# Patient Record
Sex: Male | Born: 1965 | Race: White | Hispanic: No | Marital: Single | Smoking: Never smoker
Health system: Southern US, Community
[De-identification: ages and names within clinical notes are randomized; demographics above are authoritative.]

## PROBLEM LIST (undated history)

## (undated) DIAGNOSIS — Z87442 Personal history of urinary calculi: Secondary | ICD-10-CM

## (undated) DIAGNOSIS — A5149 Other secondary syphilitic conditions: Secondary | ICD-10-CM

## (undated) DIAGNOSIS — I252 Old myocardial infarction: Secondary | ICD-10-CM

## (undated) DIAGNOSIS — E119 Type 2 diabetes mellitus without complications: Secondary | ICD-10-CM

## (undated) DIAGNOSIS — K829 Disease of gallbladder, unspecified: Secondary | ICD-10-CM

## (undated) DIAGNOSIS — Z21 Asymptomatic human immunodeficiency virus [HIV] infection status: Secondary | ICD-10-CM

## (undated) DIAGNOSIS — E782 Mixed hyperlipidemia: Secondary | ICD-10-CM

## (undated) DIAGNOSIS — E1110 Type 2 diabetes mellitus with ketoacidosis without coma: Secondary | ICD-10-CM

## (undated) DIAGNOSIS — A5274 Syphilis of liver and other viscera: Secondary | ICD-10-CM

## (undated) DIAGNOSIS — E785 Hyperlipidemia, unspecified: Secondary | ICD-10-CM

## (undated) DIAGNOSIS — I219 Acute myocardial infarction, unspecified: Secondary | ICD-10-CM

## (undated) DIAGNOSIS — R7989 Other specified abnormal findings of blood chemistry: Secondary | ICD-10-CM

## (undated) DIAGNOSIS — R945 Abnormal results of liver function studies: Secondary | ICD-10-CM

## (undated) DIAGNOSIS — B20 Human immunodeficiency virus [HIV] disease: Secondary | ICD-10-CM

## (undated) DIAGNOSIS — R9430 Abnormal result of cardiovascular function study, unspecified: Secondary | ICD-10-CM

## (undated) DIAGNOSIS — I2510 Atherosclerotic heart disease of native coronary artery without angina pectoris: Secondary | ICD-10-CM

## (undated) DIAGNOSIS — I10 Essential (primary) hypertension: Secondary | ICD-10-CM

## (undated) DIAGNOSIS — R079 Chest pain, unspecified: Secondary | ICD-10-CM

## (undated) DIAGNOSIS — E1165 Type 2 diabetes mellitus with hyperglycemia: Secondary | ICD-10-CM

## (undated) DIAGNOSIS — R112 Nausea with vomiting, unspecified: Secondary | ICD-10-CM

## (undated) DIAGNOSIS — R000 Tachycardia, unspecified: Secondary | ICD-10-CM

## (undated) HISTORY — DX: Hyperlipidemia, unspecified: E78.5

## (undated) HISTORY — DX: Nausea with vomiting, unspecified: R11.2

## (undated) HISTORY — DX: Acute myocardial infarction, unspecified: I21.9

## (undated) HISTORY — DX: Disease of gallbladder, unspecified: K82.9

## (undated) HISTORY — DX: Old myocardial infarction: I25.2

## (undated) HISTORY — DX: Human immunodeficiency virus (HIV) disease: B20

## (undated) HISTORY — DX: Essential (primary) hypertension: I10

## (undated) HISTORY — DX: Asymptomatic human immunodeficiency virus (hiv) infection status: Z21

## (undated) HISTORY — DX: Type 2 diabetes mellitus with hyperglycemia: E11.65

## (undated) HISTORY — DX: Type 2 diabetes mellitus without complications: E11.9

## (undated) HISTORY — DX: Chest pain, unspecified: R07.9

## (undated) HISTORY — DX: Other secondary syphilitic conditions: A51.49

## (undated) HISTORY — DX: Abnormal results of liver function studies: R94.5

## (undated) HISTORY — DX: Other specified abnormal findings of blood chemistry: R79.89

## (undated) HISTORY — DX: Mixed hyperlipidemia: E78.2

## (undated) HISTORY — DX: Atherosclerotic heart disease of native coronary artery without angina pectoris: I25.10

## (undated) HISTORY — DX: Tachycardia, unspecified: R00.0

## (undated) HISTORY — DX: Abnormal result of cardiovascular function study, unspecified: R94.30

## (undated) HISTORY — PX: HERNIA REPAIR: SHX51

## (undated) HISTORY — DX: Syphilis of liver and other viscera: A52.74

## (undated) HISTORY — PX: CORONARY ANGIOPLASTY WITH STENT PLACEMENT: SHX49

---

## 1998-03-05 ENCOUNTER — Emergency Department (HOSPITAL_COMMUNITY): Admission: EM | Admit: 1998-03-05 | Discharge: 1998-03-05 | Payer: Self-pay | Admitting: Emergency Medicine

## 2000-09-08 ENCOUNTER — Emergency Department (HOSPITAL_COMMUNITY): Admission: EM | Admit: 2000-09-08 | Discharge: 2000-09-08 | Payer: Self-pay | Admitting: *Deleted

## 2000-10-01 ENCOUNTER — Other Ambulatory Visit (HOSPITAL_COMMUNITY): Admission: RE | Admit: 2000-10-01 | Discharge: 2000-10-08 | Payer: Self-pay | Admitting: Psychiatry

## 2001-07-21 ENCOUNTER — Encounter: Payer: Self-pay | Admitting: Gastroenterology

## 2001-07-21 ENCOUNTER — Emergency Department (HOSPITAL_COMMUNITY): Admission: EM | Admit: 2001-07-21 | Discharge: 2001-07-21 | Payer: Self-pay | Admitting: *Deleted

## 2001-07-21 ENCOUNTER — Encounter: Payer: Self-pay | Admitting: Emergency Medicine

## 2001-07-21 ENCOUNTER — Ambulatory Visit (HOSPITAL_COMMUNITY): Admission: RE | Admit: 2001-07-21 | Discharge: 2001-07-21 | Payer: Self-pay | Admitting: Gastroenterology

## 2001-07-22 ENCOUNTER — Encounter (INDEPENDENT_AMBULATORY_CARE_PROVIDER_SITE_OTHER): Payer: Self-pay | Admitting: Specialist

## 2001-07-22 ENCOUNTER — Ambulatory Visit (HOSPITAL_COMMUNITY): Admission: RE | Admit: 2001-07-22 | Discharge: 2001-07-22 | Payer: Self-pay | Admitting: Gastroenterology

## 2001-07-23 HISTORY — PX: LAPAROSCOPIC INCISIONAL / UMBILICAL / VENTRAL HERNIA REPAIR: SUR789

## 2001-07-23 HISTORY — PX: CHOLECYSTECTOMY OPEN: SUR202

## 2001-08-09 ENCOUNTER — Ambulatory Visit (HOSPITAL_COMMUNITY): Admission: RE | Admit: 2001-08-09 | Discharge: 2001-08-09 | Payer: Self-pay | Admitting: Cardiology

## 2001-08-17 ENCOUNTER — Encounter: Payer: Self-pay | Admitting: General Surgery

## 2001-08-19 ENCOUNTER — Encounter: Payer: Self-pay | Admitting: General Surgery

## 2001-08-19 ENCOUNTER — Ambulatory Visit (HOSPITAL_COMMUNITY): Admission: RE | Admit: 2001-08-19 | Discharge: 2001-08-20 | Payer: Self-pay | Admitting: General Surgery

## 2001-08-19 ENCOUNTER — Encounter (INDEPENDENT_AMBULATORY_CARE_PROVIDER_SITE_OTHER): Payer: Self-pay | Admitting: *Deleted

## 2001-10-18 ENCOUNTER — Emergency Department (HOSPITAL_COMMUNITY): Admission: EM | Admit: 2001-10-18 | Discharge: 2001-10-18 | Payer: Self-pay | Admitting: Emergency Medicine

## 2001-10-18 ENCOUNTER — Encounter: Payer: Self-pay | Admitting: Internal Medicine

## 2001-10-19 ENCOUNTER — Encounter: Payer: Self-pay | Admitting: Internal Medicine

## 2001-10-19 ENCOUNTER — Ambulatory Visit (HOSPITAL_COMMUNITY): Admission: RE | Admit: 2001-10-19 | Discharge: 2001-10-19 | Payer: Self-pay | Admitting: Internal Medicine

## 2002-12-15 ENCOUNTER — Encounter: Payer: Self-pay | Admitting: Emergency Medicine

## 2002-12-15 ENCOUNTER — Inpatient Hospital Stay (HOSPITAL_COMMUNITY): Admission: EM | Admit: 2002-12-15 | Discharge: 2002-12-17 | Payer: Self-pay | Admitting: Emergency Medicine

## 2002-12-16 ENCOUNTER — Encounter: Payer: Self-pay | Admitting: Internal Medicine

## 2004-03-28 ENCOUNTER — Ambulatory Visit: Payer: Self-pay | Admitting: Internal Medicine

## 2004-04-09 ENCOUNTER — Emergency Department (HOSPITAL_COMMUNITY): Admission: EM | Admit: 2004-04-09 | Discharge: 2004-04-09 | Payer: Self-pay

## 2004-04-24 ENCOUNTER — Ambulatory Visit: Payer: Self-pay | Admitting: Internal Medicine

## 2004-06-06 ENCOUNTER — Ambulatory Visit: Payer: Self-pay | Admitting: Internal Medicine

## 2004-06-30 ENCOUNTER — Observation Stay (HOSPITAL_COMMUNITY): Admission: AD | Admit: 2004-06-30 | Discharge: 2004-07-01 | Payer: Self-pay | Admitting: Internal Medicine

## 2004-06-30 ENCOUNTER — Ambulatory Visit: Payer: Self-pay | Admitting: Internal Medicine

## 2004-08-07 ENCOUNTER — Ambulatory Visit: Payer: Self-pay | Admitting: Internal Medicine

## 2004-09-01 ENCOUNTER — Inpatient Hospital Stay (HOSPITAL_COMMUNITY): Admission: EM | Admit: 2004-09-01 | Discharge: 2004-09-03 | Payer: Self-pay | Admitting: Emergency Medicine

## 2004-09-12 ENCOUNTER — Ambulatory Visit: Payer: Self-pay | Admitting: Internal Medicine

## 2004-09-23 ENCOUNTER — Emergency Department (HOSPITAL_COMMUNITY): Admission: EM | Admit: 2004-09-23 | Discharge: 2004-09-23 | Payer: Self-pay | Admitting: Emergency Medicine

## 2004-10-09 ENCOUNTER — Ambulatory Visit: Payer: Self-pay | Admitting: Internal Medicine

## 2004-12-04 ENCOUNTER — Ambulatory Visit: Payer: Self-pay | Admitting: Internal Medicine

## 2005-01-05 ENCOUNTER — Ambulatory Visit: Payer: Self-pay | Admitting: Internal Medicine

## 2005-01-08 ENCOUNTER — Inpatient Hospital Stay (HOSPITAL_COMMUNITY): Admission: EM | Admit: 2005-01-08 | Discharge: 2005-01-09 | Payer: Self-pay | Admitting: Emergency Medicine

## 2005-03-12 ENCOUNTER — Ambulatory Visit: Payer: Self-pay | Admitting: Internal Medicine

## 2005-04-09 ENCOUNTER — Inpatient Hospital Stay (HOSPITAL_COMMUNITY): Admission: EM | Admit: 2005-04-09 | Discharge: 2005-04-10 | Payer: Self-pay | Admitting: Emergency Medicine

## 2005-06-13 ENCOUNTER — Emergency Department (HOSPITAL_COMMUNITY): Admission: EM | Admit: 2005-06-13 | Discharge: 2005-06-13 | Payer: Self-pay | Admitting: Emergency Medicine

## 2005-06-14 ENCOUNTER — Inpatient Hospital Stay (HOSPITAL_COMMUNITY): Admission: EM | Admit: 2005-06-14 | Discharge: 2005-06-16 | Payer: Self-pay | Admitting: Emergency Medicine

## 2005-07-21 ENCOUNTER — Ambulatory Visit: Payer: Self-pay | Admitting: "Endocrinology

## 2005-07-22 ENCOUNTER — Ambulatory Visit (HOSPITAL_COMMUNITY): Admission: RE | Admit: 2005-07-22 | Discharge: 2005-07-22 | Payer: Self-pay | Admitting: *Deleted

## 2005-10-01 ENCOUNTER — Ambulatory Visit: Payer: Self-pay | Admitting: "Endocrinology

## 2006-03-21 ENCOUNTER — Inpatient Hospital Stay (HOSPITAL_COMMUNITY): Admission: EM | Admit: 2006-03-21 | Discharge: 2006-03-23 | Payer: Self-pay | Admitting: Emergency Medicine

## 2006-04-08 ENCOUNTER — Inpatient Hospital Stay (HOSPITAL_COMMUNITY): Admission: EM | Admit: 2006-04-08 | Discharge: 2006-04-09 | Payer: Self-pay | Admitting: Emergency Medicine

## 2006-04-08 ENCOUNTER — Ambulatory Visit: Payer: Self-pay | Admitting: Internal Medicine

## 2006-04-27 ENCOUNTER — Ambulatory Visit: Payer: Self-pay | Admitting: Cardiology

## 2006-04-30 ENCOUNTER — Ambulatory Visit (HOSPITAL_COMMUNITY): Admission: RE | Admit: 2006-04-30 | Discharge: 2006-04-30 | Payer: Self-pay | Admitting: Internal Medicine

## 2006-05-04 ENCOUNTER — Ambulatory Visit: Payer: Self-pay | Admitting: Internal Medicine

## 2006-05-07 ENCOUNTER — Ambulatory Visit: Payer: Self-pay

## 2006-05-11 ENCOUNTER — Ambulatory Visit: Payer: Self-pay | Admitting: Cardiology

## 2006-05-12 ENCOUNTER — Ambulatory Visit (HOSPITAL_COMMUNITY): Admission: RE | Admit: 2006-05-12 | Discharge: 2006-05-12 | Payer: Self-pay | Admitting: Internal Medicine

## 2006-05-13 ENCOUNTER — Ambulatory Visit: Payer: Self-pay | Admitting: Internal Medicine

## 2006-05-14 ENCOUNTER — Ambulatory Visit (HOSPITAL_COMMUNITY): Admission: RE | Admit: 2006-05-14 | Discharge: 2006-05-14 | Payer: Self-pay | Admitting: Internal Medicine

## 2006-05-19 ENCOUNTER — Encounter (INDEPENDENT_AMBULATORY_CARE_PROVIDER_SITE_OTHER): Payer: Self-pay | Admitting: Specialist

## 2006-05-19 ENCOUNTER — Ambulatory Visit (HOSPITAL_COMMUNITY): Admission: RE | Admit: 2006-05-19 | Discharge: 2006-05-19 | Payer: Self-pay | Admitting: Internal Medicine

## 2006-05-26 ENCOUNTER — Emergency Department (HOSPITAL_COMMUNITY): Admission: EM | Admit: 2006-05-26 | Discharge: 2006-05-26 | Payer: Self-pay | Admitting: Emergency Medicine

## 2006-08-10 ENCOUNTER — Ambulatory Visit: Payer: Self-pay | Admitting: Cardiology

## 2006-12-30 ENCOUNTER — Ambulatory Visit (HOSPITAL_COMMUNITY): Admission: RE | Admit: 2006-12-30 | Discharge: 2006-12-30 | Payer: Self-pay | Admitting: Internal Medicine

## 2007-11-08 ENCOUNTER — Ambulatory Visit: Payer: Self-pay | Admitting: Cardiology

## 2007-11-08 ENCOUNTER — Encounter: Payer: Self-pay | Admitting: Emergency Medicine

## 2007-11-08 ENCOUNTER — Inpatient Hospital Stay (HOSPITAL_COMMUNITY): Admission: EM | Admit: 2007-11-08 | Discharge: 2007-11-09 | Payer: Self-pay | Admitting: Emergency Medicine

## 2007-11-24 ENCOUNTER — Ambulatory Visit: Payer: Self-pay | Admitting: Cardiology

## 2007-12-30 IMAGING — CT CT CHEST W/O CM
1 series · 16 of 33 positions shown, 20 images · IV contrast (agent unspecified)
Comparison: none

CLINICAL DATA: Follow-up lung nodules. 
CHEST CT WITHOUT CONTRAST:
TECHNIQUE: Multidetector CT imaging of the chest was performed following the standard protocol without IV contrast.

[Series 2: chestroutine 5.0 b40f · axial · 0.77mm/px · z∈[-330,-45]mm · 16 of 63 slices shown, 20 images]
[im 3/63  mediastinal]
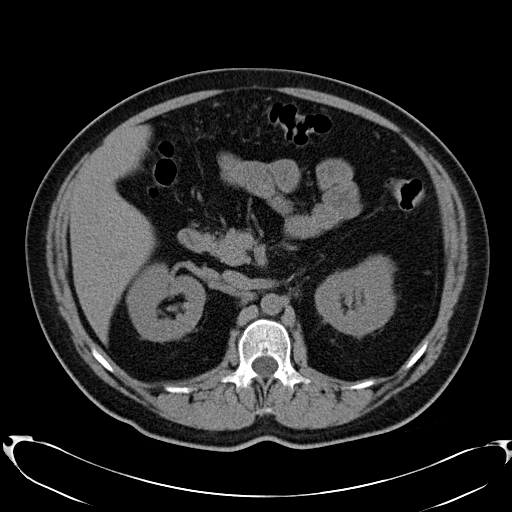
[im 3/63  lung]
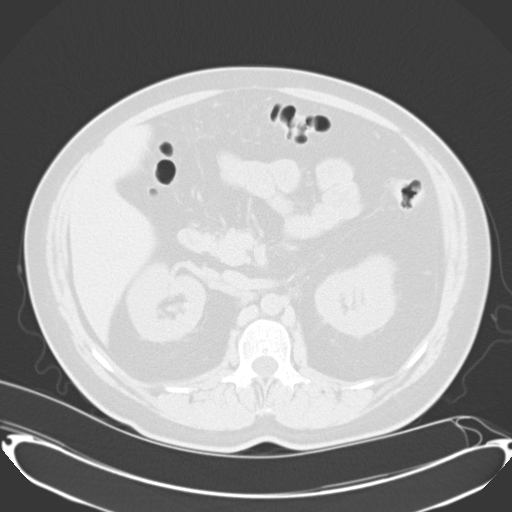
[im 7/63  lung]
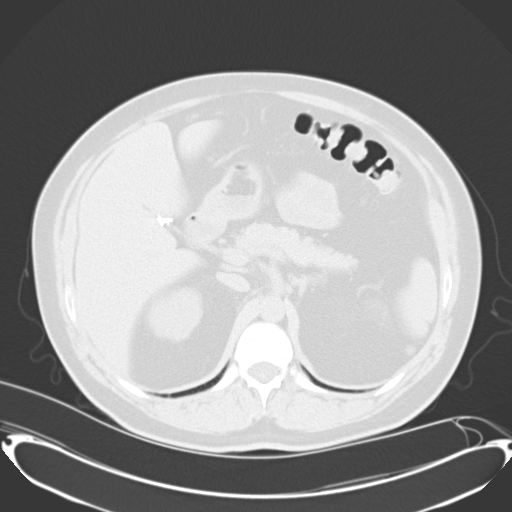
[im 12/63  lung]
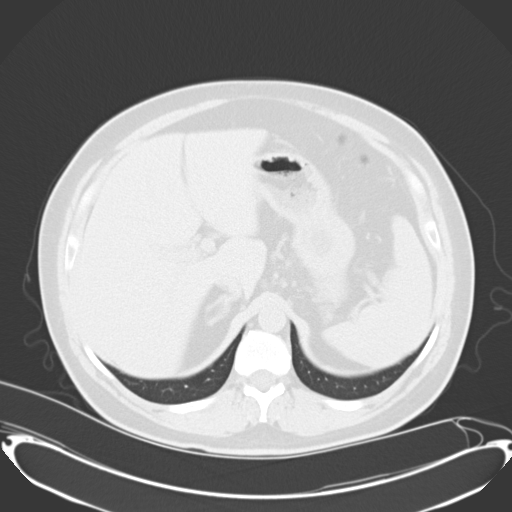
[im 14/63  lung]
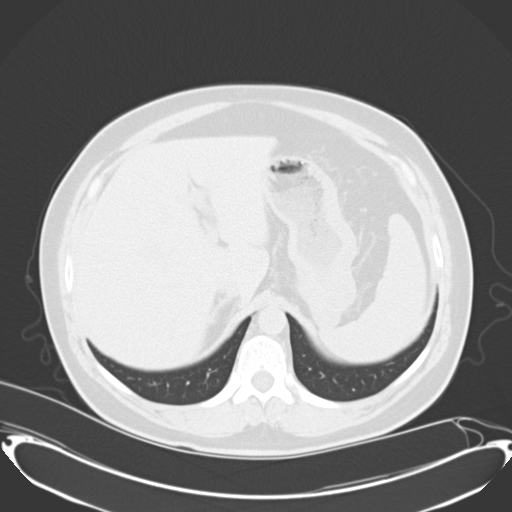
[im 19/63  mediastinal]
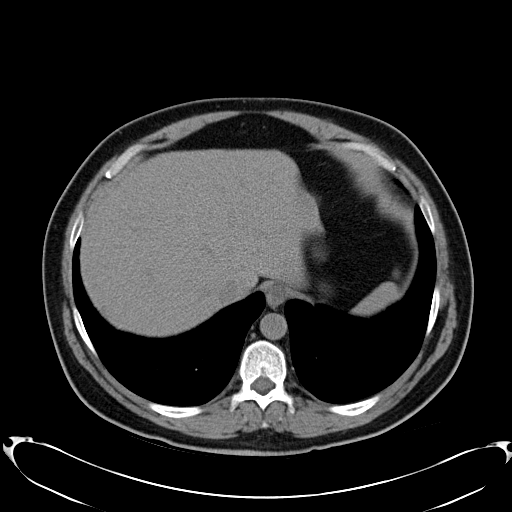
[im 19/63  lung]
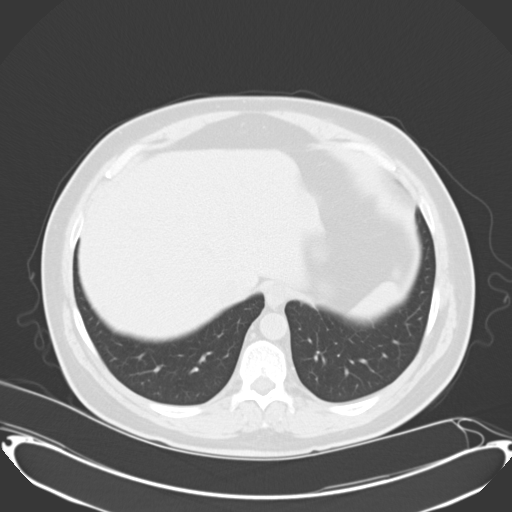
[im 23/63  lung]
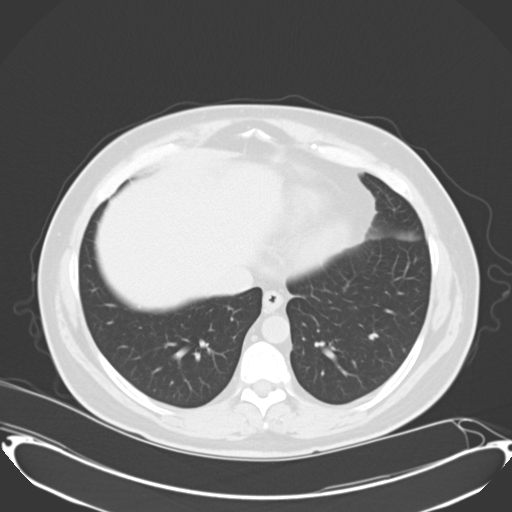
[im 26/63  lung]
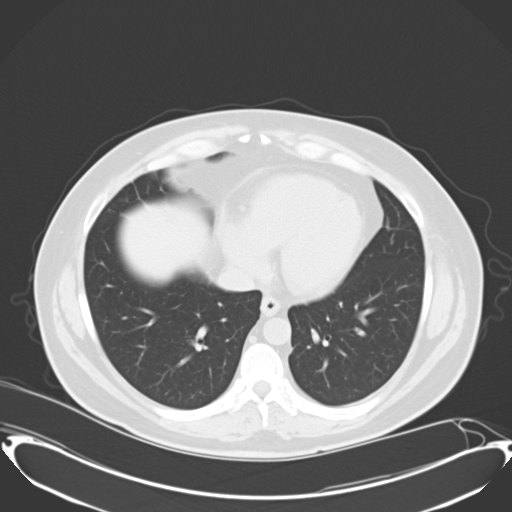
[im 30/63  lung]
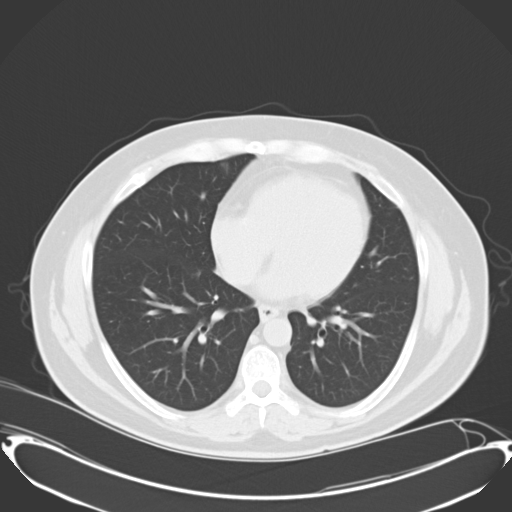
[im 34/63  mediastinal]
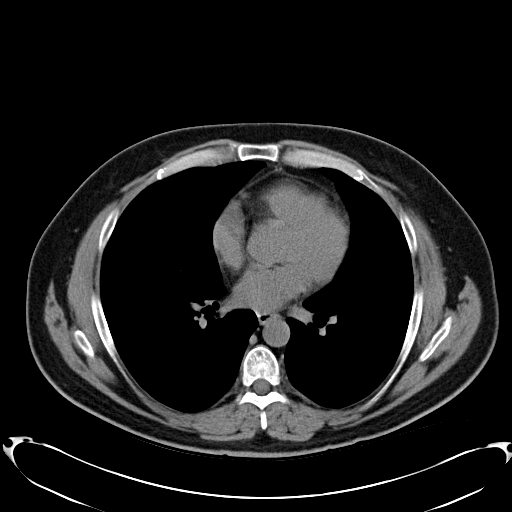
[im 34/63  lung]
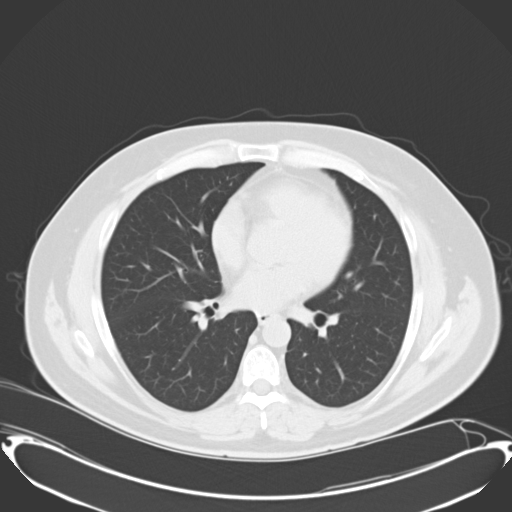
[im 37/63  lung]
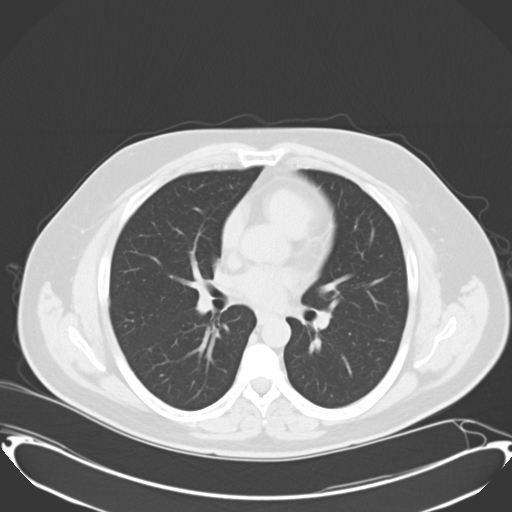
[im 40/63  lung]
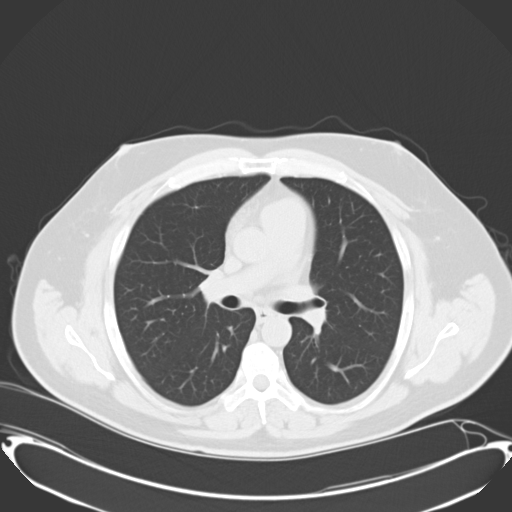
[im 44/63  lung]
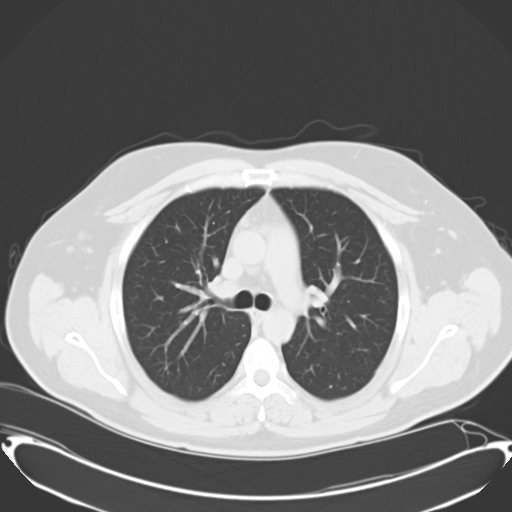
[im 49/63  mediastinal]
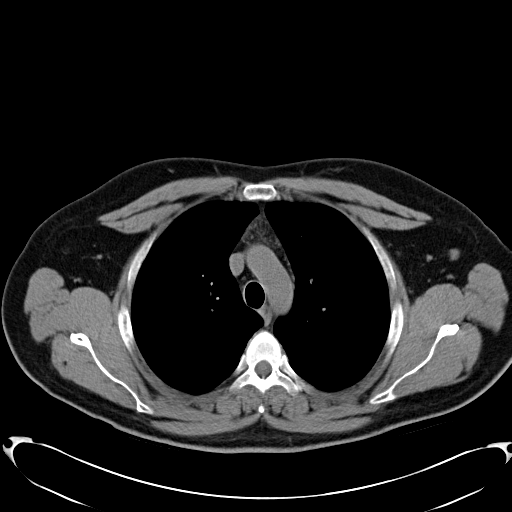
[im 49/63  lung]
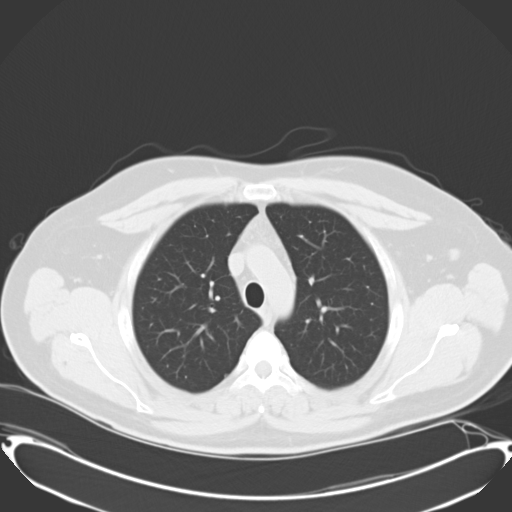
[im 51/63  lung]
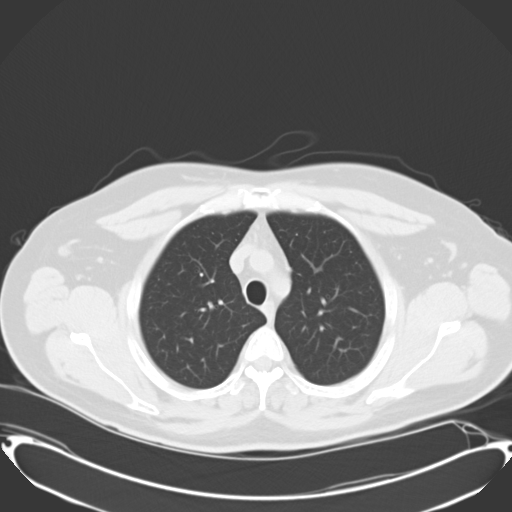
[im 56/63  lung]
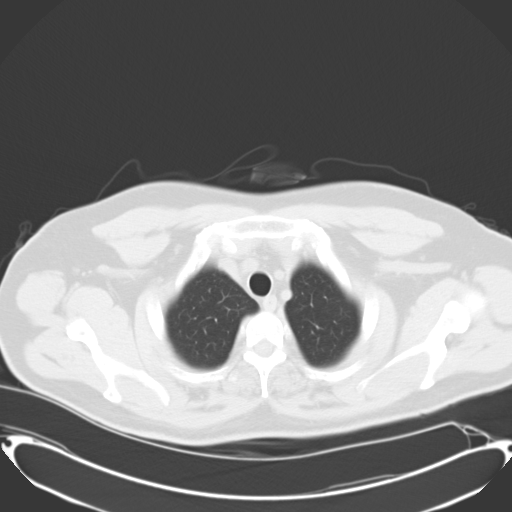
[im 60/63  lung]
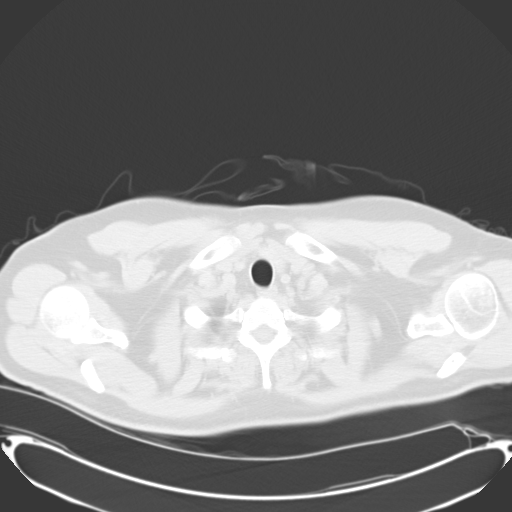

[16 of 33 positions shown; findings below may reference images not displayed]

FINDINGS: The prior exam revealed nodules at both lung bases.  Today?s exam reveals no pulmonary nodules.  The nodules have resolved.  No evidence of adenopathy.  The prior exam revealed enlarged bilateral axillary lymph nodes which have decreased in size.  There are slightly prominent sized lymph nodes in the axilla on today?s exam with the largest in the right axilla measuring 1 cm in diameter.  Mild ill-defined increased density in the anterior mediastinal fat compatible with involuted thymus gland.
IMPRESSION: Resolved pulmonary nodules.

## 2008-03-09 ENCOUNTER — Ambulatory Visit: Payer: Self-pay | Admitting: Cardiology

## 2008-11-02 ENCOUNTER — Encounter: Payer: Self-pay | Admitting: Cardiology

## 2009-02-22 ENCOUNTER — Encounter: Payer: Self-pay | Admitting: Cardiology

## 2009-03-24 ENCOUNTER — Encounter: Payer: Self-pay | Admitting: Cardiology

## 2009-03-25 ENCOUNTER — Ambulatory Visit: Payer: Self-pay | Admitting: Cardiology

## 2009-03-30 ENCOUNTER — Encounter (INDEPENDENT_AMBULATORY_CARE_PROVIDER_SITE_OTHER): Payer: Self-pay | Admitting: *Deleted

## 2009-03-30 LAB — CONVERTED CEMR LAB
Cholesterol: 168 mg/dL
HDL: 40 mg/dL
Hgb A1c MFr Bld: 8.5 %
LDL Cholesterol: 88 mg/dL
Triglycerides: 202 mg/dL

## 2009-04-03 ENCOUNTER — Telehealth: Payer: Self-pay | Admitting: Cardiology

## 2009-04-04 ENCOUNTER — Encounter (INDEPENDENT_AMBULATORY_CARE_PROVIDER_SITE_OTHER): Payer: Self-pay | Admitting: *Deleted

## 2009-08-08 ENCOUNTER — Encounter (INDEPENDENT_AMBULATORY_CARE_PROVIDER_SITE_OTHER): Payer: Self-pay | Admitting: *Deleted

## 2009-10-17 ENCOUNTER — Encounter: Payer: Self-pay | Admitting: Cardiology

## 2009-10-31 ENCOUNTER — Ambulatory Visit: Payer: Self-pay | Admitting: Cardiology

## 2009-12-02 ENCOUNTER — Ambulatory Visit (HOSPITAL_COMMUNITY): Admission: RE | Admit: 2009-12-02 | Discharge: 2009-12-02 | Payer: Self-pay | Admitting: Internal Medicine

## 2010-02-07 ENCOUNTER — Encounter: Payer: Self-pay | Admitting: Cardiology

## 2010-06-26 NOTE — Letter (Signed)
Summary: [facility] Medical Visit Note  [facility] Medical Visit Note   Imported By: Roderic Ovens 03/27/2009 13:54:34  _____________________________________________________________________  External Attachment:    Type:   Image     Comment:   External Document

## 2010-06-26 NOTE — Miscellaneous (Signed)
Summary: HGBA1C,LIPIDS 03/30/2009  Clinical Lists Changes  Observations: Added new observation of LDL: 88 mg/dL (16/02/9603 54:09) Added new observation of HDL: 40 mg/dL (81/19/1478 29:56) Added new observation of TRIGLYC TOT: 202 mg/dL (21/30/8657 84:69) Added new observation of CHOLESTEROL: 168 mg/dL (62/95/2841 32:44) Added new observation of HGBA1C: 8.5 % (03/30/2009 16:59)

## 2010-06-26 NOTE — Consult Note (Signed)
Summary: [facility]  [facility]   Imported By: Marylou Mccoy 11/28/2009 15:48:56  _____________________________________________________________________  External Attachment:    Type:   Image     Comment:   External Document

## 2010-06-26 NOTE — Miscellaneous (Signed)
  Clinical Lists Changes  Observations: Added new observation of PAST MED HX: Diabetes mellitus, type II   Altheimer Hypertension HIV  virus LFTs  elevated CAD..  DES.  /  ... Later (10/2007) intervention for in-stent restenosis (03/24/2009 14:38) Added new observation of REFERRING MD: M. Altheimer, MD / Melven Sartorius (03/24/2009 14:38) Added new observation of PRIMARY MD: Carylon Perches, MD (03/24/2009 14:38)       Past History:  Past Medical History: Diabetes mellitus, type II   Altheimer Hypertension HIV  virus LFTs  elevated CAD..  DES.  /  ... Later (10/2007) intervention for in-stent restenosis

## 2010-06-26 NOTE — Assessment & Plan Note (Signed)
Summary: f23m   Visit Type:  Follow-up Referring Provider:  M. Altheimer, MD / Melven Sartorius Primary Provider:  Carylon Perches, MD  CC:  CAD.  History of Present Illness: The patient is seen for followup of coronary artery disease.  He feels well.  He had in-stent restenosis and was treated in June, 2009.  He has not had any recurrent chest pain symptoms.he is going about full activities.  He does not have chest pain or shortness of breath.  He does not feel any significant palpitations.  Current Medications (verified): 1)  Lantus 100 Unit/ml  Soln (Insulin Glargine) 2)  Plavix 75 Mg Tabs (Clopidogrel Bisulfate) .... Take One Tablet By Mouth Daily 3)  Lisinopril 10 Mg Tabs (Lisinopril) .... Take One Tablet By Mouth Daily 4)  Metoprolol Tartrate 25 Mg Tabs (Metoprolol Tartrate) .... Take One Tablet By Mouth Twice A Day 5)  Novolog Mix 70/30 70-30 % Susp (Insulin Aspart Prot & Aspart) .... As Directed 6)  Atripla 600-200-300 Mg Tabs (Efavirenz-Emtricitab-Tenofovir) .... Take 1 By Mouth Once Daily 7)  Aspirin 81 Mg Tbec (Aspirin) .... Take One Tablet By Mouth Daily 8)  Lipitor 80 Mg Tabs (Atorvastatin Calcium) .... Take One Tablet By Mouth Daily. 9)  Chlorthalidone 25 Mg Tabs (Chlorthalidone) .... 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 RPR reverted to negative CAD..  DES.  /  ... Later (10/2007) intervention for in-stent restenosis  Review of Systems       Patient denies fever, chills, sweats, headache, rash, change in vision, change in hearing, nausea vomiting, urinary symptoms, musculoskeletal problems, psychological problems.  All other systems are reviewed and are  negative.  Vital Signs:  Patient profile:   45 year old male Height:      66 inches Weight:      192.75 pounds BMI:     31.22 Pulse rate:   120 / minute BP sitting:   138 / 80  (left arm) Cuff size:   regular  Vitals Entered By: Stanton Kidney, EMT-P (March 25, 2009 3:05 PM)  Physical Exam  General:  The patient is quite stable today. Head:  head is atraumatic and normocephalic. Eyes:  no xanthelasma. Neck:  no jugular venous distention. Chest Wall:  no chest wall tenderness. Lungs:  lungs are clear.  Respiratory effort is nonlabored. Heart:  cardiac exam reveals S1-S2.  No clicks or significant murmurs.  There is an increased resting heart rate of 120. Abdomen:  abdomen is soft. Msk:  no musculoskeletal deformities. Extremities:  no peripheral edema. Skin:  no skin rashes. Psych:  patient is oriented to person time and place affect is normal.   Impression & Recommendations:  Problem # 1:  SINUS TACHYCARDIA (ICD-427.89)  The patient's heart rate is increased today.  He does not feel this.  EKG is done and it reveals sinus tachycardia at a rate of 120.  He is not had sinus tachycardia on a regular basis in the past.  He has no symptoms.  TSH will be done to be careful.  He we'll document his heart rate at home and be in touch with [name].  No other workup at this point.  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  Patient is very stable in this regard.  Problem # 3:  CAD (ICD-414.00)  The patient is not having any significant symptoms.  EKG was done today and reviewed by me.  Other than sinus tachycardia there is no significant abnormality.  No other workup at this time.  Orders: EKG w/ Interpretation (93000)  Problem # 4:  HYPERTENSION (ICD-401.9) Blood pressure is under good control.  No change.  Patient Instructions: 1)  Please check your pulse and give me a call with  some of the readings. 2)  Follow up in 6 months

## 2010-06-26 NOTE — Letter (Signed)
Summary: Appointment - Reminder 2  [facility], Main Office  [address]   [place], [place] [address]   Phone: [phone]  Fax: [phone]     August 08, 2009 MRN: [id number]   [name] [address] [place], [place]  [address]   Dear Mr. ENFIELD,  Our records indicate that it is time to schedule a follow-up appointment with Dr. Myrtis Ser. It is very important that we reach you to schedule this appointment. We look forward to participating in your health care needs. Please contact us at the number listed above at your earliest convenience to schedule your appointment.  If you are unable to make an appointment at this time, give [name] a call so we can update our records.     Sincerely,   Migdalia Dk [facility] Scheduling Team

## 2010-06-26 NOTE — Letter (Signed)
Summary: [facility] Medical Return Visit   [facility] Medical Return Visit   Imported By: Roderic Ovens 03/24/2010 10:20:16  _____________________________________________________________________  External Attachment:    Type:   Image     Comment:   External Document

## 2010-06-26 NOTE — Assessment & Plan Note (Signed)
Summary: f28m  Medications Added METOPROLOL SUCCINATE 100 MG XR24H-TAB (METOPROLOL SUCCINATE) Take one tablet by mouth daily      Allergies Added: NKDA  Visit Type:  Follow-up Referring Provider:  M. Altheimer, MD / Melven Sartorius Primary Provider:  Carylon Perches, MD  CC:  CAD.  History of Present Illness: Patient is seen for followup of coronary artery disease.  Also he's had some mild resting sinus tachycardia.  I saw him last November, 2010.  Is not having any chest pain.  At that time we continued his metoprolol.  Current Medications (verified): 1)  Lantus 100 Unit/ml  Soln (Insulin Glargine) 2)  Plavix 75 Mg Tabs (Clopidogrel Bisulfate) .... Take One Tablet By Mouth Daily 3)  Lisinopril 10 Mg Tabs (Lisinopril) .... Take One Tablet By Mouth Daily 4)  Metoprolol Tartrate 25 Mg Tabs (Metoprolol Tartrate) .... Take One Tablet By Mouth Twice A Day 5)  Novolog Mix 70/30 70-30 % Susp (Insulin Aspart Prot & Aspart) .... As Directed 6)  Atripla 600-200-300 Mg Tabs (Efavirenz-Emtricitab-Tenofovir) .... Take 1 By Mouth Once Daily 7)  Aspirin 81 Mg Tbec (Aspirin) .... Take One Tablet By Mouth Daily 8)  Lipitor 80 Mg Tabs (Atorvastatin Calcium) .... Take One Tablet By Mouth Daily. 9)  Chlorthalidone 25 Mg Tabs (Chlorthalidone) .... 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 RPR reverted to negative CAD..  DES.  /  ... Later (10/2007) intervention for in-stent restenosis EF 60%... catheterization... June, 2009 Sinus tachycardia..... mild at rest.... October, 2010  Review of Systems       Patient denies fever, chills, headache, sweats, rash, change in vision, change in hearing,  chest pain, cough, nausea vomiting, urinary symptoms.  All other systems are reviewed and are negative.  Vital Signs:  Patient profile:   45 year old male Height:      66 inches Weight:      194 pounds BMI:     31.43 Pulse rate:   94 / minute Pulse rhythm:   regular BP sitting:   118 / 84  (left arm) Cuff size:   regular  Vitals Entered By: Judithe Modest CMA (October 31, 2009 10:12 AM)  Physical Exam  General:  patient is stable in general. Head:  head is atraumatic. Eyes:  no xanthelasma. Neck:  no jugular distention. Chest Wall:  no chest wall tenderness. Lungs:  lungs are clear.  Respiratory effort is nonlabored. Heart:  cardiac exam reveals S1-S2.  No clicks or significant murmurs.  Heart rate is mildly increased. Abdomen:  abdomen is soft there Msk:  no musculoskeletal deformities. Extremities:  no peripheral edema. Skin:  no skin rashes. Psych:  patient is oriented to person time and place.  Affect is normal.   Impression & Recommendations:  Problem # 1:  SINUS TACHYCARDIA (ICD-427.89)  The patient has mild relative increase in sinus rate.  He is on low-dose beta-blockade.  I will change it to once a day preparation and increase the dose of metoprolol succinate 100 mg daily.  No other workup.  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  The patient's overall status is stable.  Problem # 3:  CAD (ICD-414.00)  His updated medication list for this problem includes:    Plavix 75 Mg Tabs (Clopidogrel bisulfate) .[name]... Take one tablet by mouth daily    Lisinopril 10 Mg Tabs (Lisinopril) .[name]... Take one tablet by mouth daily    Metoprolol Succinate 100 Mg Xr24h-tab (Metoprolol succinate) .[name]... Take one tablet by mouth daily    Aspirin 81 Mg Tbec (Aspirin) .[name]... Take one tablet by mouth daily Coronary disease is stable.  He's not having  any significant symptoms.  No change in therapy.  His beta blocker dose will be increased for his sinus tachycardia.  EKG done today reviewed by me.  There is no significant change.  Orders: EKG w/ Interpretation (93000)  Problem # 4:  HYPERTENSION (ICD-401.9)  His updated medication list for this problem includes:    Lisinopril 10 Mg Tabs (Lisinopril) .[name]... Take one tablet by mouth daily    Metoprolol Succinate 100 Mg Xr24h-tab (Metoprolol succinate) .[name]... Take one tablet by mouth daily    Aspirin 81 Mg Tbec (Aspirin) .[name]... Take one tablet by mouth daily    Chlorthalidone 25 Mg Tabs (Chlorthalidone) .[name]... Take 1 by mouth in the evening Blood pressure today is controlled.  No change in therapy.  Patient Instructions: 1)  Change Metoprolol Tartrate to Metroprolol Succinate and also increase dose to 100mg  daily  2)  Your physician wants you to follow-up in:  6 months.  You will receive a reminder letter in the mail two months in advance. If you don't receive a letter, please call our office to schedule the follow-up appointment. Prescriptions: METOPROLOL SUCCINATE 100 MG XR24H-TAB (METOPROLOL SUCCINATE) Take one tablet by mouth daily  #30 x 6   Entered by:   Meredith Staggers, RN   Authorized by:   Talitha Givens, MD, [facility]   Signed by:   Meredith Staggers, RN on 10/31/2009   Method used:   Electronically to        [facility]* (retail)       [address]       [place], [place]  [address]       Ph: [phone]       Fax: [phone]   RxID:   [id number]

## 2010-06-26 NOTE — Progress Notes (Signed)
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 RPR reverted to negative CAD..  DES.  /  ... Later (10/2007) intervention for in-stent restenosis EF 60%... catheterization... June, 2009 Sinus tachycardia..... mild at rest.... October, 2010    Ask Aseem to check heart rate a few more times and call us.  Talitha Givens, MD, [facility]  April 08, 2009 8:45 AM    Left message to call back Meredith Staggers, RN  April 08, 2009 3:34 PM   pt aware, will call back w/some more readings Meredith Staggers, RN  April 08, 2009 3:45 PM

## 2010-06-26 NOTE — Letter (Signed)
Summary: [facility] Medical Note  [facility] Medical Note   Imported By: Roderic Ovens 11/30/2008 13:44:00  _____________________________________________________________________  External Attachment:    Type:   Image     Comment:   External Document

## 2010-10-07 NOTE — Assessment & Plan Note (Signed)
[facility]                            CARDIOLOGY OFFICE NOTE   [name], [name]                        MRN:          [id number]  DATE:11/24/2007                            DOB:          [birth year]    HISTORY:  [name] is here for followup.  I had seen him last in March  2008.  He came into the hospital recently with chest heaviness.  Originally, his ischemia was felt to be indigestion.  He came in with  chest heaviness.  He underwent catheterization.  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  He received a PROMUS drug-eluting stent,  and he did extremely well.  He went home and he has had no recurrence  since then.  He is going about full activities.   PAST MEDICAL HISTORY:   ALLERGIES:  No known drug allergies.   MEDICATIONS:  1. Plavix 75.  2. Lisinopril 10.  3. Metoprolol 25 b.i.d.  4. Lantus.  5. NovoLog.  6. Atripla 600/200/300.  7. Aspirin 325.  8. Lipitor 80.   OTHER MEDICAL PROBLEMS:  See the list below.   REVIEW OF SYSTEMS:  He is actually doing well.  His review of systems is  negative.   PHYSICAL EXAMINATION:  VITAL SIGNS:  Blood pressure is 112/78.  Weight  is 190 pounds.  He has gained significant weight over time and he needs  to lose weight and we talked about this.  Pulse is 85.  GENERAL:  The patient is oriented to person, time and place.  Affect is  normal.  HEENT:  No xanthelasma.  He has normal extraocular motion.  There are no  carotid bruits.  There is no jugular venous distention.  LUNGS:  Clear.  Respiratory effort is not labored.  CARDIAC:  S1 with an S2.  There are no clicks or significant murmurs.  ABDOMEN:  Soft.  EXTREMITIES:  There is no peripheral edema.  His cath site is fully  healed.   PROBLEMS:  1. Coronary artery disease.  He has undergone recent restenting of in-      stent restenosis with a drug-eluting stent.  He is  stable.  I will      see him back in 3 months.  He is on the appropriate medications.  2. Elevated liver function studies.  This improved eventually, and it      was felt that this was most probably related to issues related to      human immunodeficiency virus.  3. Diabetes, treated.  4. Human immunodeficiency virus, virus positive.  This is being      treated with Atripla, which is a triple combination medicine.   PLAN:  [name] is stable.  I will see him back in 3 months.     Luis Abed, MD, [facility]  Electronically Signed    JDK/MedQ  DD: 11/24/2007  DT: 11/25/2007  Job #: [id number]   cc:   Kingsley Callander. Ouida Sills, MD  Veverly Fells. Altheimer, M.D.  Prescott Gum,  MD

## 2010-10-07 NOTE — Assessment & Plan Note (Signed)
[facility]                            CARDIOLOGY OFFICE NOTE   [name], [name]                        MRN:          [id number]  DATE:03/09/2008                            DOB:          11/23/1965    [name] is stable.  I am following him for his coronary disease.  He had  an intervention in June 2009.  He has not had any recurrent chest pain.  He is on all of his medicines and he is stable.   PAST MEDICAL HISTORY:   ALLERGIES:  No known drug allergies.   MEDICATIONS:  Plavix, lisinopril, metoprolol, Lantus, NovoLog, Atripla,  aspirin, and Lipitor.   OTHER MEDICAL PROBLEMS:  See the list below.   REVIEW OF SYSTEMS:  He is not having any musculoskeletal problems.  He  has no nausea or vomiting.  There are no headaches or temperature.  His  review of systems otherwise negative.   PHYSICAL EXAMINATION:  VITAL SIGNS:  Blood pressure is 124/88.  Pulse is  97.  The patient did rush from [facility] and the [employer] parking  lot.  His heart rate is running little higher than I would like to see.  He does feel slightly whelm down from the beta-blocker and therefore I  will not increase the dose, but we will follow this issue.  HEENT:  No xanthelasma.  He has normal extraocular motion.  There are no  carotid bruits.  There is no jugular venous distention.  Lungs are clear.  Respiratory effort is not labored.  CARDIAC:  S1 with an S2.  There are no clicks or significant murmurs.  ABDOMEN:  Soft.  He has no peripheral edema.   No labs were done today.   PROBLEMS:  1. Coronary disease with stenting for in-stent restenosis with the      drug-eluting stent.  No change in his medications.  2. Elevated liver function studies, this improved eventually.  3. Diabetes, treated.  4. Human immunodeficiency virus.  He is on medications.   The patient is stable.  I am not changing his meds, but I do want to see  him back again in 3 months.     Luis Abed, MD, [facility]  Electronically Signed    JDK/MedQ  DD: 03/09/2008  DT: 03/10/2008  Job #: [id number]   cc:   Kingsley Callander. Ouida Sills, MD  Veverly Fells. Altheimer, M.D.  Melven Sartorius

## 2010-10-07 NOTE — H&P (Signed)
NAMELYELL, CLUGSTON NO.:  [id number]   MEDICAL RECORD NO.:  [id number]          PATIENT TYPE:  INP   LOCATION:  6525                         FACILITY:  [facility]   PHYSICIAN:  Jonelle Sidle, MD DATE OF BIRTH:  01-05-1966   DATE OF ADMISSION:  11/08/2007  DATE OF DISCHARGE:                              HISTORY & PHYSICAL   PRIMARY CARDIOLOGIST:  Luis Abed, MD, [facility]   PRIMARY CARE PHYSICIAN:  Kingsley Callander. 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   PROBLEMS:  1. Unstable angina/coronary artery disease.      a.     On April 08, 2006, cardiac catheterization/percutaneous       coronary intervention.  Left main was normal.  Left anterior       descending with minor irregularities.  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  There was a 80%       stenosis that was successfully treated with a 2.5 x 20 Taxus drug-       eluting stent.  The right coronary artery was normal.  Ejection       fraction was normal.  2. Hyperlipidemia.  3. Type 1 diabetes mellitus, diagnosed in his late 30s.  Also, history      of diabetic ketoacidosis.  4. Hypertension.  5. Chronically elevated LFTs.  6. Human immunodeficiency virus, followed by Dr. Melven Sartorius at      [facility].  7. Status post laparoscopic cholecystectomy.  8. Status post umbilical hernia repair.   HISTORY OF PRESENT ILLNESS:  A 45 year old single Caucasian male with  prior history of CAD, status post PCI and drug-eluting stent placement  to an anomalous left circumflex off the right coronary cusp performed in  2007.  He has done well since then but yesterday afternoon approximately  at 4:30 p.m. he developed 5-6/10 substernal chest tightness with  radiation to the right chest associated with shortness  breath and mild  nausea, all similar symptoms to prior angina.  Symptoms would last about  5 to 10 minutes at rest and then resolve spontaneously but recurred  frequently throughout the evening and night.  He had a very restless  sleep and this morning came in to work and a co-worker advised him to  present to the ED..  Currently, he continues to complain of 2/10 chest  pain that is slightly more tight in nature with deep breathing.  There  is no worsening with palpation or upper extremity movement.   ALLERGIES:  No known drug allergies.   HOME MEDICATIONS:  1. Aspirin 325 mg daily.  2. Lantus 60 units nightly.  3. Lipitor 80 mg daily.  4. NovoLog sliding scale insulin t.i.d. a.c.  5. Atripla 600/200/300 1 daily.  6. Plavix 75 mg daily.  7. Toprol XL 25 mg daily.  8. He thinks he is taking chlorthalidone 25  mg daily, but he is not      sure.   FAMILY HISTORY:  Her mother is alive and well at age 74.  Father died at  age 40 with a history of CAD with his first MI occurring in his 91s.  He  has a sister who is a brittle diabetic.   SOCIAL HISTORY:  He lives in [place] by himself.  He works here at  [employer].  He denies any tobacco or drug use.  He  will have an occasional alcoholic beverage in a social setting but this  is less than once a week.  He previously was walking but has cut back on  this secondary to some pain in his foot.   REVIEW OF SYSTEMS:  Positive for chest pain, shortness breath, nausea,  and history of diabetes.  He had some foot pain, which has limited his  activities recently.  Otherwise, all systems were reviewed and negative.   PHYSICAL EXAMINATION:  VITAL SIGNS:  Temperature 98.2, heart rate 96,  respirations 18, blood pressure 154/94, and pulse ox 99% on room air.  GENERAL:  Pleasant white male in no acute distress.  Awake, alert and  oriented x3.  HEENT:  Normal.  NEUROLOGIC:  Grossly intact, nonfocal.  SKIN:  Warm and dry  without lesions or masses.  NECK:  No bruits or JVD.  LUNGS:  Respirations regular and labored.  Clear to auscultation.  CARDIAC:  Regular S1 and S2.  No S3 or S4 murmurs.  ABDOMEN:  Round, soft, nontender, nondistended.  Bowel sounds present  x4.  EXTREMITIES:  Warm, dry, pink.  No clubbing, cyanosis or edema.  Dorsalis pedis and posterior tibial pulse 2+ and equal bilaterally.   ACCESSORY CLINICAL FINDINGS:  Chest x-ray shows no active chest disease.  EKG shows sinus rhythm with a rate of 97 beats per minute with a left  axis deviation, no acute ST-T changes.   LABORATORY DATA:  Hemoglobin 15.5, hematocrit 44.4, WBC 5.7, and  platelets 341.  Sodium 139, potassium 4.1, chloride 102, CO2 29, BUN 15,  creatinine 0.83, glucose 130, total bilirubin 0.5, alkaline phosphatase  76, AST 26, ALT 30, CK-MB less than 1.0, troponin-I less than 0.5, and  calcium 9.1.   ASSESSMENT AND PLAN:  1. Unstable angina/coronary artery disease.  The patient presents with      a 1-day history of intermittent substernal chest discomfort with      radiation to the right similar to previous angina.  The story has      typical and atypical features.  At this point, cardiac markers are      negative.  We plan to admit and continue to cycle cardiac markers.      Given the similarity of his current symptoms to previous symptoms,      we will plan a cardiac catheterization this afternoon.  We will add      heparin, nitroglycerin, otherwise continue his aspirin, Plavix,      beta-blocker and statin therapy.  2. Hypertension.  Blood pressure is currently elevated here in the      emergency department.  We will continue beta-blocker and/we are      adding nitrate for now.  We need to see his home medication list.      He thinks he may be taking chlorthalidone but is not sure.  3. Hyperlipidemia.  Continue Lipitor 80 mg.  Check lipids and liver      function tests.  4. Diabetes mellitus.  Continue home Lantus dose as  well as sliding      scale insulin with NovoLog.  5. Human immunodeficiency virus.  Continue Atripla.  6. History of elevated liver function tests, currently normal.      Nicolasa Ducking, ANP      Jonelle Sidle, MD  Electronically Signed    CB/MEDQ  D:  11/08/2007  T:  11/09/2007  Job:  [id number]

## 2010-10-07 NOTE — Discharge Summary (Signed)
NAMECAIDAN, HUBBERT NO.:  [id number]   MEDICAL RECORD NO.:  [id number]          PATIENT TYPE:  INP   LOCATION:  6525                         FACILITY:  [facility]   PHYSICIAN:  Luis Abed, MD, FACCDATE OF BIRTH:  08/06/1965   DATE OF ADMISSION:  11/08/2007  DATE OF DISCHARGE:  11/09/2007                               DISCHARGE SUMMARY   PROCEDURES:  1. Cardiac catheterization.  2. Coronary arteriogram.  3. Left ventriculogram.   PRIMARY AND FINAL DISCHARGE DIAGNOSIS:  Chest pain, status post drug-  eluting stent to the circumflex for in-stent restenosis.   SECONDARY DIAGNOSES:  1. Family history of coronary artery disease in his father.  2. Status post percutaneous intervention to the circumflex in November      2007.  3. Hyperlipidemia.  4. Diabetes.  5. Hypertension.  6. Chronically elevated LFTs, all CMET values within normal limits at      this admission.  7. Human immunodeficiency virus.  8. Status post laparoscopic cholecystectomy and umbilical hernia      repair.   TIME AT DISCHARGE:  35 minutes.   HOSPITAL COURSE:  Mr. Fullen is a 45 year old male with known coronary  artery disease.  He developed chest pain on the day of admission and  came to the hospital where he was admitted for further evaluation.   His cardiac enzymes were negative for MI.  A cardiac catheterization was  performed which showed 90% in-stent restenosis in the circumflex which  was treated with a Promus DES reducing the stenosis to zero.  He had  nonobstructive coronary artery disease in the proximal circumflex and  LAD.  His EF was normal.   On November 09, 2007, Mr. Remmel was seen by Dr. Myrtis Ser.  A D-dimer was checked  and was negative.  Cardiac rehab saw Mr. Wigger and he was doing well.  Dr. Myrtis Ser considered him stable for discharge with outpatient followup  arranged.   DISCHARGE INSTRUCTIONS:  1. His activity level is to be increased gradually.  2. He is to call our  office for any problems with the cath site.  3. He is to stick to a diabetic diet.  4. He is to follow up with Dr. Myrtis Ser on July 2 at 10:15.  5. He is to follow up with Dr. Ouida Sills as needed.   DISCHARGE MEDICATIONS:  1. Aspirin 325 mg daily.  2. Plavix 75 mg daily.  3. Lisinopril 10 mg daily.  4. Metoprolol XL 25 mg daily.  5. Atripla 600/200/300 daily.  6. Lorazepam 1 mg daily.  7. Lipitor 80 mg a day.  8. NovoLog sliding scale.  9. Lantus 60 units at bedtime.  10.Nitroglycerin sublingual p.r.n.      Theodore Demark, PA-C      Luis Abed, MD, [facility]  Electronically Signed    RB/MEDQ  D:  11/09/2007  T:  11/10/2007  Job:  [id number]   cc:   Kingsley Callander. Ouida Sills, MD

## 2010-10-07 NOTE — Cardiovascular Report (Signed)
NAMESEMAJ, COBURN NO.:  [id number]   MEDICAL RECORD NO.:  [id number]          PATIENT TYPE:  INP   LOCATION:  6525                         FACILITY:  [facility]   PHYSICIAN:  Arturo Morton. Riley Kill, MD, FACCDATE OF BIRTH:  [birth year]   DATE OF PROCEDURE:  DATE OF DISCHARGE:                            CARDIAC CATHETERIZATION   INDICATIONS:  Lamar is a 45 year old well-known to [name].  He presents with  recurrent angina.  Approximately, 2 years ago he underwent stenting of  anomalous circumflex with drug-eluting stent.  He is insulin dependent  diabetes mellitus.  The current study was done because of recurrent  chest pain earlier today.   PROCEDURE:  1. Left heart catheterization.  2. Selective coronary arteriography.  3. Selective left ventriculography.  4. Percutaneous stenting of the in-stent restenosis using a 2.5 x 15      PROMUS drug-eluting stent, post dilated to 2.75.   DESCRIPTION OF PROCEDURE:  The patient was brought to the cath lab and  prepped and draped in usual fashion.  Through an anterior puncture, the  right femoral artery was easily entered and a 5-French sheath was  placed.  Following this, we used an L3-5 catheter to engage the left  coronary and a standard Judkins for the right.  We needed to use a  bypass catheter to engage the ostium of the anomalous circumflex.  Specifically, we had upgrade to a 6-French sheath, and we used a right  bypass catheter with side holes to engage.  It was difficult to engage  this, but we were subsequently successful.  Following this, central  aortic and left atrial pressures measured with pigtail and  ventriculography was performed in the RAO projection.  We then reviewed  the films, and reviewed the previous angiographic studies.  After  careful analysis, it was felt that restenting of the in-stent restenosis  which is fairly localized would be the most optimal therapy.  The  patient does have segmental plaque that  extends from the stented site  all the way back to the ostium, but this is not ideal given the  angulation, location in its proximity to the right ostium.  It is also  not critically narrow on the previous angiographic study.  Following  intracoronary nitroglycerin the whole area opened up.  We thus elected  to proceed with percutaneous intervention.  The patient received  bivalirudin according to protocol.  ACT was checked and found to be  appropriate for intervention. Based on this bright tip bypass catheter  with side holes, and were able to engage across with a Prowater wire.  Predilatation was done with 2.25 x 12 Maverick or apex balloon.  Following this, the whole area was stented using a 2.5 x 15 PROMUS drug-  eluting stent.  This was taken to 15 atmospheres.  Following this, post  dilatation was done with 2.75 Quantum Maverick up to 15 atmospheres.  There were no complications.  There is a good angiographic result.  All  catheters were subsequently removed and femoral sheath sewn into place.  He was taken to the holding  area in satisfactory clinical condition.   HEMODYNAMIC DATA:  1. Central aortic pressures was 103/73, mean was 88.  2. Left ventricular pressure was 102/80.  3. There is no gradient and pullback across aortic valve.   ANGIOGRAPHIC DATA:  1. Ventriculography was done in the RAO projection.  Overall systolic      function was preserved.  No segmental abnormalities or contractions      were identified.  2. The left coronary artery.  Specifically the LAD comes off      independently.  The LAD has a proximal diagonal branch, and there      is a tiny partial circ vessel that appears to come off of this, the      LAD courses them to the apex overlapping the takeoff of the      proximal diagonal is an area of approximately 20-30% luminal      reduction, but otherwise there is no evidence of high-grade      disease.  3. The right coronary artery is a large-caliber  vessel with posterior      descending and a bifurcating posterolateral branch.  No critical      stenoses are noted.  4. 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  At the ostium, there is      perhaps up to 50% narrowing, but after intracoronary nitroglycerin,      this clearly appears to be less impressive.  It almost always      occurs with a guide engaged.  There is some segmental plaque      leading into the previously stented area.  The stented area has a      focal in-stent restenosis of about 90%.  Following percutaneous      stenting, this is reduced to 0%.  Distal to this, the vessel opens      up and provides 4 large marginal branches.   CONCLUSION:  1. Well-preserved left ventricular function.  2. High-grade in-stent restenosis of previously placed stent in the      anomalous circumflex coronary artery with subsequent percutaneous      stenting using a PROMUS drug-eluting platform.  3. Insulin dependent diabetes mellitus.   DISPOSITION:  The patient be treated medically.  Continuation on aspirin  and Plavix will be warranted.  Optimization of diabetic status is  warranted also to try to prevent recurrent coronary events.      Arturo Morton. Riley Kill, MD, [facility]  Electronically Signed     TDS/MEDQ  D:  11/08/2007  T:  11/09/2007  Job:  [id number]   cc:   Luis Abed, MD, [facility]  Arturo Morton. Riley Kill, MD, [facility]

## 2010-10-10 NOTE — H&P (Signed)
NAMEANDER, WAMSER NO.:  [id number]   MEDICAL RECORD NO.:  [id number]          PATIENT TYPE:  INP   LOCATION:  6531                         FACILITY:  [facility]   PHYSICIAN:  Pricilla Riffle, MD, FACCDATE OF BIRTH:  [birth year]   DATE OF ADMISSION:  04/08/2006  DATE OF DISCHARGE:                              HISTORY & PHYSICAL   IDENTIFICATION:  [name] is a 45 year old who I was asked to see  regarding chest pain.   HISTORY OF PRESENT ILLNESS:  The patient had no know history of coronary  artery disease noted.  He was recently discharged from [facility] for  viral enteritis back in October.  He says since discharge he has been  somewhat weak and going to bed earlier.   He denies chest pain until yesterday.  He was mowing the lawn with his  push mower when he developed chest pressure that was severe.  This was  accompanied by diaphoresis, sweating, shortness of breath.  He went into  the house, drank a glass of water, went back outside, and the pain  started again.  He rates it an 8 out of 10 at its most intense.  It was  a pressure sensation with occasional left-sided sharp pain.   Last evening, he still had pressure intermittently, more on than off, in  his chest.  No alleviating factors, no exacerbating factors.  Again had  problems at night, was accompanied by diaphoresis, shortness of breath,  just could not get comfortable.   This morning he came to work, he ate a part of a bagel, still not  feeling well.  Someone at work asked what was wrong and he was sent to  the emergency room.  Here in the emergency room, he has gotten  nitroglycerin x1 and morphine x1 and the pain is now 1 out of 10.   He denies peptic ulcer disease, no GE reflux, and swallowing food okay.   ALLERGIES:  None.   MEDICATIONS:  1. Lantus insulin 40 units q.h.s.  2. NovoLog sliding-scale t.i.d.  3. Lisinopril 10 daily.  4. Aspirin 81 mg daily.   PAST MEDICAL HISTORY:  1.  Diabetes x4 to 5 years.  Last hemoglobin A1c 7.15.  2. History of viral enteritis requiring hospitalization with nausea      and vomiting.  3. Hypertension.   Of note, the patient had a stress test a couple of years ago at [place],  January 2006.  He presumes it was okay, but did not hear any official  results.   SOCIAL HISTORY:  The patient lives in [place].  He is single.  He  lives alone.  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  Does not  smoke.  Does not drink.  Does not use drugs.   FAMILY HISTORY:  Mother is healthy at age 70.  Father died at age 76,  had significant heart disease.  Sister with brittle diabetes.  Cousin  with heart disease.   REVIEW OF SYSTEMS:  Sweats yesterday.  Negative headaches.  GU:  History  of  kidney stones 10 years ago.  There is not history of infection.  Denies nausea, vomiting, no diarrhea, no blood per rectum.  Otherwise  all systems reviewed and negative to the above problem except as noted.   PHYSICAL EXAMINATION:  GENERAL:  On exam, the patient is in no acute  distress.  Still notes slight discomfort across the chest.  VITAL SIGNS:  Temperature 98, blood pressure 133 systolic, heart rate  105.  HEENT:  Normocephalic, atraumatic.  PERRLA.  EOMI.  Nose normal.  Throat  normal.  NECK:  JVP is normal.  No bruits.  No thyromegaly.  LUNGS:  Clear to auscultation.  CARDIAC:  Regular rate and rhythm.  S1, S2.  No S3, S4, or murmurs.  ABDOMEN:  Benign.  No hepatosplenomegaly.  Nontender.  EXTREMITIES:  Good distal pulses.  No lower extremity edema.  MUSCULOSKELETAL:  No joint deformities.  Normal range of motion.  NEUROLOGIC:  Alert and oriented x3.  Cranial nerves II-XII intact.  Strength 5/5 throughout.   Chest x-ray not done.  EKG sinus tachycardia 110 beats per minute, poor  R-wave progression.   LABORATORY DATA:  Hemoglobin 15.  BUN and creatinine of 12 and 0.6,  potassium of 4, and B-fraction 1.4.  Troponin less than 0.05.   Myoglobin  75.   IMPRESSION:  The patient is a 45 year old with a history of diabetes, a  strong family history of coronary artery disease with an episode  beginning yesterday of chest pressure, shortness of breath, diaphoresis,  began while he was mowing the lawn, on and off all night.  No history of  dysphagia or reflux.  Did have recent enteritis though, has been weak  since then.  Overall, worrisome for unstable angina.   RECOMMENDATIONS:  Admit, beta-blocker therapy will begin IV, heparin,  cath to define anatomy, continue insulin, check hemoglobin A1c for  diabetes, health care maintenance, check lipid panels in the morning.      Pricilla Riffle, MD, [facility]  Electronically Signed     PVR/MEDQ  D:  04/08/2006  T:  04/09/2006  Job:  [id number]   cc:   Kingsley Callander. Ouida Sills, MD

## 2010-10-10 NOTE — Assessment & Plan Note (Signed)
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  I had seen him in the distant past.  He has  had a Myoview scan that was done in January 2006, which showed no  significant abnormalities.   Recently he felt poorly and in fact felt a belching sensation while he  was mowing his yard.  He felt sweaty with this.  This occurred on three  occasions.  Ultimately he came to the hospital and was admitted and a  catheterization was done.  He had a slight troponin rise after his  intervention.  He received a Taxus stent to an anomalous circumflex.  He  stabilized and has gone home.  It was noted that he had elevated liver  function studies, of unknown etiology.  Because of this, he was not  started on a statin; however, niacin was started at 500 mg.  He has  diabetes mellitus and his hemoglobin A1c was 8.7.   Since being at home he has had exertional shortness of breath.  He has  had some vague chest discomfort.  He has not had the symptoms that  brought him to the hospital, which was a marked belching sensation and  pressure in his upper chest.   ALLERGIES:  No known drug allergies.   MEDICATIONS:  1. Niacin 500 mg.  2. Plavix 75 mg.  3. Lisinopril 10 mg.  4. Metoprolol 25 mg.  5. Lantus insulin.  6. NovoLog insulin.  7. Aspirin 325 mg.   PAST MEDICAL HISTORY:  See below.   REVIEW OF SYSTEMS:  As mentioned, he is having shortness of breath with  exertion.  The etiology is not yet clear to me.  Otherwise his review of  systems is negative.   PHYSICAL EXAMINATION:  VITAL SIGNS:  Today blood pressure 112/82,k pulse  94, weight 161 pounds.  GENERAL:  The patient is oriented to person, time and place.   Affect is  normal.  HEENT:  No xanthelasma.  He has normal extraocular motion.  NECK:  There are no carotid bruits.  There is no jugular venous  distention.  LUNGS:  Clear.  Respiratory effort is not labored.  HEART:  Reveals an S1 and S2.  No clicks or significant murmurs.  ABDOMEN:  Soft, no masses or bruits.  There are normal bowel sounds.  EXTREMITIES:  He has no peripheral edema.   An electrocardiogram today reveals no diagnostic abnormalities.   PROBLEM/PLAN:  1. Diabetes:  He needs much better control.  2. Elevated liver function studies:  They are to be repeated today.      Unless they have in fact improved, his niacin will also be stopped.  3. Coronary artery disease, status post  Taxus stenting to an      anomalous circumflex  artery.  4. Persistent shortness of breath with exertion.  The etiology is not      clear to me.  I am concerned that he might have ongoing ischemia,      although this is not his classic symptom.  The patient will have a      stress Myoview scan and will check his liver functions, and then I      will see him for followup.     Luis Abed, MD, [facility]  Electronically Signed    JDK/MedQ  DD: 04/27/2006  DT: 04/28/2006  Job #: [id number]   cc:   Kingsley Callander. Ouida Sills, MD

## 2010-10-10 NOTE — H&P (Signed)
[name], [name]                  ACCOUNT NO.:  [id number]   MEDICAL RECORD NO.:  [id number]            PATIENT TYPE:   LOCATION:                                 FACILITY:   PHYSICIAN:  Calvert Cantor, M.D.          DATE OF BIRTH:   DATE OF ADMISSION:  01/08/2005  DATE OF DISCHARGE:  08/18/2006LH                                HISTORY & PHYSICAL   PRIMARY CARE PHYSICIAN:  Stacie Glaze, M.D. in [place].   ADMISSION DIAGNOSIS:  Diabetic ketoacidosis.   CHIEF COMPLAINT:  Nausea and vomiting and abdominal pain.   HISTORY OF PRESENT ILLNESS:  This is a 45 year old white male with past  medical history of diabetes.  The patient has been insulin dependent for 2  years.  The patient states that a little over a week ago he managed to get a  splinter in his left foot.  The splinter did not come out and the area  became infected.  He went to see Dr. Lovell Sheehan on Monday where the splinter  was removed and he was started on antibiotics for 3 days.  He finished his 3-  day course yesterday, however, the patient states that he has been feeling  weak and nauseated.  He has been having vomiting which started today.  He  has not been able to eat.  He had been taking his medications properly for  the last couple of days.   REVIEW OF SYSTEMS:  The patient complains of abdominal pain. He does not  complain of any diarrhea.  There is no shortness of breath, no dizziness, no  chest pain, no dysuria.  No fever, chills, or headaches.   PAST MEDICAL HISTORY:  1.  Insulin-dependent diabetes mellitus.  2.  History of renal stones.   MEDICATIONS:  The patient takes Lantus 25 units q.h.s. and Novolin 70/30, 30  units q.12 h.   SOCIAL HISTORY:  The patient does not smoke or drink. He works at [employer].  He does not admit to any drug use.   ALLERGIES:  No known allergies.   SURGERY:  1.  Cholecystectomy.  2.  Umbilical hernia repair.   FAMILY HISTORY:  He has a history of coronary  artery disease in his family.  His father had his first heart attack when he was in his 30s.  He passed  away at the age of 45 of  a heart attack.  He also has a history of diabetes  in the family.  His sister has type 1 diabetes mellitus.  Mother is alive  and healthy.   PHYSICAL EXAMINATION:  VITAL SIGNS:  Blood pressure is 141/79, pulse 139,  respiratory rate 20, temperature 97.6 degrees.  Pulse oximetry is 96% on  room air.  HEENT:  Atraumatic normocephalic.  Pupils are equal and round.  Oral mucosa  appears dry.  NECK:  Supple.  HEART:  Regular rate and rhythm.  LUNGS:  Clear bilaterally.  ABDOMEN:  Soft.  It is tender in the upper part.  It is nondistended.  No  organomegaly.  Bowel sounds are positive.  EXTREMITIES:  Show no clubbing, cyanosis, or edema.  On his right foot there  is a small scar on the sole of his foot where the splinter was.  It does not  look infected.  There is no tenderness.  There is no discharge.   BLOOD WORK:  ABG on room air shows pH of 7.25, pCO2 of 21.8, pO2 110, bicarb  is 8.7.  O2 saturation is 97.2.  WBC 8.7, hemoglobin 16.9, hematocrit 49.9,  platelets 316,000.  Sodium 134, potassium 5.2, chloride 102, bicarb 12,  glucose 342, BUN 20, creatinine 1.5, alkaline phosphatase 74, total  bilirubin 0.8, AST 20, ALT 20, total protein 7.4, bilirubin 3.9, calcium  8.5, phosphorus 3.3, magnesium 1.8, blood acetones are large.  UA shows an  elevated specific gravity of more than 1.030, pH is 5.5.  It is positive for  glucose, bilirubin ketones, small amount of protein and 3-6 wbc's.   ASSESSMENT AND PLAN:  This is a 45 year old white male who recently had an  infected splinter and was placed on antibiotics.  It seems that the patient  has been in DKA for a few days now.  His blood sugar is only 300, however,  he is acidotic, has a large amount of acetones and is quite dehydrated.  The  patient will be receiving fluid boluses of normal saline.  He has been   started on an insulin drip which will be continued until he is no longer  acidotic.  Currently I will keep him on NPO.  He can have ice chips.  He  will be placed in the ICU where his blood sugars will be monitored q.1 h.  The patient does not appear to have any more infection and, therefore, does  not need antibiotics.  He will be resumed on his normal doses of insulin  once he is able to tolerate a diet and he is no longer acidotic.  He will be  discharged home on his usual doses of insulin.      Calvert Cantor, M.D.  Electronically Signed     SR/MEDQ  D:  01/10/2005  T:  01/10/2005  Job:  [id number]

## 2010-10-10 NOTE — Procedures (Signed)
[facility]  Patient:    [name], [name] Visit Number: [id number] MRN: [id number]          Service Type: OUT Location: RAD Attending Physician:  Talitha Givens Dictated by:   Delton See, P.A. Proc. Date: 08/09/01 Admit Date:  08/09/2001 Discharge Date: 08/09/2001   CC:         Stacie Glaze, M.D. [facility]   Stress Test  DATE OF BIRTH:  1966-04-28  BRIEF HISTORY:  Mr. Mcquerry is a pleasant 45 year old male with no known coronary artery disease, but significant risk factors including hypertension, diabetes, and positive family history of early coronary artery disease.  He was seen recently in the office and scheduled for an exercise Cardiolite to further evaluate for possible coronary disease.  Prior to the study he reported no recent chest pain or shortness of breath. His baseline EKG showed sinus tachycardia, rate 101.  There was somewhat poor R-wave progression.  Resting blood pressure 122/88, target heart rate 156.  The patient was able to exercise for a total of 8 minutes reaching a maximum heart rate of 180 beats per minute.  He was injected at 5 minutes and 31 seconds into the study at which time his heart rate was 155.  His blood pressure was 200/90.  In recovery his pressure returned to 140/72.  The patient had no chest pain during the study.  He did have some fatigue which resulted in the conclusion of the study.  He did not have any EKG changes. The images are pending at time of this dictation. Dictated by:   Delton See, P.A. Attending Physician:  Talitha Givens DD:  08/09/01 TD:  08/11/01 Job: [id number]

## 2010-10-10 NOTE — Discharge Summary (Signed)
[name], [name]                 ACCOUNT NO.:  [id number]   MEDICAL RECORD NO.:  [id number]          PATIENT TYPE:  INP   LOCATION:  A313                          FACILITY:  [facility]   PHYSICIAN:  [name], M.D.     DATE OF BIRTH:  05-10-1966   DATE OF ADMISSION:  04/08/2005  DATE OF DISCHARGE:  11/17/2006LH                                 DISCHARGE SUMMARY   PRIMARY CARE PHYSICIAN:  Dr. Darryll [name] in [place].   DISCHARGE DIAGNOSES:  1.  Diabetic ketoacidosis.  2.  Acute viral infection, possibly influenza.  3.  History of type 1 diabetes mellitus.   DISCHARGE MEDICATIONS:  He is to continue on his home medications which  include:  1.  Lantus 30 units q.h.s.  2.  Humalog 70/30, 30 units b.i.d.   HOSPITAL COURSE:  This is a 45 year old white male who developed fever,  chills, body aches, nausea and vomiting about 3-4 days before admission.  He  was seen by his doctor who did an influenza swab and told him that he was  positive for influenza A.  He was admitted and placed on an insulin drip, an  influenza swab was done for A and B and was negative at this time.  The  patient did well with IV insulin and IV fluids.  He received Reglan and  Zofran for nausea and vomiting.  He was titrated off the insulin drip the  following afternoon.  He has been maintained on Lantus and NovoLog sliding-  scale insulin.  His blood sugars have been within normal limits.   Discharge blood work is showing WBC 2.0 with low platelet count of 139, and  elevated monocytes at 12 which all point to a viral infection, however,  since he is doing much better today and able to tolerate a regular diet, he  is going to be discharged home on his regular home medications.  Further  blood work on discharge -- hemoglobin 13.1, hematocrit 36.8, sodium 139,  potassium 3.8, chloride 110, bicarb 23, glucose 125 this morning, BUN 11,  creatinine 0.7, calcium 7.2, influenza A and B antigens were negative.   Vitals on discharge, temperature is 99 degrees, pulse 98, respiratory rate  20, blood pressure 106/67, a CBG done this morning was 101.      [name], M.D.  Electronically Signed     SR/MEDQ  D:  04/10/2005  T:  04/10/2005  Job:  [id number]   cc:   [name], M.D. [facility]  [address] [place]  [place] [address]

## 2010-10-10 NOTE — H&P (Signed)
[name], MENEELY NO.:  [id number]   MEDICAL RECORD NO.:  [id number]          PATIENT TYPE:  INP   LOCATION:  A217                          FACILITY:  [facility]   PHYSICIAN:  Melvyn Novas, MDDATE OF BIRTH:  Oct 11, 1965   DATE OF ADMISSION:  03/21/2006  DATE OF DISCHARGE:  LH                                HISTORY & PHYSICAL   The patient is a 45 year old insulin dependent diabetic for a 6 or 7 year  patient of Dr. Ouida Sills.  The patient was in his usual state of health 36 hours  ago.  Began to feel URI symptoms, cough, nonproductive sputum.  He woke up  36 hours ago felt horrible and began vomiting and retching.  He had 15  episodes of vomiting and retching in the past 36 hours.  He presented to the  ER and he described some mild diffuse abdominal discomfort.  He also has  associated diarrhea but denies hematemesis followed by hematochezia.  The  patient was seen.  He had glucose of 173 with ketonuria and could not hold  down any food. He is admitted for gastroenteritis, possible gastroparesis  and intravascular volume depletion in the face of insulin dependent  diabetes. He is likewise noted to have a UTI.   PAST MEDICAL HISTORY:  Significant for insulin dependent diabetes for six  years, hypertension, question of gastroparesis.   PAST SURGICAL HISTORY:  Remarkable for lap cholecystectomy and umbilical  hernia repair.   SOCIAL HISTORY:  Lives alone, nonsmoker, nondrinker.  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   CURRENT MEDICATIONS:  1. Lisinopril 10 mg per day.  2. NovoLog sliding scale averaging 20 units a.c. t.i.d.  3. Lantus 40 units at night.   PHYSICAL EXAMINATION:  VITALS:  Blood pressure 104/64, he is afebrile.  Respiratory rate is 18.  His pulse is 92 and regular.  O2 sat is 97%.  HEENT:  Head:  Normocephalic, atraumatic. Eyes:  PERRLA,  EOM intact.  Sclerae clear.  Conjunctivae pink.  NECK:  No JVD, no carotid  bruits, no thyromegaly, no thyroid bruits.  LUNGS:  Clear to A and P.  No rales, wheezes or rhonchi.  HEART:  Regular rhythm, no murmurs, gallops, heaves, lifts, or rubs.  ABDOMEN:  Soft, bowel sounds normoactive.  No peristaltic rushes, no  borborygmi.  There is mild diffuse tenderness but no detectable  organomegaly.  EXTREMITIES:  With clubbing, cyanosis or edema.  NEUROLOGICAL:  Cranial nerves II-XII grossly intact.  Patient moves all four  extremities. Plantar's are downgoing.   IMPRESSION:  1. Gastroenteritis, possible gastroparesis.  2. Intravascular volume depletion.  3. Insulin dependent diabetes with ketonuria.  4. Hypertension.  5. Urinary tract infection.   The plan is to admit to S4, sliding scale insulin,  give 1/2 the dose of his  basal Lantus, clear liquid diet.  Zofran 4 mg IV q. 3 or 4 h. p.r.n. for  nausea.  Monitor electrolytes daily. Aggressive sliding scale and I will  make further recommendations as the database expands. Also added Levaquin  500 mg a day q. day intravenously for UTI.      Melvyn Novas, MD  Electronically Signed     RMD/MEDQ  D:  03/21/2006  T:  03/22/2006  Job:  [id number]

## 2010-10-10 NOTE — Discharge Summary (Signed)
[name], DEMEO NO.:  [id number]   MEDICAL RECORD NO.:  [id number]          PATIENT TYPE:  INP   LOCATION:  6531                         FACILITY:  [facility]   PHYSICIAN:  Omar Riffle, MD, FACCDATE OF BIRTH:  August 18, 1965   DATE OF ADMISSION:  04/08/2006  DATE OF DISCHARGE:  04/09/2006                         DISCHARGE SUMMARY - REFERRING   DISCHARGE DIAGNOSES:  1. ACS.  2. Coronary artery disease.  3. 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  4. Hyperlipidemia.  5. Hyperglycemia with a history of diabetes and a hemoglobin A1c of      8.7.  6. Significantly elevated liver function tests of uncertain etiology.   SUMMARY OF HISTORY:  [name] is a 45 year old white male who presented  to the emergency room complaining of chest discomfort.  The onset was  yesterday, which he noticed while mowing the grass.  This was  accompanied with shortness of breath and diaphoresis.  With rest, the  discomfort improved.  However, when he started exerting again, the  discomfort continued but resolved with rest.  In the evening, he  continued to have intermittent chest discomfort.  This morning when he  came to work at [employer], he still did not feel well.  He was  referred to the emergency room for further evaluation.   HISTORY:  1. Notable for insulin dependent diabetes.  2. Hypertension.  3. Lipid status unknown.   LABORATORY:  Chest x-ray on April 08, 2006 showed no active disease.  Admission H&H was 13.0 and 37.8, normal indices, platelets 348, WBCs  3.7, PTT 36, PT 13.9, sodium 140, potassium 3.6, BUN 10, creatinine 0.5,  glucose 57.  Alkaline phosphatase was 263.  SGOT 344, SGPT 376.  Hemoglobin A1c 8.7.  CK MBs and relative indexes were negative x3.  Troponin on one occasion was 0.12.  Total cholesterol was 146,  triglycerides 191, HDL 28, LDL 80.  TSH 1.414.   HOSPITAL COURSE:  [name] was admitted to [facility] for  further evaluation of his chest discomfort.  He was placed on 6500, and  given his multiple risk factors and presentation, cardiac  catheterization was felt to be in order.  This was performed on April 08, 2006 by Dr. Riley Kill.  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  He received Taxus stenting to the prior proximal circumflex,  reducing this to 0%.  LV function was within normal limits.  Dr. Riley Kill  felt that the patient should be maintained on aspirin and Plavix at  least for 1 year, but preferably indefinitely.  Post bed rest and  ambulation, the catheterization site was intact.  By April 09, 2006,  it was felt that he could be discharged home with further outpatient  management.   PROCEDURES PERFORMED:  1. Cardiac catheterization on April 08, 2006.  2. 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 Riley Kill.   DISPOSITION:  [name] is discharged to home.  He is asked to maintain a  low salt, fat and cholesterol ADA diet.  Activity and wound care per  supplemental discharge sheet.  He was asked to bring all medications to  all follow-up appointments.  New medications include Plavix 75 mg daily,  nitroglycerin 0.4 as needed, Lopressor 25 mg b.i.d. and niacin 500 mg  q.h.s.  He was asked to increase his aspirin to 325 mg daily and take 30  minutes prior to his niacin.  Continue lisinopril 10 mg daily, Lantus 40  units q.h.s. and NovoLog sliding scale t.i.d.  It is noted a statin will  not be started at this time, given his increased LFTs and recent viral  illness.  He will need blood work in approximately 6-8 weeks in regards  to ________, given the initiation of his niacin.  However, we will ask  Dr. Myrtis Ser to please check some LFTs within a couple weeks.  If these have  diminished, then a statin will be started with follow-up accordingly.  Prior to his discharge, we are drawling additional  blood for hepatitis  panels, as well as GGT and ferritin per Dr. Tenny Craw.  The patient will not  remain in the hospital to wait for these results.  We will have Dr. Myrtis Ser  follow up on these results in the office.  Dr. Myrtis Ser will see him in  [place] on April 27, 2006 at 3:00 p.m.  [name] was asked to  continue to follow with Dr. Ouida Sills for better sugar control.   DISCHARGE TIME:  Greater than 30 minutes.      Joellyn Rued, PA-C      Omar Riffle, MD, [facility]  Electronically Signed    EW/MEDQ  D:  04/09/2006  T:  04/09/2006  Job:  [id number]   cc:   Kingsley Callander. Ouida Sills, MD  Luis Abed, MD, [facility]

## 2010-10-10 NOTE — Discharge Summary (Signed)
[name], [name]                          ACCOUNT NO.:  [id number]   MEDICAL RECORD NO.:  [id number]                   PATIENT TYPE:  INP   LOCATION:  5741                                 FACILITY:  [facility]   PHYSICIAN:  [name], M.D. [facility]           DATE OF BIRTH:  [birth year]   DATE OF ADMISSION:  12/15/2002  DATE OF DISCHARGE:  12/17/2002                                 DISCHARGE SUMMARY   DISCHARGE DIAGNOSES:  1. Gastroenteritis, unknown cause.  2. Uncontrolled diabetes.   DISCHARGE MEDICATIONS:  1. Cipro 500 mg p.o. b.i.d. for six days.  2. Flagyl 500 mg p.o. t.i.d. for six days.  3. Phenergan 25 mg p.o. q.8h. p.r.n. (#18 given).  4. Novolog insulin sliding scale, 60-100 give 0 units, 100-150 give 2 units,     151-200 give 3 units, 201-250 give 6 units, 251-300 give 9 units, 301-350     give 12 units, and above 350 give 15 units (this is to be given before     meals).  Before bedtime, the sliding scale of insulin starts at 201-250     to give himself 2 units of insulin, 251-300 give 3 units, 301-350 give 4     units, and above 350 give 5 units.   HOSPITAL PROCEDURES:  CT of the abdomen and pelvis performed demonstrated a  surgically absent gallbladder.  The final impression of the CT of the  abdomen was unremarkable.  The final impression of the CT of the pelvis was  negative.   HOSPITAL LABORATORIES:  Stool cultures are incubated for better growth.  The  BMET on December 16, 2002, was normal, except for a glucose of 195 and a calcium  of 8.2.  Urine culture with no growth.  Blood cultures with no growth to  date.  Lipid profile with a total cholesterol of 169, LDL 107, and HDL 35.  Hemoglobin A1C 12.7.  On admission, the CMET was normal, except for a  glucose of 274.   FOLLOWUP PLANS:  Dr. Lovell [name] in one week.   CONDITION ON DISCHARGE:  Improved.   HOSPITAL COURSE:  The patient was admitted to the hospital on December 15, 2002.  See Dr. Frederik [name] note for admission  details.  The patient was admitted with  abdominal discomfort, nausea, and diarrhea.  Also found to be hyperglycemic.  The patient was treated with IV fluids and antiemetics.  On December 16, 2002,  CT of the abdomen and pelvis was ordered with results as above.  Empiric  antibiotics were started at that time.  The patient did improve.  He still  had some vague abdominal discomfort on examination.  Bowel sounds are  active.  The abdomen is not distended.  No masses.  No rebound or guarding  tenderness present.  The patient is ambulating without difficulty.  He feels  that he is well enough to go home.  I think  he ought to have close followup  to make sure this resolves.  He understands to see Dr. Lovell [name] this weeks.   Diabetes:  A patient with known diabetes.  He is not compliant with a diet  or exercise regimen.  He is not taking any medications at this time.  I  think is safest for him to be discharged on the insulin regimen that he was  on in the hospital and that can be readdressed as an outpatient.  To will be  instructed on self-injection of insulin prior to discharge.                                                Bruce Rexene Edison [name], M.D. [facility]    BHS/MEDQ  D:  12/17/2002  T:  12/17/2002  Job:  [id number]

## 2010-10-10 NOTE — Assessment & Plan Note (Signed)
[facility]                            CARDIOLOGY OFFICE NOTE   DAMONIE, ELLENWOOD                        MRN:          [id number]  DATE:08/10/2006                            DOB:          December 30, 1965    Mr. Tupy is seen for Cardiology followup.  He underwent coronary  intervention in November of 2007.  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  It was known at that time that he had diabetes.  Patient then was noted to have marked liver function abnormalities, and  this was evaluated carefully.  Ultimately, the patient was found to be  HIV positive, and he has been working with Dr. Melven Sartorius at [facility].  We are hoping that his liver will improve completely as he stabilizes  with his HIV meds.  He has not had any recurring chest pain.  He is not  having any marked shortness of breath.   PAST MEDICAL HISTORY:   ALLERGIES:  NO KNOWN DRUG ALLERGIES.   MEDICATIONS:  1. Plavix 75.  2. Lisinopril 10.  3. Metoprolol 25 b.i.d.  4. Lantus.  5. NovoLog.  6. Aspirin.  7. Atripla (HIV med).   OTHER MEDICAL PROBLEMS:  See the list below.   REVIEW OF SYSTEMS:  He feels much better at this time overall, and his  review of systems is negative.   PHYSICAL EXAMINATION:  Blood pressure is 132/89 with a pulse of 90.  The patient is oriented to person, time, and place, and his affect is  normal.  LUNGS:  Clear.  Respiratory effort is not labored.  HEENT:  Reveals no xanthelasma.  He has normal extraocular motion.  There are no carotid bruits.  CARDIAC EXAM:  Reveals an S1 with an S2.  There are no clicks or  significant murmurs.  There is no peripheral edema.   No labs are done today.   PROBLEM:  1. Coronary disease status post intervention, stable.  2. Elevated liver functions.  3. Diabetes.  4. Human immunodeficiency virus positive.   Patient is improving in all regards, and I will see him back in 6  months.     Luis Abed,  MD, [facility]  Electronically Signed    JDK/MedQ  DD: 08/10/2006  DT: 08/11/2006  Job #: [id number]   cc:   Kingsley Callander. Ouida Sills, MD  Veverly Fells. [place], M.D.  Melven Sartorius, MD

## 2010-10-10 NOTE — Op Note (Signed)
[facility]  Patient:    [name], [name] Visit Number: [id number] MRN: [id number]          Service Type: DSU Location: 5700 [id number] Attending Physician:  [name] Dictated by:   [name], M.D. Proc. Date: 08/11/01 Admit Date:  08/19/2001 Discharge Date: 08/20/2001   CC:         [name], M.D.  [name], M.D. [facility]   Operative Report  PREOPERATIVE DIAGNOSES: 1. Biliary dyskinesia. 2. Umbilical hernia.  POSTOPERATIVE DIAGNOSES: 1. Biliary dyskinesia. 2. Umbilical hernia.  PROCEDURE: 1. Laparoscopic cholecystectomy with intraoperative cholangiogram. 2. Umbilical hernia repair.  SURGEON:  [name], M.D.  ASSISTANT:  [name]. [name], M.D.  ANESTHESIA:  General.  INDICATIONS:  [name] is a 45 year old male who has had fairly classic biliary colic-type pain.  Upper endoscopy was unremarkable.  Gallbladder ultrasound is negative.  Nuclear medicine hepatobiliary scan demonstrated findings consistent with biliary dyskinesia.  He also had a small umbilical hernia on exam.  We discussed repairing this.  He now presents for the above procedures, admitted electively.  DESCRIPTION OF PROCEDURE:  He was placed supine on the operating table. General endotracheal anesthesia was administered.  The periumbilical area and abdomen were shaved and sterilely prepped and draped.  Local anesthetic consisting of 0.5% Marcaine was infiltrated in the periumbilical region and a transverse subumbilical incision made and carried down to the fascia.  The hernia was identified and the umbilicus amputated off it.  The peritoneal cavity was entered through the defect.  A Hasson trocar was introduced into the peritoneal cavity through the defect and a pneumoperitoneum created by insufflation of CO2 gas.  Next, the laparoscope was introduced.  Examination of the liver showed it had changes consistent with fatty liver  changes. Subsequently he was placed in the appropriate position and an 11 mm trocar was placed through a similar size epigastric incision.  Two 5 mm trocars were placed in the right abdomen through similar size incisions.  The fundus of the gallbladder was grasped.  Fatty tissue adherent to the gallbladder was dissected bluntly.  The infundibulum was completely mobilized, identified a cystic duct and isolated it.  _____  with the gallbladder and proximal to this.  I then isolated an anterior branch of the cystic artery, clipped it and divided it.  Posterior branch of the cystic artery was clipped and divided. This allowed for a complete window around the cystic duct.  I placed a clip at the cystic duct/gallbladder junction and made an incision in the cystic duct. I then placed a cholangiocather into the anterior abdominal wall and placed it to the cystic duct and cholangiogram was performed.  Under real time fluoroscopy, dilute contrast material was injected into the cystic duct.  It promptly emptied into the common bile duct and promptly drained into the duodenum without obvious evidence of obstructive phenomenon. The common hepatic _____ and hepatic ducts were also visualized.  Final reports pending the radiologist interpretation.  I removed the cholangiocatheter, cystic duct was clipped two times proximally and divided.  The gallbladder was dissected free from the liver bed.  There were some small perforations made in the gallbladder with some leakage of bile.  Once the gallbladder was free from the liver bed, it was placed in an endopouch bag.  I then copiously irrigated the gallbladder fossa and controlled bleeding points with cautery.  I used 1.5-2 liters of irrigation for this.  I evacuated  as much of the fluid as possible.  Next, I removed the gallbladder through the subumbilical port.  I did repair the umbilical hernia with interrupted 0 Novofil sutures and checked the  repair under laparoscopic vision.  No evidence of omental or bowel adherence to the repair was noted.  The peritoneum was released and the remaining trocars were removed.  Umbilicus was reimplanted to the fascia with interrupted 4-0 Monocryl sutures.  Skin incisions were then closed with 4-0 Monocryl subcuticular stitches followed by Steri-Strips and sterile dressings.  He tolerated the procedure well without any apparent complications and was taken to the recovery room in satisfactory condition. Dictated by:   [name], M.D. Attending Physician:  [name] DD:  08/19/01 TD:  08/20/01 Job: [id number]

## 2010-10-10 NOTE — Discharge Summary (Signed)
[name], [name]                 ACCOUNT NO.:  [id number]   MEDICAL RECORD NO.:  [id number]          PATIENT TYPE:  INP   LOCATION:  A217                          FACILITY:  [facility]   PHYSICIAN:  Kingsley Callander. Ouida Sills, MD       DATE OF BIRTH:  1965/07/28   DATE OF ADMISSION:  03/21/2006  DATE OF DISCHARGE:  10/30/2007LH                                 DISCHARGE SUMMARY   DISCHARGE DIAGNOSES:  1. Viral gastroenteritis.  2. Possible urinary tract infection.  3. Diabetes.  4. Dehydration.   HOSPITAL COURSE:  This patient is a 44 year old male who presented with a 2-  day history of nausea, vomiting and diarrhea.  He has underlying diabetes  and has a history of diabetic ketoacidosis.  On presentation, he was  afebrile with a white count of 7.1.  He had diffuse abdominal discomfort.  His glucose was 173.  His BUN and creatinine were up to 38 and 2.1.  His  urinalysis revealed ketones, protein and 11-20 white cells with few  bacteria.   He was treated with Zofran and IV fluids.  A urine culture was obtained and  he was treated with IV Levaquin.  His Lantus dose was reduced and he was  treated with NovoLog.   His nausea and vomiting resolved.  He felt much better and his diet was  advanced.  His BUN creatinine improved to 14 and 0.6.  His hemoglobin A1c  was 8.9.   He was improved and stable for discharge on the morning of October 30.  His  urine culture remains pending at this point.   DISCHARGE MEDICATIONS:  1. Lantus 40 units daily.  2. NovoLog 20 units three times a day.  3. Lisinopril 10 mg daily.  4. Aspirin 81 mg daily.   FOLLOW UP:  The patient will follow up within the next 1-2 weeks if  necessary.  He will otherwise have a followup in 12 weeks with a hemoglobin  A1c measurement again at that time.      Kingsley Callander. Ouida Sills, MD  Electronically Signed    ROF/MEDQ  D:  03/23/2006  T:  03/23/2006  Job:  [id number]

## 2010-10-10 NOTE — Discharge Summary (Signed)
NAMEHOYLE, [name]                ACCOUNT NO.:  [id number]   MEDICAL RECORD NO.:  [id number]          PATIENT TYPE:  INP   LOCATION:  5739                         FACILITY:  [facility]   PHYSICIAN:  Rene Paci, M.D. LHCDATE OF BIRTH:  June 14, 1965   DATE OF ADMISSION:  06/30/2004  DATE OF DISCHARGE:  07/01/2004                                 DISCHARGE SUMMARY   DISCHARGE DIAGNOSES:  1.  Uncontrolled diabetes.  2. Early DKA.  3. Hypokalemia.  4. Viral      gastroenteritis.   BRIEF ADMISSION HISTORY:  [name] is a 45 year old white male with  uncontrolled diabetes who presented to the [facility] Emergency Department  with complaints of nausea and vomiting this morning.  He was found to be in  early DKA.  He was treated with IV fluids and an insulin drip.  The patient  arranged to transfer to [facility] for closer observation and care  by his primary care physician.  The insulin drip was discontinued and he was  given 30 units of Lantus at [facility].  The patient was  transferred to [facility].   PAST MEDICAL HISTORY:  1.  Adult onset diabetes mellitus, insulin requiring.  2. Status post      cholecystectomy in 3/03.   HOSPITAL COURSE:  Problem 1. Adult onset diabetes mellitus.  The patient  presented with early DKA with bicarb of 15.  This was likely precipitated by  a viral gastroenteritis.  The patient was given Lantus on transfer to [facility].  His blood sugars were less than 200 on arrival.  The  patient's home regimen was resumed.  The patient was treated with IV fluids.  The patient's hemoglobin A1C was obtained and was elevated at 12.9%   Problem 2. Viral gastroenteritis.  This is improving.   Problem 3. Hypokalemia.  Likely secondary to hyperosmolar state as well as  underlying GI loss.   DISCHARGE LABORATORIES:  Potassium 3.2, BUN 9, creatinine 0.5, hemoglobin  A1C 12.9%, FLT pending.   DISCHARGE MEDICATIONS:  He is to resume  his Lantus and 70/30 he has at home.  He is to check his blood sugars q.a.c. and q.h.s. and follow up with Dr.  Lovell Sheehan next week.      LC/MEDQ  D:  07/01/2004  T:  07/01/2004  Job:  [id number]   cc:   Stacie Glaze, M.D. [facility]

## 2010-10-10 NOTE — Assessment & Plan Note (Signed)
[facility]                            CARDIOLOGY OFFICE NOTE   [name], [name]                        MRN:          [id number]  DATE:04/29/2006                            DOB:          February 23, 1966    Mr. [name] was in the office recently.  See my dictated note from  April 27, 2006.  We drew some labs at that time.  His SGOT and PT are  improving, but his alkaline phosphatase is worse, up to 1000, and his  bilirubin has gone from 0.5 up to 1.2.  Also, his hemoglobin A1c in the  hospital was quite high.  I spoke with Dr. Carylon Perches this morning, his  primary doctor in [place].  I am faxing the hospital labs, the  discharge summary and my office note up to Dr. Ouida Sills today, and Dr.  Ouida Sills will be following up on it.     Luis Abed, MD, [facility]  Electronically Signed    JDK/MedQ  DD: 04/29/2006  DT: 04/29/2006  Job #: [id number]

## 2010-10-10 NOTE — Cardiovascular Report (Signed)
[name], [name]                 ACCOUNT NO.:  [id number]   MEDICAL RECORD NO.:  [id number]          PATIENT TYPE:  INP   LOCATION:  6531                         FACILITY:  [facility]   PHYSICIAN:  Arturo Morton. Riley Kill, MD, FACCDATE OF BIRTH:  11-08-1965   DATE OF PROCEDURE:  DATE OF DISCHARGE:                            CARDIAC CATHETERIZATION   INDICATIONS:  [name] is a very pleasant 45 year old gentleman who  presents with fatigue and chest pain.  He was seen by Dr. Tenny Craw and she  felt that cardiac catheterization was indicated.  He was brought to the  catheterization laboratory for further evaluation.  Importantly, he has  strong family history, and insulin dependent diabetes.   PROCEDURE:  1. Left heart catheterization.  2. Selective coronary arteriography.  3. Selective left ventriculography.  4. 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   DESCRIPTION OF PROCEDURE:  The patient was brought to the  catheterization laboratory, prepped and draped the usual fashion through  an anterior puncture the right femoral artery was easily entered.  Views  of the left coronary artery were then obtained.  We noted that at that  point there was not an AV circumflex, and so we used a guiding catheter  to engage the right so we could better visualize potential and anomalous  circumflex.  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  We then  used a right bypass catheter to engage the anomalous circumflex.  Central aortic and left ventricular pressures were measured with  pigtail.  Ventriculography was performed in the RAO projection.  Dr.  Tenny Craw then came to the laboratory, we were agreement that percutaneous  intervention was warranted.  The lesion involved the proximal portion of  the anomalous circumflex.  We used a right bypass catheter with side  holes to engage the vessel.  We did give bivalirudin  according to  protocol.  The patient had been previously treated  with aspirin, and he  was given 600 mg of clopidogrel in the laboratory.  A Prowater wire was  then placed down across the lesion and the lesion dilated with a 2.25 mm  balloon.  Following this, we discussed possible stent options.  The  distal portion of the vessel was clearly a 2.5 mm artery, more  proximally it was about a 2.25 vessel.  Because of the patient's insulin  dependent diabetes, we elected to deploy a Taxus drug-eluting platform  as a 2.5 x 20 Taxus drug-eluting stent.  Because of the vessel size, we  elected to dilate this to 9 atmospheres.  We then went in with a 2.25-mm  balloon, and did multiple dilatations.  A 2.25 mm Quantum Maverick was  used to dilate throughout the stent to 14 atmospheres.  We then took a  2.5 mm Quantum Maverick into the distal portion of the stent, and this  was also taken to about 12-13 atmospheres at the distal portion of the  stent telescoping into the area that was slightly larger.  There was an  excellent angiographic appearance.  All catheters were subsequently  removed and  we elected to sew in the femoral sheath.  Bivalirudin was  discontinued.  He was taken to 6500 in satisfactory clinical condition.   HEMODYNAMIC DATA:  1. Central aortic pressure 111/67, mean 89.  2. Left ventricular pressure 115/70.  3. No gradient pullback across aortic valve.   ANGIOGRAPHIC DATA.:  1. Ventriculography in the RAO projection reveals what appears to be      preserved global systolic function without significant valvular      regurgitation.  2. The left main is long.  Importantly, there appear to be two      intermediate vessel that come off with some mild proximal      irregularities and some mild tapered narrowing.  The LAD provides a      septal perforator that bifurcates distally.  There is some mild      luminal irregularity, but no critical stenoses an AV circumflex is      not noted.  3. The right coronary is a very large-caliber vessel  providing      posterior descending and posterolateral system.  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  This supplies four marginal branches of moderate size.      There is a focal stenosis of about 90%.  The area is slightly      segmentally diseased.  Following deployment of a 2.5 x 20 Taxus      drug-eluting stent.  This is reduced to 0% with an excellent      angiographic appearance.   CONCLUSIONS:  1. 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  2. Insulin dependent diabetes mellitus.   DISPOSITION:  The patient is at high risk for recurrent coronary events.  Aggressive risk factor reduction is recommended.  Aspirin and Plavix for  minimum of one year and possibly longer will be recommended with a  duration to be determined by the patient's cardiologist.      Arturo Morton. Riley Kill, MD, [facility]  Electronically Signed     TDS/MEDQ  D:  04/08/2006  T:  04/09/2006  Job:  [id number]   cc:   Stacie Glaze, MD  Kingsley Callander. Ouida Sills, MD  Omar Riffle, MD, [facility]

## 2010-10-10 NOTE — H&P (Signed)
NAMEDACOTAH, CABELLO                ACCOUNT NO.:  [id number]   MEDICAL RECORD NO.:  [id number]          PATIENT TYPE:  INP   LOCATION:  A226                          FACILITY:  [facility]   PHYSICIAN:  Osvaldo Shipper, MD     DATE OF BIRTH:  [birth year]   DATE OF ADMISSION:  09/01/2004  DATE OF DISCHARGE:  LH                                HISTORY & PHYSICAL   PRIMARY CARE PHYSICIAN:  Dr. Darryll Capers from [place].   ADMISSION DIAGNOSES:  1.  Diabetic ketoacidosis.  2.  Chest pain.  Rule out ACS.   CHIEF COMPLAINT:  Vomiting and chest pain since yesterday.   HISTORY OF PRESENT ILLNESS:  The patient is a 45 year old Caucasian male  with a past medical history of diabetes for the past four years and insulin-  dependent since the past two years, who was recently discharged about two  months ago from [facility] for DKA and gastroenteritis.  The  patient mentions that he felt slightly nauseous on Saturday but did not have  any vomiting.  He was able to eat and took his medications.  Yesterday he  felt very unwell and very weak, which was generalized.  He felt a little bit  dizzy.  Yesterday he did not take his insulin shots.  Late in the night and  early in the morning the patient started having nausea and started actively  vomiting.  The vomitus initially was clear followed by bilious material.  At  no point did he have any blood, according to the patient.  The patient  mentioned that about 6:30 this morning he also started experiencing  retrosternal to left-sided chest pressure.  He said the pain did not radiate  anywhere.  He did experience some shortness of breath and dizziness.  He did  not take anything for these symptoms.  By the time he came to the emergency  room, it was about 4/10 and currently it is still about 4/10.  There were no  palpitations associated with this chest pain and no diaphoresis.  The chest  pain is reproducible with palpation.   The patient, as  mentioned above, starting vomiting late last night.  He  vomited every 15 minutes or so and his last emesis was about an hour and a  half ago.  He mentioned some upper abdominal discomfort with the emesis.  He  had not had any diarrhea.  No report of blood in the stool.  He does not  give any history of any cough or fevers.  No joint pains.  No dysuria or  hematuria.   MEDICATIONS:  1.  Lantus insulin 20 units at bedtime.  2.  Novolin 70/30, 20 units b.i.d.  3.  He was recently started on a fast acting subcutaneous form of insulin,      which he does not know the name of.  4.  He takes aspirin on a p.r.n. basis.   ALLERGIES:  He is not allergic to any medication.   PAST MEDICAL HISTORY:  1.  Diabetes for the past four years and requiring  insulin for the past two      years.  2.  No other medical history.  3.  He does have a history of kidney stones in the past.  4.  He had a stress test done about a couple of months ago, which was      normal.  This was done at [facility].   SURGICAL HISTORY:  Includes a cholecystectomy and umbilical hernia repair  about two years ago.   SOCIAL HISTORY:  The patient lives alone.  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  He is a  nonsmoker.  No alcohol consumption.  No illicit drug use.   FAMILY HISTORY:  Father had coronary artery disease prematurely.  His first  attack was in his 72s and he died at the age of 83 of a heart attack.  His  mother is healthy.  There is a history of diabetes in a sister, which is  juvenile onset.  Otherwise the family history was unremarkable.   REVIEW OF SYSTEMS:  A 10 point review of systems was done, which was  negative except for the things mentioned under the HPI.   PHYSICAL EXAMINATION:  VITAL SIGNS:  Temperature 97, blood pressure 132/82,  heart rate about 120 beats/minute.  Pulse oximetry 96% on 2 L.  Respiratory  rate about  16.  GENERAL:  This is a thin young male in slight discomfort.  HEENT:  No pallor, no icterus.  The mucous membranes are very dry.  No oral  lesions were seen.  NECK:  Soft and supple.  LUNGS:  Clear to auscultation bilaterally.  CHEST:  There was tenderness to palpation and his chest pain was  reproducible on palpation anteriorly in the retrosternal area.  CARDIOVASCULAR:  S1 S2 was very tachycardiac but regular.  No murmurs were  appreciated.  No bruits were heard.  No JVD was seen.  ABDOMEN:  Soft.  There was tenderness diffusely but more so in the upper  abdomen.  No rebound or rigidity was appreciated.  Bowel sounds were all in  all quadrants but were normal sounding.  There was some CVA tenderness  present bilaterally, more so on the left.  EXTREMITIES:  Were without any edema.  Peripheral pulses were palpable.   LABORATORY DATA:  The white count is 13.7 with 90% neutrophils, hemoglobin  17.2, hematocrit 50.1, MCV is 85, platelet count 372.  Sodium 136, potassium  4.6, chloride 103, bicarbonate low at 11, glucose 381, BUN 18, creatinine  1.1.  Anion gap was calculated to be 22.  His liver function parameters were  within normal limits.  His calcium was 9.0.  UA was done, which showed a pH  of 5, specific gravity 1.025.  Urine glucose was present.  More than 80  ketones were seen.  No blood.  Leukocytes and nitrites were negative.   IMPRESSION:  This is a 45 year old Caucasian male with past medical history  of diabetes requiring insulin for the past two years, who presents with  vomiting since last night, along with some retrosternal chest pressure.  The  patient is found to be in diabetic ketoacidosis.  No clear precipitating  factor is identified. He is very tachycardiac and very dehydrated.  The  patient has a significant family history of premature coronary artery  disease.  He mentioned some chest pressure symptoms starting last night. He also has leukocytosis.   PLAN:   1.  Diabetic ketoacidosis.  We will admit the patient to the ICU and start      him on an insulin infusion.  We will monitor his BMPs initially every      four hours and monitor his CBGs every hour.  An adjustment will be made      to his insulin drip.  Once his anion gap is corrected, the patient will      be switched over to subcutaneous insulin.  We will also start the      patient on IV fluids initially with normal saline and later on with D-5      half normal saline.  We will keep the patient NPO for now, although he      may have some ice chips as tolerated.  We will also cover the patient      with Reglan for his nausea.  We will check a HbA1c level for this      patient.  2.  Cardiac chest pain.  It sounds atypical but this patient has a      significant family history of coronary artery disease, as well as he is      a diabetic.  We will rule the patient out for acute coronary syndrome,      obtain an EKG, and a chest x-ray.  He did have a stress test recently.  3.  Leukocytosis: Patient is afebrile and has no foci of infection. We will      hold off on starting antibiotics.   Further management decisions will be based on the patient's response to  initial treatment and the results of the initial testing.      GK/MEDQ  D:  09/01/2004  T:  09/01/2004  Job:  [id number]   cc:   Stacie Glaze, M.D. [facility]

## 2010-10-10 NOTE — Discharge Summary (Signed)
[name], CULLEY NO.:  [id number]   MEDICAL RECORD NO.:  [id number]          PATIENT TYPE:  INP   LOCATION:  A208                          FACILITY:  [facility]   PHYSICIAN:  Osvaldo Shipper, MD     DATE OF BIRTH:  June 28, 1965   DATE OF ADMISSION:  06/14/2005  DATE OF DISCHARGE:  01/23/2007LH                                 DISCHARGE SUMMARY   PRIMARY CARE PHYSICIAN:  Dr. Darryll Capers in [place].  The patient is  planning to change his PMD to Dr. Ouida Sills.   DISCHARGE DIAGNOSES:  1.  Diabetic ketoacidosis resolved.  2.  Type-1 diabetes.  3.  Viral gastroenteritis resolved.   HISTORY OF PRESENT ILLNESS:  Please see H&P dictated at the time of  admission.   BRIEF HOSPITAL COURSE:  Briefly, this is a 45 year old Caucasian male with a  history of insulin-dependent diabetes, who has been admitted multiple times  to our service for DKA.  The patient presented on Monday with nausea and  vomiting, symptoms of gastroenteritis.  He was found to have significant  acidosis and was admitted for further management.  The patient did not  require an insulin drip however.  He was straight away put on Lantus.  He  has done well.  He is able to tolerate p.o. intake and has not had any  nausea and vomiting in the hospital.  The patient did have hypokalemia,  which was managed with potassium supplementation.  He was running a low  grade temperature, which was likely from gastroenteritis, which could be  viral.  UA did not suggest UTI. Overall he was quite stable at this time of  discharge and wanted to go home.   The need for being on an ACE inhibitor was discussed with the patient at the  time of admission.  It needs to be further discussed with his PMD.   DISCHARGE MEDICATIONS:  1.  We are not changing his regimen at all.  See H&P.  2.  I am giving him a three day course of Levaquin 500 mg orally.   FOLLOW UP:  With PMD in one week.  A referral is being made for an  endocrinologist at [facility], Dr. Ardis Rowan, [phone].  We will try to  make an appointment for this patient.   DIET:  The patient will continue to have an 1800 calorie ADA diet.   No imaging studies were done.      Osvaldo Shipper, MD  Electronically Signed     GK/MEDQ  D:  06/16/2005  T:  06/16/2005  Job:  [id number]   cc:   Stacie Glaze, M.D. [facility]  [address] [place]  [place] [address]   David Stall, M.D.  Fax: [phone]

## 2010-10-10 NOTE — Assessment & Plan Note (Signed)
[facility]                            CARDIOLOGY OFFICE NOTE   TAINO, MAERTENS                        MRN:          [id number]  DATE:05/11/2006                            DOB:          05/11/2006    Mr. Yearby is seen back today for a careful followup.  See my complete  note of April 27, 2006.  The patient was feeling poorly at that time.  Labs were obtained.  We decided to proceed with a Myoview scan to be  sure that he did not have any major ongoing problem.  This study was  done and he has no abnormalities.  Labs were drawn and the patient had  abnormal liver function studies.  We called him immediately and stopped  his Niacin.  I also called Dr. Carylon Perches.  Since that time,  Dr. Ouida Sills  has seen the patient and he has seen Dr. Karilyn Cota in [place].  The  patient is feeling better.  I do not know the results of all of his  studies.  Today, we reviewed his test results and I am pleased that he  has no ischemia.   PAST MEDICAL HISTORY:   ALLERGIES:  No known drug allergies.   MEDICATIONS:  1. Plavix 75.  2. Lisinopril.  3. Metoprolol.  4. Lantus.  5. NovoLog.  6. Aspirin.   OTHER MEDICAL PROBLEMS:  See the complete list on my note of April 27, 2006.   REVIEW OF SYSTEMS:  He is improving and his review of systems is  negative.   PHYSICAL EXAM:  Blood pressure is 116/80 with a pulse of 90.  The patient is oriented to person, time, and place.  Affect is normal.  LUNGS:  Clear.  Respiratory effort is not labored.  He has no xanthelasma.  There is normal extraocular motion.  There are no carotid bruits.  There is no jugular venous distention.  ABDOMEN:  There are no masses or bruits.  He has no peripheral edema.   No labs are done.   IMPRESSION:  1. Diabetes.  2. Elevated liver function studies being evaluated by Dr. Ouida Sills and      Dr. Karilyn Cota.  3. Coronary disease post intervention.  He is stable.  I will see him      back in 3  months.     Luis Abed, MD, [facility]  Electronically Signed    JDK/MedQ  DD: 05/11/2006  DT: 05/11/2006  Job #: [id number]   cc:   Kingsley Callander. Ouida Sills, MD

## 2010-10-10 NOTE — H&P (Signed)
NAMEJOHNATHYN, VISCOMI                 ACCOUNT NO.:  [id number]   MEDICAL RECORD NO.:  [id number]          PATIENT TYPE:  INP   LOCATION:  A208                          FACILITY:  [facility]   PHYSICIAN:  Hanley Hays. Dechurch, M.D.DATE OF BIRTH:  January 17, 1966   DATE OF ADMISSION:  06/14/2005  DATE OF DISCHARGE:  LH                                HISTORY & PHYSICAL   HISTORY OF PRESENT ILLNESS:  This is a 45 year old, Caucasian male followed  by Dr. Darryll Capers of [facility] with a history of diabetes  mellitus x6 years with multiple admissions for DKA, most recently in  November 2006.  He was in his usual state of health until 1 day prior to  admission when he developed nausea and vomiting unable to maintain p.o.  He  was seen in the [facility] Emergency Room and treated with IV fluids and  Zofran.  Glucose was 182 at the time of discharge and he did have some  relief.  However, today, his nausea persisted and he is unable to maintain  p.o. and presented to the [facility] Emergency Room where he is noted to  have a pH of 7.1, pCO2 of 13.9 on his blood gas with saturation of 97%, pO2  119 on room air.  His glucose on his initial CMP is 497, BUN 22, creatinine  1.8, potassium 5.6 and CO2 10.  The patient is admitted to the hospital with  diagnosis of recurring DKA.   PAST MEDICAL HISTORY:  1.  Multiple admissions secondary to DKA.  2.  Status post cholecystectomy.  3.  History of nephrolithiasis.  4.  Umbilical hernia repair.   MEDICATIONS:  1.  Lantus 20-25 units nightly.  2.  70/30 insulin b.i.d., 20-25 units.   ALLERGIES:  No known drug allergies.   FAMILY HISTORY:  Pertinent for diabetes in his sister.  Father died at age  58 from coronary artery disease.  Mom has hyperlipidemia.   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  No  use of illicit drugs, alcohol or tobacco abuse.  He is not married.  He has  no children.  He lives here in  [place].   REVIEW OF SYSTEMS:  He is active with no shortness of breath, no chest pain,  no diarrhea and no fever.  Otherwise, he was well up until Saturday morning.  He states he has been compliant with his insulin.  He states his glucoses  normally run about 200.  He does not always check his Accu-Cheks routinely.  He is unaware of his hemoglobin A1c.   PHYSICAL EXAMINATION:  GENERAL:  Well-developed, well-nourished male.  The  patient lost 50 pounds at the time of diagnosis of diabetes 6 years prior  and has maintained his current weight.  Today's weight is 137.7.  He is  complaining of being thirsty.  No neurologic symptoms.  VITAL SIGNS:  Blood pressure 122/68, pulse 130, temperature 98.3.  NECK:  Supple.  No JVD.  His oropharynx is dry.  LUNGS:  Clear to auscultation anterior  and posterior.  HEART:  Regular, but tachycardic.  No gallops.  ABDOMEN:  Flat, soft and nontender.  EXTREMITIES:  Without clubbing, cyanosis or edema.  NEUROLOGIC:  Mental status is intact.  Nonfocal neurologic exam.   ASSESSMENT/PLAN:  Diabetic ketoacidosis in a 44 year old who is on insulin  therapy.  He is not on an adjuvant regimen, i.e. angiotensin-converting  enzyme inhibitor/Statin/aspirin and he was highly encouraged to speak with  his primary care physician regarding these issues.  He has received greater  than 3 L of intravenous fluids in the emergency room and continues with  intravenous hydration.  His glucose has come down to 286 at this time and  will continue with intravenous bolus and subcutaneous insulin.  His  phosphorus is 1.6 which will be corrected with intravenous medications with  followup in morning.  Zofran was not very effective.  We will continue with  Phenergan as needed which he tolerates.  Plan is discussed with the patient  who has reasonable understanding.      Hanley Hays Josefine Class, M.D.  Electronically Signed     FED/MEDQ  D:  06/14/2005  T:  06/15/2005  Job:   [id number]

## 2010-10-10 NOTE — H&P (Signed)
NAMEJADAN, HINOJOS                 ACCOUNT NO.:  [id number]   MEDICAL RECORD NO.:  [id number]          PATIENT TYPE:  INP   LOCATION:  IC08                          FACILITY:  [facility]   PHYSICIAN:  Mobolaji B. Bakare, M.D.DATE OF BIRTH:  August 22, 1965   DATE OF ADMISSION:  04/08/2005  DATE OF DISCHARGE:                                HISTORY & PHYSICAL   CHIEF COMPLAINT:  Vomiting and diarrhea for four days, fever, muscles aches  for six days.   HISTORY OF PRESENT ILLNESS:  Mr. Clairmont is a 45 year old Caucasian male with  history of uncontrolled type 1 diabetes mellitus, hemoglobin A1c in October  2006 of 11.1. He has had multiple hospitalizations for DKA. His current  illness started about six days ago with fevers, chills, feeling cold and  muscles aches. There was no upper respiratory congestion. He did not check  his temperature at home. There was no cough. Two days later, he developed  vomiting and diarrhea. He vomited several times a day. There has been no  hematemesis. No abdominal pain. He moves his bowel up to four or five times  a day in the last couple of days. There is no blood or mucus in his  diarrhea. The patient has since been feeling weak and tired. His appetite is  reduced. He saw Dr. Lovell Sheehan yesterday in the office, received some treatment  and nausea and vomiting. He was not feeling better today, and he decided to  come to the emergency room with his mother.   The patient stated he has been compliant with his medications.   On evaluation was noted to have large acetone in his blood, and urine ketone  is greater than 80. Bicarb is 16. He has an anion gap of 17. The patient is  currently on second liter of IV fluids.   He has received influenza vaccine this season.   REVIEW OF SYSTEMS:  Complained of back pain without any radiation. No chest  pain, shortness of breath, diaphoresis, headaches. No dysuria, urgency, or  change in frequency of micturition.   PAST MEDICAL  HISTORY:  1.  Type 1 diabetes mellitus.  2.  Multiple hospitalizations for DKA.  3.  History of renal stones.   PAST SURGICAL HISTORY:  1.  Cholecystectomy.  2.  Umbilical hernia repair.   MEDICATIONS:  1.  Lantus insulin 20 units nightly.  2.  Humalog 70/30 30 units b.i.d.   ALLERGIES:  No known drug allergies.   FAMILY HISTORY:  Father died in his 60s from heart attack. Mother suffers  from hypercholesterolemia. The patient's sister has juvenile diabetes  mellitus.   SOCIAL HISTORY:  He does not drink alcohol. He does not smoke cigarettes. He  does not use drugs. 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   PHYSICAL EXAMINATION:  INITIAL VITAL SIGNS:  Temperature 99, blood pressure  150/90, pulse 127, respiratory rate 20, O2 saturation of 96%.  GENERAL:  On examination, the patient looks unwell, acutely ill, not in  respiratory distress.  HEENT:  Normocephalic, atraumatic head.  Pupils are equal, round, and  reactive to light.  Extraocular movements intact. Mucous membranes dry. No oral thrush. No  carotid bruit. No elevated JVD.  LUNGS:  Clear clinically to auscultation.  CARDIOVASCULAR:  S1 and S2 regular, tachycardic.  ABDOMEN:  Not distended, soft, mild epigastric and left upper quadrant  tenderness. No palpable organomegaly. Bowel sounds present.  EXTREMITIES:  No pedal edema. No calf tenderness. Dorsalis pedis palpable 2+  bilaterally.  SKIN:  He has macular papular rash on the face, prominent in the beard  region and along the hair line, also extending into the scalp. There is no  overt flakiness of this rash. He does have some dandruff on the scalp. Some  similar rash localized in the anterior chest wall. Otherwise no rash on the  trunk.  CENTRAL NERVOUS SYSTEM:  No focal neurological deficit. The patient is  oriented to time, place and person.   INITIAL LABORATORY DATA:  Serum ketones large, white cells 5.1, hemoglobin  18,  hematocrit 52, MCV 84.4, platelets 216, normal differential. Sodium 134,  potassium 3.8, chloride 101, bicarb 16, glucose 147, BUN 34, creatinine 1.0,  bilirubin 1.2, alkaline phosphatase 80, AST 36, ALT 28, total protein 7.2,  albumin 3.5, calcium 8.8. Urinalysis:  Urine is hazy in appearance, specific  gravity of 1.025, pH 5.5, urine glucose 250, ketones greater than 80,  protein 100, nitrite negative, leukocytes negative. Microscopy:  White blood  cells 3 to 6, few bacteria.   ASSESSMENT/PLAN:  Mr. Altadonna is a 45 year old type 1 diabetic who is  presenting with subjective fever, body aches, diarrhea and vomiting. He is  in diabetic ketoacidosis.   1.  Diabetic ketoacidosis.  2.  Gastroenteritis.  3.  Facial rash, possibly seborrheic dermatitis.   PLAN:  Admit to ICU. IV fluids normal saline at 250 cc per hour. Will change  to D5 normal saline if blood glucose is less than 215. The patient will be  kept n.p.o. Regular insulin 10 units IV for one hour, then start insulin  infusion. Monitor Accu-Cheks q.1h. Check BMET q.4h. Correct electrolyte  abnormalities. Send stool for clostridium difficile toxin x2, fecal  leukocytes, stool culture. Check amylase, lipase, influenza A and B nasal  swab. Will use morphine 1  mg IV q.4h. p.r.n. for pain, Zofran 4 mg IV q.8h. around the clock, and  Phenergan 12.5 mg IV q.4h. p.r.n. for nausea and vomiting. Check chest x-ray  PA and lateral. DVT prophylaxis with Lovenox 14 mg subcu daily. GI  prophylaxis Protonix 40 mg IV daily. Will obtain blood culture.      Mobolaji B. Corky Downs, M.D.  Electronically Signed     MBB/MEDQ  D:  04/09/2005  T:  04/09/2005  Job:  [id number]   cc:   Della Goo, M.D.  Fax: [phone]

## 2010-10-10 NOTE — Discharge Summary (Signed)
[name], [name]                 ACCOUNT NO.:  [id number]   MEDICAL RECORD NO.:  [id number]          PATIENT TYPE:  INP   LOCATION:  A217                          FACILITY:  [facility]   PHYSICIAN:  Calvert Cantor, M.D.     DATE OF BIRTH:  22-Nov-1965   DATE OF ADMISSION:  01/08/2005  DATE OF DISCHARGE:  08/18/2006LH                                 DISCHARGE SUMMARY   PRIMARY CARE PHYSICIAN:  Dr. Carolynn Sayers.   DISCHARGE DIAGNOSES:  1.  Diabetic ketoacidosis.  2.  Insulin-dependent diabetes mellitus.   DISCHARGE MEDICATIONS:  1.  The patient is to continue on his Lantus 25 units q.h.s.  2.  Novolin 70/30, 30 units q.12h.  3.  In addition, Vasotec has been added at 10 mg daily for hypertension.   HOSPITAL COURSE:  This is a 45 year old white male who was admitted with DKA  who was treated with an insulin drip and IV fluids.  Subsequently he was  titrated off the drip and was placed on an insulin sliding scale.  The  patient did well.  His hydration status improved, and his blood sugar  decreased.  The patient was discharged the following day.      Calvert Cantor, M.D.  Electronically Signed     SR/MEDQ  D:  02/16/2005  T:  02/16/2005  Job:  [id number]

## 2010-10-10 NOTE — Procedures (Signed)
[place]  Patient:    JEVAN, GAUNT Visit Number: [id number] MRN: [id number]          Service Type: END Location: ENDO Attending Physician:  Charna Elizabeth Dictated by:   Anselmo Rod, M.D. Proc. Date: 07/22/01 Admit Date:  07/22/2001   CC:         Stacie Glaze, M.D. [facility]   Procedure Report  DATE OF BIRTH:  1966-04-30.  REFERRING PHYSICIAN:  Stacie Glaze, M.D. [facility]  PROCEDURE PERFORMED:  Esophagogastroduodenoscopy with biopsies.  ENDOSCOPIST:  Anselmo Rod, M.D.  INSTRUMENT USED:  Olympus video panendoscope.  INDICATIONS FOR PROCEDURE:  The patient is a 45 year old white male with a history of severe chest pressure, epigastric and right upper quadrant pain. The patient has normal abdominal ultrasound and EKG at [facility] Emergency Department yesterday.  The patient had guaiac positive stools on examination in the office.  PREPROCEDURE PREPARATION:  Informed consent was procured from the patient. The patient was fasted for eight hours prior to the procedure and CBGs were checked prior to the procedure.  Glucose was 219.  PREPROCEDURE PHYSICAL:  The patient had stable vital signs.  Neck supple. Chest clear to auscultation.  S1, S2 regular.  Abdomen soft with right upper quadrant epigastric tenderness on palpation, no guarding, no rigidity, no hepatosplenomegaly.  DESCRIPTION OF PROCEDURE:  The patient was placed in left lateral decubitus position and sedated with 50 mg of Demerol and 6 mg of Versed intravenously. Once the patient was adequately sedated and maintained on low-flow oxygen and continuous cardiac monitoring, the Olympus video panendoscope was advanced through the mouthpiece, over the tongue, into the esophagus under direct vision.  The entire esophagus appeared normal and without lesion.  There was no evidence of a Schatzkis ring, esophagitis, Barretts mucosa or masses. The scope was  then advanced to the stomach.  There was patchy gastritis with prominent erythema in the antral area, consistent with antral gastritis. There was old heme seen in the antrum as well.  Biopsies were done to ____________ Helicobacter pylori by CLO test.  The duodenal bulb and the proximal small bowel distal to the bulb up to 60 cm appeared normal.  There was no outlet obstruction.  The patient tolerated the procedure well without complications.  IMPRESSION: 1. Normal-appearing esophagus and proximal small bowel. 2. Antral gastritis with old heme.  Biopsies done for Helicobacter pylori by    CLO test.  RECOMMENDATION: 1. Continue proton pump inhibitors. 2. Treat with antibiotics if CLO positive. 3. Proceed with a HIDA scan to evaluate gallbladder function. 4. Outpatient follow-up within the next week. 5. Cardiac evaluation as soon as possible. Dictated by:   Anselmo Rod, M.D. Attending Physician:  Charna Elizabeth DD:  07/22/01 TD:  07/22/01 Job: [id number]

## 2010-10-10 NOTE — Discharge Summary (Signed)
[name], [name]                ACCOUNT NO.:  [id number]   MEDICAL RECORD NO.:  [id number]          PATIENT TYPE:  INP   LOCATION:  A226                          FACILITY:  [facility]   PHYSICIAN:  Osvaldo Shipper, MD     DATE OF BIRTH:  July 30, 1965   DATE OF ADMISSION:  09/01/2004  DATE OF DISCHARGE:  04/12/2006LH                                 DISCHARGE SUMMARY   PRIMARY CARE DOCTOR:  Stacie Glaze, M.D. [facility].   DISCHARGE DIAGNOSES:  1.  Diabetic ketoacidosis, resolved.  2.  Noncardiac chest pain.   Please review H&P dictated, September 01, 2004, for details regarding the  patient's presenting illness.   BRIEF HOSPITAL COURSE:  1.  DKA.  The patient was admitted with nausea, vomiting, abdominal pain.      He was found to have DKA.  There was no clear precipitating factor for      his diabetic ketoacidosis.  The patient has been diagnosed with diabetes      four years ago and has been insulin-dependent about two years ago.  The      patient was admitted to telemetry unit.  He was started on IV fluids,      insulin drip.  His CBGs and BMPs were monitored.  Eventually his      cirrhosis resolved.  His symptoms improved, and he was switched over      from IV insulin to subcutaneous insulin.  He also start tolerating p.o.      intake.  On the day of discharge, his blood sugars were better      controlled.  His insulin was adjusted as mentioned below.  He was asked      to continue measuring his CBGs and call his PMD for further adjustment      to his insulin regimen.  2.  At the time opf admission the patient was also complaining of some      retrosternal to right sided chest pain which was pretty atypical for      cardiac etiology.  However, the patient has a significant family history      of cardiac disease, hence we admitted him to telemetry and also ruled      him out.  The patient did rule out for acute coronary syndrome.  His EKG      did not show any ischemic changes.  The  patient's symptoms subsequently      subsided with PPI.  It was thought that his pain was probably related to      gastroenterology symptoms rather than cardiac etiology.  The patient      again needs to follow with his PMD for the same.  Of note, the patient      also had a stress test a couple of months ago which was normal.   DISCHARGE MEDICATIONS:  1.  Lantus insulin 25 units subcutaneously every night at bedtime.  2.  Novolin 70/30, 25 units subcu b.i.d.  3.  Protonix 40 mg p.o. daily.   FOLLOWUP CARE:  With Dr. Darryll Capers in one  week.   DIET:  A 2,000 calorie ADA diet.   PHYSICAL ACTIVITY:  No restrictions.   ADMITTING STUDIES:  During this admission including a chest x-ray which was  unremarkable and an abdominal single view which was also unremarkable.   The patient was asked to seek medical attention if he develops nausea,  vomiting, abdominal pain, or if his blood sugar is extremely elevated.      GK/MEDQ  D:  09/03/2004  T:  09/03/2004  Job:  [id number]   cc:   Stacie Glaze, M.D. [facility]

## 2010-11-10 ENCOUNTER — Encounter: Payer: Self-pay | Admitting: Cardiology

## 2010-11-19 ENCOUNTER — Other Ambulatory Visit: Payer: Self-pay | Admitting: *Deleted

## 2010-11-19 MED ORDER — CHLORTHALIDONE 25 MG PO TABS: 25.0000 mg | ORAL_TABLET | Freq: Every day | ORAL | Status: DC

## 2010-11-19 MED ORDER — ATORVASTATIN CALCIUM 80 MG PO TABS: 80.0000 mg | ORAL_TABLET | Freq: Every day | ORAL | Status: DC

## 2010-11-19 MED ORDER — CLOPIDOGREL BISULFATE 75 MG PO TABS: 75.0000 mg | ORAL_TABLET | Freq: Every day | ORAL | Status: DC

## 2010-11-19 MED ORDER — METOPROLOL SUCCINATE ER 100 MG PO TB24: 100.0000 mg | ORAL_TABLET | Freq: Every day | ORAL | Status: DC

## 2010-11-19 MED ORDER — LISINOPRIL 10 MG PO TABS: 10.0000 mg | ORAL_TABLET | Freq: Every day | ORAL | Status: DC

## 2010-12-22 ENCOUNTER — Encounter: Payer: Self-pay | Admitting: Cardiology

## 2010-12-25 DIAGNOSIS — A5149 Other secondary syphilitic conditions: Secondary | ICD-10-CM | POA: Insufficient documentation

## 2010-12-25 DIAGNOSIS — I2510 Atherosclerotic heart disease of native coronary artery without angina pectoris: Secondary | ICD-10-CM | POA: Insufficient documentation

## 2010-12-25 DIAGNOSIS — I2583 Coronary atherosclerosis due to lipid rich plaque: Secondary | ICD-10-CM | POA: Insufficient documentation

## 2010-12-25 DIAGNOSIS — R945 Abnormal results of liver function studies: Secondary | ICD-10-CM | POA: Insufficient documentation

## 2010-12-25 DIAGNOSIS — R7989 Other specified abnormal findings of blood chemistry: Secondary | ICD-10-CM | POA: Insufficient documentation

## 2010-12-26 ENCOUNTER — Ambulatory Visit: Payer: Self-pay | Admitting: Cardiology

## 2011-01-04 ENCOUNTER — Inpatient Hospital Stay (HOSPITAL_COMMUNITY)
Admission: EM | Admit: 2011-01-04 | Discharge: 2011-01-05 | DRG: 693 | Disposition: A | Discharge status: Home / Self Care | Payer: 59 | Attending: Internal Medicine | Admitting: Internal Medicine

## 2011-01-04 ENCOUNTER — Encounter (HOSPITAL_COMMUNITY): Payer: Self-pay

## 2011-01-04 ENCOUNTER — Emergency Department (HOSPITAL_COMMUNITY): Payer: 59

## 2011-01-04 DIAGNOSIS — E1110 Type 2 diabetes mellitus with ketoacidosis without coma: Secondary | ICD-10-CM | POA: Diagnosis present

## 2011-01-04 DIAGNOSIS — R945 Abnormal results of liver function studies: Secondary | ICD-10-CM

## 2011-01-04 DIAGNOSIS — E785 Hyperlipidemia, unspecified: Secondary | ICD-10-CM | POA: Diagnosis present

## 2011-01-04 DIAGNOSIS — A5149 Other secondary syphilitic conditions: Secondary | ICD-10-CM

## 2011-01-04 DIAGNOSIS — N200 Calculus of kidney: Secondary | ICD-10-CM | POA: Diagnosis present

## 2011-01-04 DIAGNOSIS — N201 Calculus of ureter: Principal | ICD-10-CM | POA: Diagnosis present

## 2011-01-04 DIAGNOSIS — R000 Tachycardia, unspecified: Secondary | ICD-10-CM

## 2011-01-04 DIAGNOSIS — R7989 Other specified abnormal findings of blood chemistry: Secondary | ICD-10-CM

## 2011-01-04 DIAGNOSIS — Z794 Long term (current) use of insulin: Secondary | ICD-10-CM

## 2011-01-04 DIAGNOSIS — I2510 Atherosclerotic heart disease of native coronary artery without angina pectoris: Secondary | ICD-10-CM | POA: Insufficient documentation

## 2011-01-04 DIAGNOSIS — E119 Type 2 diabetes mellitus without complications: Secondary | ICD-10-CM

## 2011-01-04 DIAGNOSIS — I10 Essential (primary) hypertension: Secondary | ICD-10-CM

## 2011-01-04 DIAGNOSIS — E1310 Other specified diabetes mellitus with ketoacidosis without coma: Secondary | ICD-10-CM | POA: Diagnosis present

## 2011-01-04 DIAGNOSIS — B20 Human immunodeficiency virus [HIV] disease: Secondary | ICD-10-CM | POA: Diagnosis present

## 2011-01-04 LAB — GLUCOSE, CAPILLARY
Glucose-Capillary: 498 mg/dL — ABNORMAL HIGH (ref 70–99)
Glucose-Capillary: 428 mg/dL — ABNORMAL HIGH (ref 70–99)
Glucose-Capillary: 317 mg/dL — ABNORMAL HIGH (ref 70–99)
Glucose-Capillary: 204 mg/dL — ABNORMAL HIGH (ref 70–99)
Glucose-Capillary: 169 mg/dL — ABNORMAL HIGH (ref 70–99)
Glucose-Capillary: 140 mg/dL — ABNORMAL HIGH (ref 70–99)
Glucose-Capillary: 146 mg/dL — ABNORMAL HIGH (ref 70–99)
Glucose-Capillary: 142 mg/dL — ABNORMAL HIGH (ref 70–99)
Glucose-Capillary: 171 mg/dL — ABNORMAL HIGH (ref 70–99)
Glucose-Capillary: 460 mg/dL — ABNORMAL HIGH (ref 70–99)

## 2011-01-04 LAB — BLOOD GAS, VENOUS
Acid-Base Excess: 5.2 mmol/L — ABNORMAL HIGH (ref 0.0–2.0)
Bicarbonate: 20 mEq/L (ref 20.0–24.0)
FIO2: 21 %
O2 Saturation: 92.1 %
Patient temperature: 37
TCO2: 17.9 mmol/L (ref 0–100)
pCO2, Ven: 41.7 mmHg — ABNORMAL LOW (ref 45.0–50.0)
pH, Ven: 7.304 — ABNORMAL HIGH (ref 7.250–7.300)
pO2, Ven: 69.1 mmHg — ABNORMAL HIGH (ref 30.0–45.0)

## 2011-01-04 LAB — URINE MICROSCOPIC-ADD ON

## 2011-01-04 LAB — BASIC METABOLIC PANEL
BUN: 27 mg/dL — ABNORMAL HIGH (ref 6–23)
BUN: 24 mg/dL — ABNORMAL HIGH (ref 6–23)
BUN: 24 mg/dL — ABNORMAL HIGH (ref 6–23)
BUN: 22 mg/dL (ref 6–23)
CO2: 16 mEq/L — ABNORMAL LOW (ref 19–32)
CO2: 23 mEq/L (ref 19–32)
CO2: 21 mEq/L (ref 19–32)
CO2: 21 mEq/L (ref 19–32)
Calcium: 9.9 mg/dL (ref 8.4–10.5)
Calcium: 8.8 mg/dL (ref 8.4–10.5)
Calcium: 8.8 mg/dL (ref 8.4–10.5)
Calcium: 8.4 mg/dL (ref 8.4–10.5)
Chloride: 94 mEq/L — ABNORMAL LOW (ref 96–112)
Chloride: 105 mEq/L (ref 96–112)
Chloride: 104 mEq/L (ref 96–112)
Chloride: 103 mEq/L (ref 96–112)
Creatinine, Ser: 0.98 mg/dL (ref 0.50–1.35)
Creatinine, Ser: 0.9 mg/dL (ref 0.50–1.35)
Creatinine, Ser: 0.72 mg/dL (ref 0.50–1.35)
Creatinine, Ser: 0.76 mg/dL (ref 0.50–1.35)
GFR calc Af Amer: >60 mL/min (ref >60)
GFR calc Af Amer: >60 mL/min (ref >60)
GFR calc Af Amer: >60 mL/min (ref >60)
GFR calc Af Amer: >60 mL/min (ref >60)
GFR calc non Af Amer: >60 mL/min (ref >60)
GFR calc non Af Amer: >60 mL/min (ref >60)
GFR calc non Af Amer: >60 mL/min (ref >60)
GFR calc non Af Amer: >60 mL/min (ref >60)
Glucose, Bld: 590 mg/dL (ref 70–99)
Glucose, Bld: 218 mg/dL — ABNORMAL HIGH (ref 70–99)
Glucose, Bld: 144 mg/dL — ABNORMAL HIGH (ref 70–99)
Glucose, Bld: 146 mg/dL — ABNORMAL HIGH (ref 70–99)
Potassium: 3.9 mEq/L (ref 3.5–5.1)
Potassium: 4 mEq/L (ref 3.5–5.1)
Potassium: 4 mEq/L (ref 3.5–5.1)
Potassium: 3.8 mEq/L (ref 3.5–5.1)
Sodium: 133 mEq/L — ABNORMAL LOW (ref 135–145)
Sodium: 140 mEq/L (ref 135–145)
Sodium: 139 mEq/L (ref 135–145)
Sodium: 138 mEq/L (ref 135–145)

## 2011-01-04 LAB — CBC
HCT: 45.9 % (ref 39.0–52.0)
Hemoglobin: 16 g/dL (ref 13.0–17.0)
MCH: 29.7 pg (ref 26.0–34.0)
MCHC: 34.9 g/dL (ref 30.0–36.0)
MCV: 85.2 fL (ref 78.0–100.0)
Platelets: 323 10*3/uL (ref 150–400)
RBC: 5.39 MIL/uL (ref 4.22–5.81)
RDW: 12 % (ref 11.5–15.5)
WBC: 9.2 10*3/uL (ref 4.0–10.5)

## 2011-01-04 LAB — URINALYSIS, ROUTINE W REFLEX MICROSCOPIC
Bilirubin Urine: NEGATIVE
Glucose, UA: >1000 mg/dL — AB
Leukocytes, UA: NEGATIVE
Nitrite: NEGATIVE
Protein, ur: NEGATIVE mg/dL
Specific Gravity, Urine: 1.01 (ref 1.005–1.030)
Urobilinogen, UA: 0.2 mg/dL (ref 0.0–1.0)
pH: 5.5 (ref 5.0–8.0)

## 2011-01-04 LAB — LACTIC ACID, PLASMA: Lactic Acid, Venous: 1.4 mmol/L (ref 0.5–2.2)

## 2011-01-04 LAB — MRSA PCR SCREENING: MRSA by PCR: NEGATIVE

## 2011-01-04 MED ORDER — INSULIN ASPART 100 UNIT/ML ~~LOC~~ SOLN: 0.0000 [IU] | Freq: Every day | SUBCUTANEOUS | Status: DC

## 2011-01-04 MED ORDER — KETOROLAC TROMETHAMINE 30 MG/ML IJ SOLN
30.0000 mg | Freq: Once | INTRAMUSCULAR | Status: AC
  Administered 2011-01-04: 30 mg via INTRAVENOUS
  Filled 2011-01-04: qty 1

## 2011-01-04 MED ORDER — ACETAMINOPHEN 325 MG PO TABS: 650.0000 mg | ORAL_TABLET | ORAL | Status: DC | PRN

## 2011-01-04 MED ORDER — ENOXAPARIN SODIUM 80 MG/0.8ML ~~LOC~~ SOLN
40.0000 mg | SUBCUTANEOUS | Status: DC
  Administered 2011-01-04: 40 mg via SUBCUTANEOUS
  Filled 2011-01-04: qty 0.8

## 2011-01-04 MED ORDER — LISINOPRIL 10 MG PO TABS
10.0000 mg | ORAL_TABLET | Freq: Every day | ORAL | Status: DC
  Administered 2011-01-04: 10 mg via ORAL
  Filled 2011-01-04: qty 1

## 2011-01-04 MED ORDER — ROSUVASTATIN CALCIUM 20 MG PO TABS
40.0000 mg | ORAL_TABLET | Freq: Every day | ORAL | Status: DC
  Administered 2011-01-04: 40 mg via ORAL
  Filled 2011-01-04: qty 2

## 2011-01-04 MED ORDER — DEXTROSE 50 % IV SOLN: 25.0000 mL | INTRAVENOUS | Status: DC | PRN

## 2011-01-04 MED ORDER — HYDROMORPHONE HCL 1 MG/ML IJ SOLN: 1.0000 mg | INTRAMUSCULAR | Status: DC | PRN

## 2011-01-04 MED ORDER — POTASSIUM CHLORIDE 10 MEQ/100ML IV SOLN
10.0000 meq | INTRAVENOUS | Status: AC
  Administered 2011-01-04 (×2): 10 meq via INTRAVENOUS
  Filled 2011-01-04: qty 200

## 2011-01-04 MED ORDER — SODIUM CHLORIDE 0.9 % IV BOLUS (SEPSIS)
1000.0000 mL | Freq: Once | INTRAVENOUS | Status: AC
  Administered 2011-01-04: 1000 mL via INTRAVENOUS

## 2011-01-04 MED ORDER — INSULIN ASPART 100 UNIT/ML ~~LOC~~ SOLN
10.0000 [IU] | Freq: Once | SUBCUTANEOUS | Status: AC
  Administered 2011-01-04: 10 [IU] via SUBCUTANEOUS

## 2011-01-04 MED ORDER — CLOPIDOGREL BISULFATE 75 MG PO TABS
75.0000 mg | ORAL_TABLET | Freq: Every day | ORAL | Status: DC
  Administered 2011-01-04: 75 mg via ORAL
  Filled 2011-01-04: qty 1

## 2011-01-04 MED ORDER — ONDANSETRON HCL 4 MG/2ML IJ SOLN: 4.0000 mg | Freq: Four times a day (QID) | INTRAMUSCULAR | Status: DC | PRN

## 2011-01-04 MED ORDER — INSULIN GLARGINE 100 UNIT/ML ~~LOC~~ SOLN
50.0000 [IU] | Freq: Every day | SUBCUTANEOUS | Status: DC
  Administered 2011-01-04: 50 [IU] via SUBCUTANEOUS
  Filled 2011-01-04: qty 3

## 2011-01-04 MED ORDER — INSULIN ASPART 100 UNIT/ML ~~LOC~~ SOLN
SUBCUTANEOUS | Status: AC
  Administered 2011-01-04: 4 [IU] via SUBCUTANEOUS
  Filled 2011-01-04: qty 3

## 2011-01-04 MED ORDER — SODIUM CHLORIDE 0.9 % IV SOLN
INTRAVENOUS | Status: DC
  Administered 2011-01-04 – 2011-01-05 (×2): via INTRAVENOUS

## 2011-01-04 MED ORDER — ONDANSETRON HCL 4 MG/2ML IJ SOLN
4.0000 mg | Freq: Once | INTRAMUSCULAR | Status: AC
  Administered 2011-01-04: 4 mg via INTRAVENOUS
  Filled 2011-01-04: qty 2

## 2011-01-04 MED ORDER — SODIUM CHLORIDE 0.9 % IV SOLN
Freq: Once | INTRAVENOUS | Status: AC
  Administered 2011-01-04: 09:00:00 via INTRAVENOUS

## 2011-01-04 MED ORDER — SODIUM CHLORIDE 0.9 % IV SOLN: INTRAVENOUS | Status: AC

## 2011-01-04 MED ORDER — INSULIN ASPART 100 UNIT/ML ~~LOC~~ SOLN
8.0000 [IU] | Freq: Once | SUBCUTANEOUS | Status: AC
  Administered 2011-01-04: 8 [IU] via SUBCUTANEOUS

## 2011-01-04 MED ORDER — SODIUM CHLORIDE 0.9 % IV SOLN
INTRAVENOUS | Status: DC
  Administered 2011-01-04: 3.7 [IU]/h via INTRAVENOUS
  Filled 2011-01-04: qty 1

## 2011-01-04 MED ORDER — DEXTROSE-NACL 5-0.45 % IV SOLN
INTRAVENOUS | Status: DC
  Administered 2011-01-04: 14:00:00 via INTRAVENOUS

## 2011-01-04 MED ORDER — METOPROLOL SUCCINATE ER 50 MG PO TB24
100.0000 mg | ORAL_TABLET | Freq: Every day | ORAL | Status: DC
  Administered 2011-01-04: 100 mg via ORAL
  Filled 2011-01-04: qty 2

## 2011-01-04 MED ORDER — SODIUM CHLORIDE 0.9 % IV SOLN
INTRAVENOUS | Status: DC
  Filled 2011-01-04: qty 1

## 2011-01-04 MED ORDER — ASPIRIN 81 MG PO CHEW
81.0000 mg | CHEWABLE_TABLET | Freq: Every day | ORAL | Status: DC
  Administered 2011-01-04: 81 mg via ORAL
  Filled 2011-01-04: qty 1

## 2011-01-04 MED ORDER — INSULIN ASPART 100 UNIT/ML ~~LOC~~ SOLN
0.0000 [IU] | Freq: Three times a day (TID) | SUBCUTANEOUS | Status: DC
  Administered 2011-01-04 – 2011-01-05 (×2): 4 [IU] via SUBCUTANEOUS

## 2011-01-04 MED ORDER — EFAVIRENZ-EMTRICITAB-TENOFOVIR 600-200-300 MG PO TABS
1.0000 | ORAL_TABLET | Freq: Every day | ORAL | Status: DC
  Administered 2011-01-04: 1 via ORAL
  Filled 2011-01-04 (×3): qty 1

## 2011-01-04 MED ORDER — HYDROMORPHONE HCL 1 MG/ML IJ SOLN
1.0000 mg | Freq: Once | INTRAMUSCULAR | Status: AC
  Administered 2011-01-04: 1 mg via INTRAVENOUS
  Filled 2011-01-04: qty 1

## 2011-01-04 NOTE — Progress Notes (Signed)
Paged Dr. Juanetta Gosling; four in-target blood sugars; orders received for transition off insulin drip

## 2011-01-04 NOTE — ED Notes (Signed)
Left flank pain since 2am, +n/v

## 2011-01-04 NOTE — ED Provider Notes (Signed)
Scribed for Glynn Octave, MD, the patient was seen in room [facility]. This chart was scribed by [employer]. The patient's care started at 6:50AM   CSN: [id number] Arrival date & time: 01/04/2011  6:46 AM  Chief Complaint  Patient presents with  . Flank Pain   Patient is a 45 y.o. male presenting with flank pain. The history is provided by the patient.  Flank Pain This is a new problem. The current episode started yesterday. The problem has been gradually worsening. Associated symptoms include abdominal pain. Pertinent negatives include no chest pain.   [name] is a 45 y.o. male with a history of diabetes mellitus, HIV, and nephrolithiasis, who presents to the Emergency Department complaining of dull but worsening left lower flank pain. Pain radiates from the left lower back to the left lower abdomen, but does not radiate to the testicles. Patient has a history of similar symptoms from kidney stones 15 years ago. Patient denies associated chest pain, fever, nausea and vomiting. History of cholecystectomy.  HPI ELEMENTS:  Location: left lower back and left lower abdomen  Timing: Constant Quality: dull Severity: 10/10 Context: as above  Associated symptoms: as above   Past Medical History  Diagnosis Date  . Diabetes mellitus type II   . HTN (hypertension)   . HIV (human immunodeficiency virus infection)   . 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  . 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 CAD (coronary artery disease)     DES.  /  ... Later (10/2007) intervention for in-stent restenosis ;  EF 60%... catheterization... June, 2009  . Sinus tachycardia     mild at rest.... October, 2010  . Kidney calculi     Past Surgical History  Procedure Date  . Cholecystectomy   . Coronary stent placement    MEDICATIONS:  Previous  Medications   ASPIRIN 81 MG TABLET    Take 81 mg by mouth daily.     ATORVASTATIN (LIPITOR) 80 MG TABLET    Take 1 tablet (80 mg total) by mouth daily.   CHLORTHALIDONE (HYGROTON) 25 MG TABLET    Take 1 tablet (25 mg total) by mouth daily.   CLOPIDOGREL (PLAVIX) 75 MG TABLET    Take 1 tablet (75 mg total) by mouth daily.   EFAVIRENZ-EMTRICTABINE-TENOFOVIR (ATRIPLA) 600-200-300 MG PER TABLET    Take 1 tablet by mouth at bedtime.     INSULIN ASPART (NOVOLOG FLEXPEN) 100 UNIT/ML INJECTION    Inject 8 Units into the skin 3 (three) times daily before meals. Base dose is 8 units, adjust with 1 unit increments based on  Blood Glucose levels   INSULIN ASPART PROTAMINE-INSULIN ASPART (NOVOLOG MIX 70/30) (70-30) 100 UNIT/ML INJECTION    Inject into the skin. Use as directed   INSULIN GLARGINE (LANTUS SOLOSTAR) 100 UNIT/ML INJECTION    Inject 70 Units into the skin at bedtime.     INSULIN GLARGINE (LANTUS) 100 UNIT/ML INJECTION       LISINOPRIL (PRINIVIL,ZESTRIL) 10 MG TABLET    Take 1 tablet (10 mg total) by mouth daily.     ALLERGIES:  Allergies as of 01/04/2011  . (No Known Allergies)     Family History  Problem Relation Age of Onset  . Anemia    . Diabetes      History  Substance Use Topics  . Smoking status: Never Smoker   .  Smokeless tobacco: Not on file  . Alcohol Use: No      Review of Systems  Constitutional: Positive for diaphoresis. Negative for fever and chills.  HENT: Negative for neck pain.   Eyes: Negative for photophobia and visual disturbance.  Cardiovascular: Negative for chest pain.  Gastrointestinal: Positive for abdominal pain. Negative for nausea, vomiting and diarrhea.  Genitourinary: Positive for dysuria and flank pain. Negative for testicular pain.  Musculoskeletal: Positive for back pain.  Skin: Negative for rash and wound.  Neurological: Negative for dizziness.  Psychiatric/Behavioral: Negative for confusion.  All other systems reviewed and are  negative.    Physical Exam  BP 152/97  Pulse 110  Temp(Src) 98.4 F (36.9 C) (Oral)  Resp 20  Ht 5\' 6"  (1.676 m)  Wt 190 lb (86.183 kg)  BMI 30.67 kg/m2  SpO2 100%  ED Triage Vitals  Enc Vitals Group     BP 01/04/11 0642 152/97 mmHg     Pulse Rate 01/04/11 0642 110      Resp 01/04/11 0642 20      Temp 01/04/11 0642 98.4 F (36.9 C)     Temp src 01/04/11 0642 Oral     SpO2 01/04/11 0642 100 %     Weight 01/04/11 0642 190 lb (86.183 kg)     Height 01/04/11 0642 5\' 6"  (1.676 m)     Head Cir --      Peak Flow --      Pain Score 01/04/11 0642 Ten     Pain Loc --      Pain Edu? --      Excl. in GC? --      Physical Exam  Nursing note reviewed. Constitutional: He is oriented to person, place, and time. He appears well-developed. He appears distressed.       Uncomfortable, holding lower left back and pacing around the room  HENT:  Head: Atraumatic.  Mouth/Throat: Oropharynx is clear and moist. No oropharyngeal exudate.  Eyes: Pupils are equal, round, and reactive to light.  Neck: Neck supple.  Cardiovascular: Regular rhythm and normal heart sounds.        Tachycardic  Pulmonary/Chest: Breath sounds normal. He has no wheezes. He has no rales.  Abdominal: Soft. Bowel sounds are normal. There is tenderness.       Left lower quadrant tenderness to the left groin and left testicle  Neurological: He is alert and oriented to person, place, and time. No cranial nerve deficit.  Skin: Skin is warm.    Procedures  OTHER DATA REVIEWED: Nursing notes, vital signs, and past medical records reviewed.  DIAGNOSTIC STUDIES: Oxygen Saturation is 100% on room air, normal by my interpretation.     LABS / RADIOLOGY:  Results for orders placed during the hospital encounter of 01/04/11  BASIC METABOLIC PANEL      Component Value Range   Sodium 133 (*) 135 - 145 (mEq/L)   Potassium 3.9  3.5 - 5.1 (mEq/L)   Chloride 94 (*) 96 - 112 (mEq/L)   CO2 16 (*) 19 - 32 (mEq/L)   Glucose,  Bld 590 (*) 70 - 99 (mg/dL)   BUN 27 (*) 6 - 23 (mg/dL)   Creatinine, Ser 1.61  0.50 - 1.35 (mg/dL)   Calcium 9.9  8.4 - 09.6 (mg/dL)   GFR calc non Af Amer >60  >60 (mL/min)   GFR calc Af Amer >60  >60 (mL/min)  URINALYSIS, ROUTINE W REFLEX MICROSCOPIC      Component Value Range  Color, Urine YELLOW  YELLOW    Appearance CLEAR  CLEAR    Specific Gravity, Urine 1.010  1.005 - 1.030    pH 5.5  5.0 - 8.0    Glucose, UA >1000 (*) NEGATIVE (mg/dL)   Hgb urine dipstick MODERATE (*) NEGATIVE    Bilirubin Urine NEGATIVE  NEGATIVE    Ketones, ur TRACE (*) NEGATIVE (mg/dL)   Protein, ur NEGATIVE  NEGATIVE (mg/dL)   Urobilinogen, UA 0.2  0.0 - 1.0 (mg/dL)   Nitrite NEGATIVE  NEGATIVE    Leukocytes, UA NEGATIVE  NEGATIVE   URINE MICROSCOPIC-ADD ON      Component Value Range   Squamous Epithelial / LPF RARE  RARE    WBC, UA 0-2  <3 (WBC/hpf)   RBC / HPF 3-6  <3 (RBC/hpf)   Bacteria, UA RARE  RARE    CT Abdomen / Pelvis: Interpreted by Radiologist Dr.SRIYESH KRISHNAN, 3 mm left ureteral calculus at the UVJ.  No hydronephrosis.  Original Report    ED COURSE / COORDINATION OF CARE: 06:53: EDMD concerned with nephrolitiasis, BMP, UA and CT abdomen ordered to rule out Kidney stones. 07:55 - Re-examined by ED physician, patient laying still/comfortable on gurney. Reports pain and nausea improved s/p dilaudid and zofran. ED physician discussed UA with patient, which shows blood and is concerning for a kidney stone. Patient persists to have LLQ abdominal pain. 08:00 - Chem panel returned and shows hyperglycemia with an elevated glucose of 590 and there are ketone present in urine. EDMD discussed possibility of DKA with the patient, insulin ordered. 09:05 - - The case was discussed with Hospitalist, Dr. Karilyn Cota. Hospitalist states they don't admit for PCP Dr Ouida Sills 09:10 - The case was discussed with Dr Juanetta Gosling regarding admission.    MDM: Flank pain with nausea and vomiting, r/o  nephrolithiasis   IMPRESSION: Diagnoses that have been ruled out:  Diagnoses that are still under consideration:  Final diagnoses:  Diabetic ketoacidosis  Nephrolithiasis    PLAN:  Probable admission    MEDICATIONS GIVEN IN THE E.D.  Medications  sodium chloride 0.9 % bolus 1,000 mL (1000 mL Intravenous Given 01/04/11 0810)  insulin regular (HUMULIN R,NOVOLIN R) 1 Units/mL in sodium chloride 0.9 % 100 mL infusion (3.7 Units/hr Intravenous New Bag 01/04/11 0943)  rosuvastatin (CRESTOR) tablet 40 mg (not administered)  clopidogrel (PLAVIX) tablet 75 mg (not administered)  lisinopril (PRINIVIL,ZESTRIL) tablet 10 mg (not administered)  metoprolol (TOPROL-XL) 24 hr tablet 100 mg (not administered)  aspirin tablet 81 mg (not administered)  efavirenz-emtrictabine-tenofovir (ATRIPLA) 600-200-300 MG per tablet 1 tablet (not administered)  0.9 %  sodium chloride infusion (not administered)  dextrose 5 %-0.45 % sodium chloride infusion (not administered)  insulin regular (HUMULIN R,NOVOLIN R) 1 Units/mL in sodium chloride 0.9 % 100 mL infusion (not administered)  dextrose 50 % solution 25 mL (not administered)  0.9 %  sodium chloride infusion (not administered)  potassium chloride 10 mEq in 100 mL IVPB (not administered)  metoprolol (TOPROL-XL) 100 MG 24 hr tablet (not administered)  enoxaparin (LOVENOX) injection 40 mg (not administered)  HYDROmorphone (DILAUDID) injection 1 mg (not administered)  acetaminophen (TYLENOL) tablet 650 mg (not administered)  ondansetron (ZOFRAN) injection 4 mg (not administered)  insulin aspart (NOVOLOG FLEXPEN) 100 UNIT/ML injection (not administered)  insulin glargine (LANTUS SOLOSTAR) 100 UNIT/ML injection (not administered)  0.9 %  sodium chloride infusion (  Intravenous New Bag 01/04/11 0857)  sodium chloride 0.9 % bolus 1,000 mL (1000 mL Intravenous Given 01/04/11 0718)  ondansetron [facility]) injection 4 mg (4 mg Intravenous Given 01/04/11 0715)   HYDROmorphone (DILAUDID) injection 1 mg (1 mg Intravenous Given 01/04/11 0715)  ketorolac (TORADOL) injection 30 mg (30 mg Intravenous Given 01/04/11 0715)  insulin aspart (novoLOG) injection 10 Units (10 Units Subcutaneous Given 01/04/11 0811)     DISCHARGE MEDICATIONS: New Prescriptions   No medications on file    I personally performed the services described in this documentation, which was scribed in my presence.  The recorded information has been reviewed and considered.        Glynn Octave, MD 01/04/11 1539

## 2011-01-04 NOTE — ED Notes (Signed)
REPORT GIVEN TO MICHELLE IN [facility] ICU. RN TO BRING PT UP AT 1015. NAD NOTED.

## 2011-01-04 NOTE — ED Notes (Signed)
CRITICAL VALUE ALERT  Critical value received: glucose 590  Date of notification:  01/04/2011 Time of notification: 0755  Critical value read back:yes  Nurse who received alert:  Vernell Barrier  MD notified (1st page): Dr. Manus Gunning Time of first page:  [phone]  MD notified (2nd page):  Time of second page:  Responding MD: Dr. Manus Gunning  Time MD responded: [phone]

## 2011-01-04 NOTE — H&P (Signed)
[name] MRN: [id number] DOB/AGE: April 26, 1966 45 y.o. Primary Care Physician:FAGAN,ROY, MD Admit date: 01/04/2011 Chief Complaint: flank pain, DKA HPI: He woke up this AM with severe flank pain. Previous history of kidney stone but none recently. He tried to manage at home, but pain became unbearable and he came to ED. He was found to also have DKA  Past Medical History  Diagnosis Date  . Diabetes mellitus type II   . HTN (hypertension)   . HIV (human immunodeficiency virus infection)   . 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  . 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 CAD (coronary artery disease)     DES.  /  ... Later (10/2007) intervention for in-stent restenosis ;  EF 60%... catheterization... June, 2009  . Sinus tachycardia     mild at rest.... October, 2010  . Kidney calculi    He has done well with HIV with undetectable viral load.blood sugar generally well controlled     Family History  Problem Relation Age of Onset  . Anemia    . Diabetes      Social History:  reports that he has never smoked. He does not have any smokeless tobacco history on file. He reports that he does not drink alcohol or use illicit drugs.   Allergies: No Known Allergies  Medications Prior to Admission  Medication Dose Route Frequency Provider Last Rate Last Dose  . 0.9 %  sodium chloride infusion   Intravenous Once Omar Octave, MD 150 mL/hr at 01/04/11 0857    . 0.9 %  sodium chloride infusion   Intravenous Continuous [name]      . 0.9 %  sodium chloride infusion   Intravenous Continuous [name]      . acetaminophen (TYLENOL) tablet 650 mg  650 mg Oral Q4H PRN [name]      . aspirin tablet 81 mg  81 mg Oral Daily [name]      . clopidogrel (PLAVIX) tablet 75 mg  75 mg Oral Daily [name]      . dextrose 5  %-0.45 % sodium chloride infusion   Intravenous Continuous [name]      . dextrose 50 % solution 25 mL  25 mL Intravenous PRN [name]      . efavirenz-emtrictabine-tenofovir (ATRIPLA) 600-200-300 MG per tablet 1 tablet  1 tablet Oral QHS [name]      . enoxaparin (LOVENOX) injection 40 mg  40 mg Subcutaneous Q24H [name]      . HYDROmorphone (DILAUDID) injection 1 mg  1 mg Intravenous Once Omar Octave, MD   1 mg at 01/04/11 0715  . HYDROmorphone (DILAUDID) injection 1 mg  1 mg Intravenous Q2H PRN [name]      . insulin aspart (novoLOG) injection 10 Units  10 Units Subcutaneous Once Omar Octave, MD   10 Units at 01/04/11 [phone]  . insulin regular (HUMULIN R,NOVOLIN R) 1 Units/mL in sodium chloride 0.9 % 100 mL infusion   Intravenous Continuous Omar Octave, MD 3.7 mL/hr at 01/04/11 0943 3.7 Units/hr at 01/04/11 0943  . insulin regular (HUMULIN R,NOVOLIN R) 1 Units/mL in sodium chloride 0.9 % 100 mL infusion   Intravenous Continuous [name]      . ketorolac (TORADOL) injection 30 mg  30 mg Intravenous  Once Omar Octave, MD   30 mg at 01/04/11 0715  . lisinopril (PRINIVIL,ZESTRIL) tablet 10 mg  10 mg Oral Daily [name]      . metoprolol (TOPROL-XL) 24 hr tablet 100 mg  100 mg Oral Daily [name]      . ondansetron (ZOFRAN) injection 4 mg  4 mg Intravenous Once Omar Octave, MD   4 mg at 01/04/11 0715  . ondansetron (ZOFRAN) injection 4 mg  4 mg Intravenous Q6H PRN [name]      . potassium chloride 10 mEq in 100 mL IVPB  10 mEq Intravenous Q1H [name]      . rosuvastatin (CRESTOR) tablet 40 mg  40 mg Oral Daily [name]      . sodium chloride 0.9 % bolus 1,000 mL  1,000 mL Intravenous Once Omar Octave, MD   1,000 mL at 01/04/11 0718  . sodium chloride 0.9 % bolus 1,000 mL  1,000 mL Intravenous Once Omar Octave, MD   1,000 mL at 01/04/11 0810   Medications Prior to Admission    Medication Sig Dispense Refill  . aspirin 81 MG tablet Take 81 mg by mouth daily.        [name] atorvastatin (LIPITOR) 80 MG tablet Take 1 tablet (80 mg total) by mouth daily.  90 tablet  3  . chlorthalidone (HYGROTON) 25 MG tablet Take 1 tablet (25 mg total) by mouth daily.  90 tablet  3  . clopidogrel (PLAVIX) 75 MG tablet Take 1 tablet (75 mg total) by mouth daily.  90 tablet  3  . efavirenz-emtrictabine-tenofovir (ATRIPLA) 600-200-300 MG per tablet Take 1 tablet by mouth at bedtime.        [name] lisinopril (PRINIVIL,ZESTRIL) 10 MG tablet Take 1 tablet (10 mg total) by mouth daily.  90 tablet  3  . metoprolol (TOPROL-XL) 100 MG 24 hr tablet Take 100 mg by mouth every evening.        . insulin aspart protamine-insulin aspart (NOVOLOG MIX 70/30) (70-30) 100 UNIT/ML injection Inject into the skin. Use as directed      . insulin glargine (LANTUS) 100 UNIT/ML injection            ZOX:WRUEA from the symptoms mentioned above,there are no other symptoms referable to all systems reviewed.  Physical Exam: Blood pressure 142/78, pulse 119, temperature 98.2 F (36.8 C), temperature source Oral, resp. rate 16, height 5\' 6"  (1.676 m), weight 82 kg (180 lb 12.4 oz), SpO2 97.00%. Well developed ,well nourished in mild distress. His PERRLA. His mucus membranes are dry.His neck is supple with no mass or JVD. TM's normal.His chest is clear. His heart is regular with no m/g/r. He is tender in the left flank. His abdominal exam is normal. CNS is grossly intact.  Results for orders placed during the hospital encounter of 01/04/11 (from the past 48 hour(s))  BASIC METABOLIC PANEL     Status: Abnormal   Collection Time   01/04/11  6:59 AM      Component Value Range Comment   Sodium 133 (*) 135 - 145 (mEq/L)    Potassium 3.9  3.5 - 5.1 (mEq/L)    Chloride 94 (*) 96 - 112 (mEq/L)    CO2 16 (*) 19 - 32 (mEq/L)    Glucose, Bld 590 (*) 70 - 99 (mg/dL)    BUN 27 (*) 6 - 23 (mg/dL)    Creatinine, Ser 5.40  0.50 - 1.35  (mg/dL)  Calcium 9.9  8.4 - 10.5 (mg/dL)    GFR calc non Af Amer >60  >60 (mL/min)    GFR calc Af Amer >60  >60 (mL/min)   CBC     Status: Normal   Collection Time   01/04/11  6:59 AM      Component Value Range Comment   WBC 9.2  4.0 - 10.5 (K/uL)    RBC 5.39  4.22 - 5.81 (MIL/uL)    Hemoglobin 16.0  13.0 - 17.0 (g/dL)    HCT 16.1  09.6 - 04.5 (%)    MCV 85.2  78.0 - 100.0 (fL)    MCH 29.7  26.0 - 34.0 (pg)    MCHC 34.9  30.0 - 36.0 (g/dL)    RDW 40.9  81.1 - 91.4 (%)    Platelets 323  150 - 400 (K/uL)   URINALYSIS, ROUTINE W REFLEX MICROSCOPIC     Status: Abnormal   Collection Time   01/04/11  7:10 AM      Component Value Range Comment   Color, Urine YELLOW  YELLOW     Appearance CLEAR  CLEAR     Specific Gravity, Urine 1.010  1.005 - 1.030     pH 5.5  5.0 - 8.0     Glucose, UA >1000 (*) NEGATIVE (mg/dL)    Hgb urine dipstick MODERATE (*) NEGATIVE     Bilirubin Urine NEGATIVE  NEGATIVE     Ketones, ur TRACE (*) NEGATIVE (mg/dL)    Protein, ur NEGATIVE  NEGATIVE (mg/dL)    Urobilinogen, UA 0.2  0.0 - 1.0 (mg/dL)    Nitrite NEGATIVE  NEGATIVE     Leukocytes, UA NEGATIVE  NEGATIVE    URINE MICROSCOPIC-ADD ON     Status: Normal   Collection Time   01/04/11  7:10 AM      Component Value Range Comment   Squamous Epithelial / LPF RARE  RARE     WBC, UA 0-2  <3 (WBC/hpf)    RBC / HPF 3-6  <3 (RBC/hpf)    Bacteria, UA RARE  RARE    BLOOD GAS, VENOUS     Status: Abnormal   Collection Time   01/04/11  8:14 AM      Component Value Range Comment   FIO2 21.00      pH, Ven 7.304 (*) 7.250 - 7.300     pCO2, Ven 41.7 (*) 45.0 - 50.0 (mmHg)    pO2, Ven 69.1 (*) 30.0 - 45.0 (mmHg)    Bicarbonate 20.0  20.0 - 24.0 (mEq/L)    TCO2 17.9  0 - 100 (mmol/L)    Acid-Base Excess 5.2 (*) 0.0 - 2.0 (mmol/L)    O2 Saturation 92.1      Patient temperature 37.0      Collection site HAND      Drawn by        Value: CRITICAL RESULT CALLED TO, READ BACK BY AND VERIFIED WITH:   Sample type VENOUS      LACTIC ACID, PLASMA     Status: Normal   Collection Time   01/04/11  8:18 AM      Component Value Range Comment   Lactic Acid, Venous 1.4  0.5 - 2.2 (mmol/L)   GLUCOSE, CAPILLARY     Status: Abnormal   Collection Time   01/04/11  8:55 AM      Component Value Range Comment   Glucose-Capillary 498 (*) 70 - 99 (mg/dL)   GLUCOSE, CAPILLARY  Status: Abnormal   Collection Time   01/04/11  9:30 AM      Component Value Range Comment   Glucose-Capillary 428 (*) 70 - 99 (mg/dL)    Comment 1 Repeat Test      Comment 2 Documented in Chart      No results found for this or any previous visit (from the past 240 hour(s)).  Ct Abdomen Pelvis Wo Contrast  01/04/2011  *RADIOLOGY REPORT*  Clinical Data: Left flank pain, no hematuria, status post cholecystectomy.  CT ABDOMEN AND PELVIS WITHOUT CONTRAST  Technique:  Multidetector CT imaging of the abdomen and pelvis was performed following the standard protocol without intravenous contrast.  Comparison: CT abdomen dated 05/12/2006, CT abdomen/pelvis dated 10/18/2001  Findings: Lung bases are essentially clear.  Unenhanced liver, spleen, pancreas, adrenal glands are within normal limits.  Status post cholecystectomy.  No intrahepatic or extrahepatic ductal dilatation.  Kidneys are unremarkable.  No hydronephrosis.  No evidence of bowel obstruction.  Normal appendix.  No evidence of abdominal aortic aneurysm.  No abdominopelvic ascites.  No suspicious abdominopelvic lymphadenopathy.  3 mm left ureteral calculus at the UVJ (series 2/image 84).  No bladder calculi.  Visualized osseous structures are within normal limits.  Stable sclerotic lesion in the right femoral head.  IMPRESSION: 3 mm left ureteral calculus at the UVJ.  No hydronephrosis.  Original Report Authenticated By: Charline Bills, M.D.   Impression: Kidney Stone DKA  HIV disese Active Problems:  * No active hospital problems. *      Plan: To have IVF pain and nausea meds and start on DKA  protocol. Continue other home meds      HAWKINS,EDWARD L 01/04/2011, 11:07 AM

## 2011-01-05 LAB — BASIC METABOLIC PANEL
BUN: 21 mg/dL (ref 6–23)
CO2: 22 mEq/L (ref 19–32)
Calcium: 8.7 mg/dL (ref 8.4–10.5)
Chloride: 105 mEq/L (ref 96–112)
Creatinine, Ser: 0.81 mg/dL (ref 0.50–1.35)
GFR calc Af Amer: >60 mL/min (ref >60)
GFR calc non Af Amer: >60 mL/min (ref >60)
Glucose, Bld: 206 mg/dL — ABNORMAL HIGH (ref 70–99)
Potassium: 4.1 mEq/L (ref 3.5–5.1)
Sodium: 138 mEq/L (ref 135–145)

## 2011-01-05 LAB — DIFFERENTIAL
Basophils Absolute: 0 10*3/uL (ref 0.0–0.1)
Basophils Relative: 0 % (ref 0–1)
Eosinophils Absolute: 0.3 10*3/uL (ref 0.0–0.7)
Eosinophils Relative: 4 % (ref 0–5)
Lymphocytes Relative: 47 % — ABNORMAL HIGH (ref 12–46)
Lymphs Abs: 3.5 10*3/uL (ref 0.7–4.0)
Monocytes Absolute: 0.5 10*3/uL (ref 0.1–1.0)
Monocytes Relative: 7 % (ref 3–12)
Neutro Abs: 3.1 10*3/uL (ref 1.7–7.7)
Neutrophils Relative %: 42 % — ABNORMAL LOW (ref 43–77)

## 2011-01-05 LAB — GLUCOSE, CAPILLARY: Glucose-Capillary: 178 mg/dL — ABNORMAL HIGH (ref 70–99)

## 2011-01-05 LAB — CBC
HCT: 39.8 % (ref 39.0–52.0)
Hemoglobin: 13.5 g/dL (ref 13.0–17.0)
MCH: 29.7 pg (ref 26.0–34.0)
MCHC: 33.9 g/dL (ref 30.0–36.0)
MCV: 87.7 fL (ref 78.0–100.0)
Platelets: 250 10*3/uL (ref 150–400)
RBC: 4.54 MIL/uL (ref 4.22–5.81)
RDW: 12.3 % (ref 11.5–15.5)
WBC: 7.5 10*3/uL (ref 4.0–10.5)

## 2011-01-05 MED ORDER — METOPROLOL SUCCINATE ER 50 MG PO TB24: 100.0000 mg | ORAL_TABLET | Freq: Every evening | ORAL | Status: DC

## 2011-01-05 MED ORDER — ROSUVASTATIN CALCIUM 40 MG PO TABS: 40.0000 mg | ORAL_TABLET | Freq: Every day | ORAL | Status: DC

## 2011-01-05 MED ORDER — INSULIN ASPART 100 UNIT/ML ~~LOC~~ SOLN: 8.0000 [IU] | Freq: Three times a day (TID) | SUBCUTANEOUS | Status: DC

## 2011-01-05 MED ORDER — INSULIN GLARGINE 100 UNIT/ML ~~LOC~~ SOLN: 70.0000 [IU] | Freq: Every day | SUBCUTANEOUS | Status: DC

## 2011-01-05 MED ORDER — METOPROLOL SUCCINATE ER 100 MG PO TB24: 100.0000 mg | ORAL_TABLET | Freq: Every day | ORAL | Status: DC

## 2011-01-05 NOTE — Progress Notes (Signed)
RETRO UR Chart Review Completed 

## 2011-01-05 NOTE — Progress Notes (Signed)
Pt alert and oriented. cbg 178. Skin warm and dry. D/c'd home via wc after iv d/c'd 

## 2011-01-05 NOTE — Progress Notes (Signed)
Pt alert and oriented. cbg 178. Skin warm and dry. D/c'd home via wc after iv d/c'd

## 2011-01-05 NOTE — Discharge Summary (Signed)
NAMETILER, BRANDIS                 ACCOUNT NO.:  [id number]  MEDICAL RECORD NO.:  [id number]  LOCATION:  IC11                          FACILITY:  [facility]  PHYSICIAN:  Kingsley Callander. Ouida Sills, MD       DATE OF BIRTH:  08-18-1965  DATE OF ADMISSION:  01/04/2011 DATE OF DISCHARGE:  LH                              DISCHARGE SUMMARY   DISCHARGE DIAGNOSES: 1. Left renal colic. 2. Diabetic ketoacidosis. 3. Coronary artery disease. 4. Human immunodeficiency virus. 5. Hyperlipidemia.  MEDICATIONS: 1. Atripla daily. 2. Aspirin 81 mg daily. 3. Plavix 75 mg daily. 4. Lisinopril 10 mg daily. 5. Toprol XL 100 mg daily. 6. Crestor 40 mg daily. 7. Lantus 70 units daily. 8. NovoLog 8 units base dose with corrective dose based on his     glucose. 9. Chlorthalidone 25 mg daily with  HOSPITAL COURSE:  This patient is a 45 year old male, who presented with severe left flank pain and vomiting.  He had blood in his urine and was found to have 3 mm kidney stone in his left ureterovesical junction.  He was also found to be in DKA.  His bicarb was 16 on admission with serum sodium of 133, potassium 3.9, BUN is 27 and creatinine 0.98.  He was treated with IV fluids and insulin drip.  He received Dilaudid for his pain with his stone.  He passed the stone on the evening of admission and was symptom free on the morning of the 13th.  His glucose had improved into the 170 range.  His insulin drip had been discontinued last night.  His vomiting has stopped.  He has been able to eat.  He is feeling much better.  His ABG on admission had revealed a pH of 7.30 with a pCO2 of 41 and pO2 of 69.  His MET-7 on the morning of discharge revealed a normalization of his bicarb of 22, potassium of 4.1, BUN and creatinine of 21 and 0.81.  His white count is 7.5 with a hemoglobin of 13.5.  He was improved and stable for discharge.  He will be seen in follow-up in my office in 1 week.  He will resume his current insulin regimen  and his usual medications.  He has been stable from a cardiac standpoint.     Kingsley Callander. Ouida Sills, MD     ROF/MEDQ  D:  01/05/2011  T:  01/05/2011  Job:  [id number]

## 2011-02-19 LAB — PROTIME-INR
INR: 1
Prothrombin Time: 13

## 2011-02-19 LAB — COMPREHENSIVE METABOLIC PANEL
ALT: 30
AST: 26
Albumin: 3.8
Alkaline Phosphatase: 76
BUN: 15
CO2: 29
Calcium: 9.1
Chloride: 102
Creatinine, Ser: 0.83
GFR calc Af Amer: >60
GFR calc non Af Amer: >60
Glucose, Bld: 130 — ABNORMAL HIGH
Potassium: 4.1
Sodium: 139
Total Bilirubin: 0.5
Total Protein: 6.8

## 2011-02-19 LAB — POCT CARDIAC MARKERS
CKMB, poc: <1 — ABNORMAL LOW
Myoglobin, poc: 52.3
Operator id: 275321
Troponin i, poc: <0.05

## 2011-02-19 LAB — DIFFERENTIAL
Basophils Absolute: 0
Basophils Absolute: 0
Basophils Relative: 0
Basophils Relative: 1
Eosinophils Absolute: 0
Eosinophils Absolute: 0.2
Eosinophils Relative: 1
Eosinophils Relative: 3
Lymphocytes Relative: 24
Lymphocytes Relative: 44
Lymphs Abs: 1.2
Lymphs Abs: 2.5
Monocytes Absolute: 0.6
Monocytes Absolute: 0.6
Monocytes Relative: 11
Monocytes Relative: 13 — ABNORMAL HIGH
Neutro Abs: 2.4
Neutro Abs: 3.1
Neutrophils Relative %: 43
Neutrophils Relative %: 62

## 2011-02-19 LAB — TROPONIN I: Troponin I: <0.01

## 2011-02-19 LAB — CBC
HCT: 38.5 — ABNORMAL LOW
HCT: 44.4
Hemoglobin: 13.6
Hemoglobin: 15.5
MCHC: 35
MCHC: 35.5
MCV: 87.7
MCV: 88.3
Platelets: 276
Platelets: 341
RBC: 4.35
RBC: 5.06
RDW: 12.9
RDW: 13.1
WBC: 5
WBC: 5.7

## 2011-02-19 LAB — BASIC METABOLIC PANEL
BUN: 7
CO2: 26
Calcium: 8.5
Chloride: 105
Creatinine, Ser: 0.89
GFR calc Af Amer: >60
GFR calc non Af Amer: >60
Glucose, Bld: 124 — ABNORMAL HIGH
Potassium: 3.5
Sodium: 139

## 2011-02-19 LAB — CK TOTAL AND CKMB (NOT AT ARMC)
CK, MB: 1
Relative Index: 1
Total CK: 104

## 2011-02-19 LAB — APTT: aPTT: 25

## 2011-02-19 LAB — TSH: TSH: 1.581

## 2011-02-19 LAB — D-DIMER, QUANTITATIVE: D-Dimer, Quant: <0.22

## 2011-04-27 ENCOUNTER — Ambulatory Visit: Payer: 59

## 2011-04-27 ENCOUNTER — Encounter: Payer: 59 | Attending: Internal Medicine | Admitting: Dietician

## 2011-04-27 NOTE — Patient Instructions (Addendum)
Patient will attend Core Diabetes Courses II and III as scheduled or follow up prn.  

## 2011-04-27 NOTE — Progress Notes (Signed)
  Patient was seen on 04/26/2012 for the first of a series of three diabetes self-management courses at the [facility]. The following learning objectives were met by the patient during this course:   Defines the role of glucose and insulin  Identifies type of diabetes and pathophysiology  Defines the diagnostic criteria for diabetes and prediabetes  States the risk factors for Type 2 Diabetes  States the symptoms of Type 2 Diabetes  Defines Type 2 Diabetes treatment goals  Defines Type 2 Diabetes treatment options  States the rationale for glucose monitoring  Identifies A1C, glucose targets, and testing times  Identifies proper sharps disposal  Defines the purpose of a diabetes food plan  Identifies carbohydrate food groups  Defines effects of carbohydrate foods on glucose levels  Identifies carbohydrate choices/grams/food labels  States benefits of physical activity and effect on glucose  Review of suggested activity guidelines  Handouts given during class include:  Type 2 Diabetes: Basics Book  My Food Plan Book  Food and Activity Log  Lab Results  Component Value Date   HGBA1C 8.5 03/30/2009    Patient has established the following initial goals:  Walk out of the office 2/3 times per day to decrease stress levels    Follow-Up Plan:       Attend the Diabetes Self-Management Core Classes

## 2011-05-01 ENCOUNTER — Encounter: Payer: Self-pay | Admitting: Dietician

## 2011-05-01 NOTE — Progress Notes (Deleted)
  Patient was seen on 04/27/2011 for the first of a series of three diabetes self-management courses at the [facility]. The following learning objectives were met by the patient during this course:   Defines the role of glucose and insulin  Identifies type of diabetes and pathophysiology  Defines the diagnostic criteria for diabetes and prediabetes  States the risk factors for Type 2 Diabetes  States the symptoms of Type 2 Diabetes  Defines Type 2 Diabetes treatment goals  Defines Type 2 Diabetes treatment options  States the rationale for glucose monitoring  Identifies A1C, glucose targets, and testing times  Identifies proper sharps disposal  Defines the purpose of a diabetes food plan  Identifies carbohydrate food groups  Defines effects of carbohydrate foods on glucose levels  Identifies carbohydrate choices/grams/food labels  States benefits of physical activity and effect on glucose  Review of suggested activity guidelines  Handouts given during class include:  Type 2 Diabetes: Basics Book  My Food Plan Book  Food and Activity Log  Patient has established the following initial goals:  Walk at work for a short time, for at least 2-3 times per day to help manage my stress. Follow-Up Plan: Attend the remaining Diabetes Management Core Classes.

## 2011-05-07 ENCOUNTER — Encounter: Payer: Self-pay | Admitting: *Deleted

## 2011-05-07 ENCOUNTER — Encounter: Payer: 59 | Admitting: *Deleted

## 2011-05-07 DIAGNOSIS — E119 Type 2 diabetes mellitus without complications: Secondary | ICD-10-CM

## 2011-05-07 DIAGNOSIS — E1110 Type 2 diabetes mellitus with ketoacidosis without coma: Secondary | ICD-10-CM

## 2011-05-07 NOTE — Progress Notes (Signed)
  Patient was seen on 05/07/2011 for the second of a series of three diabetes self-management courses at the [facility]. The following learning objectives were met by the patient during this course:   States the relationship of exercise to blood glucose  States benefits/barriers of regular and safe exercise  States three guidelines for safe and effective exercise  Describes personal diabetes medicine regimen  Describes actions of own medications  Describes causes, symptoms, and treatment of hypo/hyperglycemia  Describes sick day rules  Identifies when to test urine for ketones when appropriate  Identifies when to call healthcare provider for acute complications  States the risk for problems with foot, skin, and dental care  States preventative foot, skin, and dental care measures  States when to call healthcare provider regarding foot, skin, and dental care  Identifies methods for evaluation of diabetes control  Discusses benefits of SBGM  Identifies relationship between nutrition, exercise, medication, and glucose levels  Discusses the importance of record keeping  *Patient received [facility] Core Program Notebook at class.  Follow-Up Plan: Patient will attend the final class of the ADA Diabetes Self-Care Education.   

## 2011-05-14 ENCOUNTER — Encounter: Payer: 59 | Admitting: *Deleted

## 2011-05-14 DIAGNOSIS — E119 Type 2 diabetes mellitus without complications: Secondary | ICD-10-CM

## 2011-05-14 NOTE — Progress Notes (Signed)
  Patient was seen on 05/14/2011 for the third of a series of three diabetes self-management courses at the [facility]. The following learning objectives were met by the patient during this course:   Identifies nutrient effects on glycemia  States the general guidelines of meal planning  Relates understanding of personal meal plan  Describes situations that cause stress and discuss methods of stress management  Identifies lifestyle behaviors for change  The following handouts were given in class:  3 Month Follow Up Visit handout  Goal setting handout  Class evaluation form  Your patient has established the following 3 month goals for diabetes self-care:  Decrease fat intake  Always carry quick acting form of carbohydrate to treat hypoglycemia  Follow-Up Plan: Patient will attend a 3 month follow-up visit for diabetes self-management education.

## 2011-06-16 ENCOUNTER — Encounter: Payer: Self-pay | Admitting: *Deleted

## 2011-07-03 ENCOUNTER — Encounter: Payer: Self-pay | Admitting: Cardiology

## 2011-07-06 ENCOUNTER — Encounter: Payer: Self-pay | Admitting: Cardiology

## 2011-07-06 ENCOUNTER — Ambulatory Visit (INDEPENDENT_AMBULATORY_CARE_PROVIDER_SITE_OTHER): Payer: 59 | Admitting: Cardiology

## 2011-07-06 DIAGNOSIS — R000 Tachycardia, unspecified: Secondary | ICD-10-CM

## 2011-07-06 DIAGNOSIS — I2510 Atherosclerotic heart disease of native coronary artery without angina pectoris: Secondary | ICD-10-CM

## 2011-07-06 DIAGNOSIS — I10 Essential (primary) hypertension: Secondary | ICD-10-CM

## 2011-07-06 DIAGNOSIS — I498 Other specified cardiac arrhythmias: Secondary | ICD-10-CM

## 2011-07-06 NOTE — Assessment & Plan Note (Signed)
Coronary disease is stable. He is not having any significant symptoms. We do not need an exercise test at this time.

## 2011-07-06 NOTE — Assessment & Plan Note (Signed)
Blood pressure is controlled. No change in therapy. 

## 2011-07-06 NOTE — Patient Instructions (Signed)

## 2011-07-06 NOTE — Progress Notes (Signed)
HPI Patient is seen to followupCoronary artery disease. He is doing well. He had a drug-eluting stent in the past. In 2009 he had some in stent restenosis that was treated. He's had no significant problems since then. EF was 60% in 2009.  As part of today's evaluation I have reviewed my old cardiology records. I have carefully updated the new electronic medical record. No Known Allergies  Current Outpatient Prescriptions  Medication Sig Dispense Refill  . aspirin 81 MG tablet Take 81 mg by mouth daily.        . chlorthalidone (HYGROTON) 25 MG tablet Take 1 tablet (25 mg total) by mouth daily.  90 tablet  3  . clopidogrel (PLAVIX) 75 MG tablet Take 1 tablet (75 mg total) by mouth daily.  90 tablet  3  . efavirenz-emtrictabine-tenofovir (ATRIPLA) 600-200-300 MG per tablet Take 1 tablet by mouth at bedtime.        . insulin aspart (NOVOLOG FLEXPEN) 100 UNIT/ML injection Inject 8 Units into the skin 3 (three) times daily before meals. Base dose is 8 units, adjust with 1 unit increments based on  Blood Glucose levels      . insulin glargine (LANTUS SOLOSTAR) 100 UNIT/ML injection Inject 70 Units into the skin at bedtime. Currently states he is taking 60 units daily.      [name] lisinopril (PRINIVIL,ZESTRIL) 10 MG tablet Take 1 tablet (10 mg total) by mouth daily.  90 tablet  3  . metoprolol (TOPROL-XL) 100 MG 24 hr tablet Take 1 tablet (100 mg total) by mouth daily.  90 tablet  3  . rosuvastatin (CRESTOR) 40 MG tablet Take 1 tablet (40 mg total) by mouth daily.  30 tablet  12    History   Social History  . Marital Status: Single    Spouse Name: N/A    Number of Children: N/A  . Years of Education: N/A   Occupational History  . Not on file.   Social History Main Topics  . Smoking status: Never Smoker   . Smokeless tobacco: Not on file  . Alcohol Use: 0.0 oz/week    0-1 drink(s) per week  . Drug Use: No  . Sexually Active: Not on file   Other Topics Concern  . Not on file   Social  History Narrative  . No narrative on file    Family History  Problem Relation Age of Onset  . Anemia    . Diabetes      Past Medical History  Diagnosis Date  . Diabetes mellitus type II   . HTN (hypertension)   . HIV (human immunodeficiency virus infection)   . 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  . 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 CAD (coronary artery disease)     DES.  /  ... Later (10/2007) intervention for in-stent restenosis ;  EF 60%... catheterization... June, 2009  . Sinus tachycardia     mild at rest.... October, 2010  . Kidney calculi   . Myocardial infarction   . Ejection fraction     EF 60%, catheterization, 2009    Past Surgical History  Procedure Date  . Cholecystectomy   . Coronary stent placement     ROS  Patient denies fever, chills, headache, sweats, rash, change in vision, change in hearing, chest pain, cough, nausea vomiting, urinary symptoms. All other systems are reviewed and  are negative.  PHYSICAL EXAM Patient is oriented to person time and place. He is overweight. Lungs are clear. Respiratory effort is nonlabored. Cardiac exam reveals S1 and S2. There no clicks or significant murmurs. There is no jugular venous distention. There are no carotid bruits. The abdomen is soft. He is no peripheral edema. There no musculoskeletal deformities. There are no skin rashes. Filed Vitals:   07/06/11 1450  BP: 128/80  Pulse: 106  Height: 5\' 6"  (1.676 m)  Weight: 180 lb (81.647 kg)   EKG is done today and reviewed by me. There is normal sinus rhythm. There no significant changes. There are nonspecific ST-T wave changes.  ASSESSMENT & PLAN

## 2011-07-06 NOTE — Assessment & Plan Note (Signed)
Patient still has mild sinus tachycardia at rest. He is on a beta blocker. We have not checked his LV function since 2009. I believe that we should proceed with an echo at this time.

## 2011-07-10 ENCOUNTER — Other Ambulatory Visit: Payer: Self-pay

## 2011-07-10 ENCOUNTER — Ambulatory Visit (HOSPITAL_COMMUNITY): Payer: 59 | Attending: Cardiovascular Disease

## 2011-07-10 DIAGNOSIS — I2510 Atherosclerotic heart disease of native coronary artery without angina pectoris: Secondary | ICD-10-CM

## 2011-07-10 DIAGNOSIS — I10 Essential (primary) hypertension: Secondary | ICD-10-CM | POA: Insufficient documentation

## 2011-07-10 DIAGNOSIS — E119 Type 2 diabetes mellitus without complications: Secondary | ICD-10-CM | POA: Insufficient documentation

## 2011-07-10 DIAGNOSIS — I252 Old myocardial infarction: Secondary | ICD-10-CM | POA: Insufficient documentation

## 2011-07-27 ENCOUNTER — Ambulatory Visit: Payer: 59 | Admitting: *Deleted

## 2011-09-11 DIAGNOSIS — B20 Human immunodeficiency virus [HIV] disease: Secondary | ICD-10-CM | POA: Insufficient documentation

## 2011-12-30 ENCOUNTER — Other Ambulatory Visit: Payer: Self-pay | Admitting: *Deleted

## 2011-12-30 MED ORDER — CLOPIDOGREL BISULFATE 75 MG PO TABS: 75.0000 mg | ORAL_TABLET | Freq: Every day | ORAL | Status: DC

## 2011-12-30 MED ORDER — LISINOPRIL 10 MG PO TABS: 10.0000 mg | ORAL_TABLET | Freq: Every day | ORAL | Status: DC

## 2011-12-30 MED ORDER — CHLORTHALIDONE 25 MG PO TABS: 25.0000 mg | ORAL_TABLET | Freq: Every day | ORAL | Status: DC

## 2012-01-04 ENCOUNTER — Other Ambulatory Visit: Payer: Self-pay | Admitting: *Deleted

## 2012-01-04 MED ORDER — ROSUVASTATIN CALCIUM 40 MG PO TABS: 40.0000 mg | ORAL_TABLET | Freq: Every day | ORAL | Status: DC

## 2012-01-21 ENCOUNTER — Emergency Department (HOSPITAL_COMMUNITY): Payer: 59

## 2012-01-21 ENCOUNTER — Encounter (HOSPITAL_COMMUNITY): Payer: Self-pay | Admitting: *Deleted

## 2012-01-21 ENCOUNTER — Other Ambulatory Visit: Payer: Self-pay

## 2012-01-21 ENCOUNTER — Emergency Department (HOSPITAL_COMMUNITY)
Admission: EM | Admit: 2012-01-21 | Discharge: 2012-01-21 | Disposition: A | Discharge status: Home / Self Care | Payer: 59 | Attending: Emergency Medicine | Admitting: Emergency Medicine

## 2012-01-21 DIAGNOSIS — R0789 Other chest pain: Secondary | ICD-10-CM | POA: Insufficient documentation

## 2012-01-21 DIAGNOSIS — Z9861 Coronary angioplasty status: Secondary | ICD-10-CM | POA: Insufficient documentation

## 2012-01-21 DIAGNOSIS — I2510 Atherosclerotic heart disease of native coronary artery without angina pectoris: Secondary | ICD-10-CM | POA: Insufficient documentation

## 2012-01-21 DIAGNOSIS — Z794 Long term (current) use of insulin: Secondary | ICD-10-CM | POA: Insufficient documentation

## 2012-01-21 DIAGNOSIS — Z79899 Other long term (current) drug therapy: Secondary | ICD-10-CM | POA: Insufficient documentation

## 2012-01-21 DIAGNOSIS — I252 Old myocardial infarction: Secondary | ICD-10-CM | POA: Insufficient documentation

## 2012-01-21 DIAGNOSIS — E119 Type 2 diabetes mellitus without complications: Secondary | ICD-10-CM | POA: Insufficient documentation

## 2012-01-21 DIAGNOSIS — Z21 Asymptomatic human immunodeficiency virus [HIV] infection status: Secondary | ICD-10-CM | POA: Insufficient documentation

## 2012-01-21 DIAGNOSIS — I10 Essential (primary) hypertension: Secondary | ICD-10-CM | POA: Insufficient documentation

## 2012-01-21 LAB — BASIC METABOLIC PANEL
BUN: 17 mg/dL (ref 6–23)
CO2: 24 mEq/L (ref 19–32)
Calcium: 8.9 mg/dL (ref 8.4–10.5)
Chloride: 100 mEq/L (ref 96–112)
Creatinine, Ser: 0.66 mg/dL (ref 0.50–1.35)
GFR calc Af Amer: >90 mL/min (ref >90)
GFR calc non Af Amer: >90 mL/min (ref >90)
Glucose, Bld: 167 mg/dL — ABNORMAL HIGH (ref 70–99)
Potassium: 3 mEq/L — ABNORMAL LOW (ref 3.5–5.1)
Sodium: 138 mEq/L (ref 135–145)

## 2012-01-21 LAB — POCT I-STAT TROPONIN I: Troponin i, poc: 0 ng/mL (ref 0.00–0.08)

## 2012-01-21 LAB — TROPONIN I: Troponin I: <0.3 ng/mL (ref <0.30)

## 2012-01-21 LAB — CBC
HCT: 44.9 % (ref 39.0–52.0)
Hemoglobin: 15.5 g/dL (ref 13.0–17.0)
MCH: 29 pg (ref 26.0–34.0)
MCHC: 34.5 g/dL (ref 30.0–36.0)
MCV: 84.1 fL (ref 78.0–100.0)
Platelets: 274 10*3/uL (ref 150–400)
RBC: 5.34 MIL/uL (ref 4.22–5.81)
RDW: 12.5 % (ref 11.5–15.5)
WBC: 8 10*3/uL (ref 4.0–10.5)

## 2012-01-21 MED ORDER — NITROGLYCERIN 0.4 MG SL SUBL
0.4000 mg | SUBLINGUAL_TABLET | SUBLINGUAL | Status: AC | PRN
  Administered 2012-01-21 (×3): 0.4 mg via SUBLINGUAL
  Filled 2012-01-21: qty 25

## 2012-01-21 MED ORDER — ASPIRIN 81 MG PO CHEW
324.0000 mg | CHEWABLE_TABLET | Freq: Once | ORAL | Status: AC
  Administered 2012-01-21: 324 mg via ORAL
  Filled 2012-01-21: qty 4

## 2012-01-21 NOTE — ED Provider Notes (Addendum)
History     CSN: [id number]  Arrival date & time 01/21/12  0331   First MD Initiated Contact with Patient 01/21/12 [phone]      Chief Complaint  Patient presents with  . Chest Pain  . Shortness of Breath    (Consider location/radiation/quality/duration/timing/severity/associated sxs/prior treatment) HPI   Zadyn P Fendley is a 46 y.o. male with a h/o DM, HTN, CAD  who presents to the Emergency Department complaining of heaviness in his chest since yesterday. He has taken his aspirin. He denies shortness of breath, diaphoresis. Pain does not radiate. Nothing makes it worse or better.  PCP Dr. Ouida Sills    Past Medical History  Diagnosis Date  . Diabetes mellitus type II   . HTN (hypertension)   . HIV (human immunodeficiency virus infection)   . 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  . 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 CAD (coronary artery disease)     DES.  /  ... Later (10/2007) intervention for in-stent restenosis ;  EF 60%... catheterization... June, 2009  . Sinus tachycardia     mild at rest.... October, 2010  . Kidney calculi   . Myocardial infarction   . Ejection fraction     EF 60%, catheterization, 2009    Past Surgical History  Procedure Date  . Cholecystectomy   . Coronary stent placement     Family History  Problem Relation Age of Onset  . Anemia    . Diabetes      History  Substance Use Topics  . Smoking status: Never Smoker   . Smokeless tobacco: Not on file  . Alcohol Use: 0.0 oz/week    0-1 drink(s) per week      Review of Systems  Constitutional: Negative for fever.       10 Systems reviewed and are negative for acute change except as noted in the HPI.  HENT: Negative for congestion.   Eyes: Negative for discharge and redness.  Respiratory: Positive for chest tightness. Negative for cough and  shortness of breath.   Cardiovascular: Negative for chest pain.  Gastrointestinal: Negative for vomiting and abdominal pain.  Musculoskeletal: Negative for back pain.  Skin: Negative for rash.  Neurological: Negative for syncope, numbness and headaches.  Psychiatric/Behavioral:       No behavior change.    Allergies  Review of patient's allergies indicates no known allergies.  Home Medications   Current Outpatient Rx  Name Route Sig Dispense Refill  . ASPIRIN 81 MG PO TABS Oral Take 81 mg by mouth daily.      . CHLORTHALIDONE 25 MG PO TABS Oral Take 1 tablet (25 mg total) by mouth daily. 90 tablet 2  . CLOPIDOGREL BISULFATE 75 MG PO TABS Oral Take 1 tablet (75 mg total) by mouth daily. 90 tablet 2  . EFAVIRENZ-EMTRICITAB-TENOFOVIR 600-200-300 MG PO TABS Oral Take 1 tablet by mouth at bedtime.      . INSULIN ASPART 100 UNIT/ML  SOLN Subcutaneous Inject 8 Units into the skin 3 (three) times daily before meals. Base dose is 8 units, adjust with 1 unit increments based on  Blood Glucose levels    . INSULIN GLARGINE 100 UNIT/ML  SOLN Subcutaneous Inject 70 Units into the skin at bedtime. Currently states he is taking 60 units daily.    [name] LISINOPRIL 10 MG PO TABS Oral Take 1 tablet (  10 mg total) by mouth daily. 90 tablet 2  . METOPROLOL SUCCINATE ER 100 MG PO TB24 Oral Take 1 tablet (100 mg total) by mouth daily. 90 tablet 3  . ROSUVASTATIN CALCIUM 40 MG PO TABS Oral Take 1 tablet (40 mg total) by mouth daily. 90 tablet 1    BP 174/98  Pulse 105  Temp 98.2 F (36.8 C) (Oral)  Resp 22  Ht 5\' 6"  (1.676 m)  Wt 180 lb (81.647 kg)  BMI 29.05 kg/m2  SpO2 97%  Physical Exam  Nursing note and vitals reviewed. Constitutional: He is oriented to person, place, and time. He appears well-developed and well-nourished.       Awake, alert, nontoxic appearance.  HENT:  Head: Atraumatic.  Eyes: Right eye exhibits no discharge. Left eye exhibits no discharge.  Neck: Neck supple.    Cardiovascular: Normal heart sounds and intact distal pulses.   No murmur heard. Pulmonary/Chest: Effort normal and breath sounds normal. He has no wheezes. He exhibits no tenderness.  Abdominal: Soft. There is no tenderness. There is no rebound.  Musculoskeletal: Normal range of motion. He exhibits no tenderness.       Baseline ROM, no obvious new focal weakness.  Neurological: He is alert and oriented to person, place, and time.       Mental status and motor strength appears baseline for patient and situation.  Skin: No rash noted.  Psychiatric: He has a normal mood and affect.    ED Course  Procedures (including critical care time) Results for orders placed during the hospital encounter of 01/21/12  CBC      Component Value Range   WBC 8.0  4.0 - 10.5 K/uL   RBC 5.34  4.22 - 5.81 MIL/uL   Hemoglobin 15.5  13.0 - 17.0 g/dL   HCT 46.9  62.9 - 52.8 %   MCV 84.1  78.0 - 100.0 fL   MCH 29.0  26.0 - 34.0 pg   MCHC 34.5  30.0 - 36.0 g/dL   RDW 41.3  24.4 - 01.0 %   Platelets 274  150 - 400 K/uL  POCT I-STAT TROPONIN I      Component Value Range   Troponin i, poc 0.00  0.00 - 0.08 ng/mL   Comment 3           Dg Chest 1 View  01/21/2012  *RADIOLOGY REPORT*  Clinical Data: Chest pain  CHEST - 1 VIEW  Comparison: 11/08/2007  Findings: Heart size mildly enlarged. Retrocardiac opacity not excluded.  The lungs are otherwise clear.  No pleural effusion or pneumothorax.  No acute osseous finding.  IMPRESSION: Heart size mildly enlarged.  Retrocardiac space obscured.  Otherwise, no acute process identified.   Consider follow-up with PA and lateral views.   Original Report Authenticated By: Waneta Martins, M.D.      Date: 01/21/2012  2725  Rate:102  Rhythm: sinus tachycardial  QRS Axis: normal  Intervals: normal  ST/T Wave abnormalities: normal  Conduction Disutrbances: none  Narrative Interpretation: no change from 11/09/07    No diagnosis found.  0500 With ntg x 2 may have some  relief from pressure.   MDM  Patient with chest pressure since yesterday.Troponin negative, EKG normal, chest xray normal, labs unremarkable. Some relief with ntg x 2. Patient ambulated in the hallway with no increase in chest pressure, no shortness of breath, O2 sats remained 99%. Dx testing d/w pt . Questions answered.  Verb understanding, agreeable to d/c home with  outpt f/u.Pt stable in ED with no significant deterioration in condition.The patient appears reasonably screened and/or stabilized for discharge and I doubt any other medical condition or other [facility] requiring further screening, evaluation, or treatment in the ED at this time prior to discharge.  MDM Reviewed: nursing note and vitals Reviewed previous: labs, ECG and x-ray Interpretation: labs, ECG and x-ray            Aurther Loft S. Colon Branch, MD 01/21/12 1610  Nicoletta Dress. Colon Branch, MD 02/18/12 [phone]

## 2012-01-21 NOTE — ED Notes (Signed)
Pt reports not feeling right, has a heaviness & tightness to his chest. Pt had a cath about 3 years ago, has stents in place. Pt took his normal 81 mg aspirin

## 2012-07-09 ENCOUNTER — Other Ambulatory Visit: Payer: Self-pay

## 2012-08-25 ENCOUNTER — Other Ambulatory Visit: Payer: Self-pay

## 2012-08-25 MED ORDER — ROSUVASTATIN CALCIUM 40 MG PO TABS: 40.0000 mg | ORAL_TABLET | Freq: Every day | ORAL | Status: DC

## 2012-11-16 ENCOUNTER — Telehealth: Payer: Self-pay

## 2012-11-16 NOTE — Telephone Encounter (Signed)
Referral received from Dr Ouida Sills for New 042 intake.  Message left on patient's voice mail to call back for an appointment.    Laurell Josephs, RN

## 2012-11-24 ENCOUNTER — Telehealth: Payer: Self-pay

## 2012-11-24 NOTE — Telephone Encounter (Signed)
Pt calling to schedule new patient appointment.   Medical records received.  He will only need labs and office visit with physician.   Laurell Josephs, RN

## 2012-11-24 NOTE — Telephone Encounter (Signed)
error 

## 2012-11-28 ENCOUNTER — Other Ambulatory Visit: Payer: Self-pay | Admitting: Cardiology

## 2012-11-30 ENCOUNTER — Other Ambulatory Visit: Payer: Self-pay | Admitting: Cardiology

## 2012-12-21 ENCOUNTER — Encounter: Payer: Self-pay | Admitting: Cardiology

## 2012-12-21 ENCOUNTER — Ambulatory Visit (INDEPENDENT_AMBULATORY_CARE_PROVIDER_SITE_OTHER): Payer: 59 | Admitting: Cardiology

## 2012-12-21 VITALS — BP 118/72 | HR 84 | Ht 66.0 in | Wt 199.0 lb

## 2012-12-21 DIAGNOSIS — I10 Essential (primary) hypertension: Secondary | ICD-10-CM | POA: Insufficient documentation

## 2012-12-21 DIAGNOSIS — IMO0002 Reserved for concepts with insufficient information to code with codable children: Secondary | ICD-10-CM | POA: Insufficient documentation

## 2012-12-21 DIAGNOSIS — E785 Hyperlipidemia, unspecified: Secondary | ICD-10-CM

## 2012-12-21 DIAGNOSIS — I498 Other specified cardiac arrhythmias: Secondary | ICD-10-CM

## 2012-12-21 DIAGNOSIS — I2510 Atherosclerotic heart disease of native coronary artery without angina pectoris: Secondary | ICD-10-CM

## 2012-12-21 DIAGNOSIS — R9430 Abnormal result of cardiovascular function study, unspecified: Secondary | ICD-10-CM | POA: Insufficient documentation

## 2012-12-21 DIAGNOSIS — R000 Tachycardia, unspecified: Secondary | ICD-10-CM

## 2012-12-21 NOTE — Progress Notes (Signed)
HPI  Patient is seen today to followup coronary disease. He is doing well. He's not having any significant chest pain. He is going about full activities. He is on all appropriate medications. He is on aspirin Plavix. I reviewed all of his data including the procedure that was done in 2009. At that time he received a drug-eluting stent for in-stent restenosis of another stent. He has been on aspirin and Plavix since then.  No Known Allergies  Current Outpatient Prescriptions  Medication Sig Dispense Refill  . aspirin 81 MG tablet Take 81 mg by mouth daily.        . chlorthalidone (HYGROTON) 25 MG tablet TAKE 1 TABLET BY MOUTH DAILY.  30 tablet  0  . clopidogrel (PLAVIX) 75 MG tablet TAKE 1 TABLET BY MOUTH DAILY.  30 tablet  0  . efavirenz-emtrictabine-tenofovir (ATRIPLA) 600-200-300 MG per tablet Take 1 tablet by mouth at bedtime.        . insulin aspart (NOVOLOG FLEXPEN) 100 UNIT/ML injection Inject 8 Units into the skin 3 (three) times daily before meals. Base dose is 8 units, adjust with 1 unit increments based on  Blood Glucose levels      . insulin glargine (LANTUS SOLOSTAR) 100 UNIT/ML injection Inject 70 Units into the skin at bedtime. Currently states he is taking 60 units daily.      [name] lisinopril (PRINIVIL,ZESTRIL) 10 MG tablet TAKE 1 TABLET BY MOUTH DAILY.  30 tablet  0  . metoprolol (TOPROL-XL) 100 MG 24 hr tablet Take 1 tablet (100 mg total) by mouth daily.  90 tablet  3  . rosuvastatin (CRESTOR) 40 MG tablet Take 1 tablet (40 mg total) by mouth daily.  90 tablet  1   No current facility-administered medications for this visit.    History   Social History  . Marital Status: Single    Spouse Name: N/A    Number of Children: N/A  . Years of Education: N/A   Occupational History  . Not on file.   Social History Main Topics  . Smoking status: Never Smoker   . Smokeless tobacco: Not on file  . Alcohol Use: 0.0 oz/week    0-1 drink(s) per week  . Drug Use: No  . Sexually  Active: Not on file   Other Topics Concern  . Not on file   Social History Narrative  . No narrative on file    Family History  Problem Relation Age of Onset  . Anemia    . Diabetes      Past Medical History  Diagnosis Date  . Diabetes mellitus type II   . HTN (hypertension)   . HIV (human immunodeficiency virus infection)   . 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  . 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 CAD (coronary artery disease)     DES.  /  ... Later (10/2007) intervention for in-stent restenosis ;  EF 60%... catheterization... June, 2009  . Sinus tachycardia     mild at rest.... October, 2010  . Kidney calculi   . Ejection fraction     EF 60%, catheterization, 2009    Past Surgical History  Procedure Laterality Date  . Cholecystectomy    . Coronary stent placement      Patient Active Problem List   Diagnosis Date Noted  . Ejection fraction   . HTN (hypertension)   .  Kidney stone on left side 01/04/2011  . DKA (diabetic ketoacidoses) 01/04/2011  . CAD (coronary artery disease)   . Sinus tachycardia   . Secondary syphilis   . LFTs abnormal   . 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 DIABETES MELLITUS, TYPE II 02/16/2007  . HYPERTENSION 02/16/2007    ROS   Patient denies fever, chills, headache, sweats, rash, change in vision, change in hearing, chest pain, cough, nausea vomiting, urinary symptoms. All other systems are reviewed and are negative.  PHYSICAL EXAM  Patient is overweight but stable. He is oriented to person time and place. Affect is normal. There is no jugulovenous distention. Lungs are clear. Respiratory effort is not labored. Cardiac exam reveals S1-S2. There no clicks or significant murmurs. The abdomen is soft. There is no peripheral edema.  Filed Vitals:   12/21/12 1617  BP: 118/72    Pulse: 84  Height: 5\' 6"  (1.676 m)  Weight: 199 lb (90.266 kg)   EKG is done today and reviewed by me. There is normal sinus rhythm. There is no significant change.  ASSESSMENT & PLAN

## 2012-12-21 NOTE — Assessment & Plan Note (Signed)
Patient remains on beta blocker. His rate is controlled with this. No change in therapy. I'll see him back in one year.

## 2012-12-21 NOTE — Assessment & Plan Note (Signed)
The patient is on guideline directed dosing of Crestor. His labs are followed by his primary physician. No change.

## 2012-12-21 NOTE — Assessment & Plan Note (Signed)
Coronary disease is stable. The patient had his drug-eluting stent in 2009. We will stop his Plavix now. He will continue aspirin.

## 2012-12-21 NOTE — Patient Instructions (Addendum)
Your physician has recommended you make the following change in your medication: stop taking Plavix  Your physician wants you to follow-up in: 1 year. You will receive a reminder letter in the mail two months in advance. If you don't receive a letter, please call our office to schedule the follow-up appointment.   

## 2013-01-09 ENCOUNTER — Other Ambulatory Visit (INDEPENDENT_AMBULATORY_CARE_PROVIDER_SITE_OTHER): Payer: 59

## 2013-01-09 DIAGNOSIS — Z113 Encounter for screening for infections with a predominantly sexual mode of transmission: Secondary | ICD-10-CM

## 2013-01-09 DIAGNOSIS — B20 Human immunodeficiency virus [HIV] disease: Secondary | ICD-10-CM

## 2013-01-09 LAB — CBC WITH DIFFERENTIAL/PLATELET
Basophils Absolute: 0 10*3/uL (ref 0.0–0.1)
Basophils Relative: 1 % (ref 0–1)
Eosinophils Absolute: 0.3 10*3/uL (ref 0.0–0.7)
Eosinophils Relative: 4 % (ref 0–5)
HCT: 43.7 % (ref 39.0–52.0)
Hemoglobin: 15.4 g/dL (ref 13.0–17.0)
Lymphocytes Relative: 39 % (ref 12–46)
Lymphs Abs: 2.5 10*3/uL (ref 0.7–4.0)
MCH: 29.1 pg (ref 26.0–34.0)
MCHC: 35.2 g/dL (ref 30.0–36.0)
MCV: 82.6 fL (ref 78.0–100.0)
Monocytes Absolute: 0.6 10*3/uL (ref 0.1–1.0)
Monocytes Relative: 9 % (ref 3–12)
Neutro Abs: 3 10*3/uL (ref 1.7–7.7)
Neutrophils Relative %: 47 % (ref 43–77)
Platelets: 326 10*3/uL (ref 150–400)
RBC: 5.29 MIL/uL (ref 4.22–5.81)
RDW: 13.3 % (ref 11.5–15.5)
WBC: 6.3 10*3/uL (ref 4.0–10.5)

## 2013-01-10 LAB — COMPREHENSIVE METABOLIC PANEL
ALT: 30 U/L (ref 0–53)
AST: 21 U/L (ref 0–37)
Albumin: 4.2 g/dL (ref 3.5–5.2)
Alkaline Phosphatase: 82 U/L (ref 39–117)
BUN: 21 mg/dL (ref 6–23)
CO2: 29 mEq/L (ref 19–32)
Calcium: 10 mg/dL (ref 8.4–10.5)
Chloride: 98 mEq/L (ref 96–112)
Creat: 0.93 mg/dL (ref 0.50–1.35)
Glucose, Bld: 194 mg/dL — ABNORMAL HIGH (ref 70–99)
Potassium: 4.2 mEq/L (ref 3.5–5.3)
Sodium: 135 mEq/L (ref 135–145)
Total Bilirubin: 0.3 mg/dL (ref 0.3–1.2)
Total Protein: 6.8 g/dL (ref 6.0–8.3)

## 2013-01-10 LAB — RPR

## 2013-01-10 LAB — T-HELPER CELL (CD4) - (RCID CLINIC ONLY)
CD4 % Helper T Cell: 49 % (ref 33–55)
CD4 T Cell Abs: 1270 uL (ref 400–2700)

## 2013-01-11 LAB — HIV-1 RNA QUANT-NO REFLEX-BLD
HIV 1 RNA Quant: <20 copies/mL (ref <20)
HIV-1 RNA Quant, Log: <1.3 {Log} (ref <1.30)

## 2013-01-25 ENCOUNTER — Encounter: Payer: Self-pay | Admitting: Infectious Disease

## 2013-01-25 ENCOUNTER — Ambulatory Visit (INDEPENDENT_AMBULATORY_CARE_PROVIDER_SITE_OTHER): Payer: 59 | Admitting: Infectious Disease

## 2013-01-25 VITALS — BP 154/95 | HR 97 | Temp 97.0°F | Ht 66.0 in | Wt 195.5 lb

## 2013-01-25 DIAGNOSIS — K759 Inflammatory liver disease, unspecified: Secondary | ICD-10-CM

## 2013-01-25 DIAGNOSIS — B20 Human immunodeficiency virus [HIV] disease: Secondary | ICD-10-CM

## 2013-01-25 DIAGNOSIS — I25810 Atherosclerosis of coronary artery bypass graft(s) without angina pectoris: Secondary | ICD-10-CM

## 2013-01-25 DIAGNOSIS — E1165 Type 2 diabetes mellitus with hyperglycemia: Secondary | ICD-10-CM

## 2013-01-25 DIAGNOSIS — A539 Syphilis, unspecified: Secondary | ICD-10-CM

## 2013-01-25 DIAGNOSIS — I10 Essential (primary) hypertension: Secondary | ICD-10-CM

## 2013-01-25 DIAGNOSIS — E7800 Pure hypercholesterolemia, unspecified: Secondary | ICD-10-CM | POA: Insufficient documentation

## 2013-01-25 DIAGNOSIS — IMO0002 Reserved for concepts with insufficient information to code with codable children: Secondary | ICD-10-CM

## 2013-01-25 DIAGNOSIS — A5149 Other secondary syphilitic conditions: Secondary | ICD-10-CM | POA: Insufficient documentation

## 2013-01-25 DIAGNOSIS — E785 Hyperlipidemia, unspecified: Secondary | ICD-10-CM

## 2013-01-25 NOTE — Patient Instructions (Addendum)
Consider change to Complera  with food 400 calorie meal with fat in it and with avoidance of proton pump inhibitors  Such a change MIGHT improve your cholesterol profile

## 2013-01-25 NOTE — Progress Notes (Signed)
  Subjective:    Patient ID: [name], male    DOB: 08-11-1965, 47 y.o.   MRN: [id number]  HPI  47 year old Caucasian man with HIV perfectly controlled with Atripla since first started on it. He does have comorbid CAD< DM, difficult to control hyperlipidemia, HTN who was taken care of by Melven Sartorius MD at [facility] who is currently retiring and pt wishes to change to [facility] for care.  We discussed possible switch to complera for improved lipids and he will consider  Review of Systems  Constitutional: Negative for fever, chills, diaphoresis, activity change, appetite change, fatigue and unexpected weight change.  HENT: Negative for congestion, sore throat, rhinorrhea, sneezing, trouble swallowing and sinus pressure.   Eyes: Negative for photophobia and visual disturbance.  Respiratory: Negative for cough, chest tightness, shortness of breath, wheezing and stridor.   Cardiovascular: Negative for chest pain, palpitations and leg swelling.  Gastrointestinal: Negative for nausea, vomiting, abdominal pain, diarrhea, constipation, blood in stool, abdominal distention and anal bleeding.  Genitourinary: Negative for dysuria, hematuria, flank pain and difficulty urinating.  Musculoskeletal: Negative for myalgias, back pain, joint swelling, arthralgias and gait problem.  Skin: Negative for color change, pallor, rash and wound.  Neurological: Negative for dizziness, tremors, weakness and light-headedness.  Hematological: Negative for adenopathy. Does not bruise/bleed easily.  Psychiatric/Behavioral: Negative for behavioral problems, confusion, sleep disturbance, dysphoric mood, decreased concentration and agitation.       Objective:   Physical Exam  Constitutional: He is oriented to person, place, and time. He appears well-developed and well-nourished. No distress.  HENT:  Head: Normocephalic and atraumatic.  Mouth/Throat: Oropharynx is clear and moist. No oropharyngeal exudate.  Eyes: Conjunctivae  and EOM are normal. Pupils are equal, round, and reactive to light. No scleral icterus.  Neck: Normal range of motion. Neck supple. No JVD present.  Cardiovascular: Normal rate, regular rhythm and normal heart sounds.  Exam reveals no gallop and no friction rub.   No murmur heard. Pulmonary/Chest: Effort normal and breath sounds normal. No respiratory distress. He has no wheezes. He has no rales. He exhibits no tenderness.  Abdominal: He exhibits no distension and no mass. There is no tenderness. There is no rebound and no guarding.  Musculoskeletal: He exhibits no edema and no tenderness.  Lymphadenopathy:    He has no cervical adenopathy.  Neurological: He is alert and oriented to person, place, and time. He has normal reflexes. He exhibits normal muscle tone. Coordination normal.  Skin: Skin is warm and dry. He is not diaphoretic. No erythema. No pallor.  Psychiatric: He has a normal mood and affect. His behavior is normal. Judgment and thought content normal.          Assessment & Plan:  HIV: perfect control, consider switch to compera, otherwise rtc in 2 months  HCM: will get flu shot at work  HTN: did not take bp meds today, sees Carylon Perches  Hyperlipidemia: needs LDL rechecked on higher dose of crestor  CAD: on beta blocker, acei, statin, asa  Syphliitic hepatitis: resolved

## 2013-03-01 ENCOUNTER — Other Ambulatory Visit: Payer: Self-pay | Admitting: Cardiology

## 2013-03-10 ENCOUNTER — Encounter: Payer: Self-pay | Admitting: *Deleted

## 2013-03-29 ENCOUNTER — Ambulatory Visit: Payer: 59 | Admitting: Infectious Disease

## 2013-03-30 ENCOUNTER — Other Ambulatory Visit: Payer: Self-pay

## 2013-05-02 ENCOUNTER — Ambulatory Visit (INDEPENDENT_AMBULATORY_CARE_PROVIDER_SITE_OTHER): Payer: 59 | Admitting: Infectious Disease

## 2013-05-02 VITALS — BP 153/81 | HR 76 | Temp 98.6°F | Wt 202.0 lb

## 2013-05-02 DIAGNOSIS — I10 Essential (primary) hypertension: Secondary | ICD-10-CM

## 2013-05-02 DIAGNOSIS — B20 Human immunodeficiency virus [HIV] disease: Secondary | ICD-10-CM

## 2013-05-02 DIAGNOSIS — I25810 Atherosclerosis of coronary artery bypass graft(s) without angina pectoris: Secondary | ICD-10-CM

## 2013-05-02 DIAGNOSIS — A539 Syphilis, unspecified: Secondary | ICD-10-CM

## 2013-05-02 DIAGNOSIS — E1069 Type 1 diabetes mellitus with other specified complication: Secondary | ICD-10-CM

## 2013-05-02 DIAGNOSIS — E108 Type 1 diabetes mellitus with unspecified complications: Secondary | ICD-10-CM

## 2013-05-02 DIAGNOSIS — E785 Hyperlipidemia, unspecified: Secondary | ICD-10-CM

## 2013-05-02 MED ORDER — EMTRICITAB-RILPIVIR-TENOFOV DF 200-25-300 MG PO TABS: 1.0000 | ORAL_TABLET | Freq: Every day | ORAL | Status: DC

## 2013-05-02 NOTE — Progress Notes (Signed)
  Subjective:    Patient ID: [name], male    DOB: [birth year], 47 y.o.   MRN: [id number]  HPI   47 year old Caucasian man with HIV perfectly controlled with Atripla since first started on it. He does have comorbid CAD< DM, difficult to control hyperlipidemia, HTN who was taken care of by Melven Sartorius MD at [facility]  Retired and pt moved [facility] for care.  We again discussed change to complera and pt willing to make switch.     Review of Systems  Constitutional: Negative for fever, chills, diaphoresis, activity change, appetite change, fatigue and unexpected weight change.  HENT: Negative for congestion, rhinorrhea, sinus pressure, sneezing, sore throat and trouble swallowing.   Eyes: Negative for photophobia and visual disturbance.  Respiratory: Negative for cough, chest tightness, shortness of breath, wheezing and stridor.   Cardiovascular: Negative for chest pain, palpitations and leg swelling.  Gastrointestinal: Negative for nausea, vomiting, abdominal pain, diarrhea, constipation, blood in stool, abdominal distention and anal bleeding.  Genitourinary: Negative for dysuria, hematuria, flank pain and difficulty urinating.  Musculoskeletal: Negative for arthralgias, back pain, gait problem, joint swelling and myalgias.  Skin: Negative for color change, pallor, rash and wound.  Neurological: Negative for dizziness, tremors, weakness and light-headedness.  Hematological: Negative for adenopathy. Does not bruise/bleed easily.  Psychiatric/Behavioral: Negative for behavioral problems, confusion, sleep disturbance, dysphoric mood, decreased concentration and agitation.       Objective:   Physical Exam  Constitutional: He is oriented to person, place, and time. He appears well-developed and well-nourished. No distress.  HENT:  Head: Normocephalic and atraumatic.  Mouth/Throat: Oropharynx is clear and moist. No oropharyngeal exudate.  Eyes: Conjunctivae and EOM are normal. Pupils are equal,  round, and reactive to light. No scleral icterus.  Neck: Normal range of motion. Neck supple. No JVD present.  Cardiovascular: Normal rate, regular rhythm and normal heart sounds.  Exam reveals no gallop and no friction rub.   No murmur heard. Pulmonary/Chest: Effort normal and breath sounds normal. No respiratory distress. He has no wheezes. He has no rales. He exhibits no tenderness.  Abdominal: He exhibits no distension and no mass. There is no tenderness. There is no rebound and no guarding.  Musculoskeletal: He exhibits no edema and no tenderness.  Lymphadenopathy:    He has no cervical adenopathy.  Neurological: He is alert and oriented to person, place, and time. He has normal reflexes. He exhibits normal muscle tone. Coordination normal.  Skin: Skin is warm and dry. He is not diaphoretic. No erythema. No pallor.  Psychiatric: He has a normal mood and affect. His behavior is normal. Judgment and thought content normal.          Assessment & Plan:  HIV: check labs today and switch to complera with food and recheck labs in 2 months I spent greater than 20 minutes with the patient including greater than 50% of time in face to face counsel of the patient and in coordination of their care.   HTN: bp up again today, ? White coat htn, follows with  Carylon Perches  Hyperlipidemia: needs LDL rechecked on higher dose of crestor  CAD: on beta blocker, acei, statin, asa, plavix dc'd  Syphliitic hepatitis: resolved

## 2013-05-03 LAB — COMPLETE METABOLIC PANEL WITH GFR
ALT: 26 U/L (ref 0–53)
AST: 18 U/L (ref 0–37)
Albumin: 4 g/dL (ref 3.5–5.2)
Alkaline Phosphatase: 82 U/L (ref 39–117)
BUN: 17 mg/dL (ref 6–23)
CO2: 26 mEq/L (ref 19–32)
Calcium: 9.8 mg/dL (ref 8.4–10.5)
Chloride: 100 mEq/L (ref 96–112)
Creat: 0.71 mg/dL (ref 0.50–1.35)
GFR, Est African American: >89 mL/min
GFR, Est Non African American: >89 mL/min
Glucose, Bld: 225 mg/dL — ABNORMAL HIGH (ref 70–99)
Potassium: 4.3 mEq/L (ref 3.5–5.3)
Sodium: 136 mEq/L (ref 135–145)
Total Bilirubin: 0.2 mg/dL — ABNORMAL LOW (ref 0.3–1.2)
Total Protein: 6.5 g/dL (ref 6.0–8.3)

## 2013-05-04 LAB — HIV-1 RNA QUANT-NO REFLEX-BLD
HIV 1 RNA Quant: <20 copies/mL (ref <20)
HIV-1 RNA Quant, Log: <1.3 {Log} (ref <1.30)

## 2013-06-09 ENCOUNTER — Other Ambulatory Visit: Payer: Self-pay | Admitting: Cardiology

## 2013-06-12 ENCOUNTER — Other Ambulatory Visit: Payer: 59

## 2013-06-12 DIAGNOSIS — B20 Human immunodeficiency virus [HIV] disease: Secondary | ICD-10-CM

## 2013-06-12 LAB — COMPLETE METABOLIC PANEL WITH GFR
ALT: 22 U/L (ref 0–53)
AST: 16 U/L (ref 0–37)
Albumin: 4.1 g/dL (ref 3.5–5.2)
Alkaline Phosphatase: 70 U/L (ref 39–117)
BUN: 19 mg/dL (ref 6–23)
CO2: 28 mEq/L (ref 19–32)
Calcium: 9.1 mg/dL (ref 8.4–10.5)
Chloride: 102 mEq/L (ref 96–112)
Creat: 0.82 mg/dL (ref 0.50–1.35)
GFR, Est African American: >89 mL/min
GFR, Est Non African American: >89 mL/min
Glucose, Bld: 258 mg/dL — ABNORMAL HIGH (ref 70–99)
Potassium: 4.1 mEq/L (ref 3.5–5.3)
Sodium: 140 mEq/L (ref 135–145)
Total Bilirubin: 0.2 mg/dL — ABNORMAL LOW (ref 0.3–1.2)
Total Protein: 6.3 g/dL (ref 6.0–8.3)

## 2013-06-12 LAB — CBC WITH DIFFERENTIAL/PLATELET
Basophils Absolute: 0 10*3/uL (ref 0.0–0.1)
Basophils Relative: 1 % (ref 0–1)
Eosinophils Absolute: 0.4 10*3/uL (ref 0.0–0.7)
Eosinophils Relative: 6 % — ABNORMAL HIGH (ref 0–5)
HCT: 42.1 % (ref 39.0–52.0)
Hemoglobin: 14.8 g/dL (ref 13.0–17.0)
Lymphocytes Relative: 45 % (ref 12–46)
Lymphs Abs: 2.6 10*3/uL (ref 0.7–4.0)
MCH: 28.6 pg (ref 26.0–34.0)
MCHC: 35.2 g/dL (ref 30.0–36.0)
MCV: 81.4 fL (ref 78.0–100.0)
Monocytes Absolute: 0.5 10*3/uL (ref 0.1–1.0)
Monocytes Relative: 8 % (ref 3–12)
Neutro Abs: 2.3 10*3/uL (ref 1.7–7.7)
Neutrophils Relative %: 40 % — ABNORMAL LOW (ref 43–77)
Platelets: 293 10*3/uL (ref 150–400)
RBC: 5.17 MIL/uL (ref 4.22–5.81)
RDW: 13.6 % (ref 11.5–15.5)
WBC: 5.8 10*3/uL (ref 4.0–10.5)

## 2013-06-13 LAB — RPR

## 2013-06-13 LAB — HIV-1 RNA QUANT-NO REFLEX-BLD
HIV 1 RNA Quant: <20 copies/mL (ref <20)
HIV-1 RNA Quant, Log: <1.3 {Log} (ref <1.30)

## 2013-06-14 LAB — T-HELPER CELL (CD4) - (RCID CLINIC ONLY)
CD4 % Helper T Cell: 48 % (ref 33–55)
CD4 T Cell Abs: 1380 /uL (ref 400–2700)

## 2013-07-02 ENCOUNTER — Emergency Department (HOSPITAL_COMMUNITY)
Admission: EM | Admit: 2013-07-02 | Discharge: 2013-07-02 | Disposition: A | Discharge status: Home / Self Care | Payer: 59

## 2013-07-02 NOTE — ED Notes (Addendum)
ACCORDING TO ELLA WITH REGISTRATION, PT WAS UPSET THAT HE HAD TO WAIT. PT WANTED TO GO STRAIGHT BACK BECAUSE HE WAS A [employer] EMPLOYEE.

## 2013-07-04 ENCOUNTER — Ambulatory Visit (INDEPENDENT_AMBULATORY_CARE_PROVIDER_SITE_OTHER): Payer: 59 | Admitting: Infectious Disease

## 2013-07-04 ENCOUNTER — Encounter: Payer: Self-pay | Admitting: Infectious Disease

## 2013-07-04 VITALS — BP 129/80 | HR 118 | Temp 98.4°F | Wt 202.0 lb

## 2013-07-04 DIAGNOSIS — I10 Essential (primary) hypertension: Secondary | ICD-10-CM

## 2013-07-04 DIAGNOSIS — N200 Calculus of kidney: Secondary | ICD-10-CM

## 2013-07-04 DIAGNOSIS — E785 Hyperlipidemia, unspecified: Secondary | ICD-10-CM

## 2013-07-04 DIAGNOSIS — A5274 Syphilis of liver and other viscera: Secondary | ICD-10-CM

## 2013-07-04 DIAGNOSIS — B20 Human immunodeficiency virus [HIV] disease: Secondary | ICD-10-CM

## 2013-07-04 DIAGNOSIS — I25810 Atherosclerosis of coronary artery bypass graft(s) without angina pectoris: Secondary | ICD-10-CM

## 2013-07-04 DIAGNOSIS — I2510 Atherosclerotic heart disease of native coronary artery without angina pectoris: Secondary | ICD-10-CM

## 2013-07-04 NOTE — Progress Notes (Signed)
   Patient ID: [name], male    DOB: Oct 16, 1965, 48 y.o.   MRN: [id number]  HPI   48 year old Caucasian man with HIV perfectly controlled with Atripla since first started on it. He does have comorbid CAD< DM, difficult to control hyperlipidemia, HTN who was taken care of by Bonner Puna MD at [facility]  Retired and pt moved [facility] for care.  We  changed to complera and he is suppressed on this medicine. He did say that some of his friends have felt he is more short tempered than usual but he is not sure if this is related to the medicine.   He also recently did pass a kidney stone this weekend when he had severe Right groin pain, LBP and passed a stone that he saved.     Review of Systems  Constitutional: Negative for fever, chills, diaphoresis, activity change, appetite change, fatigue and unexpected weight change.  HENT: Negative for congestion, rhinorrhea, sinus pressure, sneezing, sore throat and trouble swallowing.   Eyes: Negative for photophobia and visual disturbance.  Respiratory: Negative for cough, chest tightness, shortness of breath, wheezing and stridor.   Cardiovascular: Negative for chest pain, palpitations and leg swelling.  Gastrointestinal: Negative for nausea, vomiting, abdominal pain, diarrhea, constipation, blood in stool, abdominal distention and anal bleeding.  Genitourinary: Positive for hematuria, flank pain and difficulty urinating. Negative for dysuria.  Musculoskeletal: Negative for arthralgias, back pain, gait problem, joint swelling and myalgias.  Skin: Negative for color change, pallor, rash and wound.  Neurological: Negative for dizziness, tremors, weakness and light-headedness.  Hematological: Negative for adenopathy. Does not bruise/bleed easily.  Psychiatric/Behavioral: Negative for behavioral problems, confusion, sleep disturbance, dysphoric mood, decreased concentration and agitation.       Objective:   Physical Exam  Constitutional: He is oriented to  person, place, and time. He appears well-developed and well-nourished. No distress.  HENT:  Head: Normocephalic and atraumatic.  Mouth/Throat: Oropharynx is clear and moist. No oropharyngeal exudate.  Eyes: Conjunctivae and EOM are normal. Pupils are equal, round, and reactive to light. No scleral icterus.  Neck: Normal range of motion. Neck supple. No JVD present.  Cardiovascular: Normal rate, regular rhythm and normal heart sounds.  Exam reveals no gallop and no friction rub.   No murmur heard. Pulmonary/Chest: Effort normal and breath sounds normal. No respiratory distress. He has no wheezes. He has no rales. He exhibits no tenderness.  Abdominal: He exhibits no distension and no mass. There is no tenderness. There is no rebound and no guarding.  Musculoskeletal: He exhibits no edema and no tenderness.  Lymphadenopathy:    He has no cervical adenopathy.  Neurological: He is alert and oriented to person, place, and time. He has normal reflexes. He exhibits normal muscle tone. Coordination normal.  Skin: Skin is warm and dry. He is not diaphoretic. No erythema. No pallor.  Psychiatric: He has a normal mood and affect. His behavior is normal. Judgment and thought content normal.          Assessment & Plan:  HIV: Continue Complera. I spent 25 minutes 50% of time in face to face counsel of the patient and in coordination of their care.  Kidney stone: could try to have stone analyzed vs 24 hour urine  [id number] with  Asencion Noble  Hyperlipidemia: needs LDL rechecked on higher dose of crestor  CAD: on beta blocker, acei, statin, asa, plavix dc'd  Syphliitic hepatitis: resolved

## 2013-08-15 ENCOUNTER — Ambulatory Visit (INDEPENDENT_AMBULATORY_CARE_PROVIDER_SITE_OTHER): Payer: Self-pay | Admitting: Family Medicine

## 2013-08-15 VITALS — BP 138/74 | Wt 202.0 lb

## 2013-08-15 DIAGNOSIS — E119 Type 2 diabetes mellitus without complications: Secondary | ICD-10-CM

## 2013-08-15 NOTE — Progress Notes (Signed)
Patient presents today for 3 month diabetes follow-up as part of the employer-sponsored Link to Wellness program. Current diabetes regimen includes Lantus and Novolog. Patient also continues on daily ASA, ACEi, and statin. Most recent MD follow-up with PCP was Aug 2014. Patient does not have a follow-up scheduled but is due for an appt. Patient continues seeing cardiologist once yearly. No med changes or major health changes at this time.   Diabetes Assessment: Type of Diabetes: Type 2; Sees Diabetes provider 2 times per year; Year of diagnosis 1999; MD managing Diabetes Asencion Noble MD; checks feet daily; uses glucometer One Touch Ultra; takes an aspirin a day; takes medications as prescribed; checks blood glucose 1-2 times a day; hypoglycemia frequency none;  Diabetes Education Completed DM/Nutrition classes; Highest CBG 345; Lowest CBG 97; A1c 9.2 Other Diabetes History: Current med regimen includes Lantus 85 units nightly and Novolog sliding scale with each meal (8 units plus correction factor). Patient does not count carbs or dose for carbs consumed. Patient does maintain good medication compliance, but admits to occassionally skipping his meal time insulin dose. Patient did not bring meter today but is currently testing 1 time per day. Glucose monitoring occurs fasting and when symptomatic. Per pt report fasting usually runs 150 or less, but has reach as high as 354 and as low as 97. He only rarely tests around meal times. Hypoglycemia frequency is rare. Patient denies signs and symptoms of neuropathy including numbness/tingling/burning and symptoms of foot infection. Patient is not due for yearly eye exam.  Lifestyle Factors: Diet - Patient feels that he maintains a healthy diet. He reports that he grew up with a family member with Type 1 diabetes, so was raised on healthy eating and being aware of healthy dietary choices. He provides the following food recall.  Breakfast - Bagel, cream cheese Lunch -  Salad from cafeteria, fat free dressing, vegetables from hot line Supper - PBJ sandwich, soup, sometimes out to eat once per week, usually has pizza once per week with his mother Snacks - very little Beverages - Diet coke only Sweets - very little Fruit/Veg Servings - two with lunch, no fruit, one serving with supper some nights Portion sizes - fairly well controlled according to patient Patient does admit to splurging most weekends. Exercise - No routine exercise during the winter months but has started walking since weather has improved. He walks the trails in [place] (downtown and [place] Walks dogs at least three times per week, for 20-30 min each time. Patient is working up to 30 minute walks.   Assessment: Patient presents today with unchanged and elevated A1c of 9.2 and in need of follow-up with MD. He does a fair job of controlling diet but could use further improvement. He has recently resumed exercise and I am hopeful he will continue. Patient will follow-up in 3 months.  Plan: 1) Continue to make healthy dietary choices and limit starchy foods 2) Continue to exercise at least 2-3 days per week, work up to 30 minutes per walk 3) Continue to testing regularly, at least once daily, and attempt to test prior to meals 4) Schedule follow-up appt with Dr. Willey Blade for routine DM exam 5) Follow-up in 3 months on Tuesday June 23rd @ 1:30 pm

## 2013-09-11 ENCOUNTER — Other Ambulatory Visit: Payer: Self-pay | Admitting: Cardiology

## 2013-11-14 ENCOUNTER — Ambulatory Visit (INDEPENDENT_AMBULATORY_CARE_PROVIDER_SITE_OTHER): Payer: Self-pay | Admitting: Family Medicine

## 2013-11-14 DIAGNOSIS — E118 Type 2 diabetes mellitus with unspecified complications: Secondary | ICD-10-CM

## 2013-11-16 NOTE — Progress Notes (Signed)
Patient presents today for 3 month diabetes follow-up as part of the employer-sponsored Link to Wellness program. 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 Current diabetes regimen includes Lantus and novolog. Patient also continues on daily ASA, ACEi, and statin. Patient has as follow-up with PCP scheduled this week and will follow-up with cardiologist in July. No med changes or major health changes at this time.  Diabetes Assessment: Type of Diabetes: Type 2; Sees Diabetes provider 2 times per year; MD managing Diabetes Asencion Noble MD; checks feet daily; takes an aspirin a day; checks blood glucose 2 times a day Testing less while sick; uses glucometer One Touch Ultra; Year of diagnosis 1999; takes medications as prescribed Pt reports he is "religious" regarding taking his medications.; Lowest CBG Had not eaten enough; A1c 9.2 (02/21/13) Other Diabetes History: Current med regimen includes Lantus 85 units nightly and Novolog sliding scale, usually 5-8 units before meals. Patient is on a high dose of Lantus and a low dose of novolog for meal coverage. I have asked pt to speak with MD about adjustment to meal coverage for better control and to acheive at-goal A1c. Patient does maintain good medication compliance. Patient did not bring meter today but is currently testing 2 times per day. Glucose monitoring occurs fasting and when symptomatic. Hypoglycemia frequency is rare. Patient does demonstrate appropriate correction of hypoglycemia. Patient denies signs and symptoms of neuropathy including numbness/tingling/burning and symptoms of foot infection. Patient is not due for yearly eye exam.  Lifestyle Factors: Patient presents today for follow-up. He was seen by Peter Garter, RN last month, so this visit was very brief. He will see his MD this week as well. Patient has significant room for improvement in diet and exercise, but is limited with regard to  exercise due to extensive cardiac history. Insulin also appears to need adjusting in order to bring A1c to goal. Patient will be transitioned to pharmacy care at this time and I will follow-up in 2 months given elevated A1c.  Plan:  1) Continue to make healthy dietary choices 2) Exercise as much as possible, attempt to exercise indoors to avoid heat 3) Continue testing regularly, attempt to test after meals to determine adequacy of bolus dose 4) Follow-up with MD in two days 5) Follow-up with [facility] in 3 months on 01/16/14 at 1:30 pm

## 2013-12-20 ENCOUNTER — Other Ambulatory Visit: Payer: 59

## 2013-12-20 DIAGNOSIS — Z7983 Long term (current) use of bisphosphonates: Secondary | ICD-10-CM

## 2013-12-20 DIAGNOSIS — Z113 Encounter for screening for infections with a predominantly sexual mode of transmission: Secondary | ICD-10-CM

## 2013-12-20 DIAGNOSIS — B20 Human immunodeficiency virus [HIV] disease: Secondary | ICD-10-CM

## 2013-12-20 LAB — CBC WITH DIFFERENTIAL/PLATELET
Basophils Absolute: 0 10*3/uL (ref 0.0–0.1)
Basophils Relative: 0 % (ref 0–1)
Eosinophils Absolute: 0.3 10*3/uL (ref 0.0–0.7)
Eosinophils Relative: 4 % (ref 0–5)
HCT: 43 % (ref 39.0–52.0)
Hemoglobin: 14.8 g/dL (ref 13.0–17.0)
Lymphocytes Relative: 38 % (ref 12–46)
Lymphs Abs: 2.4 10*3/uL (ref 0.7–4.0)
MCH: 28.8 pg (ref 26.0–34.0)
MCHC: 34.4 g/dL (ref 30.0–36.0)
MCV: 83.8 fL (ref 78.0–100.0)
Monocytes Absolute: 0.5 10*3/uL (ref 0.1–1.0)
Monocytes Relative: 8 % (ref 3–12)
Neutro Abs: 3.2 10*3/uL (ref 1.7–7.7)
Neutrophils Relative %: 50 % (ref 43–77)
Platelets: 300 10*3/uL (ref 150–400)
RBC: 5.13 MIL/uL (ref 4.22–5.81)
RDW: 14.2 % (ref 11.5–15.5)
WBC: 6.4 10*3/uL (ref 4.0–10.5)

## 2013-12-20 NOTE — Addendum Note (Signed)
Addended by: Dolan Amen D on: 12/20/2013 02:32 PM   Modules accepted: Orders

## 2013-12-21 ENCOUNTER — Encounter: Payer: Self-pay | Admitting: Cardiology

## 2013-12-21 ENCOUNTER — Ambulatory Visit (INDEPENDENT_AMBULATORY_CARE_PROVIDER_SITE_OTHER): Payer: 59 | Admitting: Cardiology

## 2013-12-21 VITALS — BP 139/85 | HR 93 | Ht 66.0 in | Wt 209.4 lb

## 2013-12-21 DIAGNOSIS — E785 Hyperlipidemia, unspecified: Secondary | ICD-10-CM

## 2013-12-21 DIAGNOSIS — I498 Other specified cardiac arrhythmias: Secondary | ICD-10-CM

## 2013-12-21 DIAGNOSIS — E119 Type 2 diabetes mellitus without complications: Secondary | ICD-10-CM

## 2013-12-21 DIAGNOSIS — R000 Tachycardia, unspecified: Secondary | ICD-10-CM

## 2013-12-21 DIAGNOSIS — I2510 Atherosclerotic heart disease of native coronary artery without angina pectoris: Secondary | ICD-10-CM

## 2013-12-21 LAB — COMPLETE METABOLIC PANEL WITH GFR
ALT: 47 U/L (ref 0–53)
AST: 31 U/L (ref 0–37)
Albumin: 4 g/dL (ref 3.5–5.2)
Alkaline Phosphatase: 63 U/L (ref 39–117)
BUN: 16 mg/dL (ref 6–23)
CO2: 22 mEq/L (ref 19–32)
Calcium: 9.6 mg/dL (ref 8.4–10.5)
Chloride: 100 mEq/L (ref 96–112)
Creat: 0.88 mg/dL (ref 0.50–1.35)
GFR, Est African American: >89 mL/min
GFR, Est Non African American: >89 mL/min
Glucose, Bld: 275 mg/dL — ABNORMAL HIGH (ref 70–99)
Potassium: 4.2 mEq/L (ref 3.5–5.3)
Sodium: 140 mEq/L (ref 135–145)
Total Bilirubin: 0.3 mg/dL (ref 0.2–1.2)
Total Protein: 6.4 g/dL (ref 6.0–8.3)

## 2013-12-21 LAB — HEPATITIS C ANTIBODY: HCV Ab: NEGATIVE

## 2013-12-21 LAB — LIPID PANEL
Cholesterol: 134 mg/dL (ref 0–200)
HDL: 29 mg/dL — ABNORMAL LOW (ref >39)
Total CHOL/HDL Ratio: 4.6 Ratio
Triglycerides: 1157 mg/dL — ABNORMAL HIGH (ref <150)

## 2013-12-21 LAB — MICROALBUMIN / CREATININE URINE RATIO
Creatinine, Urine: 71.5 mg/dL
Microalb Creat Ratio: 15.1 mg/g (ref 0.0–30.0)
Microalb, Ur: 1.08 mg/dL (ref 0.00–1.89)

## 2013-12-21 LAB — T-HELPER CELL (CD4) - (RCID CLINIC ONLY)
CD4 % Helper T Cell: 47 % (ref 33–55)
CD4 T Cell Abs: 1200 /uL (ref 400–2700)

## 2013-12-21 LAB — RPR

## 2013-12-21 NOTE — Assessment & Plan Note (Signed)
His hemoglobin A1c has been elevated. I don't know if this is playing a role with his elevated triglycerides.

## 2013-12-21 NOTE — Assessment & Plan Note (Signed)
He has mild resting increased heart rate. His rate is now 90. No change in therapy.

## 2013-12-21 NOTE — Patient Instructions (Addendum)
Your physician recommends that you continue on your current medications as directed. Please refer to the Current Medication list given to you today.  Your physician wants you to follow-up in: 1 year. You will receive a reminder letter in the mail two months in advance. If you don't receive a letter, please call our office to schedule the follow-up appointment.  Please contact Dr Willey Blade today or tomorrow to follow up on your triglyceride level. We have faxed a copy of your lab work to his office.

## 2013-12-21 NOTE — Assessment & Plan Note (Addendum)
He is on 40 mg of Crestor. However I am quite concerned about his labs. Triglycerides are in the range of 1100. HDL is low. LDL could not be calculated. I will send these results and this note to his primary physician Dr. Willey Blade. The labs were not fasting.  I will also have the patient contact Dr. Willey Blade for early followup. The patient tells me that Dr. Willey Blade has managed his lipids in the past. We could ask for help from our lipid clinic if this seems appropriate.

## 2013-12-21 NOTE — Assessment & Plan Note (Addendum)
Coronary disease is stable. His last intervention was 2009. He had an echo in 2013 with good LV function. I've chosen not to proceed with an exercise test at this time. We will consider this at the time of his next visit.  As part of today's evaluation I spent greater than 25 minutes with is total care. One half of this time has been with reviewing his information with him and trying to assess his lipid abnormalities.

## 2013-12-21 NOTE — Progress Notes (Signed)
Patient ID: [name], male   DOB: 1966-01-29, 48 y.o.   MRN: [id number]    HPI  Patient is seen today to followup coronary artery disease. I saw him last July, 2014. At that time we decided to stop his Plavix. He is on aspirin. He has not been having any significant chest pain. Labs had been by Dr.Van Dam yesterday.  These included a lipid profile that I have reviewed today. Patient had marked triglyceride elevation at 1100. HDL was low. LDL was not computed. He does tell me that his hemoglobin A1c has been elevated.  No Known Allergies  Current Outpatient Prescriptions  Medication Sig Dispense Refill  . aspirin 81 MG tablet Take 81 mg by mouth daily.        . chlorthalidone (HYGROTON) 25 MG tablet TAKE 1 TABLET BY MOUTH DAILY.  30 tablet  5  . CRESTOR 40 MG tablet TAKE 1 TABLET BY MOUTH DAILY.  90 tablet  0  . Emtricitab-Rilpivir-Tenofovir 200-25-300 MG TABS Take 1 tablet by mouth daily.  90 tablet  4  . insulin aspart (NOVOLOG FLEXPEN) 100 UNIT/ML injection Inject 8 Units into the skin 3 (three) times daily before meals. Base dose is 8 units, adjust with 1 unit increments based on  Blood Glucose levels      . insulin glargine (LANTUS SOLOSTAR) 100 UNIT/ML injection Inject 40 Units into the skin 2 (two) times daily.       [name] lisinopril (PRINIVIL,ZESTRIL) 10 MG tablet TAKE 1 TABLET BY MOUTH DAILY.  30 tablet  5  . metoprolol (TOPROL-XL) 100 MG 24 hr tablet Take 1 tablet (100 mg total) by mouth daily.  90 tablet  3   No current facility-administered medications for this visit.    History   Social History  . Marital Status: Single    Spouse Name: N/A    Number of Children: N/A  . Years of Education: N/A   Occupational History  . Not on file.   Social History Main Topics  . Smoking status: Never Smoker   . Smokeless tobacco: Not on file  . Alcohol Use: 0.0 oz/week    0-1 drink(s) per week  . Drug Use: No  . Sexual Activity: Not on file   Other Topics Concern  . Not on file     Social History Narrative  . No narrative on file    Family History  Problem Relation Age of Onset  . Anemia    . Diabetes      Past Medical History  Diagnosis Date  . Diabetes mellitus type II   . HTN (hypertension)   . HIV (human immunodeficiency virus infection)   . 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  . 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 CAD (coronary artery disease)     DES.  /  ... Later (10/2007) intervention for in-stent restenosis ;  EF 60%... catheterization... June, 2009  . Sinus tachycardia     mild at rest.... October, 2010  . Kidney calculi   . Ejection fraction     EF 60%, catheterization, 2009  . Dyslipidemia   . Syphilitic hepatitis   . Kidney stone     Past Surgical History  Procedure Laterality Date  . Cholecystectomy    . Coronary stent placement      Patient Active Problem List   Diagnosis Date Noted  .  Kidney stone   . Chronic kidney disease 01/25/2013  . Early syphilis, 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  . Hypercholesterolemia 01/25/2013  . Nonspecific abnormal finding of lung field 01/25/2013  . Coronary atherosclerosis 01/25/2013  . Ejection fraction   . HTN (hypertension)   . Dyslipidemia   . Type II diabetes mellitus 09/11/2011  . Human immunodeficiency virus (HIV) disease 09/11/2011  . Kidney stone on left side 01/04/2011  . DKA (diabetic ketoacidoses) 01/04/2011  . CAD (coronary artery disease)   . Sinus tachycardia   . Secondary syphilis   . LFTs abnormal   . 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 DIABETES MELLITUS, TYPE II 02/16/2007    ROS   Patient denies fever, chills, headache, sweats, rash, change in vision, change in hearing, chest pain, cough, nausea vomiting, urinary symptoms. All other systems are reviewed and are negative.  PHYSICAL EXAM  Patient is overweight.  He is oriented to person time and place. Affect is normal. Head is atraumatic. Sclera and conjunctiva are normal. There is no jugular venous distention. Lungs are clear. Respiratory effort is nonlabored. Cardiac exam reveals an S1 and S2. Abdomen is soft. There is no peripheral edema. There no musculoskeletal deformities. There are no skin rashes.  Filed Vitals:   12/21/13 1354  BP: 139/85  Pulse: 93  Height: 5\' 6"  (1.676 m)  Weight: 209 lb 6.4 oz (94.983 kg)   EKG is done today and reviewed by me. There is no change from the past. There is normal sinus rhythm. There is decreased anterior R wave progression. There are nonspecific ST-T wave changes.  ASSESSMENT & PLAN

## 2013-12-22 LAB — HIV-1 RNA QUANT-NO REFLEX-BLD
HIV 1 RNA Quant: <20 copies/mL (ref <20)
HIV-1 RNA Quant, Log: <1.3 {Log} (ref <1.30)

## 2013-12-25 ENCOUNTER — Other Ambulatory Visit: Payer: Self-pay | Admitting: Cardiology

## 2013-12-27 ENCOUNTER — Encounter: Payer: Self-pay | Admitting: Family Medicine

## 2013-12-27 NOTE — Progress Notes (Signed)
Patient ID: [name], male   DOB: [birth year], 48 y.o.   MRN: [id number] Reviewed: Agree with our pharmacologist's documentation and management.

## 2013-12-27 NOTE — Progress Notes (Signed)
Patient ID: [name], male   DOB: 1965-11-24, 48 y.o.   MRN: [id number] Reviewed: Agree with our pharmacologist's documentation and management.

## 2014-01-03 ENCOUNTER — Ambulatory Visit: Payer: 59 | Admitting: Infectious Disease

## 2014-01-16 ENCOUNTER — Ambulatory Visit (INDEPENDENT_AMBULATORY_CARE_PROVIDER_SITE_OTHER): Payer: Self-pay | Admitting: Family Medicine

## 2014-01-16 DIAGNOSIS — E119 Type 2 diabetes mellitus without complications: Secondary | ICD-10-CM

## 2014-01-18 NOTE — Progress Notes (Signed)
Patient presents today for 2 month diabetes follow-up as part of the employer-sponsored Link to Wellness program. Current diabetes regimen includes Lantus and Novolog. Patient also continues on daily ASA, ACEi, and statin. Most recent MD follow-up was June 2015, at this time Lantus was divided into two doses vs one large dose. Patient will follow-up with MD this week for labwork and DM review. No med changes or major health changes at this time.  Diabetes Assessment: Type of Diabetes: Type 2; Sees Diabetes provider 2 times per year; MD managing Diabetes Asencion Noble MD; checks feet daily; takes an aspirin a day; What is target A1c? 7.0 %; What are the signs of hyperglycemia? High blood glucose; Diabetes Education Completed DM/Nutrition classes; What are the signs and symptoms of hypoglycemia? Shaky, Sweaty; checks blood glucose 2 times a day Testing less while sick; uses glucometer One Touch Ultra; Year of diagnosis 1999; takes medications as prescribed Pt reports he is "religious" regarding taking his medications.; Lowest CBG Had not eaten enough; hypoglycemia frequency Rare; Highest CBG 251 Other Diabetes History: Current med regimen includes Lantus 85 units nightly and Novolog sliding scale, usually 5-8 units before meals. Patient is on a high dose of Lantus and a low dose of novolog for meal coverage. Patient does maintain good medication compliance. Patient did not bring meter today but is currently testing 2 times per day. He continues using various meters, one at home, one at work, and one for traveling. Glucose monitoring occurs fasting and when symptomatic. Per patient report, glucose is running 160-190s, rarely greater than 200. He reports being very active and eating more healthy foods while in [place] recently and during this time patient's glucose was 150s or less. Hypoglycemia frequency is rare. Patient does demonstrate appropriate correction of hypoglycemia. I have recommended patient test more  often. Patient denies signs and symptoms of neuropathy including numbness/tingling/burning and symptoms of foot infection. Patient is not due for yearly eye exam.  Lifestyle Factors: Diet - Patient reports the following food recall: Breakfast - one boiled egg, one [place] sauasage, skips breakfast at home AM snack - fruit cup Lunch - soup Supper - some of each, cooking at home is often vegetables and sandwiches, out to eat 2-3 times per week [employer], [employer], [employer], [employer], etc. Bedtime snack - none Beverages - diet coke alone, no coffee, limiting to one coke in AM while at work, sometimes unsweet tea Does eat a lot of vegetables, tries to limit starches. Exercise - No routine exercise. Pt attempts to get up every 30-45 minutes at work, makes a lap around the offfice. Was recently on vacation in [place] and walked daily. Patient has a significant cardiac history and has been told by MD to stay out of the heat, so no exercise this summer. I have encouraged pt to increase physical activity and to start with mild activity.  Assessment: Patient presents today for follow-up. Patient has significant room for improvement in diet and exercise, but is limited with regard to exercise due to extensive cardiac history. He is able to do mild exercise and is aware of the need to initiate this soon. Patient will follow-up with MD next week for labwork and medication adjustment. Pt will continue to follow-up every two months until a1c <8.0.  Plan: 1) Continue making healthy dietary choices, eating plenty of vegetables and limiting starchy carbs 2) Attempt to increase exercise, mild walking, etc 3) Follow-up with Dr. Willey Blade this week 4) Continue to stay compliant with medications 5) Follow-up  with [facility] in 2 months on Wednesday Oct 28th @ 1:30 pm

## 2014-01-22 ENCOUNTER — Ambulatory Visit (INDEPENDENT_AMBULATORY_CARE_PROVIDER_SITE_OTHER): Payer: 59 | Admitting: Infectious Disease

## 2014-01-22 ENCOUNTER — Encounter: Payer: Self-pay | Admitting: Infectious Disease

## 2014-01-22 VITALS — BP 143/88 | HR 90 | Temp 98.3°F | Wt 205.0 lb

## 2014-01-22 DIAGNOSIS — E782 Mixed hyperlipidemia: Secondary | ICD-10-CM

## 2014-01-22 DIAGNOSIS — I10 Essential (primary) hypertension: Secondary | ICD-10-CM

## 2014-01-22 DIAGNOSIS — A5274 Syphilis of liver and other viscera: Secondary | ICD-10-CM

## 2014-01-22 DIAGNOSIS — I2510 Atherosclerotic heart disease of native coronary artery without angina pectoris: Secondary | ICD-10-CM

## 2014-01-22 DIAGNOSIS — B20 Human immunodeficiency virus [HIV] disease: Secondary | ICD-10-CM

## 2014-01-22 NOTE — Progress Notes (Signed)
   Patient ID: [name], male    DOB: 12/20/1965, 48 y.o.   MRN: [id number]  HPI   48 year old Caucasian man with HIV perfectly controlled with Atripla since first started on it. He does have comorbid CAD< DM, difficult to control hyperlipidemia, HTN who was taken care of by Bonner Puna MD at [facility]  Retired and pt moved [facility] for care.  We  changed to complera and he is suppressed on this medicine.  Viral load has remained suppressed and CD4 counts are healthy.  Lab Results  Component Value Date   HIV1RNAQUANT <20 12/20/2013    Lab Results  Component Value Date   CD4TABS 1200 12/20/2013   CD4TABS 1380 06/12/2013   CD4TABS 1270 01/09/2013      Review of Systems  Constitutional: Negative for fever, chills, diaphoresis, activity change, appetite change, fatigue and unexpected weight change.  HENT: Negative for congestion, rhinorrhea, sinus pressure, sneezing, sore throat and trouble swallowing.   Eyes: Negative for photophobia and visual disturbance.  Respiratory: Negative for cough, chest tightness, shortness of breath, wheezing and stridor.   Cardiovascular: Negative for chest pain, palpitations and leg swelling.  Gastrointestinal: Negative for nausea, vomiting, abdominal pain, diarrhea, constipation, blood in stool, abdominal distention and anal bleeding.  Genitourinary: Negative for dysuria, hematuria and flank pain.  Musculoskeletal: Negative for arthralgias, back pain, gait problem, joint swelling and myalgias.  Skin: Negative for color change, pallor, rash and wound.  Neurological: Negative for dizziness, tremors, weakness and light-headedness.  Hematological: Negative for adenopathy. Does not bruise/bleed easily.  Psychiatric/Behavioral: Negative for behavioral problems, confusion, sleep disturbance, dysphoric mood, decreased concentration and agitation.       Objective:   Physical Exam  Constitutional: He is oriented to person, place, and time. He appears well-developed  and well-nourished. No distress.  HENT:  Head: Normocephalic and atraumatic.  Mouth/Throat: Oropharynx is clear and moist. No oropharyngeal exudate.  Eyes: Conjunctivae and EOM are normal. Pupils are equal, round, and reactive to light. No scleral icterus.  Neck: Normal range of motion. Neck supple. No JVD present.  Cardiovascular: Normal rate, regular rhythm and normal heart sounds.  Exam reveals no gallop and no friction rub.   No murmur heard. Pulmonary/Chest: Effort normal and breath sounds normal. No respiratory distress. He has no wheezes. He has no rales. He exhibits no tenderness.  Abdominal: He exhibits no distension and no mass. There is no tenderness. There is no rebound and no guarding.  Musculoskeletal: He exhibits no edema and no tenderness.  Lymphadenopathy:    He has no cervical adenopathy.  Neurological: He is alert and oriented to person, place, and time. He has normal reflexes. He exhibits normal muscle tone. Coordination normal.  Skin: Skin is warm and dry. He is not diaphoretic. No erythema. No pallor.  Psychiatric: He has a normal mood and affect. His behavior is normal. Judgment and thought content normal.          Assessment & Plan:  HIV: Continue Complera. I spent 25 minutes 50% of time in face to face counsel of the patient and in coordination of their care.   NLZ:JQBHALP with  Asencion Noble  Hyperlipidemia: Triglycerides were completely out of whack when checked but he had not been fasting prior to lab collection apparently traversed it looked much better when checked by PCP  CAD: on beta blocker, acei, statin, asa, plavix dc'd  Syphliitic hepatitis: resolved

## 2014-04-11 ENCOUNTER — Encounter: Payer: Self-pay | Admitting: Family Medicine

## 2014-04-11 NOTE — Progress Notes (Signed)
Patient ID: [name], male   DOB: 1965-08-26, 48 y.o.   MRN: [id number] Reviewed: Agree with the documentation and management of our [place].

## 2014-07-23 ENCOUNTER — Other Ambulatory Visit: Payer: Self-pay | Admitting: Infectious Disease

## 2014-07-23 ENCOUNTER — Other Ambulatory Visit: Payer: Self-pay | Admitting: Cardiology

## 2014-07-23 DIAGNOSIS — B20 Human immunodeficiency virus [HIV] disease: Secondary | ICD-10-CM

## 2014-09-13 ENCOUNTER — Other Ambulatory Visit: Payer: 59

## 2014-09-13 DIAGNOSIS — B20 Human immunodeficiency virus [HIV] disease: Secondary | ICD-10-CM

## 2014-09-13 LAB — LIPID PANEL
Cholesterol: 147 mg/dL (ref 0–200)
HDL: 29 mg/dL — ABNORMAL LOW (ref >=40)
Total CHOL/HDL Ratio: 5.1 Ratio
Triglycerides: 422 mg/dL — ABNORMAL HIGH (ref <150)

## 2014-09-13 LAB — COMPLETE METABOLIC PANEL WITH GFR
ALT: 57 U/L — ABNORMAL HIGH (ref 0–53)
AST: 41 U/L — ABNORMAL HIGH (ref 0–37)
Albumin: 3.8 g/dL (ref 3.5–5.2)
Alkaline Phosphatase: 55 U/L (ref 39–117)
BUN: 20 mg/dL (ref 6–23)
CO2: 25 mEq/L (ref 19–32)
Calcium: 9.4 mg/dL (ref 8.4–10.5)
Chloride: 97 mEq/L (ref 96–112)
Creat: 0.98 mg/dL (ref 0.50–1.35)
GFR, Est African American: >89 mL/min
GFR, Est Non African American: >89 mL/min
Glucose, Bld: 395 mg/dL — ABNORMAL HIGH (ref 70–99)
Potassium: 4.8 mEq/L (ref 3.5–5.3)
Sodium: 136 mEq/L (ref 135–145)
Total Bilirubin: 0.5 mg/dL (ref 0.2–1.2)
Total Protein: 6.2 g/dL (ref 6.0–8.3)

## 2014-09-13 LAB — CBC WITH DIFFERENTIAL/PLATELET
Basophils Absolute: 0 10*3/uL (ref 0.0–0.1)
Basophils Relative: 0 % (ref 0–1)
Eosinophils Absolute: 0.2 10*3/uL (ref 0.0–0.7)
Eosinophils Relative: 3 % (ref 0–5)
HCT: 45.2 % (ref 39.0–52.0)
Hemoglobin: 15.3 g/dL (ref 13.0–17.0)
Lymphocytes Relative: 41 % (ref 12–46)
Lymphs Abs: 3.2 10*3/uL (ref 0.7–4.0)
MCH: 29.3 pg (ref 26.0–34.0)
MCHC: 33.8 g/dL (ref 30.0–36.0)
MCV: 86.4 fL (ref 78.0–100.0)
MPV: 9 fL (ref 8.6–12.4)
Monocytes Absolute: 0.6 10*3/uL (ref 0.1–1.0)
Monocytes Relative: 8 % (ref 3–12)
Neutro Abs: 3.8 10*3/uL (ref 1.7–7.7)
Neutrophils Relative %: 48 % (ref 43–77)
Platelets: 264 10*3/uL (ref 150–400)
RBC: 5.23 MIL/uL (ref 4.22–5.81)
RDW: 13.9 % (ref 11.5–15.5)
WBC: 7.9 10*3/uL (ref 4.0–10.5)

## 2014-09-14 LAB — HIV-1 RNA QUANT-NO REFLEX-BLD
HIV 1 RNA Quant: <20 copies/mL (ref <20)
HIV-1 RNA Quant, Log: <1.3 {Log} (ref <1.30)

## 2014-09-14 LAB — T-HELPER CELL (CD4) - (RCID CLINIC ONLY)
CD4 % Helper T Cell: 46 % (ref 33–55)
CD4 T Cell Abs: 1470 /uL (ref 400–2700)

## 2014-09-14 LAB — URINE CYTOLOGY ANCILLARY ONLY
Chlamydia: NEGATIVE
Neisseria Gonorrhea: NEGATIVE

## 2014-09-14 LAB — RPR

## 2014-09-14 LAB — HEPATITIS C ANTIBODY: HCV Ab: NEGATIVE

## 2014-09-19 ENCOUNTER — Ambulatory Visit (INDEPENDENT_AMBULATORY_CARE_PROVIDER_SITE_OTHER): Payer: Self-pay | Admitting: Family Medicine

## 2014-09-19 ENCOUNTER — Ambulatory Visit: Payer: 59 | Admitting: Pharmacist

## 2014-09-19 VITALS — BP 142/72 | Ht 66.0 in | Wt 206.0 lb

## 2014-09-19 DIAGNOSIS — E119 Type 2 diabetes mellitus without complications: Secondary | ICD-10-CM

## 2014-09-19 NOTE — Progress Notes (Signed)
Diet - protein shakes twice daily with a meal, 3 servings of veg per day.  Portion control better, take out when eating out.  Limiting sweets.  Limited starches including pasta, potatoes, rice, breads.    Wt loss goal of less than 200 lbs by next visit.     Subjective:  Patient presents today for 3 month diabetes follow-up as part of the employer-sponsored Link to Wellness program.  Current diabetes regimen includes Novolog and Lantus.  Patient also continues on daily ASA, ACE Inhibitor and statin.  Most recent MD follow-up was Sept 2015.  Patient has a pending appt for May 2016.  He normally has a follow-up scheduled for every 4 months.  No med changes or major health changes at this time.    Assessment/Plan:  Patient is a 49yo male with DM type 2. Most recent A1C was 8.5% which is exceeding goal of less than 7%.  Weight has increased by 6 lb since last visit.  Patient reports compliant with medications but refill history shows some discrepancy.  Continues using OneTouch meter and tests 2-3 times daily.  Patient did not bring meter today but reports fasting glucose of 140-160.  He knows this is elevated but reports his MD is happy with fastings at 150 or less.  Up to date on eye and dental exams.  Patient denies symptoms of neuropathy or infection.    Lifestyle:  Physical Activity-  Walking a trail for 45-60 minutes three days per week.   Nutrition-  No significant changes.  Patient reports he is trying to better manage portion sizes and limit starchy carbs.  He has also added a protein shake with some of his meals.  Identifies unhealthy snacking as the area in need of most improvement at this time.  His favorite snacks include pork rinds and doritos.  He does not cover snacks with insulin.    Goals for Next Visit:  1. 1)  Continue exercising at least three days per week, great job with this! 2)  Limit portions of snacks and dose insulin for high carb snacks 3)  Weight loss goal - aim for  less than 200 lb at next visit 3)  Continue testing at least twice daily 4)  Follow-up in 3 months on Wednesday July 27th @ 2:00 pm   Tilman Neat, PharmD Link to [place]

## 2014-09-19 NOTE — Patient Instructions (Addendum)
1)  Continue exercising at least three days per week, great job with this! 2)  Limit portions of snacks and dose insulin for high carb snacks 3)  Weight loss goal - aim for less than 200 lb at next visit 3)  Continue testing at least twice daily 4)  Follow-up in 3 months on Wednesday July 27th @ 2:00 pm  Great to see you today!

## 2014-09-25 NOTE — Progress Notes (Signed)
Patient ID: [name], male   DOB: 04/20/1966, 49 y.o.   MRN: [id number] ATTENDING PHYSICIAN NOTE: I have reviewed the chart and agree with the plan as detailed above. Dorcas Mcmurray MD Pager [phone]

## 2014-09-27 ENCOUNTER — Ambulatory Visit (INDEPENDENT_AMBULATORY_CARE_PROVIDER_SITE_OTHER): Payer: 59 | Admitting: Infectious Disease

## 2014-09-27 ENCOUNTER — Encounter: Payer: Self-pay | Admitting: Infectious Disease

## 2014-09-27 VITALS — BP 138/84 | HR 96 | Temp 98.1°F | Wt 210.0 lb

## 2014-09-27 DIAGNOSIS — B20 Human immunodeficiency virus [HIV] disease: Secondary | ICD-10-CM | POA: Diagnosis not present

## 2014-09-27 DIAGNOSIS — R918 Other nonspecific abnormal finding of lung field: Secondary | ICD-10-CM | POA: Insufficient documentation

## 2014-09-27 DIAGNOSIS — A5149 Other secondary syphilitic conditions: Secondary | ICD-10-CM | POA: Diagnosis not present

## 2014-09-27 DIAGNOSIS — E1165 Type 2 diabetes mellitus with hyperglycemia: Secondary | ICD-10-CM

## 2014-09-27 DIAGNOSIS — E785 Hyperlipidemia, unspecified: Secondary | ICD-10-CM

## 2014-09-27 DIAGNOSIS — E1159 Type 2 diabetes mellitus with other circulatory complications: Secondary | ICD-10-CM | POA: Insufficient documentation

## 2014-09-27 DIAGNOSIS — Z794 Long term (current) use of insulin: Secondary | ICD-10-CM

## 2014-09-27 DIAGNOSIS — I2510 Atherosclerotic heart disease of native coronary artery without angina pectoris: Secondary | ICD-10-CM | POA: Diagnosis not present

## 2014-09-27 DIAGNOSIS — IMO0002 Reserved for concepts with insufficient information to code with codable children: Secondary | ICD-10-CM

## 2014-09-27 DIAGNOSIS — E119 Type 2 diabetes mellitus without complications: Secondary | ICD-10-CM | POA: Insufficient documentation

## 2014-09-27 DIAGNOSIS — I10 Essential (primary) hypertension: Secondary | ICD-10-CM | POA: Diagnosis not present

## 2014-09-27 HISTORY — DX: Type 2 diabetes mellitus with hyperglycemia: E11.65

## 2014-09-27 MED ORDER — EMTRICITAB-RILPIVIR-TENOFOV AF 200-25-25 MG PO TABS: 1.0000 | ORAL_TABLET | Freq: Every day | ORAL | Status: DC

## 2014-09-27 NOTE — Progress Notes (Signed)
   Patient ID: [name], male    DOB: [birth year], 48 y.o.   MRN: [id number]  HPI   49 year old Caucasian man with HIV perfectly controlled with Atripla since first started on it. He does have comorbid CAD, DM, difficult to control hyperlipidemia, HTN who was taken care of by Bonner Puna MD at [facility]  Retired and pt moved [facility] for care.  We  changed to complera and he is suppressed on this medicine.  Viral load has remained suppressed and CD4 counts are healthy. We discussed ODEFSEY today for better kidney and bone safety profile.  Lab Results  Component Value Date   HIV1RNAQUANT <20 09/13/2014    Lab Results  Component Value Date   CD4TABS 1470 09/13/2014   CD4TABS 1200 12/20/2013   CD4TABS 1380 06/12/2013      Review of Systems  Constitutional: Negative for fever, chills, diaphoresis, activity change, appetite change, fatigue and unexpected weight change.  HENT: Negative for congestion, rhinorrhea, sinus pressure, sneezing, sore throat and trouble swallowing.   Eyes: Negative for photophobia and visual disturbance.  Respiratory: Negative for cough, chest tightness, shortness of breath, wheezing and stridor.   Cardiovascular: Negative for chest pain, palpitations and leg swelling.  Gastrointestinal: Negative for nausea, vomiting, abdominal pain, diarrhea, constipation, blood in stool, abdominal distention and anal bleeding.  Genitourinary: Negative for dysuria, hematuria and flank pain.  Musculoskeletal: Negative for myalgias, back pain, joint swelling, arthralgias and gait problem.  Skin: Negative for color change, pallor, rash and wound.  Neurological: Negative for dizziness, tremors, weakness and light-headedness.  Hematological: Negative for adenopathy. Does not bruise/bleed easily.  Psychiatric/Behavioral: Negative for behavioral problems, confusion, sleep disturbance, dysphoric mood, decreased concentration and agitation.       Objective:   Physical Exam   Constitutional: He is oriented to person, place, and time. He appears well-developed and well-nourished. No distress.  HENT:  Head: Normocephalic and atraumatic.  Mouth/Throat: Oropharynx is clear and moist. No oropharyngeal exudate.  Eyes: Conjunctivae and EOM are normal. Pupils are equal, round, and reactive to light. No scleral icterus.  Neck: Normal range of motion. Neck supple. No JVD present.  Cardiovascular: Normal rate, regular rhythm and normal heart sounds.  Exam reveals no gallop and no friction rub.   No murmur heard. Pulmonary/Chest: Effort normal and breath sounds normal. No respiratory distress. He has no wheezes. He has no rales. He exhibits no tenderness.  Abdominal: He exhibits no distension and no mass. There is no tenderness. There is no rebound and no guarding.  Musculoskeletal: He exhibits no edema or tenderness.  Lymphadenopathy:    He has no cervical adenopathy.  Neurological: He is alert and oriented to person, place, and time. He has normal reflexes. He exhibits normal muscle tone. Coordination normal.  Skin: Skin is warm and dry. He is not diaphoretic. No erythema. No pallor.  Psychiatric: He has a normal mood and affect. His behavior is normal. Judgment and thought content normal.          Assessment & Plan:  HIV: switch to ODEFSEY  HTN: follows with  Asencion Noble  Hyperlipidemia: Triglycerides have come down since last measured   CAD: on beta blocker, acei, statin, asa,  Syphliitic hepatitis: resolved  Obesity: followuop with PCP  Diabetes mellitus with high BG on most recent blood work follow-up with PCP had not seen a recent A1c

## 2014-12-19 ENCOUNTER — Ambulatory Visit: Payer: 59 | Admitting: Pharmacist

## 2014-12-19 ENCOUNTER — Ambulatory Visit (INDEPENDENT_AMBULATORY_CARE_PROVIDER_SITE_OTHER): Payer: Self-pay | Admitting: Family Medicine

## 2014-12-19 VITALS — BP 124/76 | Ht 66.0 in | Wt 210.0 lb

## 2014-12-19 DIAGNOSIS — E118 Type 2 diabetes mellitus with unspecified complications: Secondary | ICD-10-CM

## 2014-12-19 NOTE — Patient Instructions (Addendum)
1)  Continue exercising at least three days per week, great job with this! 2)  Limit portions of snacks and dose insulin for high carb snacks 3)  Weight loss goal - aim for less than 200 lb at next visit 3)  Continue testing at least twice daily 4)  Follow-up in 3 months on Wednesday October 26th @ 10:00 am   Great to see you today!

## 2014-12-19 NOTE — Progress Notes (Signed)
  A1c in May was >8% but was improved from last visit of 8.5%.   Subjective:  Patient presents today for 3 month diabetes follow-up as part of the employer-sponsored Link to Wellness program.  Current diabetes regimen includes Novolog and Lantus.  Patient also continues on daily ASA, ACE Inhibitor and statin.  Most recent MD follow-up was May 2016.  Patient has a pending appt for August 2016.  He normally has a follow-up scheduled for every 4 months.  No major health changes at this time. Patient's HIV regimen was changed from Complera to a new med, Odefsey, and is tolerating well.   Assessment/Plan:  Meal coverage insulin has increased slightly due to elevated glucose readings.  Patient is now using up to 10 units per meal.  Most recent A1C was improved at 8.3% (prev 8.5%) but still exceeding goal of less than 7%.  Weight not changed since last visit.  Patient reports compliance with medications but refill history shows some discrepancy.  Is switching to True Metrix meter today for cost savings.  Patient did not bring meter today but reports testing 1-2 times daily with fasting glucose of 140-160.  He knows this is elevated but reports his MD is happy with fastings at 150 or less.  Up to date on eye and dental exams.  Patient denies symptoms of neuropathy or infection.    Lifestyle:  Physical Activity-  No routine exercise.  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   Nutrition-  Patient has reduced snacking and is eating less chips and doritos, etc.  Eating more fruit for snacks now.  Patient reports he is trying to better manage portion sizes and limit starchy carbs.  He continues not covering snacks with insulin and we have discussed this again at this visit.    Goals for Next Visit:  1)  Resume exercising at least three days per week, great job with this! 2)  Limit portions of snacks and dose insulin for high carb snacks 3)  Weight loss goal - aim for less than 200 lb at  next visit 3)  Continue testing at least twice daily 4)  Follow-up in 3 months on 03/20/15 at 10:00 am   Tilman Neat, PharmD Link to [place]  [phone]

## 2015-01-07 NOTE — Progress Notes (Signed)
Patient ID: [name], male   DOB: 10-28-1965, 49 y.o.   MRN: [id number] ATTENDING PHYSICIAN NOTE: I have reviewed the chart and agree with the plan as detailed above. Dorcas Mcmurray MD Pager [phone]

## 2015-03-20 ENCOUNTER — Ambulatory Visit (INDEPENDENT_AMBULATORY_CARE_PROVIDER_SITE_OTHER): Payer: Self-pay | Admitting: Family Medicine

## 2015-03-20 ENCOUNTER — Encounter: Payer: Self-pay | Admitting: Pharmacist

## 2015-03-20 VITALS — BP 130/76 | Wt 206.0 lb

## 2015-03-20 DIAGNOSIS — E118 Type 2 diabetes mellitus with unspecified complications: Secondary | ICD-10-CM

## 2015-03-20 DIAGNOSIS — Z794 Long term (current) use of insulin: Secondary | ICD-10-CM

## 2015-03-20 NOTE — Patient Instructions (Signed)
1)  Continue exercising at least three days per week, great job with this! 2)  Maintain dietary improvements, limiting snack foods, continue to manage portion control, especially throughout the holidays 3)  Increase testing, attempt to test at least once during the work day.  4)  Attempt to maintain weight loss, avoid weight gain during the holidays 5)  Follow-up in 3 months on Wednesday Feb 1st @ 2:00 pm  Great to see you today!

## 2015-03-20 NOTE — Progress Notes (Signed)
Subjective:  Patient presents today for 3 month diabetes follow-up as part of the employer-sponsored Link to Wellness program.  Current diabetes regimen includes Novolog and Lantus.  Patient also continues on daily ASA, ACE Inhibitor and statin.  Most recent MD follow-up was August 2016.  Patient has a pending appt for Dec 2016.  He normally has a follow-up scheduled for every 4 months.  No major health changes at this time. Patient's HIV regimen was changed from Complera to a new med, Odefsey, and he continues tolerating well.  Of note, patient recently started using Trazodone for insomnia.  He reports using this prn and often notices tingling in his feet approx 30 min after taking a dose.  It resolves quickly with no lingering affects the next day.  I have asked him to continue to monitor for this.     Assessment/Plan:     Insulin adjustments were made at most recent office visit due to elevated A1c.  Lantus dose was split from 90 units daily to 45 units twice daily for better coverage.  Humalog continues to be sliding scale, pt uses 8 units for glucose 150 or less, and adds additional units if glucose >150 or if eating high starch meal.  Sliding scale is very vague.  Most recent A1C was >9% per patient (prev 8.3%) and continues to exceed goal of less than 7%.  Weight had decreased by 5 lbs since last visit, but patient reports this was due to recent GI illness in which he did not eat normally for several days.   Patient reports compliance with medications but refill history shows some discrepancy. Patient has been given a second True Metrix meter today for use at work.  He rarely tests at work and feels this would help improve compliance to have a meter at both work and home.  Patient did not bring meter today but reports testing 1-2 times daily with fasting glucose of 140-160.  Glucose was variable during GI illness, patient continued using lantus at regular dosing and also used some humalog as a  correction dose for hyperglycemia during this time.  Up to date on eye and dental exams.  Patient denies symptoms of neuropathy or infection.     Lifestyle:  Physical Activity-  Very little routine exercise.  He is now walking dogs three times weekly, 20-25 minutes each time.  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    Nutrition-  Patient has made some efforts to improve diet, but continues to need work in this area.  Patient has reduced snacking and is eating less chips and doritos, etc, and purchasing fewer of these types of foods at the grocery store.  Patient reports he is trying to better manage portion sizes and limit starchy carbs, especially when eating out, as he eats out for supper several times per week.  We have discussed maintaining diet over the holidays.     Goals for Next Visit:  1)  Continue exercising at least three days per week, great job with this! 2)  Maintain dietary improvements, limiting snack foods, continue to manage portion control, especially throughout the holidays 3)  Increase testing, attempt to test at least once during the work day.  4)  Attempt to maintain weight loss, avoid weight gain during the holidays 5)  Follow-up in 3 months on Wednesday Feb 1st @ 2:00 pm  Great to see you today!   Tilman Neat, PharmD Link to [facility]  [place]

## 2015-03-25 NOTE — Progress Notes (Signed)
I have reviewed this pharmacist's note  

## 2015-06-26 ENCOUNTER — Ambulatory Visit: Payer: 59 | Admitting: Pharmacist

## 2015-07-03 MED FILL — METOPROLOL SUCC ER 100 MG T: 100 | 90 days supply | Qty: 90 | Fill #2

## 2015-07-03 MED FILL — TRUEplus LANCETS 30G MISC: 90 days supply | Qty: 200 | Fill #1

## 2015-07-03 MED FILL — LISINOPRIL 10 MG TABLET: 10 | 90 days supply | Qty: 90 | Fill #3

## 2015-07-03 MED FILL — ODEFSEY 200-25-25 MG TABS: 200-25-25 | 90 days supply | Qty: 90 | Fill #3

## 2015-07-03 MED FILL — LANTUS SOLOSTAR 100 UNITS/M: 100 | 87 days supply | Qty: 105 | Fill #0

## 2015-07-03 MED FILL — NOVOLOG FLEXPEN SYRINGE: 100 | 90 days supply | Qty: 60 | Fill #0

## 2015-07-03 MED FILL — ROSUVASTATIN CALCIUM 40 MG: 40 | 90 days supply | Qty: 90 | Fill #3

## 2015-07-03 MED FILL — CHLORTHALIDONE 25 MG TABLET: 25 | 90 days supply | Qty: 90 | Fill #3

## 2015-07-03 MED FILL — TRUE METRIX GLUCOSE TEST ST: 90 days supply | Qty: 200 | Fill #1

## 2015-07-09 DIAGNOSIS — H5213 Myopia, bilateral: Secondary | ICD-10-CM | POA: Diagnosis not present

## 2015-07-09 DIAGNOSIS — H52223 Regular astigmatism, bilateral: Secondary | ICD-10-CM | POA: Diagnosis not present

## 2015-07-09 DIAGNOSIS — H524 Presbyopia: Secondary | ICD-10-CM | POA: Diagnosis not present

## 2015-09-30 ENCOUNTER — Other Ambulatory Visit: Payer: Self-pay | Admitting: Infectious Disease

## 2015-09-30 DIAGNOSIS — B20 Human immunodeficiency virus [HIV] disease: Secondary | ICD-10-CM

## 2015-10-08 ENCOUNTER — Encounter: Payer: Self-pay | Admitting: Cardiology

## 2015-10-10 ENCOUNTER — Other Ambulatory Visit: Payer: Self-pay | Admitting: Cardiology

## 2015-10-10 MED FILL — LANTUS SOLOSTAR 100 UNITS/M: 100 | 87 days supply | Qty: 105 | Fill #1

## 2015-10-10 MED FILL — METOPROLOL SUCC ER 100 MG T: 100 | 90 days supply | Qty: 90 | Fill #0

## 2015-10-10 MED FILL — NOVOLOG FLEXPEN SYRINGE: 100 | 90 days supply | Qty: 60 | Fill #1

## 2015-10-10 MED FILL — TRUE METRIX GLUCOSE TEST ST: 90 days supply | Qty: 200 | Fill #2

## 2015-10-10 MED FILL — TRUEplus LANCETS 30G MISC: 90 days supply | Qty: 200 | Fill #2

## 2015-10-14 ENCOUNTER — Ambulatory Visit (INDEPENDENT_AMBULATORY_CARE_PROVIDER_SITE_OTHER): Payer: 59 | Admitting: Cardiology

## 2015-10-14 ENCOUNTER — Encounter: Payer: Self-pay | Admitting: Cardiology

## 2015-10-14 VITALS — BP 122/68 | HR 71 | Ht 66.0 in | Wt 200.0 lb

## 2015-10-14 DIAGNOSIS — E785 Hyperlipidemia, unspecified: Secondary | ICD-10-CM | POA: Diagnosis not present

## 2015-10-14 DIAGNOSIS — E7800 Pure hypercholesterolemia, unspecified: Secondary | ICD-10-CM

## 2015-10-14 DIAGNOSIS — I10 Essential (primary) hypertension: Secondary | ICD-10-CM

## 2015-10-14 DIAGNOSIS — I2510 Atherosclerotic heart disease of native coronary artery without angina pectoris: Secondary | ICD-10-CM

## 2015-10-14 MED ORDER — OMEGA-3-ACID ETHYL ESTERS 1 G PO CAPS: 2.0000 g | ORAL_CAPSULE | Freq: Two times a day (BID) | ORAL | Status: DC

## 2015-10-14 MED FILL — OMEGA-3 ETHYL ESTERS 1 GM C: 1 | 90 days supply | Qty: 360 | Fill #0

## 2015-10-14 NOTE — Progress Notes (Signed)
Cardiology Office Note    Date:  10/14/2015   ID:  Christi, [name], [name], MRN [id number]  PCP:  Asencion Noble, MD  Cardiologist: Dr Ron [name] --> Ena Dawley, MD   Chief complaint: Follow-up for CAD  History of Present Illness:  [name] is a 50 y.o. male with prior medical history of HIV on antiretroviral therapy, CAD status post 2 stent placements in 2009, hypertension, poorly controlled hyperlipidemia with hereditary hypertriglyceridemia, who was previously followed by Dr. Ron [name] and is coming for follow-up after 2 years. The patient states that he has been doing her well he experiences no chest pain or shortness of breath no claudications palpitations or syncope is no sign of lower extremity edema or paroxysmal nocturnal dyspnea. He is compliant with his meds and follow with his ID doctor for his HIV. His medication has been changed from a Atripla to [facility].  Past Medical History  Diagnosis Date  . Diabetes mellitus type II   . HTN (hypertension)   . HIV (human immunodeficiency virus infection) ([place])   . 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  . 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 CAD (coronary artery disease)     DES.  /  ... Later (10/2007) intervention for in-stent restenosis ;  EF 60%... catheterization... June, 2009  . Sinus tachycardia (HCC)     mild at rest.... October, 2010  . Kidney calculi   . Ejection fraction     EF 60%, catheterization, 2009  . Dyslipidemia   . Syphilitic hepatitis   . Kidney stone   . Diabetes mellitus type 2, uncontrolled ([place]) 09/27/2014    Past Surgical History  Procedure Laterality Date  . Cholecystectomy    . Coronary stent placement      Current Medications: Outpatient Prescriptions Prior to Visit  Medication Sig Dispense Refill  . aspirin 81 MG tablet Take 81 mg by mouth daily.      .  chlorthalidone (HYGROTON) 25 MG tablet TAKE 1 TABLET BY MOUTH ONCE DAILY 30 tablet 0  . insulin aspart (NOVOLOG FLEXPEN) 100 UNIT/ML injection Inject 8 Units into the skin 3 (three) times daily before meals. Base dose is 8 units, adjust with 1 unit increments based on  Blood Glucose levels    . insulin glargine (LANTUS SOLOSTAR) 100 UNIT/ML injection Inject 40 Units into the skin 2 (two) times daily.     [name] lisinopril (PRINIVIL,ZESTRIL) 10 MG tablet TAKE 1 TABLET BY MOUTH ONCE DAILY 30 tablet 0  . metoprolol (TOPROL-XL) 100 MG 24 hr tablet Take 1 tablet (100 mg total) by mouth daily. 90 tablet 3  . ODEFSEY 200-25-25 MG TABS tablet TAKE 1 TABLET BY MOUTH DAILY WITH SUPPER. 30 tablet 5  . rosuvastatin (CRESTOR) 40 MG tablet TAKE 1 TABLET BY MOUTH DAILY. 30 tablet 0  . traZODone (DESYREL) 50 MG tablet Take 50 mg by mouth at bedtime.     No facility-administered medications prior to visit.     Allergies:   Review of patient's allergies indicates no known allergies.   Social History   Social History  . Marital Status: Single    Spouse Name: N/A  . Number of Children: N/A  . Years of Education: N/A   Social History Main Topics  . Smoking status: Never Smoker   . Smokeless tobacco: None  . Alcohol Use: 0.0 oz/week  0-1 drink(s) per week  . Drug Use: No  . Sexual Activity: Not Asked   Other Topics Concern  . None   Social History Narrative     Family History:  The patient's family history not significant for CAD or CHF, mother had a stroke. Significant hypertriglyceridemia.  ROS:   Please see the history of present illness.    ROS All other systems reviewed and are negative.   PHYSICAL EXAM:   VS:  BP 122/68 mmHg  Pulse 71  Ht 5\' 6"  (1.676 m)  Wt 200 lb (90.719 kg)  BMI 32.30 kg/m2   GEN: Well nourished, well developed, in no acute distress HEENT: normal Neck: no JVD, carotid bruits, or masses Cardiac: RRR; no murmurs, rubs, or gallops,no edema  Respiratory:  clear to  auscultation bilaterally, normal work of breathing GI: soft, nontender, nondistended, + BS MS: no deformity or atrophy Skin: warm and dry, no rash Neuro:  Alert and Oriented x 3, Strength and sensation are intact Psych: euthymic mood, full affect  Wt Readings from Last 3 Encounters:  10/14/15 200 lb (90.719 kg)  03/20/15 206 lb (93.441 kg)  12/19/14 210 lb (95.255 kg)      Studies/Labs Reviewed:   EKG:  EKG is ordered today.  The ekg ordered today demonstrates Sinus rhythm, normal EKG unchanged from prior.  Recent Labs: No results found for requested labs within last 365 days.   Lipid Panel    Component Value Date/Time   CHOL 147 09/13/2014 1409   TRIG 422* 09/13/2014 1409   HDL 29* 09/13/2014 1409   CHOLHDL 5.1 09/13/2014 1409   VLDL NOT CALC 09/13/2014 1409   LDLCALC NOT CALC 09/13/2014 1409    Additional studies/ records that were reviewed today include:   TTE: 06/2011  - Left ventricle: The cavity size was normal. Wall thickness was normal. Systolic function was normal. The estimated ejection fraction was in the range of 55% to 60%. Wall motion was normal; there were no regional wall motion abnormalities. Features are consistent with a pseudonormal left ventricular filling pattern, with concomitant abnormal relaxation and increased filling pressure (grade 2 diastolic dysfunction). - Atrial septum: No defect or patent foramen ovale was identified.   ASSESSMENT:    1. Coronary artery disease involving native coronary artery of native heart without angina pectoris   2. CAD, multiple vessel   3. Essential hypertension   4. Hypercholesterolemia   5. Dyslipidemia      PLAN:  In order of problems listed above:  1. CAD, status post DES to LAD in 2009, patient is completely asymptomatic and no ischemic workup is necessary at this point. His EKG is completely normal. We will continue aspirin, lisinopril, metoprolol and Crestor. Lipid management as  below.  2. Hyperlipidemia specifically hereditary hypertriglyceridemia - he is already on maximum dose of Crestor, with mildly elevated LFTs, we will add lovastatin 2 g. His primary care physician is checking his labs and we will do it next month will follow.  3. Essential hypertension - well controlled we'll continue the same management.   Medication Adjustments/Labs and Tests Ordered: Current medicines are reviewed at length with the patient today.  Concerns regarding medicines are outlined above.  Medication changes, Labs and Tests ordered today are listed in the Patient Instructions below. Patient Instructions  Medication Instructions:   TAKE LOVAZA 4 GRAMS DAILY---YOU WILL NEED TO TAKE THE 2 GRAM TABLETS--PLEASE FOLLOW THE INSTRUCTIONS ON THE BOTTLE CAREFULLY      Follow-Up:  Your  physician wants you to follow-up in: [place] will receive a reminder letter in the mail two months in advance. If you don't receive a letter, please call our office to schedule the follow-up appointment.       If you need a refill on your cardiac medications before your next appointment, please call your pharmacy.       Signed, Ena Dawley, MD  10/14/2015 11:03 AM    [address], [place], [place]  [address] Phone: [phone]; Fax: [phone]

## 2015-10-14 NOTE — Patient Instructions (Addendum)
Medication Instructions:   TAKE LOVAZA 4 GRAMS DAILY---YOU WILL NEED TO TAKE THE 2 GRAM TABLETS--PLEASE FOLLOW THE INSTRUCTIONS ON THE BOTTLE CAREFULLY      Follow-Up:  Your physician wants you to follow-up in: [place] will receive a reminder letter in the mail two months in advance. If you don't receive a letter, please call our office to schedule the follow-up appointment.       If you need a refill on your cardiac medications before your next appointment, please call your pharmacy.

## 2015-10-16 ENCOUNTER — Other Ambulatory Visit: Payer: Self-pay | Admitting: *Deleted

## 2015-10-16 MED ORDER — ROSUVASTATIN CALCIUM 40 MG PO TABS: 40.0000 mg | ORAL_TABLET | Freq: Every day | ORAL | Status: DC

## 2015-10-16 MED ORDER — LISINOPRIL 10 MG PO TABS: 10.0000 mg | ORAL_TABLET | Freq: Every day | ORAL | Status: DC

## 2015-10-16 MED ORDER — CHLORTHALIDONE 25 MG PO TABS: 25.0000 mg | ORAL_TABLET | Freq: Every day | ORAL | Status: DC

## 2015-10-16 MED FILL — LISINOPRIL 10 MG TABLET: 10 | 90 days supply | Qty: 90 | Fill #0

## 2015-10-16 MED FILL — CHLORTHALIDONE 25 MG TABLET: 25 | 90 days supply | Qty: 90 | Fill #0

## 2015-10-16 MED FILL — ROSUVASTATIN CALCIUM 40 MG: 40 | 90 days supply | Qty: 90 | Fill #0

## 2015-10-16 NOTE — Telephone Encounter (Signed)
Patient requesting refill, but there is no recent lipid panel in epic. Please advise. Thanks, MI

## 2015-10-28 ENCOUNTER — Ambulatory Visit (INDEPENDENT_AMBULATORY_CARE_PROVIDER_SITE_OTHER): Payer: 59 | Admitting: Infectious Disease

## 2015-10-28 ENCOUNTER — Encounter: Payer: Self-pay | Admitting: Infectious Disease

## 2015-10-28 VITALS — BP 126/77 | HR 74 | Temp 98.4°F | Ht 66.0 in | Wt 200.0 lb

## 2015-10-28 DIAGNOSIS — I2510 Atherosclerotic heart disease of native coronary artery without angina pectoris: Secondary | ICD-10-CM

## 2015-10-28 DIAGNOSIS — A5149 Other secondary syphilitic conditions: Secondary | ICD-10-CM

## 2015-10-28 DIAGNOSIS — B20 Human immunodeficiency virus [HIV] disease: Secondary | ICD-10-CM | POA: Diagnosis not present

## 2015-10-28 LAB — CBC WITH DIFFERENTIAL/PLATELET
Basophils Absolute: 0 cells/uL (ref 0–200)
Basophils Relative: 0 %
Eosinophils Absolute: 140 cells/uL (ref 15–500)
Eosinophils Relative: 2 %
HCT: 44.1 % (ref 38.5–50.0)
Hemoglobin: 15.1 g/dL (ref 13.2–17.1)
Lymphocytes Relative: 37 %
Lymphs Abs: 2590 cells/uL (ref 850–3900)
MCH: 28.7 pg (ref 27.0–33.0)
MCHC: 34.2 g/dL (ref 32.0–36.0)
MCV: 83.7 fL (ref 80.0–100.0)
MPV: 8.8 fL (ref 7.5–12.5)
Monocytes Absolute: 630 cells/uL (ref 200–950)
Monocytes Relative: 9 %
Neutro Abs: 3640 cells/uL (ref 1500–7800)
Neutrophils Relative %: 52 %
Platelets: 260 10*3/uL (ref 140–400)
RBC: 5.27 MIL/uL (ref 4.20–5.80)
RDW: 13.7 % (ref 11.0–15.0)
WBC: 7 10*3/uL (ref 3.8–10.8)

## 2015-10-28 LAB — COMPLETE METABOLIC PANEL WITH GFR
ALT: 36 U/L (ref 9–46)
AST: 33 U/L (ref 10–35)
Albumin: 3.8 g/dL (ref 3.6–5.1)
Alkaline Phosphatase: 48 U/L (ref 40–115)
BUN: 16 mg/dL (ref 7–25)
CO2: 24 mmol/L (ref 20–31)
Calcium: 8.7 mg/dL (ref 8.6–10.3)
Chloride: 101 mmol/L (ref 98–110)
Creat: 0.7 mg/dL (ref 0.70–1.33)
GFR, Est African American: >89 mL/min (ref >=60)
GFR, Est Non African American: >89 mL/min (ref >=60)
Glucose, Bld: 131 mg/dL — ABNORMAL HIGH (ref 65–99)
Potassium: 3.7 mmol/L (ref 3.5–5.3)
Sodium: 139 mmol/L (ref 135–146)
Total Bilirubin: 0.5 mg/dL (ref 0.2–1.2)
Total Protein: 6 g/dL — ABNORMAL LOW (ref 6.1–8.1)

## 2015-10-28 LAB — LIPID PANEL
Cholesterol: 141 mg/dL (ref 125–200)
HDL: 40 mg/dL (ref >=40)
LDL Cholesterol: 42 mg/dL (ref <130)
Total CHOL/HDL Ratio: 3.5 Ratio (ref <=5.0)
Triglycerides: 295 mg/dL — ABNORMAL HIGH (ref <150)
VLDL: 59 mg/dL — ABNORMAL HIGH (ref <30)

## 2015-10-28 NOTE — Progress Notes (Signed)
Patient ID: [name], male    DOB: [birth year], 50 y.o.   MRN: [id number]  Chief complaint: followup for HIV on meds  HPI   50 year old Caucasian man with HIV perfectly controlled with ODefsey .He does have comorbid CAD, DM, difficult to control hyperlipidemia, HTN     Lab Results  Component Value Date   HIV1RNAQUANT <20 09/13/2014    Lab Results  Component Value Date   CD4TABS 1470 09/13/2014   CD4TABS 1200 12/20/2013   CD4TABS 1380 06/12/2013   Past Medical History  Diagnosis Date  . Diabetes mellitus type II   . HTN (hypertension)   . HIV (human immunodeficiency virus infection) ([place])   . 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  . 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 CAD (coronary artery disease)     DES.  /  ... Later (10/2007) intervention for in-stent restenosis ;  EF 60%... catheterization... June, 2009  . Sinus tachycardia (HCC)     mild at rest.... October, 2010  . Kidney calculi   . Ejection fraction     EF 60%, catheterization, 2009  . Dyslipidemia   . Syphilitic hepatitis   . Kidney stone   . Diabetes mellitus type 2, uncontrolled ([place]) 09/27/2014    Past Surgical History  Procedure Laterality Date  . Cholecystectomy    . Coronary stent placement      Family History  Problem Relation Age of Onset  . Anemia    . Diabetes        Social History   Social History  . Marital Status: Single    Spouse Name: N/A  . Number of Children: N/A  . Years of Education: N/A   Social History Main Topics  . Smoking status: Never Smoker   . Smokeless tobacco: Never Used  . Alcohol Use: 0.0 - 0.6 oz/week    0-1 Standard drinks or equivalent per week     Comment: socially  . Drug Use: No  . Sexual Activity: Not Asked   Other Topics Concern  . None   Social History Narrative    No Known Allergies   Current  outpatient prescriptions:  .  aspirin 81 MG tablet, Take 81 mg by mouth daily.  , Disp: , Rfl:  .  chlorthalidone (HYGROTON) 25 MG tablet, Take 1 tablet (25 mg total) by mouth daily., Disp: 90 tablet, Rfl: 3 .  insulin aspart (NOVOLOG FLEXPEN) 100 UNIT/ML injection, Inject 8 Units into the skin 3 (three) times daily before meals. Base dose is 8 units, adjust with 1 unit increments based on  Blood Glucose levels, Disp: , Rfl:  .  insulin glargine (LANTUS SOLOSTAR) 100 UNIT/ML injection, Inject 40 Units into the skin 2 (two) times daily. , Disp: , Rfl:  .  lisinopril (PRINIVIL,ZESTRIL) 10 MG tablet, Take 1 tablet (10 mg total) by mouth daily., Disp: 90 tablet, Rfl: 3 .  metoprolol (TOPROL-XL) 100 MG 24 hr tablet, Take 1 tablet (100 mg total) by mouth daily., Disp: 90 tablet, Rfl: 3 .  ODEFSEY 200-25-25 MG TABS tablet, TAKE 1 TABLET BY MOUTH DAILY WITH SUPPER., Disp: 30 tablet, Rfl: 5 .  omega-3 acid ethyl esters (LOVAZA) 1 g capsule, Take 2 capsules (2 g total) by mouth 2 (two) times daily., Disp: 360 capsule, Rfl: 11 .  rosuvastatin (CRESTOR) 40 MG tablet, Take 1 tablet (  40 mg total) by mouth daily., Disp: 90 tablet, Rfl: 3 .  traZODone (DESYREL) 50 MG tablet, Take 50 mg by mouth at bedtime., Disp: , Rfl:     Review of Systems  Constitutional: Negative for fever, chills, diaphoresis, activity change, appetite change, fatigue and unexpected weight change.  HENT: Negative for congestion, rhinorrhea, sinus pressure, sneezing, sore throat and trouble swallowing.   Eyes: Negative for photophobia and visual disturbance.  Respiratory: Negative for cough, chest tightness, shortness of breath, wheezing and stridor.   Cardiovascular: Negative for chest pain, palpitations and leg swelling.  Gastrointestinal: Negative for nausea, vomiting, abdominal pain, diarrhea, constipation, blood in stool, abdominal distention and anal bleeding.  Genitourinary: Negative for dysuria, hematuria and flank pain.   Musculoskeletal: Negative for myalgias, back pain, joint swelling, arthralgias and gait problem.  Skin: Negative for color change, pallor, rash and wound.  Neurological: Negative for dizziness, tremors, weakness and light-headedness.  Hematological: Negative for adenopathy. Does not bruise/bleed easily.  Psychiatric/Behavioral: Negative for behavioral problems, confusion, sleep disturbance, dysphoric mood, decreased concentration and agitation.       Objective:   Physical Exam  Constitutional: He is oriented to person, place, and time. He appears well-developed and well-nourished. No distress.  HENT:  Head: Normocephalic and atraumatic.  Mouth/Throat: Oropharynx is clear and moist. No oropharyngeal exudate.  Eyes: Conjunctivae and EOM are normal. Pupils are equal, round, and reactive to light. No scleral icterus.  Neck: Normal range of motion. Neck supple. No JVD present.  Cardiovascular: Normal rate, regular rhythm and normal heart sounds.  Exam reveals no gallop and no friction rub.   No murmur heard. Pulmonary/Chest: Effort normal and breath sounds normal. No respiratory distress. He has no wheezes. He has no rales. He exhibits no tenderness.  Abdominal: He exhibits no distension and no mass. There is no tenderness. There is no rebound and no guarding.  Musculoskeletal: He exhibits no edema or tenderness.  Lymphadenopathy:    He has no cervical adenopathy.  Neurological: He is alert and oriented to person, place, and time. He has normal reflexes. He exhibits normal muscle tone. Coordination normal.  Skin: Skin is warm and dry. He is not diaphoretic. No erythema. No pallor.  Psychiatric: He has a normal mood and affect. His behavior is normal. Judgment and thought content normal.          Assessment & Plan:  HIV: continue ODEFSEY  HTN: follows with  Asencion Noble  Hyperlipidemia: ""   CAD: on beta blocker, acei, statin, asa,  Syphliitic hepatitis: resolved  Obesity:  followuop with PCP

## 2015-10-29 LAB — URINE CYTOLOGY ANCILLARY ONLY
Chlamydia: NEGATIVE
Neisseria Gonorrhea: NEGATIVE

## 2015-10-29 LAB — HIV-1 RNA QUANT-NO REFLEX-BLD
HIV 1 RNA Quant: <20 copies/mL (ref <20)
HIV-1 RNA Quant, Log: <1.3 Log copies/mL (ref <1.30)

## 2015-10-29 LAB — T-HELPER CELL (CD4) - (RCID CLINIC ONLY)
CD4 % Helper T Cell: 45 % (ref 33–55)
CD4 T Cell Abs: 980 /uL (ref 400–2700)

## 2015-10-29 LAB — RPR

## 2015-11-04 ENCOUNTER — Ambulatory Visit: Payer: 59 | Admitting: Infectious Disease

## 2015-11-25 DIAGNOSIS — E119 Type 2 diabetes mellitus without complications: Secondary | ICD-10-CM | POA: Diagnosis not present

## 2015-12-02 DIAGNOSIS — E785 Hyperlipidemia, unspecified: Secondary | ICD-10-CM | POA: Diagnosis not present

## 2015-12-02 DIAGNOSIS — E1129 Type 2 diabetes mellitus with other diabetic kidney complication: Secondary | ICD-10-CM | POA: Diagnosis not present

## 2015-12-30 ENCOUNTER — Other Ambulatory Visit: Payer: Self-pay | Admitting: Pharmacist

## 2015-12-30 ENCOUNTER — Encounter: Payer: Self-pay | Admitting: Pharmacist

## 2015-12-30 NOTE — Patient Outreach (Signed)
[facility] [facility]) Care Management  [place]   01/01/2016  [name] Jan 31, 1966 [id number]  Subjective: Patient presents today for diabetes follow-up as part of the employer-sponsored Link to Wellness program.  Current diabetes regimen includes Lantus 45 units twice daily and Novolog 10 units three times daily before meals.  Patient also continues on daily ASA, and rosuvastatin.  Most recent MD follow-up was 11/2015.  Patient has a pending appt for 02/2016.  His Lantus and Novolog was increased at his last visit. He states that his blood glucose values have improved since the insulin was increased. Patient denies major health changes since his last visit with link to wellness.  He is very stressed out today due to plumbing issues that are very costly to his house.  Patient reports adherence to his medications and he uses a pill box.   Patient denies hypoglycemic events. Reports proper treatment of hypoglycemia  Patient reported dietary habits: Eats 2-3 meals/day Breakfast:breakfast burrito from [employer], couple pieces of bacon Lunch:cafeteria food at [employer] (varies Dinner:nutrigrain bar (sometimes misses dinner), chicken pot pie  Snacks: mostly snacks at work not at home.  crackers Drinks:diet coke mostly 90% of the time He states that he is a stress eater.  He goes without eating with stress then binge eats. He has not been eating yesterday and today due to stress.   Patient reported exercise habits: Walks 3 days per week for 20 minutes per day    Patient denies nocturia.  Patient denies pain/burning upon urination.  Patient denies neuropathy. Patient denies visual changes. Patient reports self foot exams. Denies changes.   Checks BG before breakfast and lunch.  Did not bring meter today. Often misses before dinner  Home fasting CBG: 160-210 Before Lunch (reported, no meter today): 150-220.  Reports one value >400 but reports this only happened once.    Objective:   11/2015: A1C 10.2%  Vitals:   12/30/15 1405  BP: 140/85  Pulse: 82    Lipid Panel     Component Value Date/Time   CHOL 141 10/28/2015 0957   TRIG 295 (H) 10/28/2015 0957   HDL 40 10/28/2015 0957   CHOLHDL 3.5 10/28/2015 0957   VLDL 59 (H) 10/28/2015 0957   LDLCALC 42 10/28/2015 0957      Encounter Medications: Outpatient Encounter Prescriptions as of 12/30/2015  Medication Sig  . aspirin 81 MG tablet Take 81 mg by mouth daily.    . chlorthalidone (HYGROTON) 25 MG tablet Take 1 tablet (25 mg total) by mouth daily.  . insulin aspart (NOVOLOG FLEXPEN) 100 UNIT/ML injection Inject 10 Units into the skin 3 (three) times daily before meals.   . insulin glargine (LANTUS SOLOSTAR) 100 UNIT/ML injection Inject 45 Units into the skin 2 (two) times daily.   [name] lisinopril (PRINIVIL,ZESTRIL) 10 MG tablet Take 1 tablet (10 mg total) by mouth daily.  . metoprolol (TOPROL-XL) 100 MG 24 hr tablet Take 1 tablet (100 mg total) by mouth daily.  . ODEFSEY 200-25-25 MG TABS tablet TAKE 1 TABLET BY MOUTH DAILY WITH SUPPER.  [name] omega-3 acid ethyl esters (LOVAZA) 1 g capsule Take 2 capsules (2 g total) by mouth 2 (two) times daily.  . rosuvastatin (CRESTOR) 40 MG tablet Take 1 tablet (40 mg total) by mouth daily.  . traZODone (DESYREL) 50 MG tablet Take 50 mg by mouth at bedtime as needed.    No facility-administered encounter medications on file as of 12/30/2015.     Functional Status: In  your present state of health, do you have any difficulty performing the following activities: 01/01/2016  Hearing? N  Vision? N  Difficulty concentrating or making decisions? N  Walking or climbing stairs? N  Dressing or bathing? N  Doing errands, shopping? N  Some recent data might be hidden    Fall/Depression Screening: PHQ 2/9 Scores 12/30/2015 10/28/2015 09/19/2014 05/02/2013 01/25/2013  PHQ - 2 Score 0 0 0 0 0     Assessment:  Diabetes: Most recent A1C was 10.2% which is at above goal of less than 7%. Weight  is stable from last visit with me. He continues on statin and aspirin.  Patient denies signs and symptoms of hypoglycemia. Patient did not bring meter today but reports blood glucose that have improved since his last A1C but still above goal.   Lifestyle improvements:  Physical Activity- Patient below goal of 150 minutes/week. of mild/moderate exercise.  Nutrition-  Often doesn't adhere to low carbohydrate, skips meals, and sometimes binge eats when he is stressed.    Plan/Goals for Next Visit -Discussed importance of checking his blood sugar regularly and importance of bringing meter to every visit -Counseled on low carbohydrate diet and portion sizes. Discussed patient eating more regular meals to help better control his diabetes -Patient set weight loss goal of 5-10 lbs.  -Counseled on exercise goal of 150 minutes per week. Encouraged patient to continue walking 3 days per week and increase gradually  Next appointment to see me is: 03/30/16 at 11 am (telephone)   Bennye Alm, PharmD [facility] PGY2 Pharmacy Resident [phone]  [facility] CM Care Plan Problem One   Flowsheet Row Most Recent Value  Care Plan Problem One  Knowledge deficit with nutrition management as evidenced by lack of consistency with low carbohydrate diet  Role Documenting the Problem One  Clinical Pharmacist  Care Plan for Problem One  Active  [facility] Long Term Goal (31-90 days)  Patient will eat more regular meals and try to avoid binge eating in order to better control his blood sugars over the next 90 days as evidenced by patient report  [place] Term Goal Start Date  01/01/16  Interventions for Problem One Long Term Goal  Counseled on low carbohydrate diet and portion sizes. Discussed patient eating more regular meals to help better control his diabetes.    [facility] CM Care Plan Problem Two   Flowsheet Row Most Recent Value  Care Plan Problem Two  Recent Weight Gain  Role Documenting the Problem Two  Clinical Pharmacist  Care  Plan for Problem Two  Active  Interventions for Problem Two Long Term Goal    Counseled on exercise goal of 150 minutes per week. Encouraged patient to continue walking 3 days per week and increase gradually. Counseled on low carbohydrate diet and portion sizes. Discussed patient eating more regular meals to help better control his diabetes.  [facility] Long Term Goal (31-90) days  Within 90 days patient will lose at least 5-10 lbs (goal wt less than 200 lb) as evidenced by reading on scale.  [facility] Long Term Goal Start Date  09/19/14

## 2016-02-06 MED FILL — LISINOPRIL 10 MG TABLET: 10 | 90 days supply | Qty: 90 | Fill #1

## 2016-02-06 MED FILL — CHLORTHALIDONE 25 MG TABLET: 25 | 90 days supply | Qty: 90 | Fill #1

## 2016-02-06 MED FILL — METOPROLOL SUCC ER 100 MG T: 100 | 90 days supply | Qty: 90 | Fill #1

## 2016-02-06 MED FILL — NOVOLOG FLEXPEN SYRINGE: 100 | 90 days supply | Qty: 60 | Fill #2

## 2016-02-06 MED FILL — ROSUVASTATIN CALCIUM 40 MG: 40 | 90 days supply | Qty: 90 | Fill #1

## 2016-02-06 MED FILL — ODEFSEY 200-25-25 MG TABS: 200-25-25 | 90 days supply | Qty: 90 | Fill #0

## 2016-02-06 MED FILL — LANTUS SOLOSTAR 100 UNITS/M: 100 | 87 days supply | Qty: 105 | Fill #2

## 2016-03-23 ENCOUNTER — Other Ambulatory Visit: Payer: Self-pay | Admitting: Pharmacist

## 2016-03-23 NOTE — Patient Outreach (Signed)
No answer - left HIPAA compliant messages on both home and mobile number.   Carlean Jews, Pharm.D. PGY1 Pharmacy Resident 10/30/20174:04 PM Pager [phone]

## 2016-03-30 ENCOUNTER — Ambulatory Visit: Payer: 59 | Admitting: Pharmacist

## 2016-05-29 MED FILL — CHLORTHALIDONE 25 MG TABLET: 25 | 90 days supply | Qty: 90 | Fill #2

## 2016-05-29 MED FILL — ODEFSEY 200-25-25 MG TABS: 200-25-25 | 30 days supply | Qty: 30 | Fill #1

## 2016-05-29 MED FILL — HUMALOG 100 UNITS/ML KWIKPE: 100 | 90 days supply | Qty: 60 | Fill #0

## 2016-05-29 MED FILL — METOPROLOL SUCC ER 100 MG T: 100 | 90 days supply | Qty: 90 | Fill #2

## 2016-05-29 MED FILL — LISINOPRIL 10 MG TABLET: 10 | 90 days supply | Qty: 90 | Fill #2

## 2016-05-29 MED FILL — LANTUS SOLOSTAR 100 UNITS/M: 100 | 87 days supply | Qty: 105 | Fill #3

## 2016-05-29 MED FILL — ROSUVASTATIN CALCIUM 40 MG: 40 | 90 days supply | Qty: 90 | Fill #2

## 2016-07-08 MED FILL — ODEFSEY 200-25-25 MG TABS: 200-25-25 | 30 days supply | Qty: 30 | Fill #2

## 2016-08-05 MED FILL — ODEFSEY 200-25-25 MG TABS: 200-25-25 | 30 days supply | Qty: 30 | Fill #3

## 2016-08-31 ENCOUNTER — Other Ambulatory Visit: Payer: Self-pay | Admitting: Pharmacist

## 2016-08-31 DIAGNOSIS — B20 Human immunodeficiency virus [HIV] disease: Secondary | ICD-10-CM

## 2016-08-31 MED ORDER — EMTRICITAB-RILPIVIR-TENOFOV AF 200-25-25 MG PO TABS: 1.0000 | ORAL_TABLET | Freq: Every day | ORAL | 5 refills | Status: DC

## 2016-08-31 MED FILL — ODEFSEY 200-25-25 MG TABS: 200-25-25 | 30 days supply | Qty: 30 | Fill #0

## 2016-09-15 MED FILL — LISINOPRIL 10 MG TABLET: 10 | 90 days supply | Qty: 90 | Fill #3

## 2016-09-15 MED FILL — METOPROLOL SUCC ER 100 MG T: 100 | 90 days supply | Qty: 90 | Fill #3

## 2016-09-15 MED FILL — CHLORTHALIDONE 25 MG TABLET: 25 | 90 days supply | Qty: 90 | Fill #3

## 2016-09-17 MED FILL — LANTUS SOLOSTAR 100 UNITS/M: 100 | 84 days supply | Qty: 90 | Fill #0

## 2016-09-17 MED FILL — ROSUVASTATIN CALCIUM 40 MG: 40 | 90 days supply | Qty: 90 | Fill #3

## 2016-09-18 MED FILL — HUMALOG 100 UNITS/ML KWIKPE: 100 | 90 days supply | Qty: 60 | Fill #0 | Status: TO

## 2016-09-24 DIAGNOSIS — Z6832 Body mass index (BMI) 32.0-32.9, adult: Secondary | ICD-10-CM | POA: Diagnosis not present

## 2016-09-24 DIAGNOSIS — J019 Acute sinusitis, unspecified: Secondary | ICD-10-CM | POA: Diagnosis not present

## 2016-09-24 MED FILL — AMOX-CLAV 875-125 MG TABLET: 875-125 | 7 days supply | Qty: 14 | Fill #0

## 2016-10-27 DIAGNOSIS — I10 Essential (primary) hypertension: Secondary | ICD-10-CM | POA: Diagnosis not present

## 2016-10-27 DIAGNOSIS — E1139 Type 2 diabetes mellitus with other diabetic ophthalmic complication: Secondary | ICD-10-CM | POA: Diagnosis not present

## 2016-10-27 DIAGNOSIS — Z6833 Body mass index (BMI) 33.0-33.9, adult: Secondary | ICD-10-CM | POA: Diagnosis not present

## 2016-10-27 MED FILL — ODEFSEY 200-25-25 MG TABS: 200-25-25 | 30 days supply | Qty: 30 | Fill #0

## 2016-10-28 ENCOUNTER — Encounter (INDEPENDENT_AMBULATORY_CARE_PROVIDER_SITE_OTHER): Payer: Self-pay | Admitting: *Deleted

## 2016-10-28 DIAGNOSIS — Z79899 Other long term (current) drug therapy: Secondary | ICD-10-CM | POA: Diagnosis not present

## 2016-10-28 DIAGNOSIS — E785 Hyperlipidemia, unspecified: Secondary | ICD-10-CM | POA: Diagnosis not present

## 2016-10-28 DIAGNOSIS — E1129 Type 2 diabetes mellitus with other diabetic kidney complication: Secondary | ICD-10-CM | POA: Diagnosis not present

## 2016-11-04 ENCOUNTER — Encounter (INDEPENDENT_AMBULATORY_CARE_PROVIDER_SITE_OTHER): Payer: Self-pay | Admitting: *Deleted

## 2016-11-09 ENCOUNTER — Encounter: Payer: Self-pay | Admitting: Pharmacist

## 2016-11-09 ENCOUNTER — Other Ambulatory Visit: Payer: Self-pay | Admitting: Pharmacist

## 2016-11-09 NOTE — Patient Outreach (Signed)
[facility]) Care Management  11/09/2016  [name] 04-14-1966 [id number]  Subjective: Patient presents today for 3 month diabetes follow-up as part of the employer-sponsored Link to Wellness program.  Current diabetes regimen includes lantus which was recently increased to 60 units BID and humalog 10-12 units TID with meals (estimates insulin dose based on CHO intake, does not check BG with lunch).  Patient also continues on daily aspirin, lisinopril and rosuvastatin.  Most recent MD follow-up was ~2 weeks ago with PCP who manages patient's DM.  Patient has a pending appt for  January 18 2017.  No other med changes or major health changes at this time.   Per patient, thinks his A1C is worse 2/2 change in location at work. New location is off-site and there is frequently down time during which he snacks. See below for frequent choices. Per patient, has cut out binge eating altogether. Interested in CGM - gave information about [employer]. Patient did not bring meter today. Patient endorses recent family stresses as sister is in hospital with open heart surgery.   Patient reported dietary habits: Eats 2-3 meals/day on work days (3 days/week); 1-2 meals/day on days off.  Breakfast:  egg white+turkey sausage english muffin Lunch: packs lunch, usually leftovers  - salad + [place] dressing, prime rib, green beans Dinner:  usually cooks at home, take out sometimes Snacks: lots of snacks at work: nuts, popcorn, cherry tomatoes, greek yogurt Drinks: diet coke mostly, ~32 oz of water/day  Patient reported exercise habits: 15-20 mins on the bike during down time at work (3d/wk). On off days takes dogs for walk for ~10 mins. Walks dogs less in the summer time.   Patient denies hypoglycemic events.  Patient denies nocturia .  Patient denies pain/burning upon urination.  Patient denies neuropathy. Patient denies visual changes. Patient reports self foot exams.   Patient  reported self monitored blood glucose frequency - BID. Of note, patient's current meter is 51 years old. Home fasting CBG: high 100s  2 hour post-prandial/random CBG: qHS "all over the board" usually 200s  Objective:  Lab Results  Component Value Date   HGBA1C 8.5 03/30/2009   Vitals:   11/09/16 1224  BP: (!) 145/82  Pulse: 78  Weight 204 lbs (up, per patient)  Lipid Panel     Component Value Date/Time   CHOL 141 10/28/2015 0957   TRIG 295 (H) 10/28/2015 0957   HDL 40 10/28/2015 0957   CHOLHDL 3.5 10/28/2015 0957   VLDL 59 (H) 10/28/2015 0957   LDLCALC 42 10/28/2015 0957   10 year ASCVD risk: unable to calculate as last lipid panel from 2017 - on high intensity statin already   Assessment:  Diabetes: Worsened control - Most recent A1C was up to 12% per patient repot which is above goal of less than 7%. A1C worsening is attributed to increased snacking and poor food choices while bored at work. Weight is up from last visit with [facility]W RPh per patient.   Lifestyle improvements: cut out binge eating  Physical Activity- moderate but below goal evidenced by exercise habits  Nutrition- needs improvement. Can improve by changing snack choices.    Plan/Goals for Next Visit: - Get new meter from pharmacy - Work on improving snack choices - Increase exercise to 150 mins/week, try to get on the bike at work more regularly and increase to 30 mins instead of 15-20/session  Next appointment will be scheduled with incoming PGY1 Pharmacy Resident in 3  month  Carlean Jews, Pharm.D. PGY1 Pharmacy Resident 6/18/20184:59 PM Pager [phone]  [facility] CM Care Plan Problem One     Most Recent Value  Care Plan Problem One  Lack of motivation to adhere to DM diet as evidenced by lack of consistency with low carbohydrate diet  Role Documenting the Problem One  Clinical Pharmacist  Care Plan for Problem One  Active  [facility] Long Term Goal   Reduce number of CHO-rich snack foods over the next  90 days by bringing vegetables instead of yogurt and chips to work, as evidenced by a reduction in A1c  [facility] Long Term Goal Start Date  11/09/16  Interventions for Problem One Long Term Goal  Counseled on ways to increase success: involving coworkers, planning ahead, drinking water instead of eating    [facility] CM Care Plan Problem Two     Most Recent Value  Care Plan Problem Two  Weight gain  Role Documenting the Problem Two  Clinical Pharmacist  Care Plan for Problem Two  Active  Interventions for Problem Two Long Term Goal    Counseled on exercise goal of 150 minutes per week. Encouraged patient to continue walking 4 days per week and increase gradually. Counseled on low carbohydrate diet and portion sizes. Discussed patient eating more regular meals to help better control his diabetes.  [facility] Long Term Goal  Over the next 90 days, patient will lose 5-10 lbs as evidenced by reading on scale at next appointment visit  [place] Start Date  11/09/16

## 2016-11-13 ENCOUNTER — Encounter: Payer: Self-pay | Admitting: Cardiology

## 2016-11-18 ENCOUNTER — Other Ambulatory Visit (INDEPENDENT_AMBULATORY_CARE_PROVIDER_SITE_OTHER): Payer: Self-pay | Admitting: *Deleted

## 2016-11-18 DIAGNOSIS — Z800 Family history of malignant neoplasm of digestive organs: Secondary | ICD-10-CM | POA: Insufficient documentation

## 2016-11-18 DIAGNOSIS — Z1211 Encounter for screening for malignant neoplasm of colon: Secondary | ICD-10-CM

## 2016-11-24 ENCOUNTER — Ambulatory Visit: Payer: 59 | Admitting: Infectious Disease

## 2016-11-24 MED FILL — ODEFSEY 200-25-25 MG TABS: 200-25-25 | 30 days supply | Qty: 30 | Fill #1

## 2016-11-27 ENCOUNTER — Ambulatory Visit: Payer: 59 | Admitting: Infectious Disease

## 2016-11-30 ENCOUNTER — Ambulatory Visit (INDEPENDENT_AMBULATORY_CARE_PROVIDER_SITE_OTHER): Payer: 59 | Admitting: Infectious Disease

## 2016-11-30 ENCOUNTER — Other Ambulatory Visit: Payer: Self-pay | Admitting: *Deleted

## 2016-11-30 ENCOUNTER — Telehealth: Payer: Self-pay | Admitting: *Deleted

## 2016-11-30 ENCOUNTER — Other Ambulatory Visit (HOSPITAL_COMMUNITY)
Admission: RE | Admit: 2016-11-30 | Discharge: 2016-11-30 | Disposition: A | Discharge status: Home / Self Care | Payer: 59 | Source: Ambulatory Visit | Attending: Infectious Disease | Admitting: Infectious Disease

## 2016-11-30 DIAGNOSIS — Z113 Encounter for screening for infections with a predominantly sexual mode of transmission: Secondary | ICD-10-CM | POA: Insufficient documentation

## 2016-11-30 DIAGNOSIS — B20 Human immunodeficiency virus [HIV] disease: Secondary | ICD-10-CM | POA: Diagnosis not present

## 2016-11-30 LAB — CBC WITH DIFFERENTIAL/PLATELET
Basophils Absolute: 63 cells/uL (ref 0–200)
Basophils Relative: 1 %
Eosinophils Absolute: 315 cells/uL (ref 15–500)
Eosinophils Relative: 5 %
HCT: 46.8 % (ref 38.5–50.0)
Hemoglobin: 15.8 g/dL (ref 13.2–17.1)
Lymphocytes Relative: 51 %
Lymphs Abs: 3213 cells/uL (ref 850–3900)
MCH: 29.1 pg (ref 27.0–33.0)
MCHC: 33.8 g/dL (ref 32.0–36.0)
MCV: 86.2 fL (ref 80.0–100.0)
MPV: 8.7 fL (ref 7.5–12.5)
Monocytes Absolute: 630 cells/uL (ref 200–950)
Monocytes Relative: 10 %
Neutro Abs: 2079 cells/uL (ref 1500–7800)
Neutrophils Relative %: 33 %
Platelets: 279 10*3/uL (ref 140–400)
RBC: 5.43 MIL/uL (ref 4.20–5.80)
RDW: 13.5 % (ref 11.0–15.0)
WBC: 6.3 10*3/uL (ref 3.8–10.8)

## 2016-11-30 LAB — COMPLETE METABOLIC PANEL WITH GFR
ALT: 26 U/L (ref 9–46)
AST: 19 U/L (ref 10–35)
Albumin: 4.2 g/dL (ref 3.6–5.1)
Alkaline Phosphatase: 52 U/L (ref 40–115)
BUN: 15 mg/dL (ref 7–25)
CO2: 25 mmol/L (ref 20–31)
Calcium: 10.1 mg/dL (ref 8.6–10.3)
Chloride: 100 mmol/L (ref 98–110)
Creat: 0.8 mg/dL (ref 0.70–1.33)
GFR, Est African American: >89 mL/min (ref >=60)
GFR, Est Non African American: >89 mL/min (ref >=60)
Glucose, Bld: 95 mg/dL (ref 65–99)
Potassium: 4 mmol/L (ref 3.5–5.3)
Sodium: 140 mmol/L (ref 135–146)
Total Bilirubin: 0.5 mg/dL (ref 0.2–1.2)
Total Protein: 6.9 g/dL (ref 6.1–8.1)

## 2016-11-30 NOTE — Telephone Encounter (Signed)
Patient arrived 30+ minutes late for his appointment. Per Dr Tommy Medal, RN reconciled patient's medication and entered lab orders to be collected today. Patient is due for pneumonia and menveo immunizations.  RN left message at patient's request asking him to call to schedule a nurse visit for these immunizations. Landis Gandy, RN

## 2016-11-30 NOTE — Progress Notes (Signed)
Pt not seen.

## 2016-12-01 ENCOUNTER — Encounter: Payer: Self-pay | Admitting: Cardiology

## 2016-12-01 ENCOUNTER — Ambulatory Visit (INDEPENDENT_AMBULATORY_CARE_PROVIDER_SITE_OTHER): Payer: 59 | Admitting: Cardiology

## 2016-12-01 VITALS — BP 158/78 | HR 93 | Ht 66.0 in | Wt 205.0 lb

## 2016-12-01 DIAGNOSIS — I2510 Atherosclerotic heart disease of native coronary artery without angina pectoris: Secondary | ICD-10-CM

## 2016-12-01 LAB — RPR

## 2016-12-01 LAB — T-HELPER CELL (CD4) - (RCID CLINIC ONLY)
CD4 % Helper T Cell: 35 % (ref 33–55)
CD4 T Cell Abs: 1350 /uL (ref 400–2700)

## 2016-12-01 LAB — URINE CYTOLOGY ANCILLARY ONLY
Chlamydia: NEGATIVE
Neisseria Gonorrhea: NEGATIVE

## 2016-12-01 NOTE — Progress Notes (Signed)
12/01/2016 LINDSAY SOULLIERE   04/19/1966  [id number]  Primary Physician Asencion Noble, MD Primary Cardiologist: Dr. Meda Coffee (Previously Dr. Ron [name])   Reason for Visit/CC: 1 year f/u for CAD, HTN and HLD  HPI:  [name] is a 51 y.o. male who is being seen today for 1 year f/u for CAD, HTN and HLD. He is a former patient of Dr. Ron [name], but now followed by Dr. Meda Coffee. Last seen in clinic 09/2015. He has a h/o HIV on antiretroviral medications, CAD s/p 2 stent placements in 2009, HTN and poorly controlled HLD with hereditary hypertriglyceridemia. His last lipid panel 09/2015 showed TG at 295 (previously 1,157). LDL was controlled at 42 mg/dL. He is on Crestor and Lovaza, along with ASA, lisinopril, metoprolol and chlorthalidone. His PCP is Dr. Asencion Noble.  Today in f/u, he reports that he has done well. No CP or dyspnea. No exertional dyspnea. He recalls having substernal chest pressure in 2009 prior to receiving his stents. He denies any similar symptoms. EKG shows NSR w/o ischemic abnormalities. His BP is moderately elevated at 158/78. Checked twice in clinic. He reports full med compliance but notes that he takes all of his meds around 3PM each day. He states that his BP is usually well controlled at doctors visits. He has a f/u with is PCP in the next 3 weeks. He has a home BP cuff and will monitor closely. He reports that his PCP recently checked basic labs including a lipid panel. He was told his cholesterol numbers looked great, but was also told that his Hgb A1c was elevated.     Current Meds  Medication Sig  . aspirin 81 MG tablet Take 81 mg by mouth daily.    . chlorthalidone (HYGROTON) 25 MG tablet Take 1 tablet (25 mg total) by mouth daily.  [name] emtricitabine-rilpivir-tenofovir AF (ODEFSEY) 200-25-25 MG TABS tablet Take 1 tablet by mouth daily with breakfast.  . HUMALOG KWIKPEN 100 UNIT/ML KiwkPen Sliding scale as instructed  . insulin glargine (LANTUS SOLOSTAR) 100 UNIT/ML injection Inject 60  Units into the skin 2 (two) times daily.   [name] lisinopril (PRINIVIL,ZESTRIL) 10 MG tablet Take 1 tablet (10 mg total) by mouth daily.  . metoprolol (TOPROL-XL) 100 MG 24 hr tablet Take 1 tablet (100 mg total) by mouth daily.  [name] omega-3 acid ethyl esters (LOVAZA) 1 g capsule Take 2 capsules (2 g total) by mouth 2 (two) times daily.  . rosuvastatin (CRESTOR) 40 MG tablet Take 1 tablet (40 mg total) by mouth daily.  . traZODone (DESYREL) 50 MG tablet Take 50 mg by mouth at bedtime as needed.   . [DISCONTINUED] insulin aspart (NOVOLOG FLEXPEN) 100 UNIT/ML injection Inject 8-10 Units into the skin 3 (three) times daily before meals.    No Known Allergies Past Medical History:  Diagnosis Date  . CAD (coronary artery disease)    DES.  /  ... Later (10/2007) intervention for in-stent restenosis ;  EF 60%... catheterization... June, 2009  . Diabetes mellitus type 2, uncontrolled ([place]) 09/27/2014  . Diabetes mellitus type II   . Dyslipidemia   . Ejection fraction    EF 60%, catheterization, 2009  . HIV (human immunodeficiency virus infection) ([place])   . HTN (hypertension)   . Kidney calculi   . Kidney stone   . 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  . 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 RPR  reverted  to negative  . Sinus tachycardia    mild at rest.... October, 2010  . Syphilitic hepatitis    Family History  Problem Relation Age of Onset  . Anemia Unknown   . Diabetes Unknown    Past Surgical History:  Procedure Laterality Date  . CHOLECYSTECTOMY    . CORONARY STENT PLACEMENT     Social History   Social History  . Marital status: Single    Spouse name: N/A  . Number of children: N/A  . Years of education: N/A   Occupational History  . Not on file.   Social History Main Topics  . Smoking status: Never Smoker  . Smokeless tobacco: Never Used  . Alcohol use 0.0 - 0.6  oz/week     Comment: socially  . Drug use: No  . Sexual activity: Not on file   Other Topics Concern  . Not on file   Social History Narrative  . No narrative on file     Review of Systems: General: negative for chills, fever, night sweats or weight changes.  Cardiovascular: negative for chest pain, dyspnea on exertion, edema, orthopnea, palpitations, paroxysmal nocturnal dyspnea or shortness of breath Dermatological: negative for rash Respiratory: negative for cough or wheezing Urologic: negative for hematuria Abdominal: negative for nausea, vomiting, diarrhea, bright red blood per rectum, melena, or hematemesis Neurologic: negative for visual changes, syncope, or dizziness All other systems reviewed and are otherwise negative except as noted above.   Physical Exam:  Blood pressure (!) 158/78, pulse 93, height 5\' 6"  (1.676 m), weight 205 lb (93 kg).  General appearance: alert, cooperative and no distress Neck: no carotid bruit and no JVD Lungs: clear to auscultation bilaterally Heart: regular rate and rhythm, S1, S2 normal, no murmur, click, rub or gallop Extremities: extremities normal, atraumatic, no cyanosis or edema Pulses: 2+ and symmetric Skin: Skin color, texture, turgor normal. No rashes or lesions Neurologic: Grossly normal  EKG NSR 93 bpm -- personally reviewed   ASSESSMENT AND PLAN:   1. 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 Stable w/o anginal symptoms. No exertional CP or dyspnea. EKG nonischemic. Continue ASA, statin, BB and ACE.   2. HLD: h/o hereditary hypertriglyceridemia. His last lipid panel we have on file 09/2015 showed TG at 295 (previously 1,157). LDL was controlled at 42 mg/dL. He is on Crestor and Lovaza. His PCP recently checked FLP last week and he was told his numbers look great. We will call Dr. Ria Comment office for labs to be faxed for our review.   3. HTN: followed by PCP and moderately elevated  today at 158/78. Checked twice in clinic. He reports full med compliance but notes that he takes all of his meds around 3PM each day. He has not yet taken today's meds. He states that his BP is usually well controlled at doctors visits. He has a f/u with is PCP in the next 3 weeks. He has a home BP cuff and will monitor closely. He was instructed to keep a log and advised that his goal systolic BP should be <119 and diastolic <41. He has f/u with PCP in 3 weeks who will recheck. If not at goal, he may need further titration of lisinopril and or metoprolol.   4. HIV:  on antiretroviral therapy   5. DM: followed by PCP.    Follow-Up with Dr. Meda Coffee in 1 year   [name], MHS [facility] 12/01/2016 9:12 AM

## 2016-12-01 NOTE — Patient Instructions (Addendum)
Medication Instructions:  Your physician recommends that you continue on your current medications as directed. Please refer to the Current Medication list given to you today.  Labwork: None  Testing/Procedures: None  Follow-Up: Your physician wants you to follow-up in: 9 months with Dr. Meda Coffee. You will receive a reminder letter in the mail two months in advance. If you don't receive a letter, please call our office to schedule the follow-up appointment.   Any Other Special Instructions Will Be Listed Below (If Applicable).  Continue to monitor blood pressures at home and keep a log. We would like for your home readings to be below 130/80.   If you need a refill on your cardiac medications before your next appointment, please call your pharmacy.

## 2016-12-02 LAB — HIV-1 RNA QUANT-NO REFLEX-BLD
HIV 1 RNA Quant: 34 copies/mL — ABNORMAL HIGH
HIV-1 RNA Quant, Log: 1.53 Log copies/mL — ABNORMAL HIGH

## 2016-12-10 ENCOUNTER — Telehealth: Payer: Self-pay | Admitting: Pharmacist

## 2016-12-17 MED FILL — ODEFSEY 200-25-25 MG TABS: 200-25-25 | 30 days supply | Qty: 30 | Fill #2

## 2017-01-11 MED FILL — ODEFSEY 200-25-25 MG TABS: 200-25-25 | 30 days supply | Qty: 30 | Fill #3

## 2017-01-19 DIAGNOSIS — E119 Type 2 diabetes mellitus without complications: Secondary | ICD-10-CM | POA: Diagnosis not present

## 2017-01-26 DIAGNOSIS — E1129 Type 2 diabetes mellitus with other diabetic kidney complication: Secondary | ICD-10-CM | POA: Diagnosis not present

## 2017-01-28 ENCOUNTER — Encounter (INDEPENDENT_AMBULATORY_CARE_PROVIDER_SITE_OTHER): Payer: Self-pay | Admitting: *Deleted

## 2017-01-28 ENCOUNTER — Telehealth (INDEPENDENT_AMBULATORY_CARE_PROVIDER_SITE_OTHER): Payer: Self-pay | Admitting: *Deleted

## 2017-01-28 NOTE — Telephone Encounter (Signed)
Patient needs trilyte 

## 2017-01-29 MED ORDER — PEG 3350-KCL-NA BICARB-NACL 420 G PO SOLR: 4000.0000 mL | Freq: Once | ORAL | 0 refills | Status: AC

## 2017-02-01 MED FILL — traZODone HCL 100 MG TABS: 100 | 90 days supply | Qty: 90 | Fill #0

## 2017-02-02 MED FILL — ODEFSEY 200-25-25 MG TABS: 200-25-25 | 30 days supply | Qty: 30 | Fill #4

## 2017-02-16 ENCOUNTER — Ambulatory Visit: Payer: 59 | Admitting: Pharmacist

## 2017-02-16 ENCOUNTER — Ambulatory Visit: Payer: Self-pay | Admitting: Pharmacist

## 2017-02-19 ENCOUNTER — Telehealth (INDEPENDENT_AMBULATORY_CARE_PROVIDER_SITE_OTHER): Payer: Self-pay | Admitting: *Deleted

## 2017-02-19 NOTE — Telephone Encounter (Signed)
Referring MD/PCP: fagan   Procedure: tcs  Reason/Indication:  Screening, fam hx colon ca  Has patient had this procedure before?  no  If so, when, by whom and where?    Is there a family history of colon cancer?  Yes, uncle, aunt & grandparent  Who?  What age when diagnosed?    Is patient diabetic?   yes      Does patient have prosthetic heart valve or mechanical valve?  no  Do you have a pacemaker?  no  Has patient ever had endocarditis? no  Has patient had joint replacement within last 12 months?  o  Does patient tend to be constipated or take laxatives? no  Does patient have a history of alcohol/drug use?  no  Is patient on Coumadin, Plavix and/or Aspirin? yes  Medications: asa 81 mg daily, lantus 60 units bid, novolog sliding scale, chlorthalidone 25 mg daily, lisinopril 10 mg daily, metoprolol 100 mg daily, rosuvastatin 40 mg daily, odefsey 200/25/25 mg daily  Allergies: nkda  Medication Adjustment per Dr Laural Golden: asa 2 days, decrease lantus to 30 units am before & 15 unite pm before, continue sliding scale  Procedure date & time: 03/24/17 at 830

## 2017-02-22 NOTE — Telephone Encounter (Signed)
agree

## 2017-03-01 ENCOUNTER — Other Ambulatory Visit: Payer: Self-pay | Admitting: Pharmacist

## 2017-03-01 DIAGNOSIS — B20 Human immunodeficiency virus [HIV] disease: Secondary | ICD-10-CM

## 2017-03-01 MED FILL — HUMALOG 100 UNITS/ML KWIKPE: 100 | 90 days supply | Qty: 54 | Fill #0

## 2017-03-03 ENCOUNTER — Other Ambulatory Visit: Payer: Self-pay | Admitting: Pharmacist

## 2017-03-03 MED FILL — ODEFSEY 200-25-25 MG TABS: 200-25-25 | 30 days supply | Qty: 30 | Fill #0

## 2017-03-11 ENCOUNTER — Other Ambulatory Visit: Payer: Self-pay | Admitting: Pharmacist

## 2017-03-15 MED FILL — PEG-3350 SOLUTION: 420 | 1 days supply | Qty: 4000 | Fill #0

## 2017-03-22 ENCOUNTER — Ambulatory Visit: Payer: 59 | Admitting: Infectious Disease

## 2017-03-23 ENCOUNTER — Inpatient Hospital Stay (HOSPITAL_COMMUNITY): Payer: 59

## 2017-03-23 ENCOUNTER — Encounter: Payer: Self-pay | Admitting: Infectious Disease

## 2017-03-23 ENCOUNTER — Other Ambulatory Visit: Payer: Self-pay

## 2017-03-23 ENCOUNTER — Encounter (HOSPITAL_COMMUNITY): Payer: Self-pay | Admitting: Family Medicine

## 2017-03-23 ENCOUNTER — Observation Stay (HOSPITAL_COMMUNITY)
Admission: AD | Admit: 2017-03-23 | Discharge: 2017-03-24 | Disposition: A | Discharge status: Home / Self Care | Payer: 59 | Source: Ambulatory Visit | Attending: Internal Medicine | Admitting: Internal Medicine

## 2017-03-23 ENCOUNTER — Ambulatory Visit (HOSPITAL_COMMUNITY)
Admission: RE | Admit: 2017-03-23 | Discharge: 2017-03-23 | Disposition: A | Discharge status: Home / Self Care | Payer: 59 | Source: Ambulatory Visit | Attending: Family Medicine | Admitting: Family Medicine

## 2017-03-23 ENCOUNTER — Ambulatory Visit (INDEPENDENT_AMBULATORY_CARE_PROVIDER_SITE_OTHER): Payer: 59 | Admitting: Infectious Disease

## 2017-03-23 VITALS — BP 138/76 | HR 127 | Temp 98.7°F | Wt 194.0 lb

## 2017-03-23 DIAGNOSIS — B20 Human immunodeficiency virus [HIV] disease: Secondary | ICD-10-CM

## 2017-03-23 DIAGNOSIS — R51 Headache: Secondary | ICD-10-CM | POA: Insufficient documentation

## 2017-03-23 DIAGNOSIS — I2583 Coronary atherosclerosis due to lipid rich plaque: Secondary | ICD-10-CM | POA: Diagnosis not present

## 2017-03-23 DIAGNOSIS — E1010 Type 1 diabetes mellitus with ketoacidosis without coma: Secondary | ICD-10-CM

## 2017-03-23 DIAGNOSIS — I2510 Atherosclerotic heart disease of native coronary artery without angina pectoris: Secondary | ICD-10-CM

## 2017-03-23 DIAGNOSIS — Z955 Presence of coronary angioplasty implant and graft: Secondary | ICD-10-CM | POA: Insufficient documentation

## 2017-03-23 DIAGNOSIS — R918 Other nonspecific abnormal finding of lung field: Secondary | ICD-10-CM | POA: Diagnosis not present

## 2017-03-23 DIAGNOSIS — Z833 Family history of diabetes mellitus: Secondary | ICD-10-CM | POA: Diagnosis not present

## 2017-03-23 DIAGNOSIS — Z7982 Long term (current) use of aspirin: Secondary | ICD-10-CM | POA: Insufficient documentation

## 2017-03-23 DIAGNOSIS — E860 Dehydration: Secondary | ICD-10-CM | POA: Insufficient documentation

## 2017-03-23 DIAGNOSIS — E119 Type 2 diabetes mellitus without complications: Secondary | ICD-10-CM | POA: Diagnosis not present

## 2017-03-23 DIAGNOSIS — E1165 Type 2 diabetes mellitus with hyperglycemia: Secondary | ICD-10-CM | POA: Diagnosis not present

## 2017-03-23 DIAGNOSIS — Z794 Long term (current) use of insulin: Secondary | ICD-10-CM | POA: Diagnosis not present

## 2017-03-23 DIAGNOSIS — R000 Tachycardia, unspecified: Secondary | ICD-10-CM | POA: Diagnosis not present

## 2017-03-23 DIAGNOSIS — Z5309 Procedure and treatment not carried out because of other contraindication: Secondary | ICD-10-CM | POA: Diagnosis not present

## 2017-03-23 DIAGNOSIS — R5381 Other malaise: Secondary | ICD-10-CM

## 2017-03-23 DIAGNOSIS — E785 Hyperlipidemia, unspecified: Secondary | ICD-10-CM | POA: Insufficient documentation

## 2017-03-23 DIAGNOSIS — R0981 Nasal congestion: Secondary | ICD-10-CM | POA: Insufficient documentation

## 2017-03-23 DIAGNOSIS — E1110 Type 2 diabetes mellitus with ketoacidosis without coma: Principal | ICD-10-CM | POA: Insufficient documentation

## 2017-03-23 DIAGNOSIS — D72829 Elevated white blood cell count, unspecified: Secondary | ICD-10-CM | POA: Diagnosis not present

## 2017-03-23 DIAGNOSIS — Z800 Family history of malignant neoplasm of digestive organs: Secondary | ICD-10-CM

## 2017-03-23 DIAGNOSIS — R42 Dizziness and giddiness: Secondary | ICD-10-CM

## 2017-03-23 DIAGNOSIS — I10 Essential (primary) hypertension: Secondary | ICD-10-CM | POA: Insufficient documentation

## 2017-03-23 DIAGNOSIS — Z1211 Encounter for screening for malignant neoplasm of colon: Secondary | ICD-10-CM

## 2017-03-23 DIAGNOSIS — R1110 Vomiting, unspecified: Secondary | ICD-10-CM | POA: Diagnosis present

## 2017-03-23 DIAGNOSIS — N179 Acute kidney failure, unspecified: Secondary | ICD-10-CM | POA: Diagnosis present

## 2017-03-23 DIAGNOSIS — R1114 Bilious vomiting: Secondary | ICD-10-CM | POA: Diagnosis not present

## 2017-03-23 DIAGNOSIS — Z79899 Other long term (current) drug therapy: Secondary | ICD-10-CM | POA: Insufficient documentation

## 2017-03-23 DIAGNOSIS — E1159 Type 2 diabetes mellitus with other circulatory complications: Secondary | ICD-10-CM

## 2017-03-23 DIAGNOSIS — R112 Nausea with vomiting, unspecified: Secondary | ICD-10-CM

## 2017-03-23 HISTORY — DX: Nausea with vomiting, unspecified: R11.2

## 2017-03-23 HISTORY — DX: Personal history of urinary calculi: Z87.442

## 2017-03-23 HISTORY — DX: Type 2 diabetes mellitus with ketoacidosis without coma: E11.10

## 2017-03-23 LAB — CBC WITH DIFFERENTIAL/PLATELET
Basophils Absolute: 0 10*3/uL (ref 0.0–0.1)
Basophils Relative: 0 %
Eosinophils Absolute: 0 10*3/uL (ref 0.0–0.7)
Eosinophils Relative: 0 %
HCT: 46.7 % (ref 39.0–52.0)
Hemoglobin: 15.8 g/dL (ref 13.0–17.0)
Lymphocytes Relative: 18 %
Lymphs Abs: 2 10*3/uL (ref 0.7–4.0)
MCH: 29.3 pg (ref 26.0–34.0)
MCHC: 33.8 g/dL (ref 30.0–36.0)
MCV: 86.6 fL (ref 78.0–100.0)
Monocytes Absolute: 0.3 10*3/uL (ref 0.1–1.0)
Monocytes Relative: 2 %
Neutro Abs: 8.9 10*3/uL — ABNORMAL HIGH (ref 1.7–7.7)
Neutrophils Relative %: 80 %
Platelets: 290 10*3/uL (ref 150–400)
RBC: 5.39 MIL/uL (ref 4.22–5.81)
RDW: 12.4 % (ref 11.5–15.5)
WBC: 11.2 10*3/uL — ABNORMAL HIGH (ref 4.0–10.5)

## 2017-03-23 LAB — GLUCOSE, CAPILLARY
Glucose-Capillary: 496 mg/dL — ABNORMAL HIGH (ref 65–99)
Glucose-Capillary: 463 mg/dL — ABNORMAL HIGH (ref 65–99)
Glucose-Capillary: 406 mg/dL — ABNORMAL HIGH (ref 65–99)
Glucose-Capillary: 297 mg/dL — ABNORMAL HIGH (ref 65–99)
Glucose-Capillary: 225 mg/dL — ABNORMAL HIGH (ref 65–99)

## 2017-03-23 LAB — COMPREHENSIVE METABOLIC PANEL
ALT: 34 U/L (ref 17–63)
AST: 30 U/L (ref 15–41)
Albumin: 4 g/dL (ref 3.5–5.0)
Alkaline Phosphatase: 72 U/L (ref 38–126)
Anion gap: 25 — ABNORMAL HIGH (ref 5–15)
BUN: 20 mg/dL (ref 6–20)
CO2: 15 mmol/L — ABNORMAL LOW (ref 22–32)
Calcium: 10.2 mg/dL (ref 8.9–10.3)
Chloride: 95 mmol/L — ABNORMAL LOW (ref 101–111)
Creatinine, Ser: 1.7 mg/dL — ABNORMAL HIGH (ref 0.61–1.24)
GFR calc Af Amer: 52 mL/min — ABNORMAL LOW (ref >60)
GFR calc non Af Amer: 45 mL/min — ABNORMAL LOW (ref >60)
Glucose, Bld: 519 mg/dL (ref 65–99)
Potassium: 4.5 mmol/L (ref 3.5–5.1)
Sodium: 135 mmol/L (ref 135–145)
Total Bilirubin: 1.8 mg/dL — ABNORMAL HIGH (ref 0.3–1.2)
Total Protein: 7 g/dL (ref 6.5–8.1)

## 2017-03-23 LAB — BASIC METABOLIC PANEL
Anion gap: 17 — ABNORMAL HIGH (ref 5–15)
BUN: 18 mg/dL (ref 6–20)
CO2: 17 mmol/L — ABNORMAL LOW (ref 22–32)
Calcium: 9.1 mg/dL (ref 8.9–10.3)
Chloride: 102 mmol/L (ref 101–111)
Creatinine, Ser: 1.4 mg/dL — ABNORMAL HIGH (ref 0.61–1.24)
GFR calc Af Amer: >60 mL/min (ref >60)
GFR calc non Af Amer: 57 mL/min — ABNORMAL LOW (ref >60)
Glucose, Bld: 374 mg/dL — ABNORMAL HIGH (ref 65–99)
Potassium: 4.1 mmol/L (ref 3.5–5.1)
Sodium: 136 mmol/L (ref 135–145)

## 2017-03-23 LAB — PHOSPHORUS: Phosphorus: 4.6 mg/dL (ref 2.5–4.6)

## 2017-03-23 LAB — URINALYSIS, ROUTINE W REFLEX MICROSCOPIC
Bacteria, UA: NONE SEEN
Bilirubin Urine: NEGATIVE
Glucose, UA: >=500 mg/dL — AB
Hgb urine dipstick: NEGATIVE
Ketones, ur: 80 mg/dL — AB
Leukocytes, UA: NEGATIVE
Nitrite: NEGATIVE
Protein, ur: NEGATIVE mg/dL
Specific Gravity, Urine: 1.028 (ref 1.005–1.030)
pH: 5 (ref 5.0–8.0)

## 2017-03-23 LAB — HEMOGLOBIN A1C
Hgb A1c MFr Bld: 10.6 % — ABNORMAL HIGH (ref 4.8–5.6)
Mean Plasma Glucose: 257.52 mg/dL

## 2017-03-23 LAB — TROPONIN I: Troponin I: <0.03 ng/mL (ref <0.03)

## 2017-03-23 LAB — MAGNESIUM: Magnesium: 1.6 mg/dL — ABNORMAL LOW (ref 1.7–2.4)

## 2017-03-23 LAB — MRSA PCR SCREENING: MRSA by PCR: NEGATIVE

## 2017-03-23 MED ORDER — SODIUM CHLORIDE 0.9 % IV SOLN
INTRAVENOUS | Status: AC
  Administered 2017-03-23: 20:00:00 via INTRAVENOUS

## 2017-03-23 MED ORDER — PROMETHAZINE HCL 25 MG RE SUPP
25.0000 mg | Freq: Four times a day (QID) | RECTAL | Status: DC | PRN
  Filled 2017-03-23: qty 1

## 2017-03-23 MED ORDER — ACETAMINOPHEN 650 MG RE SUPP: 650.0000 mg | Freq: Four times a day (QID) | RECTAL | Status: DC | PRN

## 2017-03-23 MED ORDER — EMTRICITAB-RILPIVIR-TENOFOV AF 200-25-25 MG PO TABS
1.0000 | ORAL_TABLET | Freq: Every day | ORAL | Status: DC
  Administered 2017-03-24: 1 via ORAL
  Filled 2017-03-23: qty 1

## 2017-03-23 MED ORDER — PROMETHAZINE HCL 25 MG PO TABS: 25.0000 mg | ORAL_TABLET | Freq: Four times a day (QID) | ORAL | Status: DC | PRN

## 2017-03-23 MED ORDER — DEXTROSE-NACL 5-0.45 % IV SOLN
INTRAVENOUS | Status: DC
  Administered 2017-03-23: 23:00:00 via INTRAVENOUS

## 2017-03-23 MED ORDER — SODIUM CHLORIDE 0.9 % IV SOLN
INTRAVENOUS | Status: DC
  Administered 2017-03-23: 22:00:00 via INTRAVENOUS

## 2017-03-23 MED ORDER — PROMETHAZINE HCL 25 MG/ML IJ SOLN
12.5000 mg | Freq: Four times a day (QID) | INTRAMUSCULAR | Status: DC | PRN
  Administered 2017-03-23: 12.5 mg via INTRAVENOUS
  Filled 2017-03-23: qty 1

## 2017-03-23 MED ORDER — ROSUVASTATIN CALCIUM 10 MG PO TABS: 40.0000 mg | ORAL_TABLET | Freq: Every day | ORAL | Status: DC

## 2017-03-23 MED ORDER — POTASSIUM CHLORIDE 10 MEQ/100ML IV SOLN
10.0000 meq | INTRAVENOUS | Status: AC
  Administered 2017-03-23 (×2): 10 meq via INTRAVENOUS
  Filled 2017-03-23 (×2): qty 100

## 2017-03-23 MED ORDER — ASPIRIN EC 81 MG PO TBEC
81.0000 mg | DELAYED_RELEASE_TABLET | Freq: Every day | ORAL | Status: DC
  Administered 2017-03-24: 81 mg via ORAL
  Filled 2017-03-23 (×2): qty 1

## 2017-03-23 MED ORDER — ACETAMINOPHEN 325 MG PO TABS
650.0000 mg | ORAL_TABLET | Freq: Four times a day (QID) | ORAL | Status: DC | PRN
  Administered 2017-03-24: 650 mg via ORAL
  Filled 2017-03-23: qty 2

## 2017-03-23 MED ORDER — SODIUM CHLORIDE 0.9 % IV SOLN
INTRAVENOUS | Status: DC
  Administered 2017-03-23: 4 [IU]/h via INTRAVENOUS
  Filled 2017-03-23: qty 1

## 2017-03-23 MED ORDER — METOPROLOL SUCCINATE ER 100 MG PO TB24
100.0000 mg | ORAL_TABLET | Freq: Every day | ORAL | Status: DC
  Administered 2017-03-24: 100 mg via ORAL
  Filled 2017-03-23: qty 1

## 2017-03-23 MED ORDER — ENOXAPARIN SODIUM 30 MG/0.3ML ~~LOC~~ SOLN
30.0000 mg | SUBCUTANEOUS | Status: DC
  Administered 2017-03-23: 30 mg via SUBCUTANEOUS
  Filled 2017-03-23: qty 0.3

## 2017-03-23 MED ORDER — POTASSIUM CHLORIDE 10 MEQ/100ML IV SOLN: 10.0000 meq | INTRAVENOUS | Status: DC

## 2017-03-23 NOTE — H&P (Signed)
History and Physical  Patient Name: [name]     TDD:[id number]    DOB: 1966-04-22    DOA: 03/23/2017 PCP: Asencion Noble, MD  Patient coming from: ID clinic  Chief Complaint: Fruity breath, malaise, vomiting      HPI: [name] is a 51 y.o. male with a past medical history significant for HIV well controlled, IDDM, HTN and CAD s/p PCI x2 in 2009 who presents with vomiting and fruity breath.  Patient was in his usual state of health until the last few days.  He has a colonoscopy tomorrow, and so he has been eating liquids only, and started yesterday to feel vague malaise, "like I was coming down with a cold".  Then this morning, he had a couple episodes of vomiting, the mall has progressed, and he felt pretty taste in his mouth, and noticed extreme thirst and decreased U OP.  He thought these were similar to his last episode of DKA 5 years ago.   He went to ID clinic where he was recognized to be in DKA. -Afebrile, heart rate 127, blood pressure 138/76 -Na 135, K 4.5, Cr 1.7 (baseline 0.8), WBC 11.2K, Hgb 15.8 -EKG showed sinus tach, rate 120s, T wave changes are stable from previous -The patient was paged to try it as a direct admission for DKA  He has had no recent exertional chest pain.  He has had no recent fever, chills, sputum production, cough, dysuria, hematuria.     ROS: Review of Systems  Constitutional: Positive for malaise/fatigue.  Respiratory: Negative for cough, sputum production and shortness of breath.   Cardiovascular: Negative for chest pain.  Gastrointestinal: Positive for nausea and vomiting. Negative for abdominal pain and diarrhea.  Genitourinary: Negative for dysuria, frequency, hematuria and urgency.  Neurological: Positive for weakness.  All other systems reviewed and are negative.         Past Medical History:  Diagnosis Date  . CAD (coronary artery disease)    DES.  /  ... Later (10/2007) intervention for in-stent restenosis ;  EF  60%... catheterization... June, 2009  . Diabetes mellitus type 2, uncontrolled ([place]) 09/27/2014  . Diabetes mellitus type II   . Dyslipidemia   . Ejection fraction    EF 60%, catheterization, 2009  . HIV (human immunodeficiency virus infection) ([place])   . HTN (hypertension)   . Kidney calculi   . Kidney stone   . 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  . Nausea and vomiting 03/23/2017  . 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 RPR  reverted to negative  . Sinus tachycardia    mild at rest.... October, 2010  . Syphilitic hepatitis     Past Surgical History:  Procedure Laterality Date  . CHOLECYSTECTOMY    . CORONARY STENT PLACEMENT      Social History: Patient lives alone.  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  The patient walks unassistd.  Nonsmoker.  No Known Allergies  Family history: family history includes Anemia in his unknown relative; CAD in his sister; Diabetes in his sister and unknown relative; Heart attack in his father.  Prior to Admission medications   Medication Sig Start Date End Date Taking? Authorizing Provider  aspirin 81 MG tablet Take 81 mg by mouth daily.     Yes [provider]  chlorthalidone (HYGROTON) 25 MG tablet Take 1 tablet (25 mg total) by mouth daily. 10/16/15  Yes Dorothy Spark, MD  insulin aspart (NOVOLOG) 100 UNIT/ML injection Inject 8 Units into the skin 3 (three) times daily with meals.   Yes [provider]  insulin glargine (LANTUS SOLOSTAR) 100 UNIT/ML injection Inject 40 Units into the skin 2 (two) times daily.    Yes [provider]  lisinopril (PRINIVIL,ZESTRIL) 10 MG tablet Take 1 tablet (10 mg total) by mouth daily. 10/16/15  Yes Dorothy Spark, MD  metoprolol (TOPROL-XL) 100 MG 24 hr tablet Take 1 tablet (100 mg total) by mouth daily. 01/05/11  Yes Asencion Noble, MD  ODEFSEY 200-25-25 MG TABS tablet  TAKE 1 TABLET BY MOUTH DAILY WITH BREAKFAST. 03/01/17  Yes Tommy Medal, Lavell Islam, MD  omega-3 acid ethyl esters (LOVAZA) 1 g capsule Take 2 capsules (2 g total) by mouth 2 (two) times daily. 10/14/15  Yes Dorothy Spark, MD  rosuvastatin (CRESTOR) 40 MG tablet Take 1 tablet (40 mg total) by mouth daily. 10/16/15  Yes Dorothy Spark, MD       Physical Exam: BP (!) 147/82 (BP Location: Right Arm)   Pulse (!) 141   Temp 98.1 F (36.7 C) (Oral)   Resp 18   Ht 5\' 6"  (1.676 m)   Wt 87.1 kg (192 lb 1.6 oz)   SpO2 95%   BMI 31.01 kg/m  General appearance: Well-developed, adult male, alert and in no acute distress.   Eyes: Anicteric, conjunctiva pink, lids and lashes normal. PERRL.    ENT: No nasal deformity, discharge, epistaxis.  Hearing normal. OP dry without lesions.   Skin: Warm and dry.  No jaundice.  No suspicious rashes or lesions. Cardiac: Tachycardic, regular, nl S1-S2, no murmurs appreciated.  Capillary refill is brisk.  JVP not visible.  No LE edema.  Radial and DP pulses 2+ and symmetric. Respiratory: Normal respiratory rate and rhythm.  CTAB without rales or wheezes. Abdomen: Abdomen soft.  No TTP. No ascites, distension, hepatosplenomegaly.   MSK: No deformities or effusions.  No cyanosis or clubbing. Neuro: Cranial nerves normal.  Sensation intact to light touch. Speech is fluent.  Muscle strength normal.    Psych: Sensorium intact and responding to questions, attention normal.  Behavior appropriate.  Affect normal.  Judgment and insight appear normal.     Labs on Admission:  I have personally reviewed following labs and imaging studies: CBC:  Recent Labs Lab 03/23/17 1524  WBC 11.2*  NEUTROABS 8.9*  HGB 15.8  HCT 46.7  MCV 86.6  PLT 476   Basic Metabolic Panel:  Recent Labs Lab 03/23/17 1524  NA 135  K 4.5  CL 95*  CO2 15*  GLUCOSE 519*  BUN 20  CREATININE 1.70*  CALCIUM 10.2   GFR: Estimated Creatinine Clearance: 53.2 mL/min (A) (by C-G  formula based on SCr of 1.7 mg/dL (H)).  Liver Function Tests:  Recent Labs Lab 03/23/17 1524  AST 30  ALT 34  ALKPHOS 72  BILITOT 1.8*  PROT 7.0  ALBUMIN 4.0   No results for input(s): LIPASE, AMYLASE in the last 168 hours. No results for input(s): AMMONIA in the last 168 hours. Coagulation Profile: No results for input(s): INR, PROTIME in the last 168 hours. Cardiac Enzymes: No results for input(s): CKTOTAL, CKMB, CKMBINDEX, TROPONINI in the last 168 hours. BNP (last 3 results) No results for input(s): PROBNP in the last 8760 hours. HbA1C: No results for input(s): HGBA1C in the last 72 hours. CBG:  Recent Labs Lab 03/23/17 1759  GLUCAP 496*  Lipid Profile: No results for input(s): CHOL, HDL, LDLCALC, TRIG, CHOLHDL, LDLDIRECT in the last 72 hours. Thyroid Function Tests: No results for input(s): TSH, T4TOTAL, FREET4, T3FREE, THYROIDAB in the last 72 hours. Anemia Panel: No results for input(s): VITAMINB12, FOLATE, FERRITIN, TIBC, IRON, RETICCTPCT in the last 72 hours. Sepsis Labs: Invalid input(s): PROCALCITONIN, LACTICIDVEN No results found for this or any previous visit (from the past 240 hour(s)).       EKG: Independently reviewed. Rate 120s, TW changes in inferior and V1 are old.  Poor baseline.  No ST changes.  Sinus.  Echocardiogram 2013: Report reviewed EF 55-60% Grade II DD        Assessment/Plan  1.  Diabetic ketoacidosis:  Likely precipitant is change in intake for colonoscopy.  Will rule out infection, ACS.   -Bolus 2 L now -Start IV insulin per protocol -Check BMP every 4  -Check mag and replete as needed -Replete K -Home insulin regimen is Lantus 40 twice daily and NovoLog 8 3 times daily with sliding scale  -Check chest x-ray, urinalysis and urine culture -Obtain blood culture -Check troponin   2.  Acute kidney injury:  From dehydration. -IV fluids and trend BMP  3.  HIV disease:  -Continue ART  4.  Coronary disease  secondary prevention, hypertension:  -Hold chlorthalidone and ACE inhibitor given AK I -Continue metoprolol -Continue statin, aspirin       DVT prophylaxis: Lovenox, low dose  Code Status: FULL  Family Communication: None present  Disposition Plan: Anticipate IV fluids and insulin, close monitoring electrolytes and repletion as needed, likely transition back to SubQ in AM. Consults called: None Admission status: INPATIENT   Medical decision making: Patient seen at 6:26 PM on 03/23/2017.  What exists of the patient's chart was reviewed in depth and summarized above.  Clinical condition: stable.        Edwin Dada [facility] Pager [phone]      At the time of admission, it appears that the appropriate admission status for this patient is INPATIENT. This is judged to be reasonable and necessary in order to provide the required intensity of service to ensure the patient's safety given the presenting symptoms, physical exam findings, and initial radiographic and laboratory data in the context of their chronic comorbidities.  Together, these circumstances are felt to place him at high risk for further clinical deterioration threatening life, limb, or organ.   Patient requires inpatient status due to high intensity of service, high risk for further deterioration and high frequency of surveillance required because of this acute illness that poses a threat to life, limb or bodily function.  I certify that at the point of admission it is my clinical judgment that the patient will require inpatient hospital care spanning beyond 2 midnights from the point of admission and that early discharge would result in unnecessary risk of decompensation and readmission or threat to life, limb or bodily function.

## 2017-03-23 NOTE — Progress Notes (Signed)
    Spoke with Dr. Tommy Medal regarding direct admission. Patient with hx well-contr olled HIV followed up with ID clinic this afternoon. He has been eating less due to prepping for outpatient colonoscopy which is scheduled for tomorrow. He has had malaise and overall not feeling well, feels like he may be in early DKA. Most recent CBG was around 250. Stat labs obtained in office which are pending at this time. EKG with sinus tachycardia. BP 138/76, HR 120. On brief review of home meds, he is taking lantus 60u BID and humalog on sliding scale. Patient accepted to stepdown bed, will need IVF and insulin drip.   RN: please call [place] flow manager at [phone] once patient arrives on unit for admission orders.   Dessa Phi, DO [facility] [email or web address] Password [email or web address] 03/23/2017, 4:33 PM

## 2017-03-23 NOTE — Progress Notes (Signed)
Patient ID: [name], male    DOB: 09/09/1965, 51 y.o.   MRN: [id number]  Chief complaint: nausea with bilious vomiting, light headedness dizziness       50 year old Caucasian man with HIV perfectly controlled with ODefsey .He does have comorbid CAD, DM, difficult to control hyperlipidemia, HTN.  [name] is here for routine followup but not feeling well at all.  He began feeling poorly yesterday with malaise. He has been eating less due to prep for colonoscopy scheduled tomorrow in [place]. In the meantime he has progressed in feeling worse and had episode of bilious vomiting this am.   He denies chest pain. He says that he has fruity taste in his mouth like "juicy fruit gum" which he has had previously prior to uncontrolled DM and DKA. He has had reduced urine output.  He denies chest pain or fevers. He has had some congestion. No GERD.  He has had dizziness and difficulty putting one foot in front of the other feeling unsteady and light headed.      Lab Results  Component Value Date   HIV1RNAQUANT 34 (H) 11/30/2016    Lab Results  Component Value Date   CD4TABS 1,350 11/30/2016   CD4TABS 980 10/28/2015   CD4TABS 1,470 09/13/2014   Past Medical History:  Diagnosis Date  . CAD (coronary artery disease)    DES.  /  ... Later (10/2007) intervention for in-stent restenosis ;  EF 60%... catheterization... June, 2009  . Diabetes mellitus type 2, uncontrolled ([place]) 09/27/2014  . Diabetes mellitus type II   . Dyslipidemia   . Ejection fraction    EF 60%, catheterization, 2009  . HIV (human immunodeficiency virus infection) ([place])   . HTN (hypertension)   . Kidney calculi   . Kidney stone   . 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  . 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 RPR  reverted to negative  . Sinus tachycardia    mild at rest.... October, 2010  .  Syphilitic hepatitis     Past Surgical History:  Procedure Laterality Date  . CHOLECYSTECTOMY    . CORONARY STENT PLACEMENT      Family History  Problem Relation Age of Onset  . Anemia Unknown   . Diabetes Unknown       Social History   Social History  . Marital status: Single    Spouse name: N/A  . Number of children: N/A  . Years of education: N/A   Social History Main Topics  . Smoking status: Never Smoker  . Smokeless tobacco: Never Used  . Alcohol use 0.0 - 0.6 oz/week     Comment: socially  . Drug use: No  . Sexual activity: Not Asked   Other Topics Concern  . None   Social History Narrative  . None    No Known Allergies   Current Outpatient Prescriptions:  .  aspirin 81 MG tablet, Take 81 mg by mouth daily.  , Disp: , Rfl:  .  chlorthalidone (HYGROTON) 25 MG tablet, Take 1 tablet (25 mg total) by mouth daily., Disp: 90 tablet, Rfl: 3 .  HUMALOG KWIKPEN 100 UNIT/ML KiwkPen, Inject 10-15 Units into the skin 3 (three) times daily. Per sliding scale, Disp: , Rfl:  .  insulin glargine (LANTUS SOLOSTAR) 100 UNIT/ML injection, Inject 60 Units into the skin 2 (two) times daily. , Disp: , Rfl:  .  lisinopril (  PRINIVIL,ZESTRIL) 10 MG tablet, Take 1 tablet (10 mg total) by mouth daily., Disp: 90 tablet, Rfl: 3 .  metoprolol (TOPROL-XL) 100 MG 24 hr tablet, Take 1 tablet (100 mg total) by mouth daily., Disp: 90 tablet, Rfl: 3 .  ODEFSEY 200-25-25 MG TABS tablet, TAKE 1 TABLET BY MOUTH DAILY WITH BREAKFAST., Disp: 30 tablet, Rfl: 1 .  omega-3 acid ethyl esters (LOVAZA) 1 g capsule, Take 2 capsules (2 g total) by mouth 2 (two) times daily., Disp: 360 capsule, Rfl: 11 .  rosuvastatin (CRESTOR) 40 MG tablet, Take 1 tablet (40 mg total) by mouth daily., Disp: 90 tablet, Rfl: 3 .  traZODone (DESYREL) 100 MG tablet, Take 100 mg by mouth at bedtime as needed for sleep. , Disp: , Rfl:     Review of Systems  Constitutional: Positive for appetite change, chills, diaphoresis  and fatigue. Negative for activity change and unexpected weight change.  HENT: Positive for congestion, rhinorrhea and sneezing. Negative for sinus pressure, sore throat and trouble swallowing.   Eyes: Negative for photophobia and visual disturbance.  Respiratory: Negative for cough, chest tightness, shortness of breath, wheezing and stridor.   Cardiovascular: Negative for chest pain, palpitations and leg swelling.  Gastrointestinal: Positive for abdominal pain, nausea and vomiting. Negative for abdominal distention, anal bleeding, blood in stool, constipation and diarrhea.  Endocrine: Negative for polydipsia, polyphagia and polyuria.  Genitourinary: Positive for difficulty urinating. Negative for dysuria, flank pain and hematuria.  Musculoskeletal: Negative for arthralgias, back pain, gait problem, joint swelling and myalgias.  Skin: Negative for color change, pallor, rash and wound.  Neurological: Positive for dizziness, weakness and light-headedness. Negative for tremors.  Hematological: Negative for adenopathy. Does not bruise/bleed easily.  Psychiatric/Behavioral: Negative for agitation, behavioral problems, confusion, decreased concentration, dysphoric mood and sleep disturbance.       Objective:   Physical Exam  Constitutional: He is oriented to person, place, and time. He appears well-developed. No distress.  Pale and anxious  HENT:  Head: Normocephalic and atraumatic.  Mouth/Throat: Oropharynx is clear and moist. No oropharyngeal exudate.  Eyes: Pupils are equal, round, and reactive to light. Conjunctivae and EOM are normal. No scleral icterus.  Neck: Normal range of motion. Neck supple. No JVD present.  Cardiovascular: Normal rate, regular rhythm and normal heart sounds.  Exam reveals no gallop and no friction rub.   No murmur heard. Pulmonary/Chest: Effort normal and breath sounds normal. No respiratory distress. He has no wheezes. He has no rales. He exhibits no tenderness.   Abdominal: Soft. Bowel sounds are normal. He exhibits no distension and no mass. There is no tenderness. There is no rebound and no guarding.  Musculoskeletal: He exhibits no edema or tenderness.  Lymphadenopathy:    He has no cervical adenopathy.  Neurological: He is alert and oriented to person, place, and time. He has normal reflexes. He exhibits normal muscle tone. Coordination normal.  Skin: Skin is warm and dry. He is not diaphoretic. No erythema. No pallor.  Psychiatric: His behavior is normal. Judgment and thought content normal. His mood appears anxious.  Nursing note and vitals reviewed.         Assessment & Plan:   Nausea vomiting, malaise with fruity taste in his mouth and BG over 250 when last checked. He states he is worried he could be going into DKA and may need to be hospitalized  I will get stat EKG, stat CMP, CBC c diff   Likely DKA: labs still coming back and Bicarb is  15, We are trying to place him in stepdown for insulin drip we believe he will need  QZE:SPQZRAQTM controlled with ODEFSEY but has to take with food and avoid antacids, PPI, H2 blockers. May want to switch him to [facility] that he can take with or without food and no restrictions on antacids  Leukocytosis: could have infection brewing. Would recommend getting set of blood cultures and CXR 2 V on admission  DM: concern by pt he may be going into DKA see above  Hyperlipidemia: "" on statin   CAD: on beta blocker, acei, statin, asa, I was going to add troponion but I cannot run it fast out of my lab and would defer to inpatient team  Syphliitic hepatitis: resolved  I spent greater than 40 minutes with the patient including greater than 50% of time in face to face counsel of the patient and in coordination of his care including time spent evaluating and monitoring his condition, coordination of his care with admitting physician Dr Maylene Roes including admission tot he stepdown unit for possible insulin  drip.  I read his EKG personally and it shows Sinus Tachycardia without ST wave or T w changes (he had prior T w inversion in V1

## 2017-03-24 ENCOUNTER — Encounter (HOSPITAL_COMMUNITY)
Admission: AD | Disposition: A | Discharge status: Home / Self Care | Payer: Self-pay | Source: Ambulatory Visit | Attending: Internal Medicine

## 2017-03-24 ENCOUNTER — Ambulatory Visit (HOSPITAL_COMMUNITY): Admission: RE | Admit: 2017-03-24 | Payer: 59 | Source: Ambulatory Visit | Admitting: Internal Medicine

## 2017-03-24 DIAGNOSIS — E119 Type 2 diabetes mellitus without complications: Secondary | ICD-10-CM | POA: Diagnosis not present

## 2017-03-24 DIAGNOSIS — Z1211 Encounter for screening for malignant neoplasm of colon: Secondary | ICD-10-CM

## 2017-03-24 DIAGNOSIS — B20 Human immunodeficiency virus [HIV] disease: Secondary | ICD-10-CM | POA: Diagnosis not present

## 2017-03-24 DIAGNOSIS — I10 Essential (primary) hypertension: Secondary | ICD-10-CM | POA: Diagnosis not present

## 2017-03-24 DIAGNOSIS — E860 Dehydration: Secondary | ICD-10-CM | POA: Diagnosis not present

## 2017-03-24 DIAGNOSIS — Z5309 Procedure and treatment not carried out because of other contraindication: Secondary | ICD-10-CM | POA: Diagnosis not present

## 2017-03-24 DIAGNOSIS — Z800 Family history of malignant neoplasm of digestive organs: Secondary | ICD-10-CM

## 2017-03-24 DIAGNOSIS — Z794 Long term (current) use of insulin: Secondary | ICD-10-CM

## 2017-03-24 DIAGNOSIS — I2510 Atherosclerotic heart disease of native coronary artery without angina pectoris: Secondary | ICD-10-CM

## 2017-03-24 DIAGNOSIS — E1110 Type 2 diabetes mellitus with ketoacidosis without coma: Secondary | ICD-10-CM | POA: Diagnosis not present

## 2017-03-24 DIAGNOSIS — N179 Acute kidney failure, unspecified: Secondary | ICD-10-CM | POA: Diagnosis not present

## 2017-03-24 DIAGNOSIS — R0981 Nasal congestion: Secondary | ICD-10-CM | POA: Diagnosis not present

## 2017-03-24 DIAGNOSIS — I2583 Coronary atherosclerosis due to lipid rich plaque: Secondary | ICD-10-CM | POA: Diagnosis not present

## 2017-03-24 DIAGNOSIS — R51 Headache: Secondary | ICD-10-CM | POA: Diagnosis not present

## 2017-03-24 LAB — GLUCOSE, CAPILLARY
Glucose-Capillary: 132 mg/dL — ABNORMAL HIGH (ref 65–99)
Glucose-Capillary: 144 mg/dL — ABNORMAL HIGH (ref 65–99)
Glucose-Capillary: 158 mg/dL — ABNORMAL HIGH (ref 65–99)
Glucose-Capillary: 161 mg/dL — ABNORMAL HIGH (ref 65–99)
Glucose-Capillary: 163 mg/dL — ABNORMAL HIGH (ref 65–99)
Glucose-Capillary: 166 mg/dL — ABNORMAL HIGH (ref 65–99)
Glucose-Capillary: 169 mg/dL — ABNORMAL HIGH (ref 65–99)
Glucose-Capillary: 177 mg/dL — ABNORMAL HIGH (ref 65–99)
Glucose-Capillary: 189 mg/dL — ABNORMAL HIGH (ref 65–99)
Glucose-Capillary: 235 mg/dL — ABNORMAL HIGH (ref 65–99)
Glucose-Capillary: 242 mg/dL — ABNORMAL HIGH (ref 65–99)
Glucose-Capillary: 344 mg/dL — ABNORMAL HIGH (ref 65–99)
Glucose-Capillary: 64 mg/dL — ABNORMAL LOW (ref 65–99)

## 2017-03-24 LAB — BASIC METABOLIC PANEL
Anion gap: 9 (ref 5–15)
Anion gap: 10 (ref 5–15)
BUN: 17 mg/dL (ref 6–20)
BUN: 15 mg/dL (ref 6–20)
CO2: 22 mmol/L (ref 22–32)
CO2: 21 mmol/L — ABNORMAL LOW (ref 22–32)
Calcium: 8.6 mg/dL — ABNORMAL LOW (ref 8.9–10.3)
Calcium: 8.5 mg/dL — ABNORMAL LOW (ref 8.9–10.3)
Chloride: 108 mmol/L (ref 101–111)
Chloride: 106 mmol/L (ref 101–111)
Creatinine, Ser: 0.89 mg/dL (ref 0.61–1.24)
Creatinine, Ser: 0.89 mg/dL (ref 0.61–1.24)
GFR calc Af Amer: >60 mL/min (ref >60)
GFR calc Af Amer: >60 mL/min (ref >60)
GFR calc non Af Amer: >60 mL/min (ref >60)
GFR calc non Af Amer: >60 mL/min (ref >60)
Glucose, Bld: 159 mg/dL — ABNORMAL HIGH (ref 65–99)
Glucose, Bld: 155 mg/dL — ABNORMAL HIGH (ref 65–99)
Potassium: 3.2 mmol/L — ABNORMAL LOW (ref 3.5–5.1)
Potassium: 3.5 mmol/L (ref 3.5–5.1)
Sodium: 139 mmol/L (ref 135–145)
Sodium: 137 mmol/L (ref 135–145)

## 2017-03-24 SURGERY — COLONOSCOPY
Anesthesia: Moderate Sedation

## 2017-03-24 MED ORDER — FLUTICASONE PROPIONATE 50 MCG/ACT NA SUSP
1.0000 | Freq: Every day | NASAL | Status: DC
  Filled 2017-03-24: qty 16

## 2017-03-24 MED ORDER — FLUTICASONE PROPIONATE 50 MCG/ACT NA SUSP: 1.0000 | Freq: Every day | NASAL | 2 refills | Status: DC

## 2017-03-24 MED ORDER — INSULIN ASPART 100 UNIT/ML ~~LOC~~ SOLN: 0.0000 [IU] | Freq: Every day | SUBCUTANEOUS | Status: DC

## 2017-03-24 MED ORDER — BUTALBITAL-APAP-CAFFEINE 50-325-40 MG PO TABS: 1.0000 | ORAL_TABLET | ORAL | 0 refills | Status: DC | PRN

## 2017-03-24 MED ORDER — POTASSIUM CHLORIDE 10 MEQ/100ML IV SOLN
10.0000 meq | INTRAVENOUS | Status: AC
  Administered 2017-03-24 (×3): 10 meq via INTRAVENOUS
  Filled 2017-03-24 (×3): qty 100

## 2017-03-24 MED ORDER — INSULIN GLARGINE 100 UNIT/ML ~~LOC~~ SOLN: 40.0000 [IU] | Freq: Every day | SUBCUTANEOUS | Status: DC

## 2017-03-24 MED ORDER — BUTALBITAL-APAP-CAFFEINE 50-325-40 MG PO TABS: 1.0000 | ORAL_TABLET | ORAL | Status: DC | PRN

## 2017-03-24 MED ORDER — INSULIN ASPART 100 UNIT/ML ~~LOC~~ SOLN
0.0000 [IU] | Freq: Three times a day (TID) | SUBCUTANEOUS | Status: DC
  Administered 2017-03-24: 7 [IU] via SUBCUTANEOUS
  Administered 2017-03-24: 3 [IU] via SUBCUTANEOUS

## 2017-03-24 MED ORDER — CONTINUOUS BLOOD GLUC SENSOR MISC: 1.0000 | 0 refills | Status: DC

## 2017-03-24 MED ORDER — INSULIN ASPART 100 UNIT/ML ~~LOC~~ SOLN
10.0000 [IU] | Freq: Once | SUBCUTANEOUS | Status: AC
  Administered 2017-03-24: 10 [IU] via SUBCUTANEOUS

## 2017-03-24 MED ORDER — ENOXAPARIN SODIUM 40 MG/0.4ML ~~LOC~~ SOLN: 40.0000 mg | SUBCUTANEOUS | Status: DC

## 2017-03-24 MED ORDER — INSULIN GLARGINE 100 UNIT/ML ~~LOC~~ SOLN
30.0000 [IU] | Freq: Once | SUBCUTANEOUS | Status: AC
  Administered 2017-03-24: 30 [IU] via SUBCUTANEOUS
  Filled 2017-03-24: qty 0.3

## 2017-03-24 MED FILL — FLUTICASONE PROP 50 MCG SPR: 50 | 60 days supply | Qty: 16 | Fill #0

## 2017-03-24 NOTE — Progress Notes (Signed)
[name] to be D/C'd home per MD order.  Discussed with the patient and all questions fully answered.  VSS, Skin clean, dry and intact without evidence of skin break down, no evidence of skin tears noted. IV catheter discontinued intact. Site without signs and symptoms of complications. Dressing and pressure applied.  An After Visit Summary was printed and given to the patient. Patient received prescription.  D/c education completed with patient/family including follow up instructions, medication list, d/c activities limitations if indicated, with other d/c instructions as indicated by MD - patient able to verbalize understanding, all questions fully answered.   Patient instructed to return to ED, call 911, or call MD for any changes in condition.   Patient escorted via [place], and D/C home via private auto.  Milas Hock 03/24/2017 6:22 PM

## 2017-03-24 NOTE — Progress Notes (Signed)
Patient has signed and been given copy of consent for CGM/Freestyle [place] research study. MD also notified.  Education done regarding application and changing CGM sensor (alternate every 10 days on back of arms), 12 hour warm-up, use of glucometer/where to buy strips, how to scan CGM for glucose reading and information for PCP. Patient has been given [employer] reader and first sensor for use. Patient has also been given educational packet regarding use CGM sensor including the 1-800 toll free number for any questions, problems or needs related to the [facility] sensors or reader.  Patient to be given Rx. For sensors with prescriptions/discharge paper work.  Sensor applied by patient to ( L) Arm at (3:45 pm).  Explained that glucose readings will not be available until 12 hours after application. Patient understands that Diabetes coordinator will call them 2 times after discharge (between days 7-12 after d/c from hospital and between days 30-25 after d/c from hospital). Patient verbalizes understanding of use of Freestyle [place] CGM and was told that any issues with blood sugars/diabetes will need to be addressed by PCP.  Diabetes Quality of Life Survey administered to patient.   Thank you, Nani Gasser. Hanks, RN, MSN, CDE  Diabetes Coordinator Inpatient Glycemic Control Team Team Pager [phone] (8am-5pm) 03/24/2017 3:47 PM

## 2017-03-24 NOTE — Progress Notes (Addendum)
Inpatient Diabetes Program Recommendations  AACE/ADA: New Consensus Statement on Inpatient Glycemic Control (2015)  Target Ranges:  Prepandial:   less than 140 mg/dL      Peak postprandial:   less than 180 mg/dL (1-2 hours)      Critically ill patients:  140 - 180 mg/dL   Results for [name], [name] (MRN [id number]) as of 03/24/2017 11:30  Ref. Range 03/23/2017 22:10 03/23/2017 23:16 03/24/2017 00:18 03/24/2017 01:21 03/24/2017 02:21 03/24/2017 03:20 03/24/2017 04:24 03/24/2017 05:28 03/24/2017 06:24 03/24/2017 07:29 03/24/2017 08:29 03/24/2017 10:05  Glucose-Capillary Latest Ref Range: 65 - 99 mg/dL 297 (H) 225 (H) 189 (H) 158 (H) 132 (H) 166 (H) 169 (H) 163 (H) 144 (H) 177 (H) 161 (H) 242 (H)   Review of Glycemic Control  Diabetes history: DM2 Outpatient Diabetes medications: Lantus 40 units BID, Novolog 8 units TID with meals Current orders for Inpatient glycemic control: Novolog 0-9 units TID with meals, Novolog 0-5 units QHS  Inpatient Diabetes Program Recommendations: Insulin - Basal: Patient was given one time order of Lantus 30 units at 7:01 am today and glucose up to 242 mg/dl at 10:05 am. Please consider ordering Lantus 40 units daily starting tomorrow. Insulin - Meal Coverage: Please consider ordering Novolog 6 units TID with meals for meal coverage if patient eats at least 50% of meals. HgbA1C: A1C 10.3% on 03/23/17 indicating an average glucose of 258 mg/dl over the past 2-3 months.  Thanks, Barnie Alderman, RN, MSN, CDE Diabetes Coordinator Inpatient Diabetes Program [phone] (Team Pager from 8am to 5pm)

## 2017-03-24 NOTE — Plan of Care (Signed)
Problem: Physical Regulation: Goal: Ability to maintain clinical measurements within normal limits will improve Outcome: Progressing Patient maintained on insulin drip throughout shift. IV fluids transitioned per protocol. Last 3 CBG checks within 160's. Anion gap 9. Venous Co2 22. [facility] paged. Order received for Lantus 30 units and to repage provider 2 hours after Lantus administered for further transition instructions. Awaiting Lantus from Pharmacy.   Phenergan given x1 PRN last night after episode of emesis. No futher episodes of emesis, slept throughout shift.

## 2017-03-24 NOTE — Discharge Summary (Signed)
Physician Discharge Summary  [name] IRW:[id number] DOB: [birth year] DOA: 03/23/2017  PCP: Asencion Noble, MD  Admit date: 03/23/2017 Discharge date: 03/24/2017  Time spent: 45 minutes  Recommendations for Outpatient Follow-up:  Patient will be discharged to home.  Patient will need to follow up with primary care provider within one week of discharge.  Patient should continue medications as prescribed.  Patient should follow a carb modified/heart healthy diet.   Discharge Diagnoses:  Diabetic ketoacidosis/diabetes mellitus, type II Acute kidney injury HIV disease Coronary artery disease/hypertension Headache Nasal congestion  Discharge Condition: Stable  Diet recommendation: carb modified/heart healthy diet.   Filed Weights   03/23/17 1753 03/24/17 0321  Weight: 87.1 kg (192 lb 1.6 oz) 87.3 kg (192 lb 8 oz)    History of present illness:  On 03/23/2017 by Dr. Myrene Buddy Kedrick P Lascola is a 51 y.o. male with a past medical history significant for HIV well controlled, IDDM, HTN and CAD s/p PCI x2 in 2009 who presents with vomiting and fruity breath.  Patient was in his usual state of health until the last few days.  He has a colonoscopy tomorrow, and so he has been eating liquids only, and started yesterday to feel vague malaise, "like I was coming down with a cold".  Then this morning, he had a couple episodes of vomiting, the mall has progressed, and he felt pretty taste in his mouth, and noticed extreme thirst and decreased U OP.  He thought these were similar to his last episode of DKA 5 years ago.   He went to ID clinic where he was recognized to be in DKA. -Afebrile, heart rate 127, blood pressure 138/76 -Na 135, K 4.5, Cr 1.7 (baseline 0.8), WBC 11.2K, Hgb 15.8 -EKG showed sinus tach, rate 120s, T wave changes are stable from previous -The patient was paged to try it as a direct admission for DKA  He has had no recent exertional chest pain.  He has had  no recent fever, chills, sputum production, cough, dysuria, hematuria.  Hospital Course:  Diabetic ketoacidosis/diabetes mellitus, type II -Resolved -Possibly precipitated by viral etiology -UA and cet ay unremare for ineco -Patient no longer having nausea or vomiting-felt he had a cold prior to admission -Patient was placed on IV insulin and has been transitioned over to Lantus and insulin sliding scale -Diabetes coordinator consulted and appreciated- patient is going to enroll in [employer] research study -Continue home regimen on discharge  Acute kidney injury -Creatinine 1.7 on admission, currently 0.89 -Resolved, will repeat BMP in 1 week  HIV disease -Continue ART, follow-up with infectious disease  Coronary artery disease/hypertension -Chlorthalidone and ACE inhibitor held due to kidney injury-may restart on discharge -Continue metoprolol, statin, aspirin  Headache -Continue pain control  Nasal congestion -Possible viral etiology, continue Flonase  Procedures: None  Consultations: None  Discharge Exam: Vitals:   03/24/17 0812 03/24/17 1438  BP: (!) 141/87 (!) 141/89  Pulse: 91 87  Resp:  (!) 21  Temp:  98.5 F (36.9 C)  SpO2:  93%    Patient feeling better today. Denies recurrent chest pain, spell, bowel pain, nausea vomit, diarrhea or constipation. Does complain of mild headache.   General: Well developed, well nourished, NAD, appears stated age  HEENT: NCAT, mucous membranes moist.  Cardiovascular: S1 S2 auscultated, no rubs, murmurs or gallops. Regular rate and rhythm.  Respiratory: Clear to auscultation bilaterally with equal chest rise  Abdomen: Soft, nontender, nondistended, + bowel sounds  Extremities: warm dry  without cyanosis clubbing or edema  Neuro: AAOx3, nonfocal  Psych: Normal affect and demeanor with intact judgement and insight  Discharge Instructions Discharge Instructions    Discharge instructions    Complete by:  As  directed    Patient will be discharged to home.  Patient will need to follow up with primary care provider within one week of discharge.  Patient should continue medications as prescribed.  Patient should follow a carb modified/heart healthy diet.     Current Discharge Medication List    START taking these medications   Details  butalbital-acetaminophen-caffeine (FIORICET, ESGIC) 50-325-40 MG tablet Take 1 tablet by mouth every 4 (four) hours as needed for headache. Qty: 20 tablet, Refills: 0    Continuous Blood Gluc Sensor MISC 1 each by Does not apply route as directed. Use as directed every 10 days. May dispense FreeStyle [employer] or similar. Qty: 3 each, Refills: 0    fluticasone (FLONASE) 50 MCG/ACT nasal spray Place 1 spray into both nostrils daily. Qty: 16 g, Refills: 2      CONTINUE these medications which have NOT CHANGED   Details  aspirin 81 MG tablet Take 81 mg by mouth daily.      chlorthalidone (HYGROTON) 25 MG tablet Take 1 tablet (25 mg total) by mouth daily. Qty: 90 tablet, Refills: 3    insulin aspart (NOVOLOG) 100 UNIT/ML injection Inject 8 Units into the skin 3 (three) times daily with meals.    insulin glargine (LANTUS SOLOSTAR) 100 UNIT/ML injection Inject 40 Units into the skin 2 (two) times daily.     lisinopril (PRINIVIL,ZESTRIL) 10 MG tablet Take 1 tablet (10 mg total) by mouth daily. Qty: 90 tablet, Refills: 3    metoprolol (TOPROL-XL) 100 MG 24 hr tablet Take 1 tablet (100 mg total) by mouth daily. Qty: 90 tablet, Refills: 3    ODEFSEY 200-25-25 MG TABS tablet TAKE 1 TABLET BY MOUTH DAILY WITH BREAKFAST. Qty: 30 tablet, Refills: 1   Associated Diagnoses: HIV disease ([place])    omega-3 acid ethyl esters (LOVAZA) 1 g capsule Take 2 capsules (2 g total) by mouth 2 (two) times daily. Qty: 360 capsule, Refills: 11   Associated Diagnoses: Coronary artery disease involving native coronary artery of native heart without angina pectoris; CAD,  multiple vessel; Essential hypertension; Hypercholesterolemia; Dyslipidemia    rosuvastatin (CRESTOR) 40 MG tablet Take 1 tablet (40 mg total) by mouth daily. Qty: 90 tablet, Refills: 3       Allergies  Allergen Reactions  . Pepcid [Famotidine] Other (See Comments)    Contraindicated with ODEFSEY  . Prilosec [Omeprazole] Other (See Comments)    Contraindicated with RPV (lowers level of this ARV in ODEFSEY   Follow-up Information    Asencion Noble, MD. Schedule an appointment as soon as possible for a visit in 1 week(s).   Specialty:  Internal Medicine Why:  Hospital follow up Contact information: [address] [place] [address] [phone]        Tommy Medal, Lavell Islam, MD .   Specialty:  Infectious Diseases Contact information: [place] [place] [address] [phone]            The results of significant diagnostics from this hospitalization (including imaging, microbiology, ancillary and laboratory) are listed below for reference.    Significant Diagnostic Studies: Portable Chest X-ray (1 View)  Result Date: 03/23/2017 CLINICAL DATA:  Diabetic ketoacidosis. EXAM: PORTABLE CHEST 1 VIEW COMPARISON:  01/21/2012 FINDINGS: Cardiomediastinal silhouette is normal.  Mediastinal contours appear intact. There is no evidence of focal airspace consolidation, pleural effusion or pneumothorax. Osseous structures are without acute abnormality. Soft tissues are grossly normal. IMPRESSION: No active disease. Electronically Signed   By: Fidela Salisbury M.D.   On: 03/23/2017 21:14    Microbiology: Recent Results (from the past 240 hour(s))  MRSA PCR Screening     Status: None   Collection Time: 03/23/17  6:35 PM  Result Value Ref Range Status   MRSA by PCR NEGATIVE NEGATIVE Final    Comment:        The GeneXpert MRSA Assay (FDA approved for NASAL specimens only), is one component of a comprehensive MRSA colonization surveillance program. It is  not intended to diagnose MRSA infection nor to guide or monitor treatment for MRSA infections.   Culture, blood (routine x 2)     Status: None (Preliminary result)   Collection Time: 03/23/17  6:45 PM  Result Value Ref Range Status   Specimen Description BLOOD LEFT ARM  Final   Special Requests   Final    BOTTLES DRAWN AEROBIC AND ANAEROBIC Blood Culture adequate volume   Culture NO GROWTH < 24 HOURS  Final   Report Status PENDING  Incomplete  Culture, blood (routine x 2)     Status: None (Preliminary result)   Collection Time: 03/23/17  6:50 PM  Result Value Ref Range Status   Specimen Description BLOOD LEFT HAND  Final   Special Requests   Final    BOTTLES DRAWN AEROBIC AND ANAEROBIC Blood Culture results may not be optimal due to an excessive volume of blood received in culture bottles   Culture NO GROWTH < 24 HOURS  Final   Report Status PENDING  Incomplete     Labs: Basic Metabolic Panel:  Recent Labs Lab 03/23/17 1524 03/23/17 2132 03/24/17 0153 03/24/17 0634  NA 135 136 139 137  K 4.5 4.1 3.2* 3.5  CL 95* 102 108 106  CO2 15* 17* 22 21*  GLUCOSE 519* 374* 159* 155*  BUN 20 18 17 15   CREATININE 1.70* 1.40* 0.89 0.89  CALCIUM 10.2 9.1 8.6* 8.5*  MG  --  1.6*  --   --   PHOS  --  4.6  --   --    Liver Function Tests:  Recent Labs Lab 03/23/17 1524  AST 30  ALT 34  ALKPHOS 72  BILITOT 1.8*  PROT 7.0  ALBUMIN 4.0   No results for input(s): LIPASE, AMYLASE in the last 168 hours. No results for input(s): AMMONIA in the last 168 hours. CBC:  Recent Labs Lab 03/23/17 1524  WBC 11.2*  NEUTROABS 8.9*  HGB 15.8  HCT 46.7  MCV 86.6  PLT 290   Cardiac Enzymes:  Recent Labs Lab 03/23/17 2132  TROPONINI <0.03   BNP: BNP (last 3 results) No results for input(s): BNP in the last 8760 hours.  ProBNP (last 3 results) No results for input(s): PROBNP in the last 8760 hours.  CBG:  Recent Labs Lab 03/24/17 0729 03/24/17 0829 03/24/17 1005  03/24/17 1107 03/24/17 1328  GLUCAP 177* 161* 242* 344* 235*       Signed:  Cristal Ford  [facility] 03/24/2017, 4:09 PM

## 2017-03-24 NOTE — Discharge Instructions (Signed)
Diabetic Ketoacidosis °Diabetic ketoacidosis is a life-threatening complication of diabetes. If it is not treated, it can cause severe dehydration and organ damage and can lead to a coma or death. °What are the causes? °This condition develops when there is not enough of the hormone insulin in the body. Insulin helps the body to break down sugar for energy. Without insulin, the body cannot break down sugar, so it breaks down fats instead. This leads to the production of acids that are called ketones. Ketones are poisonous at high levels. °This condition can be triggered by: °· Stress on the body that is brought on by an illness. °· Medicines that raise blood glucose levels. °· Not taking diabetes medicine. ° °What are the signs or symptoms? °Symptoms of this condition include: °· Fatigue. °· Weight loss. °· Excessive thirst. °· Light-headedness. °· Fruity or sweet-smelling breath. °· Excessive urination. °· Vision changes. °· Confusion or irritability. °· Nausea. °· Vomiting. °· Rapid breathing. °· Abdominal pain. °· Feeling flushed. ° °How is this diagnosed? °This condition is diagnosed based on a medical history, a physical exam, and blood tests. You may also have a urine test that checks for ketones. °How is this treated? °This condition may be treated with: °· Fluid replacement. This may be done to correct dehydration. °· Insulin injections. These may be given through the skin or through an IV tube. °· Electrolyte replacement. Electrolytes, such as potassium and sodium, may be given in pill form or through an IV tube. °· Antibiotic medicines. These may be prescribed if your condition was caused by an infection. ° °Follow these instructions at home: °Eating and drinking °· Drink enough fluids to keep your urine clear or pale yellow. °· If you cannot eat, alternate between drinking fluids with sugar (such as juice) and salty fluids (such as broth or bouillon). °· If you can eat, follow your usual diet and drink  sugar-free liquids, such as water. °Other Instructions ° °· Take insulin as directed by your health care provider. Do not skip insulin injections. Do not use expired insulin. °· If your blood sugar is over 240 mg/dL, monitor your urine ketones every 4-6 hours. °· If you were prescribed an antibiotic medicine, finish all of it even if you start to feel better. °· Rest and exercise only as directed by your health care provider. °· If you get sick, call your health care provider and begin treatment quickly. Your body often needs extra insulin to fight an illness. °· Check your blood glucose levels regularly. If your blood glucose is high, drink plenty of fluids. This helps to flush out ketones. °Contact a health care provider if: °· Your blood glucose level is too high or too low. °· You have ketones in your urine. °· You have a fever. °· You cannot eat. °· You cannot tolerate fluids. °· You have been vomiting for more than 2 hours. °· You continue to have symptoms of this condition. °· You develop new symptoms. °Get help right away if: °· Your blood glucose levels continue to be high (elevated). °· Your monitor reads “high” even when you are taking insulin. °· You faint. °· You have chest pain. °· You have trouble breathing. °· You have a sudden, severe headache. °· You have sudden weakness in one arm or one leg. °· You have sudden trouble speaking or swallowing. °· You have vomiting or diarrhea that gets worse after 3 hours. °· You feel severely fatigued. °· You have trouble thinking. °· You   have abdominal pain. °· You are severely dehydrated. Symptoms of severe dehydration include: °? Extreme thirst. °? Dry mouth. °? Blue lips. °? Cold hands and feet. °? Rapid breathing. °This information is not intended to replace advice given to you by your health care provider. Make sure you discuss any questions you have with your health care provider. °Document Released: 05/08/2000 Document Revised: 10/17/2015 Document  Reviewed: 04/18/2014 °Elsevier Interactive Patient Education © 2017 [employer] ° °

## 2017-03-25 LAB — URINE CULTURE: Culture: NO GROWTH

## 2017-03-25 MED FILL — FREESTYLE LIBRE SENSOR SYST: 30 days supply | Qty: 3 | Fill #0

## 2017-03-26 MED FILL — ODEFSEY 200-25-25 MG TABS: 200-25-25 | 30 days supply | Qty: 30 | Fill #1

## 2017-03-28 LAB — CULTURE, BLOOD (ROUTINE X 2)
Culture: NO GROWTH
Culture: NO GROWTH
Special Requests: ADEQUATE

## 2017-03-29 MED FILL — BUTALB-ACETAMIN-CAFF 50-325: 50-325-40 | 4 days supply | Qty: 20 | Fill #0

## 2017-03-31 ENCOUNTER — Encounter (INDEPENDENT_AMBULATORY_CARE_PROVIDER_SITE_OTHER): Payer: Self-pay | Admitting: *Deleted

## 2017-03-31 ENCOUNTER — Other Ambulatory Visit (INDEPENDENT_AMBULATORY_CARE_PROVIDER_SITE_OTHER): Payer: Self-pay | Admitting: *Deleted

## 2017-03-31 DIAGNOSIS — Z800 Family history of malignant neoplasm of digestive organs: Secondary | ICD-10-CM

## 2017-03-31 DIAGNOSIS — Z1211 Encounter for screening for malignant neoplasm of colon: Secondary | ICD-10-CM

## 2017-04-19 ENCOUNTER — Other Ambulatory Visit: Payer: Self-pay | Admitting: Cardiology

## 2017-04-19 MED FILL — METOPROLOL SUCC ER 100 MG T: 100 | 90 days supply | Qty: 90 | Fill #0 | Status: TO

## 2017-04-21 ENCOUNTER — Telehealth: Payer: Self-pay

## 2017-04-21 MED FILL — CHLORTHALIDONE 25 MG TABS: 25 | 90 days supply | Qty: 90 | Fill #0 | Status: TO

## 2017-04-21 MED FILL — LISINOPRIL 10 MG TABS: 10 | 90 days supply | Qty: 90 | Fill #0 | Status: TO

## 2017-04-21 MED FILL — ROSUVASTATIN CALCIUM 40 MG: 40 | 90 days supply | Qty: 90 | Fill #0 | Status: TO

## 2017-04-22 MED FILL — FREESTYLE PREC NEO TEST STR: 25 days supply | Qty: 25 | Fill #0

## 2017-04-22 MED FILL — FREESTYLE LIBRE SENSOR SYST: 30 days supply | Qty: 3 | Fill #0 | Status: TO

## 2017-04-26 ENCOUNTER — Other Ambulatory Visit: Payer: Self-pay | Admitting: Infectious Disease

## 2017-04-26 DIAGNOSIS — B20 Human immunodeficiency virus [HIV] disease: Secondary | ICD-10-CM

## 2017-04-26 MED FILL — ODEFSEY 200-25-25 MG TABS: 200-25-25 | 30 days supply | Qty: 30 | Fill #0

## 2017-05-05 ENCOUNTER — Encounter (INDEPENDENT_AMBULATORY_CARE_PROVIDER_SITE_OTHER): Payer: Self-pay | Admitting: *Deleted

## 2017-05-05 ENCOUNTER — Telehealth (INDEPENDENT_AMBULATORY_CARE_PROVIDER_SITE_OTHER): Payer: Self-pay | Admitting: *Deleted

## 2017-05-05 NOTE — Telephone Encounter (Signed)
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 Therefore should take the prep in day hospital along with IV fluids.

## 2017-05-05 NOTE — Telephone Encounter (Signed)
What should he do day before when he is supposed to do clear liquids, he says his problem was from the clear liquids he had consumed that had sugar in them, not the prep, he didn't even get to drink the prep before

## 2017-05-05 NOTE — Telephone Encounter (Addendum)
Patient is sch'd for TCS 05/28/17 -- he is aware that he will drink prep in endo morning of procedure per your request however; he states his doctors told him while he was in the hospital he needed to be admitted day before procedure when he is doing his clear liquids as it was the clear liquids that lead to the DKA when he was scheduled before -- please advise

## 2017-05-20 MED FILL — ODEFSEY 200-25-25 MG TABS: 200-25-25 | 30 days supply | Qty: 30 | Fill #1

## 2017-05-28 ENCOUNTER — Encounter (HOSPITAL_COMMUNITY)
Admission: RE | Disposition: A | Discharge status: Home / Self Care | Payer: Self-pay | Source: Ambulatory Visit | Attending: Internal Medicine

## 2017-05-28 ENCOUNTER — Ambulatory Visit (HOSPITAL_COMMUNITY)
Admission: RE | Admit: 2017-05-28 | Discharge: 2017-05-28 | Disposition: A | Discharge status: Home / Self Care | Payer: 59 | Source: Ambulatory Visit | Attending: Internal Medicine | Admitting: Internal Medicine

## 2017-05-28 ENCOUNTER — Other Ambulatory Visit: Payer: Self-pay

## 2017-05-28 ENCOUNTER — Encounter (HOSPITAL_COMMUNITY): Payer: Self-pay | Admitting: *Deleted

## 2017-05-28 DIAGNOSIS — K644 Residual hemorrhoidal skin tags: Secondary | ICD-10-CM | POA: Insufficient documentation

## 2017-05-28 DIAGNOSIS — E785 Hyperlipidemia, unspecified: Secondary | ICD-10-CM | POA: Insufficient documentation

## 2017-05-28 DIAGNOSIS — Z888 Allergy status to other drugs, medicaments and biological substances status: Secondary | ICD-10-CM | POA: Insufficient documentation

## 2017-05-28 DIAGNOSIS — Z1211 Encounter for screening for malignant neoplasm of colon: Secondary | ICD-10-CM | POA: Diagnosis not present

## 2017-05-28 DIAGNOSIS — Z7982 Long term (current) use of aspirin: Secondary | ICD-10-CM | POA: Diagnosis not present

## 2017-05-28 DIAGNOSIS — E119 Type 2 diabetes mellitus without complications: Secondary | ICD-10-CM | POA: Insufficient documentation

## 2017-05-28 DIAGNOSIS — D123 Benign neoplasm of transverse colon: Secondary | ICD-10-CM | POA: Diagnosis not present

## 2017-05-28 DIAGNOSIS — Z794 Long term (current) use of insulin: Secondary | ICD-10-CM | POA: Insufficient documentation

## 2017-05-28 DIAGNOSIS — B20 Human immunodeficiency virus [HIV] disease: Secondary | ICD-10-CM | POA: Diagnosis not present

## 2017-05-28 DIAGNOSIS — I2510 Atherosclerotic heart disease of native coronary artery without angina pectoris: Secondary | ICD-10-CM | POA: Diagnosis not present

## 2017-05-28 DIAGNOSIS — Z800 Family history of malignant neoplasm of digestive organs: Secondary | ICD-10-CM

## 2017-05-28 HISTORY — PX: POLYPECTOMY: SHX5525

## 2017-05-28 HISTORY — PX: COLONOSCOPY: SHX5424

## 2017-05-28 LAB — GLUCOSE, CAPILLARY
Glucose-Capillary: 245 mg/dL — ABNORMAL HIGH (ref 65–99)
Glucose-Capillary: 173 mg/dL — ABNORMAL HIGH (ref 65–99)
Glucose-Capillary: 149 mg/dL — ABNORMAL HIGH (ref 65–99)
Glucose-Capillary: 171 mg/dL — ABNORMAL HIGH (ref 65–99)
Glucose-Capillary: 150 mg/dL — ABNORMAL HIGH (ref 65–99)

## 2017-05-28 SURGERY — COLONOSCOPY
Anesthesia: Moderate Sedation

## 2017-05-28 MED ORDER — MIDAZOLAM HCL 5 MG/5ML IJ SOLN
INTRAMUSCULAR | Status: AC
  Filled 2017-05-28: qty 10

## 2017-05-28 MED ORDER — SODIUM CHLORIDE 0.9 % IV SOLN
INTRAVENOUS | Status: DC
  Administered 2017-05-28: 08:00:00 via INTRAVENOUS

## 2017-05-28 MED ORDER — MEPERIDINE HCL 50 MG/ML IJ SOLN
INTRAMUSCULAR | Status: DC | PRN
  Administered 2017-05-28 (×2): 25 mg via INTRAVENOUS

## 2017-05-28 MED ORDER — STERILE WATER FOR IRRIGATION IR SOLN
Status: DC | PRN
  Administered 2017-05-28: 13:00:00

## 2017-05-28 MED ORDER — MIDAZOLAM HCL 5 MG/5ML IJ SOLN
INTRAMUSCULAR | Status: DC | PRN
  Administered 2017-05-28: 2 mg via INTRAVENOUS
  Administered 2017-05-28: 1 mg via INTRAVENOUS
  Administered 2017-05-28: 2 mg via INTRAVENOUS

## 2017-05-28 MED ORDER — NA SULFATE-K SULFATE-MG SULF 17.5-3.13-1.6 GM/177ML PO SOLN
177.0000 mL | ORAL | Status: AC
  Administered 2017-05-28 (×2): 177 mL via ORAL

## 2017-05-28 MED ORDER — MEPERIDINE HCL 50 MG/ML IJ SOLN
INTRAMUSCULAR | Status: AC
  Filled 2017-05-28: qty 1

## 2017-05-28 NOTE — H&P (Signed)
[name] is an 52 y.o. male.   Chief Complaint: Patient is here for colonoscopy. HPI: Patient is 52 year old Caucasian male who is here for screening colonoscopy.  Denies abdominal pain change in bowel habits or rectal bleeding. History significant for colon carcinoma and great aunt on mother's side. He took aspirin 3 days ago.  Past Medical History:  Diagnosis Date  . CAD (coronary artery disease)    DES.  /  ... Later (10/2007) intervention for in-stent restenosis ;  EF 60%... catheterization... June, 2009  . Diabetes mellitus type 2, uncontrolled ([place]) 09/27/2014  . Diabetes mellitus type II   . DKA (diabetic ketoacidoses) ([place]) 12/2010; 03/23/2017  . Dyslipidemia   . Ejection fraction    EF 60%, catheterization, 2009  . History of kidney stones   . HIV (human immunodeficiency virus infection) ([place]) dx'd ~ 2002/2003  . HTN (hypertension)   . 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  . Nausea and vomiting 03/23/2017  . 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 RPR  reverted to negative  . Sinus tachycardia    mild at rest.... October, 2010  . Syphilitic hepatitis     Past Surgical History:  Procedure Laterality Date  . CHOLECYSTECTOMY OPEN  07/2001  . CORONARY ANGIOPLASTY WITH STENT PLACEMENT  03/2006; 10/2007;   . HERNIA REPAIR    . LAPAROSCOPIC INCISIONAL / UMBILICAL / VENTRAL HERNIA REPAIR  07/2001   UHR    Family History  Problem Relation Age of Onset  . Heart attack Father        7 heart attacks  . Anemia Unknown   . Diabetes Unknown   . CAD Sister   . Diabetes Sister    Social History:  reports that  has never smoked. he has never used smokeless tobacco. He reports that he drinks alcohol. He reports that he does not use drugs.  Allergies:  Allergies  Allergen Reactions  . Pepcid [Famotidine] Other (See Comments)    Contraindicated with ODEFSEY  . Prilosec  [Omeprazole] Other (See Comments)    Contraindicated with RPV (lowers level of this ARV in ODEFSEY    Medications Prior to Admission  Medication Sig Dispense Refill  . aspirin 81 MG tablet Take 81 mg by mouth every evening.     . butalbital-acetaminophen-caffeine (FIORICET, ESGIC) 50-325-40 MG tablet Take 1 tablet by mouth every 4 (four) hours as needed for headache. 20 tablet 0  . chlorthalidone (HYGROTON) 25 MG tablet Take 1 tablet (25 mg total) by mouth daily. (Patient taking differently: Take 25 mg by mouth every evening. ) 90 tablet 2  . emtricitabine-rilpivir-tenofovir AF (ODEFSEY) 200-25-25 MG TABS tablet Take 1 tablet by mouth daily with breakfast. (Patient taking differently: Take 1 tablet by mouth every evening. ) 30 tablet 1  . fluticasone (FLONASE) 50 MCG/ACT nasal spray Place 1 spray into both nostrils daily. (Patient taking differently: Place 1 spray into both nostrils daily as needed for allergies. ) 16 g 2  . insulin glargine (LANTUS SOLOSTAR) 100 UNIT/ML injection Inject 65 Units into the skin at bedtime.     . insulin lispro (HUMALOG) 100 UNIT/ML injection Inject 8-12 Units into the skin 3 (three) times daily before meals.    [name] lisinopril (PRINIVIL,ZESTRIL) 10 MG tablet Take 1 tablet (10 mg total) by mouth daily. (Patient taking differently: Take 10 mg by mouth every evening. ) 90 tablet 2  . metoprolol (TOPROL-XL) 100 MG  24 hr tablet Take 1 tablet (100 mg total) by mouth daily. (Patient taking differently: Take 100 mg by mouth every evening. ) 90 tablet 3  . rosuvastatin (CRESTOR) 40 MG tablet Take 1 tablet (40 mg total) by mouth daily. (Patient taking differently: Take 40 mg by mouth every evening. ) 90 tablet 2  . traZODone (DESYREL) 100 MG tablet Take 100 mg by mouth at bedtime as needed for sleep.    . Continuous Blood Gluc Sensor MISC 1 each by Does not apply route as directed. Use as directed every 10 days. May dispense FreeStyle [employer] or similar. 3 each 0     Results for orders placed or performed during the hospital encounter of 05/28/17 (from the past 48 hour(s))  Glucose, capillary     Status: Abnormal   Collection Time: 05/28/17  7:29 AM  Result Value Ref Range   Glucose-Capillary 245 (H) 65 - 99 mg/dL  Glucose, capillary     Status: Abnormal   Collection Time: 05/28/17  9:03 AM  Result Value Ref Range   Glucose-Capillary 173 (H) 65 - 99 mg/dL  Glucose, capillary     Status: Abnormal   Collection Time: 05/28/17 11:12 AM  Result Value Ref Range   Glucose-Capillary 149 (H) 65 - 99 mg/dL   No results found.  ROS  Blood pressure (!) 157/90, pulse 91, temperature 98.6 F (37 C), temperature source Oral, height 5\' 6"  (1.676 m), weight 200 lb (90.7 kg), SpO2 96 %. Physical Exam  Constitutional: He appears well-developed and well-nourished.  HENT:  Mouth/Throat: Oropharynx is clear and moist.  Eyes: Conjunctivae are normal. No scleral icterus.  Neck: No thyromegaly present.  Cardiovascular: Normal rate, regular rhythm and normal heart sounds.  No murmur heard. Respiratory: Effort normal and breath sounds normal.  GI:  Abdomen is full.  It is soft and nontender without organomegaly or masses.  Musculoskeletal: He exhibits no edema.  Lymphadenopathy:    He has no cervical adenopathy.  Neurological: He is alert.  Skin: Skin is warm and dry.     Assessment/Plan Average risk screening colonoscopy.  Hildred Laser, MD 05/28/2017, 12:52 PM

## 2017-05-28 NOTE — OR Nursing (Signed)
Patient drank Suprep without difficulty. Patient has clear yellow bowel movements at this time.

## 2017-05-28 NOTE — Discharge Instructions (Signed)
° °  Colonoscopy, Adult, Care After This sheet gives you information about how to care for yourself after your procedure. Your doctor may also give you more specific instructions. If you have problems or questions, call your doctor. Follow these instructions at home: General instructions   For the first 24 hours after the procedure: ? Do not drive or use machinery. ? Do not sign important documents. ? Do not drink alcohol. ? Do your daily activities more slowly than normal. ? Eat foods that are soft and easy to digest. ? Rest often.  Take over-the-counter or prescription medicines only as told by your doctor.  It is up to you to get the results of your procedure. Ask your doctor, or the department performing the procedure, when your results will be ready. To help cramping and bloating:  Try walking around.  Put heat on your belly (abdomen) as told by your doctor. Use a heat source that your doctor recommends, such as a moist heat pack or a heating pad. ? Put a towel between your skin and the heat source. ? Leave the heat on for 20-30 minutes. ? Remove the heat if your skin turns bright red. This is especially important if you cannot feel pain, heat, or cold. You can get burned. Eating and drinking  Drink enough fluid to keep your pee (urine) clear or pale yellow.  Return to your normal diet as told by your doctor. Avoid heavy or fried foods that are hard to digest.  Avoid drinking alcohol for as long as told by your doctor. Contact a doctor if:  You have blood in your poop (stool) 2-3 days after the procedure. Get help right away if:  You have more than a small amount of blood in your poop.  You see large clumps of tissue (blood clots) in your poop.  Your belly is swollen.  You feel sick to your stomach (nauseous).  You throw up (vomit).  You have a fever.  You have belly pain that gets worse, and medicine does not help your pain. This information is not intended to  replace advice given to you by your health care provider. Make sure you discuss any questions you have with your health care provider. Document Released: 06/13/2010 Document Revised: 02/03/2016 Document Reviewed: 02/03/2016 Elsevier Interactive Patient Education  2017 [employer]. Resume aspirin on 05/29/2017. Resume other medications and diet as before. No driving for 24 hours. Physician will call with biopsy results.

## 2017-05-28 NOTE — Op Note (Signed)
[facility] Patient Name: [name] Procedure Date: 05/28/2017 12:26 PM MRN: [id number] Date of Birth: 10/21/1965 Attending MD: Hildred Laser , MD CSN: [id number] Age: 52 Admit Type: Outpatient Procedure:                Colonoscopy Indications:              Screening for colorectal malignant neoplasm Providers:                Hildred Laser, MD, Otis Peak B. Sharon Seller, RN, Starla Link RN, RN Referring MD:              Medicines:                Midazolam 5 mg IV, Meperidine 50 mg IV Complications:            No immediate complications. Estimated Blood Loss:     Estimated blood loss was minimal. Procedure:                Pre-Anesthesia Assessment:                           - Prior to the procedure, a History and Physical                            was performed, and patient medications and                            allergies were reviewed. The patient's tolerance of                            previous anesthesia was also reviewed. The risks                            and benefits of the procedure and the sedation                            options and risks were discussed with the patient.                            All questions were answered, and informed consent                            was obtained. Prior Anticoagulants: The patient                            last took aspirin 3 days prior to the procedure.                            ASA Grade Assessment: III - A patient with severe                            systemic disease. After reviewing the risks and  benefits, the patient was deemed in satisfactory                            condition to undergo the procedure.                           After obtaining informed consent, the colonoscope                            was passed under direct vision. Throughout the                            procedure, the patient's blood pressure, pulse, and   oxygen saturations were monitored continuously. The                            [id number] ([id number]) scope was introduced through                            the anus and advanced to the the cecum, identified                            by appendiceal orifice and ileocecal valve. The                            colonoscopy was performed without difficulty. The                            patient tolerated the procedure well. The quality                            of the bowel preparation was excellent. The                            ileocecal valve, appendiceal orifice, and rectum                            were photographed. Scope In: 1:02:46 PM Scope Out: 1:13:04 PM Total Procedure Duration: 0 hours 10 minutes 18 seconds  Findings:      The perianal and digital rectal examinations were normal.      A small polyp was found in the hepatic flexure. The polyp was sessile.       Biopsies were taken with a cold forceps for histology.      The exam was otherwise normal throughout the examined colon.      External hemorrhoids were found during retroflexion. The hemorrhoids       were small. Impression:               - One small polyp at the hepatic flexure. Biopsied.                           - External hemorrhoids. Moderate Sedation:      Moderate (conscious) sedation was administered by the endoscopy nurse       and supervised by the endoscopist. The following parameters were  monitored: oxygen saturation, heart rate, blood pressure, CO2       capnography and response to care. Total physician intraservice time was       16 minutes. Recommendation:           - Patient has a contact number available for                            emergencies. The signs and symptoms of potential                            delayed complications were discussed with the                            patient. Return to normal activities tomorrow.                            Written discharge instructions were  provided to the                            patient.                           - Resume previous diet today.                           - Continue present medications.                           - Await pathology results.                           - Repeat colonoscopy is recommended. The                            colonoscopy date will be determined after pathology                            results from today's exam become available for                            review.                           - Resume aspirin at prior dose tomorrow.                            [Management]. Procedure Code(s):        --- Professional ---                           [phone], Colonoscopy, flexible; with biopsy, single                            or multiple                           99152, Moderate sedation services provided by the  same physician or other qualified health care                            professional performing the diagnostic or                            therapeutic service that the sedation supports,                            requiring the presence of an independent trained                            observer to assist in the monitoring of the                            patient's level of consciousness and physiological                            status; initial 15 minutes of intraservice time,                            patient age 58 years or older Diagnosis Code(s):        --- Professional ---                           Z12.11, Encounter for screening for malignant                            neoplasm of colon                           D12.3, Benign neoplasm of transverse colon (hepatic                            flexure or splenic flexure)                           K64.4, Residual hemorrhoidal skin tags CPT copyright 2016 American Medical Association. All rights reserved. The codes documented in this report are preliminary and upon coder review may  be revised to  meet current compliance requirements. Hildred Laser, MD Hildred Laser, MD 05/28/2017 1:22:07 PM This report has been signed electronically. Number of Addenda: 0

## 2017-05-31 ENCOUNTER — Encounter (HOSPITAL_COMMUNITY): Payer: Self-pay | Admitting: Internal Medicine

## 2017-06-01 MED FILL — SHIPPING COST: 1 days supply | Qty: 1 | Fill #0

## 2017-06-01 MED FILL — FREESTYLE LIBRE SENSOR SYST: 30 days supply | Qty: 3 | Fill #0

## 2017-06-08 ENCOUNTER — Other Ambulatory Visit: Payer: Self-pay | Admitting: Pharmacist

## 2017-06-15 ENCOUNTER — Other Ambulatory Visit: Payer: Self-pay | Admitting: Infectious Disease

## 2017-06-15 ENCOUNTER — Encounter: Payer: Self-pay | Admitting: "Endocrinology

## 2017-06-15 DIAGNOSIS — E1129 Type 2 diabetes mellitus with other diabetic kidney complication: Secondary | ICD-10-CM | POA: Diagnosis not present

## 2017-06-15 DIAGNOSIS — B20 Human immunodeficiency virus [HIV] disease: Secondary | ICD-10-CM

## 2017-06-15 MED FILL — ODEFSEY 200-25-25 MG TABS: 200-25-25 | 30 days supply | Qty: 30 | Fill #0

## 2017-06-15 MED FILL — HUMALOG 100 UNITS/ML KWIKPE: 100 | 90 days supply | Qty: 54 | Fill #1

## 2017-06-16 MED FILL — SHIPPING COST: 1 days supply | Qty: 1 | Fill #0

## 2017-06-21 DIAGNOSIS — I2510 Atherosclerotic heart disease of native coronary artery without angina pectoris: Secondary | ICD-10-CM | POA: Diagnosis not present

## 2017-06-21 DIAGNOSIS — Z6820 Body mass index (BMI) 20.0-20.9, adult: Secondary | ICD-10-CM | POA: Diagnosis not present

## 2017-06-21 DIAGNOSIS — E1139 Type 2 diabetes mellitus with other diabetic ophthalmic complication: Secondary | ICD-10-CM | POA: Diagnosis not present

## 2017-06-21 MED FILL — FREESTYLE LIBRE SENSOR SYST: 90 days supply | Qty: 9 | Fill #0 | Status: TO

## 2017-07-13 MED FILL — ODEFSEY 200-25-25 MG TABS: 200-25-25 | 30 days supply | Qty: 30 | Fill #1

## 2017-08-04 ENCOUNTER — Other Ambulatory Visit: Payer: Self-pay | Admitting: Infectious Disease

## 2017-08-04 DIAGNOSIS — B20 Human immunodeficiency virus [HIV] disease: Secondary | ICD-10-CM

## 2017-08-05 MED FILL — SHIPPING COST: 1 days supply | Qty: 1 | Fill #1

## 2017-08-05 MED FILL — LANTUS SOLOSTAR 100 UNITS/M: 100 | 84 days supply | Qty: 90 | Fill #0

## 2017-08-05 MED FILL — ODEFSEY 200-25-25 MG TABS: 200-25-25 | 30 days supply | Qty: 30 | Fill #0

## 2017-08-10 DIAGNOSIS — E119 Type 2 diabetes mellitus without complications: Secondary | ICD-10-CM | POA: Diagnosis not present

## 2017-08-10 DIAGNOSIS — H524 Presbyopia: Secondary | ICD-10-CM | POA: Diagnosis not present

## 2017-08-10 DIAGNOSIS — H52223 Regular astigmatism, bilateral: Secondary | ICD-10-CM | POA: Diagnosis not present

## 2017-08-10 DIAGNOSIS — H5213 Myopia, bilateral: Secondary | ICD-10-CM | POA: Diagnosis not present

## 2017-08-12 MED FILL — HUMALOG 100 UNITS/ML KWIKPE: 100 | 90 days supply | Qty: 90 | Fill #0

## 2017-08-30 ENCOUNTER — Ambulatory Visit: Payer: 59 | Admitting: Cardiology

## 2017-08-30 DIAGNOSIS — R0989 Other specified symptoms and signs involving the circulatory and respiratory systems: Secondary | ICD-10-CM

## 2017-08-30 MED FILL — ROSUVASTATIN CALCIUM 40 MG: 40 | 90 days supply | Qty: 90 | Fill #0

## 2017-08-30 MED FILL — METOPROLOL SUCCINATE ER 100: 100 | 90 days supply | Qty: 90 | Fill #0

## 2017-08-30 MED FILL — CHLORTHALIDONE 25 MG TAB: 25 | 90 days supply | Qty: 90 | Fill #0

## 2017-08-30 MED FILL — ODEFSEY 200-25-25 MG TABS: 200-25-25 | 30 days supply | Qty: 30 | Fill #1

## 2017-08-30 MED FILL — LISINOPRIL 10 MG TABS: 10 | 90 days supply | Qty: 90 | Fill #0

## 2017-08-31 ENCOUNTER — Encounter: Payer: Self-pay | Admitting: Cardiology

## 2017-09-14 ENCOUNTER — Encounter: Payer: Self-pay | Admitting: "Endocrinology

## 2017-09-14 DIAGNOSIS — E1039 Type 1 diabetes mellitus with other diabetic ophthalmic complication: Secondary | ICD-10-CM | POA: Diagnosis not present

## 2017-09-21 DIAGNOSIS — I2510 Atherosclerotic heart disease of native coronary artery without angina pectoris: Secondary | ICD-10-CM | POA: Diagnosis not present

## 2017-09-21 DIAGNOSIS — E109 Type 1 diabetes mellitus without complications: Secondary | ICD-10-CM | POA: Diagnosis not present

## 2017-09-27 MED FILL — ODEFSEY 200-25-25 MG TABS: 200-25-25 | 30 days supply | Qty: 30 | Fill #2

## 2017-09-27 MED FILL — SHIPPING COST: 1 days supply | Qty: 1 | Fill #2

## 2017-10-11 MED FILL — FREESTYLE LIBRE 14 DAY READ: 1 days supply | Qty: 1 | Fill #0

## 2017-10-11 MED FILL — SHIPPING COST: 1 days supply | Qty: 1 | Fill #3

## 2017-10-12 MED FILL — FREESTYLE LIBRE 14 DAY SENS: 84 days supply | Qty: 6 | Fill #0

## 2017-10-22 MED FILL — ODEFSEY 200-25-25 MG TABS: 200-25-25 | 30 days supply | Qty: 30 | Fill #3

## 2017-10-22 MED FILL — SHIPPING COST: 1 days supply | Qty: 1 | Fill #4

## 2017-11-02 MED FILL — SHIPPING COST: 1 days supply | Qty: 1 | Fill #5

## 2017-11-02 MED FILL — HUMALOG 100 UNITS/ML KWIKPE: 100 | 90 days supply | Qty: 90 | Fill #1

## 2017-11-05 MED FILL — SHIPPING COST: 1 days supply | Qty: 1 | Fill #6

## 2017-11-05 MED FILL — LANTUS SOLOSTAR 100 UNITS/M: 100 | 56 days supply | Qty: 60 | Fill #0

## 2017-11-18 ENCOUNTER — Ambulatory Visit (INDEPENDENT_AMBULATORY_CARE_PROVIDER_SITE_OTHER): Payer: Self-pay | Admitting: Family Medicine

## 2017-11-18 VITALS — BP 145/78 | HR 99 | Temp 98.2°F | Resp 18 | Ht 66.0 in | Wt 211.2 lb

## 2017-11-18 DIAGNOSIS — Z0000 Encounter for general adult medical examination without abnormal findings: Secondary | ICD-10-CM

## 2017-11-18 NOTE — Patient Instructions (Signed)

## 2017-11-18 NOTE — Progress Notes (Signed)
PHY

## 2017-11-18 NOTE — Progress Notes (Signed)
[name] is a 52 y.o. male who presents today with concerns of need for a physical exam. He has a primary care provider who manages his chronic conditions of HTN, DM II and hyperlipidemia. He is also followed by cardiology (he has 3 stents in place) as well as Infectious Disease.  Review of Systems  Constitutional: Negative for chills, fever and malaise/fatigue.  HENT: Negative for congestion, ear discharge, ear pain, sinus pain and sore throat.   Eyes: Negative.   Respiratory: Negative for cough, sputum production and shortness of breath.   Cardiovascular: Negative.  Negative for chest pain.  Gastrointestinal: Negative for abdominal pain, diarrhea, nausea and vomiting.  Genitourinary: Negative for dysuria, frequency, hematuria and urgency.  Musculoskeletal: Negative for myalgias.  Skin: Negative.   Neurological: Negative for headaches.  Endo/Heme/Allergies: Negative.   Psychiatric/Behavioral: Negative.     O: Vitals:   11/18/17 1531  BP: (!) 145/78  Pulse: 99  Resp: 18  Temp: 98.2 F (36.8 C)  SpO2: 96%     Physical Exam  Constitutional: He is oriented to person, place, and time. Vital signs are normal. He appears well-developed and well-nourished. He is active.  Non-toxic appearance. He does not have a sickly appearance.  HENT:  Head: Normocephalic.  Right Ear: Hearing, tympanic membrane, external ear and ear canal normal.  Left Ear: Hearing, tympanic membrane, external ear and ear canal normal.  Nose: Nose normal.  Mouth/Throat: Uvula is midline and oropharynx is clear and moist.  Neck: Normal range of motion. Neck supple.  Cardiovascular: Normal rate, regular rhythm, normal heart sounds and normal pulses.  Pulmonary/Chest: Effort normal and breath sounds normal.  Abdominal: Soft. Bowel sounds are normal.  Musculoskeletal: Normal range of motion.  Lymphadenopathy:       Head (right side): No submental and no submandibular adenopathy present.       Head (left side):  No submental and no submandibular adenopathy present.    He has no cervical adenopathy.  Neurological: He is alert and oriented to person, place, and time.  Psychiatric: He has a normal mood and affect. His speech is normal and behavior is normal. Cognition and memory are normal.  PHQ-9- negative  Vitals reviewed.  A: 1. Physical exam    P: Exam findings, diagnosis etiology and medication use and indications reviewed with patient. Follow- Up and discharge instructions provided. No emergent/urgent issues found on exam.  Patient verbalized understanding of information provided and agrees with plan of care (POC), all questions answered.  1. Physical exam WNL

## 2017-11-22 MED FILL — ROSUVASTATIN CALCIUM 40 MG: 40 | 90 days supply | Qty: 90 | Fill #1

## 2017-11-22 MED FILL — CHLORTHALIDONE 25 MG TAB: 25 | 90 days supply | Qty: 90 | Fill #1

## 2017-11-22 MED FILL — ODEFSEY 200-25-25 MG TABS: 200-25-25 | 30 days supply | Qty: 30 | Fill #4

## 2017-11-22 MED FILL — METOPROLOL SUCCINATE ER 100: 100 | 90 days supply | Qty: 90 | Fill #1

## 2017-11-22 MED FILL — SHIPPING COST: 1 days supply | Qty: 1 | Fill #7

## 2017-11-22 MED FILL — LISINOPRIL 10 MG TABLET: 10 | 90 days supply | Qty: 90 | Fill #1

## 2017-12-13 ENCOUNTER — Encounter: Payer: Self-pay | Admitting: "Endocrinology

## 2017-12-13 DIAGNOSIS — Z79899 Other long term (current) drug therapy: Secondary | ICD-10-CM | POA: Diagnosis not present

## 2017-12-13 DIAGNOSIS — I2510 Atherosclerotic heart disease of native coronary artery without angina pectoris: Secondary | ICD-10-CM | POA: Diagnosis not present

## 2017-12-13 DIAGNOSIS — B20 Human immunodeficiency virus [HIV] disease: Secondary | ICD-10-CM | POA: Diagnosis not present

## 2017-12-13 DIAGNOSIS — E1029 Type 1 diabetes mellitus with other diabetic kidney complication: Secondary | ICD-10-CM | POA: Diagnosis not present

## 2017-12-13 DIAGNOSIS — Z125 Encounter for screening for malignant neoplasm of prostate: Secondary | ICD-10-CM | POA: Diagnosis not present

## 2017-12-13 LAB — HEMOGLOBIN A1C: Hgb A1c MFr Bld: 9.8 — AB (ref 4.0–6.0)

## 2017-12-20 DIAGNOSIS — I2510 Atherosclerotic heart disease of native coronary artery without angina pectoris: Secondary | ICD-10-CM | POA: Diagnosis not present

## 2017-12-20 DIAGNOSIS — E109 Type 1 diabetes mellitus without complications: Secondary | ICD-10-CM | POA: Diagnosis not present

## 2017-12-20 DIAGNOSIS — E785 Hyperlipidemia, unspecified: Secondary | ICD-10-CM | POA: Diagnosis not present

## 2017-12-20 DIAGNOSIS — Z0001 Encounter for general adult medical examination with abnormal findings: Secondary | ICD-10-CM | POA: Diagnosis not present

## 2017-12-20 MED FILL — FREESTYLE LIBRE 14 DAY SENS: 84 days supply | Qty: 6 | Fill #1

## 2017-12-20 MED FILL — ODEFSEY 200-25-25 MG TABS: 200-25-25 | 30 days supply | Qty: 30 | Fill #5

## 2017-12-20 MED FILL — SHIPPING COST: 1 days supply | Qty: 1 | Fill #8

## 2018-01-11 ENCOUNTER — Other Ambulatory Visit: Payer: Self-pay | Admitting: Infectious Disease

## 2018-01-11 DIAGNOSIS — B20 Human immunodeficiency virus [HIV] disease: Secondary | ICD-10-CM

## 2018-01-17 MED FILL — ODEFSEY 200-25-25 MG TABS: 200-25-25 | 30 days supply | Qty: 30 | Fill #0

## 2018-02-07 ENCOUNTER — Encounter: Payer: Self-pay | Admitting: "Endocrinology

## 2018-02-07 MED FILL — HUMALOG 100 UNITS/ML KWIKPE: 100 | 90 days supply | Qty: 90 | Fill #2

## 2018-02-08 ENCOUNTER — Ambulatory Visit (INDEPENDENT_AMBULATORY_CARE_PROVIDER_SITE_OTHER): Payer: 59 | Admitting: "Endocrinology

## 2018-02-08 ENCOUNTER — Encounter: Payer: Self-pay | Admitting: "Endocrinology

## 2018-02-08 VITALS — BP 131/84 | HR 80 | Ht 66.0 in | Wt 206.0 lb

## 2018-02-08 DIAGNOSIS — E782 Mixed hyperlipidemia: Secondary | ICD-10-CM

## 2018-02-08 DIAGNOSIS — E1165 Type 2 diabetes mellitus with hyperglycemia: Secondary | ICD-10-CM | POA: Diagnosis not present

## 2018-02-08 DIAGNOSIS — I10 Essential (primary) hypertension: Secondary | ICD-10-CM

## 2018-02-08 MED FILL — SHIPPING COST: 1 days supply | Qty: 1 | Fill #9

## 2018-02-08 NOTE — Progress Notes (Signed)
Endocrinology Consult Note       02/08/2018, 1:15 PM   Subjective:    Patient ID: [name], male    DOB: [birth year].  [name] is being seen in consultation for management of currently uncontrolled symptomatic diabetes requested by  Omar Noble, MD.   Past Medical History:  Diagnosis Date  . CAD (coronary artery disease)    DES.  /  ... Later (10/2007) intervention for in-stent restenosis ;  EF 60%... catheterization... June, 2009  . Diabetes mellitus type 2, uncontrolled ([place]) 09/27/2014  . Diabetes mellitus type II   . DKA (diabetic ketoacidoses) ([name]) 12/2010; 03/23/2017  . Dyslipidemia   . Ejection fraction    EF 60%, catheterization, 2009  . History of kidney stones   . HIV (human immunodeficiency virus infection) ([name]) dx'd ~ 2002/2003  . HTN (hypertension)   . 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  . Nausea and vomiting 03/23/2017  . 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 RPR  reverted to negative  . Sinus tachycardia    mild at rest.... October, 2010  . Syphilitic hepatitis    Past Surgical History:  Procedure Laterality Date  . CHOLECYSTECTOMY OPEN  07/2001  . COLONOSCOPY N/A 05/28/2017   Procedure: COLONOSCOPY;  Surgeon: Omar Houston, MD;  [facility];  Service: Endoscopy;  Laterality: N/A;  200 - pt to prep in Endo  . CORONARY ANGIOPLASTY WITH STENT PLACEMENT  03/2006; 10/2007;   . HERNIA REPAIR    . LAPAROSCOPIC INCISIONAL / UMBILICAL / VENTRAL HERNIA REPAIR  07/2001   UHR  . POLYPECTOMY  05/28/2017   Procedure: POLYPECTOMY;  Surgeon: Omar Houston, MD;  Location: [facility];  Service: Endoscopy;;  colon   Social History   Socioeconomic History  . Marital status: Single    Spouse name: Not on file  . Number of children: Not on file  . Years of  education: Not on file  . Highest education level: Not on file  Occupational History  . Not on file  Social Needs  . Financial resource strain: Not on file  . Food insecurity:    Worry: Not on file    Inability: Not on file  . Transportation needs:    Medical: Not on file    Non-medical: Not on file  Tobacco Use  . Smoking status: Never Smoker  . Smokeless tobacco: Never Used  Substance and Sexual Activity  . Alcohol use: Yes    Alcohol/week: 0.0 - 1.0 standard drinks    Comment: 03/23/2017 "might have a drink 4-6 times/year"  . Drug use: No  . Sexual activity: Not Currently  Lifestyle  . Physical activity:    Days per week: Not on file    Minutes per session: Not on file  . Stress: Not on file  Relationships  . Social connections:    Talks on phone: Not on file    Gets together: Not on file    Attends religious service: Not on file    Active member of club or  organization: Not on file    Attends meetings of clubs or organizations: Not on file    Relationship status: Not on file  Other Topics Concern  . Not on file  Social History Narrative  . Not on file   Outpatient Encounter Medications as of 02/08/2018  Medication Sig  . aspirin 81 MG tablet Take 1 tablet (81 mg total) by mouth every evening.  . chlorthalidone (HYGROTON) 25 MG tablet Take 1 tablet (25 mg total) by mouth daily.  . insulin glargine (LANTUS SOLOSTAR) 100 UNIT/ML injection Inject 50 Units into the skin at bedtime.  . insulin lispro (HUMALOG) 100 UNIT/ML injection Inject 8-14 Units into the skin 3 (three) times daily before meals.  [name] lisinopril (PRINIVIL,ZESTRIL) 10 MG tablet Take 1 tablet (10 mg total) by mouth daily. (Patient taking differently: Take 10 mg by mouth every evening. )  . metoprolol (TOPROL-XL) 100 MG 24 hr tablet Take 1 tablet (100 mg total) by mouth daily. (Patient taking differently: Take 100 mg by mouth every evening. )  . ODEFSEY 200-25-25 MG TABS tablet TAKE 1 TABLET BY MOUTH DAILY  WITH BREAKFAST. PLEASE CALL TO SCHEDULE YOUR FOLLOW UP [phone]  . rosuvastatin (CRESTOR) 40 MG tablet Take 1 tablet (40 mg total) by mouth daily. (Patient taking differently: Take 40 mg by mouth every evening. )  . [DISCONTINUED] butalbital-acetaminophen-caffeine (FIORICET, ESGIC) 50-325-40 MG tablet Take 1 tablet by mouth every 4 (four) hours as needed for headache. (Patient not taking: Reported on 11/18/2017)  . [DISCONTINUED] Continuous Blood Gluc Sensor MISC 1 each by Does not apply route as directed. Use as directed every 10 days. May dispense FreeStyle [employer] or similar. (Patient not taking: Reported on 11/18/2017)  . [DISCONTINUED] fluticasone (FLONASE) 50 MCG/ACT nasal spray Place 1 spray into both nostrils daily. (Patient taking differently: Place 1 spray into both nostrils daily as needed for allergies. )  . [DISCONTINUED] traZODone (DESYREL) 100 MG tablet Take 100 mg by mouth at bedtime as needed for sleep.   No facility-administered encounter medications on file as of 02/08/2018.     ALLERGIES: Allergies  Allergen Reactions  . Pepcid [Famotidine] Other (See Comments)    Contraindicated with ODEFSEY  . Prilosec [Omeprazole] Other (See Comments)    Contraindicated with RPV (lowers level of this ARV in ODEFSEY    VACCINATION STATUS: Immunization History  Administered Date(s) Administered  . Hepatitis A 07/22/2007  . Influenza Whole 01/31/2013  . Influenza, Quadrivalent, Recombinant, Inj, Pf 01/18/2017  . Pneumococcal Conjugate-13 09/13/2012  . Td 05/25/1996    Diabetes  He presents for his initial diabetic visit. He has type 2 diabetes mellitus. His disease course has been worsening. There are no hypoglycemic associated symptoms. Pertinent negatives for hypoglycemia include no confusion, headaches, pallor or seizures. Associated symptoms include polydipsia and polyuria. Pertinent negatives for diabetes include no chest pain, no fatigue, no polyphagia and no  weakness. There are no hypoglycemic complications. Symptoms are worsening. Diabetic complications include heart disease. Risk factors for coronary artery disease include diabetes mellitus, dyslipidemia, family history, obesity, male sex, hypertension and sedentary lifestyle. Current diabetic treatment includes insulin injections (He is currently on Lantus 45 units twice a day, Humalog 8 units 3 times daily AC.). His weight is fluctuating minimally. He is following a generally unhealthy diet. When asked about meal planning, he reported none. He has had a previous visit with a dietitian. He participates in exercise intermittently. (He wears a CGM sensor, did not bring his meter.  His most  recent A1c was 9.8%.  His prior A1c measurements where or above target at 10.6%, 10.8%, 9.6%.) An ACE inhibitor/angiotensin II receptor blocker is being taken. Eye exam is current.  Hyperlipidemia  This is a chronic problem. The current episode started more than 1 year ago. The problem is uncontrolled. Recent lipid tests were reviewed and are high. Exacerbating diseases include diabetes and obesity. Pertinent negatives include no chest pain, myalgias or shortness of breath. Risk factors for coronary artery disease include dyslipidemia, diabetes mellitus, hypertension, a sedentary lifestyle, male sex, family history and obesity.  Hypertension  This is a chronic problem. The current episode started more than 1 year ago. The problem is controlled. Pertinent negatives include no chest pain, headaches, neck pain, palpitations or shortness of breath. Past treatments include ACE inhibitors, beta blockers and diuretics. Hypertensive end-organ damage includes CAD/MI.      Review of Systems  Constitutional: Negative for chills, fatigue, fever and unexpected weight change.  HENT: Negative for dental problem, mouth sores and trouble swallowing.   Eyes: Negative for visual disturbance.  Respiratory: Negative for cough, choking, chest  tightness, shortness of breath and wheezing.   Cardiovascular: Negative for chest pain, palpitations and leg swelling.  Gastrointestinal: Negative for abdominal distention, abdominal pain, constipation, diarrhea, nausea and vomiting.  Endocrine: Positive for polydipsia and polyuria. Negative for polyphagia.  Genitourinary: Negative for dysuria, flank pain, hematuria and urgency.  Musculoskeletal: Negative for back pain, gait problem, myalgias and neck pain.  Skin: Negative for pallor, rash and wound.  Allergic/Immunologic: Positive for immunocompromised state.  Neurological: Negative for seizures, syncope, weakness, numbness and headaches.  Psychiatric/Behavioral: Negative for confusion and dysphoric mood.    Objective:    BP 131/84   Pulse 80   Ht 5\' 6"  (1.676 m)   Wt 206 lb (93.4 kg)   BMI 33.25 kg/m   Wt Readings from Last 3 Encounters:  02/08/18 206 lb (93.4 kg)  11/18/17 211 lb 3.2 oz (95.8 kg)  05/28/17 200 lb (90.7 kg)     Physical Exam  Constitutional: He is oriented to person, place, and time. He appears well-developed and well-nourished. He is cooperative. No distress.  HENT:  Head: Normocephalic and atraumatic.  Eyes: EOM are normal.  Neck: Normal range of motion. Neck supple. No tracheal deviation present. No thyromegaly present.  Cardiovascular: Normal rate, S1 normal, S2 normal and normal heart sounds. Exam reveals no gallop.  No murmur heard. Pulses:      Dorsalis pedis pulses are 1+ on the right side, and 1+ on the left side.       Posterior tibial pulses are 1+ on the right side, and 1+ on the left side.  Pulmonary/Chest: Breath sounds normal. No respiratory distress. He has no wheezes.  Abdominal: Soft. Bowel sounds are normal. He exhibits no distension. There is no tenderness. There is no guarding and no CVA tenderness.  Musculoskeletal: He exhibits no edema.       Right shoulder: He exhibits no swelling and no deformity.  Neurological: He is alert and  oriented to person, place, and time. He has normal strength and normal reflexes. No cranial nerve deficit or sensory deficit. Gait normal.  Skin: Skin is warm and dry. No rash noted. No cyanosis. Nails show no clubbing.  Psychiatric: He has a normal mood and affect. His speech is normal. Judgment normal. Cognition and memory are normal.      CMP ( most recent) CMP     Component Value Date/Time   NA  137 03/24/2017 0634   K 3.5 03/24/2017 0634   CL 106 03/24/2017 0634   CO2 21 (L) 03/24/2017 0634   GLUCOSE 155 (H) 03/24/2017 0634   BUN 15 03/24/2017 0634   CREATININE 0.89 03/24/2017 0634   CREATININE 0.80 11/30/2016 1344   CALCIUM 8.5 (L) 03/24/2017 0634   PROT 7.0 03/23/2017 1524   ALBUMIN 4.0 03/23/2017 1524   AST 30 03/23/2017 1524   ALT 34 03/23/2017 1524   ALKPHOS 72 03/23/2017 1524   BILITOT 1.8 (H) 03/23/2017 1524   GFRNONAA >60 03/24/2017 0634   GFRNONAA >89 11/30/2016 1344   GFRAA >60 03/24/2017 0634   GFRAA >89 11/30/2016 1344     Diabetic Labs (most recent): Lab Results  Component Value Date   HGBA1C 10.6 (H) 03/23/2017   HGBA1C 8.5 03/30/2009     Lipid Panel ( most recent) Lipid Panel     Component Value Date/Time   CHOL 141 10/28/2015 0957   TRIG 295 (H) 10/28/2015 0957   HDL 40 10/28/2015 0957   CHOLHDL 3.5 10/28/2015 0957   VLDL 59 (H) 10/28/2015 0957   LDLCALC 42 10/28/2015 0957       Assessment & Plan:   1. Uncontrolled type 2 diabetes mellitus with hyperglycemia ([place])  - Jaylene P Duesing has currently uncontrolled symptomatic type 2 DM since 52 years of age,  with most recent A1c of 9.8 %. Recent labs reviewed.  -his diabetes is complicated by coronary artery disease status post stent placement, diabetes ketoacidosis at least on 2 occasions last one in October 2018, and he remains at a high risk for more acute and chronic complications which include CAD, CVA, CKD, retinopathy, and neuropathy. These are all discussed in detail with him.  - I  have counseled him on diet management and weight loss, by adopting a carbohydrate restricted/protein rich diet.  - Suggestion is made for him to avoid simple carbohydrates  from his diet including Cakes, Sweet Desserts, Ice Cream, Soda (diet and regular), Sweet Tea, Candies, Chips, Cookies, Store Bought Juices, Alcohol in Excess of  1-2 drinks a day, Artificial Sweeteners,  Coffee Creamer, and "Sugar-free" Products. This will help patient to have more stable blood glucose profile and potentially avoid unintended weight gain.  - I encouraged him to switch to  unprocessed or minimally processed complex starch and increased protein intake (animal or plant source), fruits, and vegetables.  - he is advised to stick to a routine mealtimes to eat 3 meals  a day and avoid unnecessary snacks ( to snack only to correct hypoglycemia).  - I have approached him with the following individualized plan to manage diabetes and patient agrees:   -Regarding his prevailing glycemic burden, he will continue to require intensive treatment with basal/bolus insulin in order for him to achieve and maintain control of diabetes to target.   -I approached him to readjust his Lantus to 50 units nightly, readjust his prandial insulin Humalog to 8 units 3 times a day with meals  for pre-meal BG readings of 90-150mg /dl, plus patient specific correction dose for unexpected hyperglycemia above 150mg /dl, associated with strict monitoring of glucose 4 times a day-before meals and at bedtime.  He is currently using the Freestyle Libre CGM device. - Patient is warned not to take insulin without proper monitoring per orders. -Adjustment parameters are given for hypo and hyperglycemia in writing. - he is encouraged to call clinic for blood glucose levels less than 70 or above 200 mg /dl.  -He may  benefit from slow introduction of GLP-1 receptor agonists in hope of minimizing his insulin burden going forward.  He will be considered for  Trulicity or Ozempic during his next visit. - Patient specific target  A1c;  LDL, HDL, Triglycerides, and  Waist Circumference were discussed in detail.  2) BP/HTN:  his blood pressure is controlled to target.   he is advised to continue his current medications including lisinopril.  Mg p.o. daily with breakfast . 3) Lipids/HPL:   Review of his recent lipid panel showed  controlled  LDL at 42.  he  is advised to continue    Crestor 40 mg daily at bedtime.  Side effects and precautions discussed with him.  4)  Weight/Diet:  Body mass index is 33.25 kg/m.  - clearly complicating his diabetes care.  I discussed with him the fact that loss of 5 - 10% of his  current body weight will have the most impact on his diabetes management.  He declined CDE Consult . Exercise, and detailed carbohydrates information provided  -  detailed on discharge instructions.  5) Chronic Care/Health Maintenance:  -he  is on ACEI/ARB and Statin medications and  is encouraged to initiate and continue to follow up with Ophthalmology, Dentist, infectious diseases given his history of HIV infection,  podiatrist at least yearly or according to recommendations, and advised to  stay away from smoking. I have recommended yearly flu vaccine and pneumonia vaccine at least every 5 years; moderate intensity exercise for up to 150 minutes weekly; and  sleep for at least 7 hours a day.  - I advised patient to maintain close follow up with Omar Noble, MD for primary care needs.  - Time spent with the patient: 45 minutes, of which >50% was spent in obtaining information about his symptoms, reviewing his previous labs, evaluations, and treatments, counseling him about his currently uncontrolled type 2 diabetes, hyperlipidemia, hypertension, and developing developing  plans for long term treatment based on the latest recommendations.  [name] participated in the discussions, expressed understanding, and voiced agreement with the above  plans.  All questions were answered to his satisfaction. he is encouraged to contact clinic should he have any questions or concerns prior to his return visit.  Follow up plan: - Return in about 10 days (around 02/18/2018) for Follow up with Meter and Logs Only - no Labs.  Glade Lloyd, MD [facility] [address] [place], [place] [address] Phone: [phone]  Fax: [phone]    02/08/2018, 1:15 PM  This note was partially dictated with voice recognition software. Similar sounding words can be transcribed inadequately or may not  be corrected upon review.

## 2018-02-08 NOTE — Patient Instructions (Signed)

## 2018-02-09 ENCOUNTER — Ambulatory Visit: Payer: Self-pay | Admitting: "Endocrinology

## 2018-02-10 MED FILL — SHIPPING COST: 1 days supply | Qty: 1 | Fill #10

## 2018-02-10 MED FILL — LANTUS SOLOSTAR 100 UNITS/M: 100 | 56 days supply | Qty: 60 | Fill #0

## 2018-02-10 MED FILL — ODEFSEY 200-25-25 MG TABS: 200-25-25 | 30 days supply | Qty: 30 | Fill #1

## 2018-02-22 ENCOUNTER — Ambulatory Visit (INDEPENDENT_AMBULATORY_CARE_PROVIDER_SITE_OTHER): Payer: 59 | Admitting: Infectious Disease

## 2018-02-22 ENCOUNTER — Other Ambulatory Visit (HOSPITAL_COMMUNITY)
Admission: RE | Admit: 2018-02-22 | Discharge: 2018-02-22 | Disposition: A | Discharge status: Home / Self Care | Payer: 59 | Source: Ambulatory Visit | Attending: Infectious Disease | Admitting: Infectious Disease

## 2018-02-22 VITALS — Wt 203.1 lb

## 2018-02-22 DIAGNOSIS — E119 Type 2 diabetes mellitus without complications: Secondary | ICD-10-CM

## 2018-02-22 DIAGNOSIS — B20 Human immunodeficiency virus [HIV] disease: Secondary | ICD-10-CM | POA: Insufficient documentation

## 2018-02-22 DIAGNOSIS — Z794 Long term (current) use of insulin: Secondary | ICD-10-CM | POA: Diagnosis not present

## 2018-02-22 MED ORDER — EMTRICITAB-RILPIVIR-TENOFOV AF 200-25-25 MG PO TABS: 1.0000 | ORAL_TABLET | Freq: Every day | ORAL | 11 refills | Status: DC

## 2018-02-22 NOTE — Progress Notes (Signed)
Patient ID: [name], male    DOB: 25-Aug-1965, 52 y.o.   MRN: [id number]  Chief complaint  52 year old Caucasian man with HIV perfectly controlled with ODefsey .He does have comorbid CAD, DM,  HTN.    Lab Results  Component Value Date   HIV1RNAQUANT 34 (H) 11/30/2016   HIV1RNAQUANT <20 10/28/2015   HIV1RNAQUANT <20 09/13/2014    Lab Results  Component Value Date   CD4TABS 1,350 11/30/2016   CD4TABS 980 10/28/2015   CD4TABS 1,470 09/13/2014   Past Medical History:  Diagnosis Date  . CAD (coronary artery disease)    DES.  /  ... Later (10/2007) intervention for in-stent restenosis ;  EF 60%... catheterization... June, 2009  . Diabetes mellitus type 2, uncontrolled ([place]) 09/27/2014  . Diabetes mellitus type II   . DKA (diabetic ketoacidoses) ([place]) 12/2010; 03/23/2017  . Dyslipidemia   . Ejection fraction    EF 60%, catheterization, 2009  . History of kidney stones   . HIV (human immunodeficiency virus infection) ([place]) dx'd ~ 2002/2003  . HTN (hypertension)   . 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  . Nausea and vomiting 03/23/2017  . 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 RPR  reverted to negative  . Sinus tachycardia    mild at rest.... October, 2010  . Syphilitic hepatitis     Past Surgical History:  Procedure Laterality Date  . CHOLECYSTECTOMY OPEN  07/2001  . COLONOSCOPY N/A 05/28/2017   Procedure: COLONOSCOPY;  Surgeon: Rogene Houston, MD;  [facility];  Service: Endoscopy;  Laterality: N/A;  200 - pt to prep in Endo  . CORONARY ANGIOPLASTY WITH STENT PLACEMENT  03/2006; 10/2007;   . HERNIA REPAIR    . LAPAROSCOPIC INCISIONAL / UMBILICAL / VENTRAL HERNIA REPAIR  07/2001   UHR  . POLYPECTOMY  05/28/2017   Procedure: POLYPECTOMY;  Surgeon: Rogene Houston, MD;  [facility];  Service: Endoscopy;;  colon    Family History  Problem  Relation Age of Onset  . Heart attack Father        7 heart attacks  . Anemia Unknown   . Diabetes Unknown   . CAD Sister   . Diabetes Sister       Social History   Socioeconomic History  . Marital status: Single    Spouse name: Not on file  . Number of children: Not on file  . Years of education: Not on file  . Highest education level: Not on file  Occupational History  . Not on file  Social Needs  . Financial resource strain: Not on file  . Food insecurity:    Worry: Not on file    Inability: Not on file  . Transportation needs:    Medical: Not on file    Non-medical: Not on file  Tobacco Use  . Smoking status: Never Smoker  . Smokeless tobacco: Never Used  Substance and Sexual Activity  . Alcohol use: Yes    Alcohol/week: 0.0 - 1.0 standard drinks    Comment: 03/23/2017 "might have a drink 4-6 times/year"  . Drug use: No  . Sexual activity: Not Currently  Lifestyle  . Physical activity:    Days per week: Not on file    Minutes per session: Not on file  . Stress: Not on file  Relationships  . Social connections:    Talks on phone: Not  on file    Gets together: Not on file    Attends religious service: Not on file    Active member of club or organization: Not on file    Attends meetings of clubs or organizations: Not on file    Relationship status: Not on file  Other Topics Concern  . Not on file  Social History Narrative  . Not on file    Allergies  Allergen Reactions  . Pepcid [Famotidine] Other (See Comments)    Contraindicated with ODEFSEY  . Prilosec [Omeprazole] Other (See Comments)    Contraindicated with RPV (lowers level of this ARV in ODEFSEY     Current Outpatient Medications:  .  aspirin 81 MG tablet, Take 1 tablet (81 mg total) by mouth every evening., Disp: 30 tablet, Rfl:  .  chlorthalidone (HYGROTON) 25 MG tablet, Take 1 tablet (25 mg total) by mouth daily., Disp: 90 tablet, Rfl: 2 .  insulin glargine (LANTUS SOLOSTAR) 100 UNIT/ML  injection, Inject 50 Units into the skin at bedtime., Disp: , Rfl:  .  insulin lispro (HUMALOG) 100 UNIT/ML injection, Inject 8-14 Units into the skin 3 (three) times daily before meals., Disp: , Rfl:  .  lisinopril (PRINIVIL,ZESTRIL) 10 MG tablet, Take 1 tablet (10 mg total) by mouth daily. (Patient taking differently: Take 10 mg by mouth every evening. ), Disp: 90 tablet, Rfl: 2 .  metoprolol (TOPROL-XL) 100 MG 24 hr tablet, Take 1 tablet (100 mg total) by mouth daily. (Patient taking differently: Take 100 mg by mouth every evening. ), Disp: 90 tablet, Rfl: 3 .  ODEFSEY 200-25-25 MG TABS tablet, TAKE 1 TABLET BY MOUTH DAILY WITH BREAKFAST. PLEASE CALL TO SCHEDULE YOUR FOLLOW UP [phone], Disp: 30 tablet, Rfl: 5 .  rosuvastatin (CRESTOR) 40 MG tablet, Take 1 tablet (40 mg total) by mouth daily. (Patient taking differently: Take 40 mg by mouth every evening. ), Disp: 90 tablet, Rfl: 2    Review of Systems  Constitutional: Negative for activity change, appetite change, chills, diaphoresis, fatigue, fever and unexpected weight change.  HENT: Negative for congestion, rhinorrhea, sinus pressure, sneezing, sore throat and trouble swallowing.   Eyes: Negative for photophobia and visual disturbance.  Respiratory: Negative for cough, chest tightness, shortness of breath, wheezing and stridor.   Cardiovascular: Negative for chest pain, palpitations and leg swelling.  Gastrointestinal: Negative for abdominal distention, abdominal pain, anal bleeding, blood in stool, constipation, diarrhea, nausea and vomiting.  Endocrine: Negative for polydipsia, polyphagia and polyuria.  Genitourinary: Negative for difficulty urinating, dysuria, flank pain and hematuria.  Musculoskeletal: Negative for arthralgias, back pain, gait problem, joint swelling and myalgias.  Skin: Negative for color change, pallor, rash and wound.  Neurological: Negative for dizziness, tremors, weakness and light-headedness.  Hematological:  Negative for adenopathy. Does not bruise/bleed easily.  Psychiatric/Behavioral: Negative for agitation, behavioral problems, confusion, decreased concentration, dysphoric mood and sleep disturbance.       Objective:   Physical Exam  Constitutional: He is oriented to person, place, and time. He appears well-developed. No distress.  Pale and anxious  HENT:  Head: Normocephalic and atraumatic.  Mouth/Throat: Oropharynx is clear and moist. No oropharyngeal exudate.  Eyes: Pupils are equal, round, and reactive to light. Conjunctivae and EOM are normal. No scleral icterus.  Neck: Normal range of motion. Neck supple. No JVD present.  Cardiovascular: Normal rate, regular rhythm and normal heart sounds. Exam reveals no gallop and no friction rub.  No murmur heard. Pulmonary/Chest: Effort normal and breath sounds normal. No  respiratory distress. He has no wheezes. He has no rales. He exhibits no tenderness.  Abdominal: Soft. Bowel sounds are normal. He exhibits no distension and no mass. There is no tenderness. There is no rebound and no guarding.  Musculoskeletal: He exhibits no edema or tenderness.  Lymphadenopathy:    He has no cervical adenopathy.  Neurological: He is alert and oriented to person, place, and time. He has normal reflexes. He exhibits normal muscle tone. Coordination normal.  Skin: Skin is warm and dry. He is not diaphoretic. No erythema. No pallor.  Psychiatric: His behavior is normal. Judgment and thought content normal. His mood appears anxious.  Nursing note and vitals reviewed.         Assessment & Plan:    [id number] controlled with ODEFSEY. Hilliard Clark is interested in [place] RPV and CAB when available   DM:  Hyperlipidemia: "" on statin   CAD: on beta blocker, acei, statin, asa, I was going to add troponion but I cannot run it fast out of my lab and would defer to inpatient team

## 2018-02-23 LAB — T-HELPER CELL (CD4) - (RCID CLINIC ONLY)
CD4 % Helper T Cell: 37 % (ref 33–55)
CD4 T Cell Abs: 1050 /uL (ref 400–2700)

## 2018-02-23 LAB — URINE CYTOLOGY ANCILLARY ONLY
Chlamydia: NEGATIVE
Neisseria Gonorrhea: NEGATIVE

## 2018-02-24 LAB — COMPLETE METABOLIC PANEL WITH GFR
AG Ratio: 1.8 (calc) (ref 1.0–2.5)
ALT: 31 U/L (ref 9–46)
AST: 17 U/L (ref 10–35)
Albumin: 4.1 g/dL (ref 3.6–5.1)
Alkaline phosphatase (APISO): 60 U/L (ref 40–115)
BUN: 15 mg/dL (ref 7–25)
CO2: 25 mmol/L (ref 20–32)
Calcium: 9.5 mg/dL (ref 8.6–10.3)
Chloride: 102 mmol/L (ref 98–110)
Creat: 0.82 mg/dL (ref 0.70–1.33)
GFR, Est African American: 118 mL/min/{1.73_m2} (ref >=60)
GFR, Est Non African American: 102 mL/min/{1.73_m2} (ref >=60)
Globulin: 2.3 g/dL (calc) (ref 1.9–3.7)
Glucose, Bld: 149 mg/dL — ABNORMAL HIGH (ref 65–99)
Potassium: 4 mmol/L (ref 3.5–5.3)
Sodium: 138 mmol/L (ref 135–146)
Total Bilirubin: 0.3 mg/dL (ref 0.2–1.2)
Total Protein: 6.4 g/dL (ref 6.1–8.1)

## 2018-02-24 LAB — LIPID PANEL
Cholesterol: 135 mg/dL (ref <200)
HDL: 32 mg/dL — ABNORMAL LOW (ref >40)
LDL Cholesterol (Calc): 66 mg/dL (calc)
Non-HDL Cholesterol (Calc): 103 mg/dL (calc) (ref <130)
Total CHOL/HDL Ratio: 4.2 (calc) (ref <5.0)
Triglycerides: 278 mg/dL — ABNORMAL HIGH (ref <150)

## 2018-02-24 LAB — CBC WITH DIFFERENTIAL/PLATELET
Basophils Absolute: 51 cells/uL (ref 0–200)
Basophils Relative: 0.7 %
Eosinophils Absolute: 241 cells/uL (ref 15–500)
Eosinophils Relative: 3.3 %
HCT: 44.7 % (ref 38.5–50.0)
Hemoglobin: 15.4 g/dL (ref 13.2–17.1)
Lymphs Abs: 2949 cells/uL (ref 850–3900)
MCH: 28.6 pg (ref 27.0–33.0)
MCHC: 34.5 g/dL (ref 32.0–36.0)
MCV: 82.9 fL (ref 80.0–100.0)
MPV: 9.4 fL (ref 7.5–12.5)
Monocytes Relative: 8.8 %
Neutro Abs: 3416 cells/uL (ref 1500–7800)
Neutrophils Relative %: 46.8 %
Platelets: 286 10*3/uL (ref 140–400)
RBC: 5.39 10*6/uL (ref 4.20–5.80)
RDW: 12.8 % (ref 11.0–15.0)
Total Lymphocyte: 40.4 %
WBC mixed population: 642 cells/uL (ref 200–950)
WBC: 7.3 10*3/uL (ref 3.8–10.8)

## 2018-02-24 LAB — HIV-1 RNA QUANT-NO REFLEX-BLD
HIV 1 RNA Quant: <20 copies/mL — AB
HIV-1 RNA Quant, Log: <1.3 Log copies/mL — AB

## 2018-02-24 LAB — RPR: RPR Ser Ql: NONREACTIVE

## 2018-02-28 ENCOUNTER — Ambulatory Visit (INDEPENDENT_AMBULATORY_CARE_PROVIDER_SITE_OTHER): Payer: 59 | Admitting: "Endocrinology

## 2018-02-28 ENCOUNTER — Encounter: Payer: Self-pay | Admitting: "Endocrinology

## 2018-02-28 VITALS — BP 125/83 | HR 80 | Ht 66.0 in | Wt 204.0 lb

## 2018-02-28 DIAGNOSIS — E1165 Type 2 diabetes mellitus with hyperglycemia: Secondary | ICD-10-CM

## 2018-02-28 DIAGNOSIS — E782 Mixed hyperlipidemia: Secondary | ICD-10-CM

## 2018-02-28 DIAGNOSIS — I10 Essential (primary) hypertension: Secondary | ICD-10-CM | POA: Diagnosis not present

## 2018-02-28 NOTE — Patient Instructions (Signed)

## 2018-02-28 NOTE — Progress Notes (Signed)
Endocrinology follow-up note       02/28/2018, 1:33 PM   Subjective:    Patient ID: [name], male    DOB: 04-25-1966.  [name] is being seen in consultation for management of currently uncontrolled symptomatic type 2 diabetes  requested by  Asencion Noble, MD.   Past Medical History:  Diagnosis Date  . CAD (coronary artery disease)    DES.  /  ... Later (10/2007) intervention for in-stent restenosis ;  EF 60%... catheterization... June, 2009  . Diabetes mellitus type 2, uncontrolled ([place]) 09/27/2014  . Diabetes mellitus type II   . DKA (diabetic ketoacidoses) ([place]) 12/2010; 03/23/2017  . Dyslipidemia   . Ejection fraction    EF 60%, catheterization, 2009  . History of kidney stones   . HIV (human immunodeficiency virus infection) ([place]) dx'd ~ 2002/2003  . HTN (hypertension)   . 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  . Nausea and vomiting 03/23/2017  . 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 RPR  reverted to negative  . Sinus tachycardia    mild at rest.... October, 2010  . Syphilitic hepatitis    Past Surgical History:  Procedure Laterality Date  . CHOLECYSTECTOMY OPEN  07/2001  . COLONOSCOPY N/A 05/28/2017   Procedure: COLONOSCOPY;  Surgeon: Rogene Houston, MD;  Location: [facility];  Service: Endoscopy;  Laterality: N/A;  200 - pt to prep in Endo  . CORONARY ANGIOPLASTY WITH STENT PLACEMENT  03/2006; 10/2007;   . HERNIA REPAIR    . LAPAROSCOPIC INCISIONAL / UMBILICAL / VENTRAL HERNIA REPAIR  07/2001   UHR  . POLYPECTOMY  05/28/2017   Procedure: POLYPECTOMY;  Surgeon: Rogene Houston, MD;  Location: [facility];  Service: Endoscopy;;  colon   Social History   Socioeconomic History  . Marital status: Single    Spouse name: Not on file  . Number of children: Not on file  .  Years of education: Not on file  . Highest education level: Not on file  Occupational History  . Not on file  Social Needs  . Financial resource strain: Not on file  . Food insecurity:    Worry: Not on file    Inability: Not on file  . Transportation needs:    Medical: Not on file    Non-medical: Not on file  Tobacco Use  . Smoking status: Never Smoker  . Smokeless tobacco: Never Used  Substance and Sexual Activity  . Alcohol use: Yes    Alcohol/week: 0.0 - 1.0 standard drinks    Comment: 03/23/2017 "might have a drink 4-6 times/year"  . Drug use: No  . Sexual activity: Not Currently  Lifestyle  . Physical activity:    Days per week: Not on file    Minutes per session: Not on file  . Stress: Not on file  Relationships  . Social connections:    Talks on phone: Not on file    Gets together: Not on file    Attends religious service: Not on file    Active member  of club or organization: Not on file    Attends meetings of clubs or organizations: Not on file    Relationship status: Not on file  Other Topics Concern  . Not on file  Social History Narrative  . Not on file   Outpatient Encounter Medications as of 02/28/2018  Medication Sig  . aspirin 81 MG tablet Take 1 tablet (81 mg total) by mouth every evening.  . chlorthalidone (HYGROTON) 25 MG tablet Take 1 tablet (25 mg total) by mouth daily.  [name] emtricitabine-rilpivir-tenofovir AF (ODEFSEY) 200-25-25 MG TABS tablet Take 1 tablet by mouth daily with breakfast.  . insulin glargine (LANTUS SOLOSTAR) 100 UNIT/ML injection Inject 60 Units into the skin at bedtime.  . insulin lispro (HUMALOG) 100 UNIT/ML injection Inject 10-16 Units into the skin 3 (three) times daily before meals.  [name] lisinopril (PRINIVIL,ZESTRIL) 10 MG tablet Take 1 tablet (10 mg total) by mouth daily. (Patient taking differently: Take 10 mg by mouth every evening. )  . metoprolol (TOPROL-XL) 100 MG 24 hr tablet Take 1 tablet (100 mg total) by mouth daily.  (Patient taking differently: Take 100 mg by mouth every evening. )  . rosuvastatin (CRESTOR) 40 MG tablet Take 1 tablet (40 mg total) by mouth daily. (Patient taking differently: Take 40 mg by mouth every evening. )   No facility-administered encounter medications on file as of 02/28/2018.     ALLERGIES: Allergies  Allergen Reactions  . Pepcid [Famotidine] Other (See Comments)    Contraindicated with ODEFSEY  . Prilosec [Omeprazole] Other (See Comments)    Contraindicated with RPV (lowers level of this ARV in ODEFSEY    VACCINATION STATUS: Immunization History  Administered Date(s) Administered  . Hepatitis A 07/22/2007  . Influenza Whole 01/31/2013  . Influenza, Quadrivalent, Recombinant, Inj, Pf 01/18/2017  . Influenza-Unspecified 02/25/2017  . Pneumococcal Conjugate-13 09/13/2012  . Td 05/25/1996    Diabetes  He presents for his follow-up diabetic visit. He has type 2 diabetes mellitus. His disease course has been worsening. There are no hypoglycemic associated symptoms. Pertinent negatives for hypoglycemia include no confusion, headaches, pallor or seizures. Associated symptoms include polydipsia and polyuria. Pertinent negatives for diabetes include no chest pain, no fatigue, no polyphagia and no weakness. There are no hypoglycemic complications. Symptoms are worsening. Diabetic complications include heart disease. Risk factors for coronary artery disease include diabetes mellitus, dyslipidemia, family history, obesity, male sex, hypertension and sedentary lifestyle. Current diabetic treatment includes insulin injections (He is currently on Lantus 45 units twice a day, Humalog 8 units 3 times daily AC.). His weight is fluctuating dramatically. He is following a generally unhealthy diet. When asked about meal planning, he reported none. He has had a previous visit with a dietitian. He participates in exercise intermittently. His breakfast blood glucose range is generally >200 mg/dl. His  lunch blood glucose range is generally >200 mg/dl. His dinner blood glucose range is generally >200 mg/dl. His bedtime blood glucose range is generally >200 mg/dl. His overall blood glucose range is >200 mg/dl. (He wears a CGM sensor.  Document fluctuating blood glucose profile.  His most recent A1c was 9.8%.  His prior A1c measurements where or above target at 10.6%, 10.8%, 9.6%.) An ACE inhibitor/angiotensin II receptor blocker is being taken. Eye exam is current.  Hyperlipidemia  This is a chronic problem. The current episode started more than 1 year ago. The problem is uncontrolled. Recent lipid tests were reviewed and are high. Exacerbating diseases include diabetes and obesity. Pertinent negatives include no  chest pain, myalgias or shortness of breath. Risk factors for coronary artery disease include dyslipidemia, diabetes mellitus, hypertension, a sedentary lifestyle, male sex, family history and obesity.  Hypertension  This is a chronic problem. The current episode started more than 1 year ago. The problem is controlled. Pertinent negatives include no chest pain, headaches, neck pain, palpitations or shortness of breath. Past treatments include ACE inhibitors, beta blockers and diuretics. Hypertensive end-organ damage includes CAD/MI.     Review of Systems  Constitutional: Negative for chills, fatigue, fever and unexpected weight change.  HENT: Negative for dental problem, mouth sores and trouble swallowing.   Eyes: Negative for visual disturbance.  Respiratory: Negative for cough, choking, chest tightness, shortness of breath and wheezing.   Cardiovascular: Negative for chest pain, palpitations and leg swelling.  Gastrointestinal: Negative for abdominal distention, abdominal pain, constipation, diarrhea, nausea and vomiting.  Endocrine: Positive for polydipsia and polyuria. Negative for polyphagia.  Genitourinary: Negative for dysuria, flank pain, hematuria and urgency.  Musculoskeletal:  Negative for back pain, gait problem, myalgias and neck pain.  Skin: Negative for pallor, rash and wound.  Allergic/Immunologic: Positive for immunocompromised state.  Neurological: Negative for seizures, syncope, weakness, numbness and headaches.  Psychiatric/Behavioral: Negative for confusion and dysphoric mood.    Objective:    BP 125/83   Pulse 80   Ht 5\' 6"  (1.676 m)   Wt 204 lb (92.5 kg)   BMI 32.93 kg/m   Wt Readings from Last 3 Encounters:  02/28/18 204 lb (92.5 kg)  02/22/18 203 lb 1.9 oz (92.1 kg)  02/08/18 206 lb (93.4 kg)     Physical Exam  Constitutional: He is oriented to person, place, and time. He appears well-developed and well-nourished. He is cooperative. No distress.  HENT:  Head: Normocephalic and atraumatic.  Eyes: EOM are normal.  Neck: Normal range of motion. Neck supple. No tracheal deviation present. No thyromegaly present.  Cardiovascular: Normal rate, S1 normal, S2 normal and normal heart sounds. Exam reveals no gallop.  No murmur heard. Pulses:      Dorsalis pedis pulses are 1+ on the right side, and 1+ on the left side.       Posterior tibial pulses are 1+ on the right side, and 1+ on the left side.  Pulmonary/Chest: Breath sounds normal. No respiratory distress. He has no wheezes.  Abdominal: Soft. Bowel sounds are normal. He exhibits no distension. There is no tenderness. There is no guarding and no CVA tenderness.  Musculoskeletal: He exhibits no edema.       Right shoulder: He exhibits no swelling and no deformity.  Neurological: He is alert and oriented to person, place, and time. He has normal strength and normal reflexes. No cranial nerve deficit or sensory deficit. Gait normal.  Skin: Skin is warm and dry. No rash noted. No cyanosis. Nails show no clubbing.  Psychiatric: He has a normal mood and affect. His speech is normal. Judgment normal. Cognition and memory are normal.   CMP     Component Value Date/Time   NA 138 02/22/2018 1025    K 4.0 02/22/2018 1025   CL 102 02/22/2018 1025   CO2 25 02/22/2018 1025   GLUCOSE 149 (H) 02/22/2018 1025   BUN 15 02/22/2018 1025   CREATININE 0.82 02/22/2018 1025   CALCIUM 9.5 02/22/2018 1025   PROT 6.4 02/22/2018 1025   ALBUMIN 4.0 03/23/2017 1524   AST 17 02/22/2018 1025   ALT 31 02/22/2018 1025   ALKPHOS 72 03/23/2017 1524  BILITOT 0.3 02/22/2018 1025   GFRNONAA 102 02/22/2018 1025   GFRAA 118 02/22/2018 1025     Diabetic Labs (most recent): Lab Results  Component Value Date   HGBA1C 9.8 (A) 12/13/2017   HGBA1C 10.6 (H) 03/23/2017   HGBA1C 8.5 03/30/2009     Lipid Panel ( most recent) Lipid Panel     Component Value Date/Time   CHOL 135 02/22/2018 1025   TRIG 278 (H) 02/22/2018 1025   HDL 32 (L) 02/22/2018 1025   CHOLHDL 4.2 02/22/2018 1025   VLDL 59 (H) 10/28/2015 0957   LDLCALC 66 02/22/2018 1025       Assessment & Plan:   1. Uncontrolled type 2 diabetes mellitus with hyperglycemia ([place])  - [name] has currently uncontrolled symptomatic type 2 DM since 52 years of age,  with most recent A1c of 9.8 %.   -He presents with significantly fluctuating blood glucose profile, significantly above target average.  No major hypoglycemia.    -his diabetes is complicated by coronary artery disease status post stent placement, diabetes ketoacidosis at least on 2 occasions last one in October 2018, and he remains at a high risk for more acute and chronic complications which include CAD, CVA, CKD, retinopathy, and neuropathy. These are all discussed in detail with him.  - I have counseled him on diet management and weight loss, by adopting a carbohydrate restricted/protein rich diet.  -He admits to dietary discretion including consumption of sweetened beverages.  -  Suggestion is made for him to avoid simple carbohydrates  from his diet including Cakes, Sweet Desserts / Pastries, Ice Cream, Soda (diet and regular), Sweet Tea, Candies, Chips, Cookies, Store Bought  Juices, Alcohol in Excess of  1-2 drinks a day, Artificial Sweeteners, and "Sugar-free" Products. This will help patient to have stable blood glucose profile and potentially avoid unintended weight gain.  - I encouraged him to switch to  unprocessed or minimally processed complex starch and increased protein intake (animal or plant source), fruits, and vegetables.  - he is advised to stick to a routine mealtimes to eat 3 meals  a day and avoid unnecessary snacks ( to snack only to correct hypoglycemia).  - I have approached him with the following individualized plan to manage diabetes and patient agrees:   -Regarding his prevailing glycemic burden, he will continue to require intensive treatment with basal/bolus insulin in order for him to achieve and maintain control of diabetes to target.   -He is advised to increase his Lantus to 60 units nightly,  readjust his prandial insulin Humalog to 10 units 3 times a day with meals  for pre-meal BG readings of 70-150mg /dl, plus patient specific correction dose for unexpected hyperglycemia above 150mg /dl, associated with strict monitoring of glucose 4 times a day-before meals and at bedtime.  He is currently using the Freestyle Libre CGM device. - He is warned not to take insulin without proper monitoring per orders. -Adjustment parameters are given for hypo and hyperglycemia in writing. - he is encouraged to call clinic for blood glucose levels less than 70 or above 200 mg /dl.  -He may benefit from slow introduction of GLP-1 receptor agonists in hope of minimizing his insulin burden going forward.  He will be considered for Trulicity or Ozempic during his next visit. - Patient specific target  A1c;  LDL, HDL, Triglycerides, and  Waist Circumference were discussed in detail.  2) BP/HTN:  his blood pressure is controlled to target.   he is advised  to continue his current medications including lisinopril 10  Mg p.o. daily with breakfast . 3) Lipids/HPL:    Review of his recent lipid panel showed  controlled  LDL at 42.  he  is advised to continue Crestor 40 mg daily at bedtime.  Side effects and precautions discussed with him.  4)  Weight/Diet:  Body mass index is 32.93 kg/m.  - clearly complicating his diabetes care.  I discussed with him the fact that loss of 5 - 10% of his  current body weight will have the most impact on his diabetes management.  He declined CDE Consult . Exercise, and detailed carbohydrates information provided  -  detailed on discharge instructions.  5) Chronic Care/Health Maintenance:  -he  is on ACEI/ARB and Statin medications and  is encouraged to initiate and continue to follow up with Ophthalmology, Dentist, infectious diseases given his history of HIV infection,  podiatrist at least yearly or according to recommendations, and advised to  stay away from smoking. I have recommended yearly flu vaccine and pneumonia vaccine at least every 5 years; moderate intensity exercise for up to 150 minutes weekly; and  sleep for at least 7 hours a day.  - I advised patient to maintain close follow up with Asencion Noble, MD for primary care needs.  - Time spent with the patient: 25 min, of which >50% was spent in reviewing his blood glucose logs , discussing his hypo- and hyper-glycemic episodes, reviewing his current and  previous labs and insulin doses and developing a plan to avoid hypo- and hyper-glycemia. Please refer to Patient Instructions for Blood Glucose Monitoring and Insulin/Medications Dosing Guide"  in media tab for additional information. [name] participated in the discussions, expressed understanding, and voiced agreement with the above plans.  All questions were answered to his satisfaction. he is encouraged to contact clinic should he have any questions or concerns prior to his return visit.  Follow up plan: - Return in about 5 weeks (around 04/04/2018) for Follow up with Pre-visit Labs, Meter, and Logs.  Glade Lloyd, MD [facility] [address] [place], [place] [address] Phone: [phone]  Fax: [phone]    02/28/2018, 1:33 PM  This note was partially dictated with voice recognition software. Similar sounding words can be transcribed inadequately or may not  be corrected upon review.

## 2018-03-03 ENCOUNTER — Encounter (INDEPENDENT_AMBULATORY_CARE_PROVIDER_SITE_OTHER): Payer: 59

## 2018-03-07 MED FILL — ODEFSEY 200-25-25 MG TABS: 200-25-25 | 30 days supply | Qty: 30 | Fill #0

## 2018-03-07 MED FILL — SHIPPING COST: 1 days supply | Qty: 1 | Fill #11

## 2018-03-14 ENCOUNTER — Encounter (INDEPENDENT_AMBULATORY_CARE_PROVIDER_SITE_OTHER): Payer: Self-pay | Admitting: Family Medicine

## 2018-03-14 ENCOUNTER — Ambulatory Visit (INDEPENDENT_AMBULATORY_CARE_PROVIDER_SITE_OTHER): Payer: 59 | Admitting: Family Medicine

## 2018-03-14 ENCOUNTER — Other Ambulatory Visit (INDEPENDENT_AMBULATORY_CARE_PROVIDER_SITE_OTHER): Payer: Self-pay | Admitting: Family Medicine

## 2018-03-14 VITALS — BP 128/78 | HR 72 | Temp 97.7°F | Ht 66.0 in | Wt 199.0 lb

## 2018-03-14 DIAGNOSIS — Z1331 Encounter for screening for depression: Secondary | ICD-10-CM

## 2018-03-14 DIAGNOSIS — I10 Essential (primary) hypertension: Secondary | ICD-10-CM

## 2018-03-14 DIAGNOSIS — R5383 Other fatigue: Secondary | ICD-10-CM | POA: Diagnosis not present

## 2018-03-14 DIAGNOSIS — E119 Type 2 diabetes mellitus without complications: Secondary | ICD-10-CM

## 2018-03-14 DIAGNOSIS — E66811 Obesity, class 1: Secondary | ICD-10-CM

## 2018-03-14 DIAGNOSIS — E669 Obesity, unspecified: Secondary | ICD-10-CM | POA: Diagnosis not present

## 2018-03-14 DIAGNOSIS — R0602 Shortness of breath: Secondary | ICD-10-CM

## 2018-03-14 DIAGNOSIS — Z0289 Encounter for other administrative examinations: Secondary | ICD-10-CM

## 2018-03-14 DIAGNOSIS — Z794 Long term (current) use of insulin: Secondary | ICD-10-CM | POA: Diagnosis not present

## 2018-03-14 DIAGNOSIS — Z9189 Other specified personal risk factors, not elsewhere classified: Secondary | ICD-10-CM

## 2018-03-14 DIAGNOSIS — Z6832 Body mass index (BMI) 32.0-32.9, adult: Secondary | ICD-10-CM

## 2018-03-14 DIAGNOSIS — E559 Vitamin D deficiency, unspecified: Secondary | ICD-10-CM

## 2018-03-15 LAB — HEMOGLOBIN A1C
Est. average glucose Bld gHb Est-mCnc: 252 mg/dL
Hgb A1c MFr Bld: 10.4 % — ABNORMAL HIGH (ref 4.8–5.6)

## 2018-03-15 LAB — VITAMIN D 25 HYDROXY (VIT D DEFICIENCY, FRACTURES): Vit D, 25-Hydroxy: 9.4 ng/mL — ABNORMAL LOW (ref 30.0–100.0)

## 2018-03-15 LAB — C-PEPTIDE: C-Peptide: <0.1 ng/mL — ABNORMAL LOW (ref 1.1–4.4)

## 2018-03-15 LAB — FOLATE: Folate: 18.4 ng/mL (ref >3.0)

## 2018-03-15 LAB — MICROALBUMIN / CREATININE URINE RATIO
Creatinine, Urine: 104.3 mg/dL
Microalb/Creat Ratio: 49.8 mg/g creat — ABNORMAL HIGH (ref 0.0–30.0)
Microalbumin, Urine: 51.9 ug/mL

## 2018-03-15 LAB — TSH: TSH: 1.16 u[IU]/mL (ref 0.450–4.500)

## 2018-03-15 LAB — T3: T3, Total: 127 ng/dL (ref 71–180)

## 2018-03-15 LAB — T4, FREE: Free T4: 1.41 ng/dL (ref 0.82–1.77)

## 2018-03-15 LAB — VITAMIN B12: Vitamin B-12: 376 pg/mL (ref 232–1245)

## 2018-03-15 MED FILL — FREESTYLE LIBRE 14 DAY SENS: 84 days supply | Qty: 6 | Fill #2

## 2018-03-16 MED FILL — SHIPPING COST: 1 days supply | Qty: 1 | Fill #12

## 2018-03-16 NOTE — Progress Notes (Signed)
Office: [phone]  /  Fax: [phone]   Dear Dr. Willey Blade,   Thank you for referring [name] to our clinic. The following note includes my evaluation and treatment recommendations.  HPI:   Chief Complaint: OBESITY    [name] has been referred by Asencion Noble, MD for consultation regarding his obesity and obesity related comorbidities.    [name] (MR# [id number]) is a 52 y.o. male who presents on 03/16/2018 for obesity evaluation and treatment. Current BMI is Body mass index is 32.12 kg/m.[name] [name] has been struggling with his weight for many years and has been unsuccessful in either losing weight, maintaining weight loss, or reaching his healthy weight goal. [name] notes, he started to gain weight after starting insulin approximately ten years ago.     [name] attended our information session and states he is currently in the action stage of change and ready to dedicate time achieving and maintaining a healthier weight. [name] is interested in becoming our patient and working on intensive lifestyle modifications including (but not limited to) diet, exercise and weight loss.    [name] states his desired weight loss is 49 lbs he started gaining weight 10 yrs ago his heaviest weight ever was 210 lbs. he has significant food cravings issues  he skips meals frequently he struggles with emotional eating    Fatigue [name] feels his energy is lower than it should be. This has worsened with weight gain and has not worsened recently. [name] admits to daytime somnolence and  denies waking up still tired. Patient is at risk for obstructive sleep apnea. Patent has a history of symptoms of daytime fatigue and hypertension. Patient generally gets 8 or 9 hours of sleep per night, and states they generally have  restful sleep. Snoring is present. Apneic episodes are not present. Epworth Sleepiness Score is 8  Dyspnea on exertion [name] notes increasing shortness of breath with exercising and  seems to be worsening over time with weight gain. He notes getting out of breath sooner with activity than he used to. This has not gotten worse recently. [name] denies orthopnea.  Diabetes II on insulin [name] has a diagnosis of diabetes type II. Patient states, his endocrinologist was questioning if his diabetes may be type I. No recent C-peptide test was done. [name] is on Lantus 60 units every night and sliding scale Humalog insulin at 170 or below 11 units and 170 to 200 12 units. His last A1c was uncontrolled at 9.8. [name] denies any hypoglycemic episodes. He  Is attempting to work on intensive lifestyle modifications including diet, exercise, and weight loss to help control his blood glucose levels.  Vitamin D deficiency [name] has a diagnosis of vitamin D deficiency. [name] is not currently taking vit D and he admits fatigue, but he denies nausea, vomiting or muscle weakness.  At risk for osteopenia and osteoporosis [name] is at higher risk of osteopenia and osteoporosis due to vitamin D deficiency.   Hypertension [name] is a 52 y.o. male with hypertension. He is on Lisinopril and HCTZ. [name] denies chest pain or headache. [name] states he is status post MI. He is working weight loss to help control his blood pressure with the goal of decreasing his risk of heart attack and stroke. [name] blood pressure is stable today.  Depression Screen [name]'s Food and Mood (modified PHQ-9) score was  Depression screen PHQ 2/9 03/14/2018  Decreased Interest 2  Down, Depressed, Hopeless 1  PHQ - 2  Score 3  Altered sleeping 1  Tired, decreased energy 2  Change in appetite 1  Feeling bad or failure about yourself  0  Trouble concentrating 2  Moving slowly or fidgety/restless 0  Suicidal thoughts 0  PHQ-9 Score 9  Difficult doing work/chores Not difficult at all    ALLERGIES: Allergies  Allergen Reactions  . Pepcid [Famotidine] Other (See Comments)    Contraindicated with ODEFSEY    . Prilosec [Omeprazole] Other (See Comments)    Contraindicated with RPV (lowers level of this ARV in ODEFSEY    MEDICATIONS: Current Outpatient Medications on File Prior to Visit  Medication Sig Dispense Refill  . aspirin 81 MG tablet Take 1 tablet (81 mg total) by mouth every evening. 30 tablet   . chlorthalidone (HYGROTON) 25 MG tablet Take 1 tablet (25 mg total) by mouth daily. 90 tablet 2  . emtricitabine-rilpivir-tenofovir AF (ODEFSEY) 200-25-25 MG TABS tablet Take 1 tablet by mouth daily with breakfast. 30 tablet 11  . insulin glargine (LANTUS SOLOSTAR) 100 UNIT/ML injection Inject 60 Units into the skin at bedtime.    . insulin lispro (HUMALOG) 100 UNIT/ML injection Inject 10-16 Units into the skin 3 (three) times daily before meals.    [name] lisinopril (PRINIVIL,ZESTRIL) 10 MG tablet Take 1 tablet (10 mg total) by mouth daily. (Patient taking differently: Take 10 mg by mouth every evening. ) 90 tablet 2  . rosuvastatin (CRESTOR) 40 MG tablet Take 1 tablet (40 mg total) by mouth daily. (Patient taking differently: Take 40 mg by mouth every evening. ) 90 tablet 2   No current facility-administered medications on file prior to visit.     PAST MEDICAL HISTORY: Past Medical History:  Diagnosis Date  . CAD (coronary artery disease)    DES.  /  ... Later (10/2007) intervention for in-stent restenosis ;  EF 60%... catheterization... June, 2009  . Chest pain   . Diabetes mellitus type 2, uncontrolled ([place]) 09/27/2014  . Diabetes mellitus type II   . DKA (diabetic ketoacidoses) ([place]) 12/2010; 03/23/2017  . Dyslipidemia   . Ejection fraction    EF 60%, catheterization, 2009  . Gallbladder problem   . History of heart attack   . History of kidney stones   . HIV (human immunodeficiency virus infection) ([place]) dx'd ~ 2002/2003  . HLD (hyperlipidemia)   . HTN (hypertension)   . 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  . Nausea and vomiting  03/23/2017  . 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 RPR  reverted to negative  . Sinus tachycardia    mild at rest.... October, 2010  . Syphilitic hepatitis     PAST SURGICAL HISTORY: Past Surgical History:  Procedure Laterality Date  . CHOLECYSTECTOMY OPEN  07/2001  . COLONOSCOPY N/A 05/28/2017   Procedure: COLONOSCOPY;  Surgeon: Rogene Houston, MD;  Location: [facility];  Service: Endoscopy;  Laterality: N/A;  200 - pt to prep in Endo  . CORONARY ANGIOPLASTY WITH STENT PLACEMENT  03/2006; 10/2007;   . HERNIA REPAIR    . LAPAROSCOPIC INCISIONAL / UMBILICAL / VENTRAL HERNIA REPAIR  07/2001   UHR  . POLYPECTOMY  05/28/2017   Procedure: POLYPECTOMY;  Surgeon: Rogene Houston, MD;  Location: [facility];  Service: Endoscopy;;  colon    SOCIAL HISTORY: Social History   Tobacco Use  . Smoking status: Never Smoker  . Smokeless tobacco: Never Used  Substance Use Topics  .  Alcohol use: Yes    Alcohol/week: 0.0 - 1.0 standard drinks    Comment: 03/23/2017 "might have a drink 4-6 times/year"  . Drug use: No    FAMILY HISTORY: Family History  Problem Relation Age of Onset  . Heart attack Father        7 heart attacks  . CAD Father   . Alcoholism Father   . Hyperlipidemia Mother   . Hypertension Mother   . Anemia Unknown   . Diabetes Unknown   . CAD Sister   . Diabetes Sister     ROS: Review of Systems  Constitutional: Positive for malaise/fatigue.  Eyes:       Wear Glasses or Contacts  Respiratory: Positive for shortness of breath (with activity).   Cardiovascular: Negative for orthopnea.  Gastrointestinal: Negative for nausea and vomiting.  Musculoskeletal:       Negative for muscle weakness  Neurological: Negative for headaches.  Endo/Heme/Allergies:       Negative for hypoglycemia    PHYSICAL EXAM: Blood pressure 128/78, pulse 72, temperature 97.7 F (36.5 C), temperature source Oral, height 5'  6" (1.676 m), weight 199 lb (90.3 kg), SpO2 98 %. Body mass index is 32.12 kg/m. Physical Exam  Constitutional: He is oriented to person, place, and time. He appears well-developed and well-nourished.  HENT:  Head: Normocephalic and atraumatic.  Nose: Nose normal.  Eyes: EOM are normal.  Neck: Normal range of motion. Neck supple. No thyromegaly present.  Cardiovascular: Normal rate and regular rhythm.  Pulmonary/Chest: Effort normal. No respiratory distress.  Abdominal: Soft. There is no tenderness.  + Obesity  Musculoskeletal: Normal range of motion.  Range of Motion normal in all 4 extremities  Neurological: He is alert and oriented to person, place, and time.  Skin: Skin is warm and dry.  Psychiatric: He has a normal mood and affect. His behavior is normal.  Vitals reviewed.   RECENT LABS AND TESTS: BMET    Component Value Date/Time   NA 138 02/22/2018 1025   K 4.0 02/22/2018 1025   CL 102 02/22/2018 1025   CO2 25 02/22/2018 1025   GLUCOSE 149 (H) 02/22/2018 1025   BUN 15 02/22/2018 1025   CREATININE 0.82 02/22/2018 1025   CALCIUM 9.5 02/22/2018 1025   GFRNONAA 102 02/22/2018 1025   GFRAA 118 02/22/2018 1025   Lab Results  Component Value Date   HGBA1C 10.4 (H) 03/14/2018   No results found for: INSULIN CBC    Component Value Date/Time   WBC 7.3 02/22/2018 1025   RBC 5.39 02/22/2018 1025   HGB 15.4 02/22/2018 1025   HCT 44.7 02/22/2018 1025   PLT 286 02/22/2018 1025   MCV 82.9 02/22/2018 1025   MCH 28.6 02/22/2018 1025   MCHC 34.5 02/22/2018 1025   RDW 12.8 02/22/2018 1025   LYMPHSABS 2,949 02/22/2018 1025   MONOABS 0.3 03/23/2017 1524   EOSABS 241 02/22/2018 1025   BASOSABS 51 02/22/2018 1025   Iron/TIBC/Ferritin/ %Sat No results found for: IRON, TIBC, FERRITIN, IRONPCTSAT Lipid Panel     Component Value Date/Time   CHOL 135 02/22/2018 1025   TRIG 278 (H) 02/22/2018 1025   HDL 32 (L) 02/22/2018 1025   CHOLHDL 4.2 02/22/2018 1025   VLDL 59 (H)  10/28/2015 0957   LDLCALC 66 02/22/2018 1025   Hepatic Function Panel     Component Value Date/Time   PROT 6.4 02/22/2018 1025   ALBUMIN 4.0 03/23/2017 1524   AST 17 02/22/2018 1025   ALT  31 02/22/2018 1025   ALKPHOS 72 03/23/2017 1524   BILITOT 0.3 02/22/2018 1025     ECG  shows NSR with a rate of 78 BPM INDIRECT CALORIMETER done today shows a VO2 of 252 and a REE of 1755.  His calculated basal metabolic rate is 4008 thus his basal metabolic rate is worse than expected.    ASSESSMENT AND PLAN: Other fatigue - Plan: EKG 12-Lead, T3, T4, free, TSH, Folate, Vitamin B12  Shortness of breath on exertion  Type 2 diabetes mellitus without complication, with long-term current use of insulin (HCC) - Plan: Microalbumin / creatinine urine ratio, C-peptide  Essential hypertension  Vitamin D deficiency - Plan: VITAMIN D 25 Hydroxy (Vit-D Deficiency, Fractures)  Depression screening  At risk for osteoporosis  Class 1 obesity with serious comorbidity and body mass index (BMI) of 32.0 to 32.9 in adult, unspecified obesity type  PLAN: Fatigue Alfonso was informed that his fatigue may be related to obesity, depression or many other causes. Labs will be ordered, and in the meanwhile [name] has agreed to work on diet, exercise and weight loss to help with fatigue. Proper sleep hygiene was discussed including the need for 7-8 hours of quality sleep each night. A sleep study was not ordered based on symptoms and Epworth score.  Dyspnea on exertion [name]'s shortness of breath appears to be obesity related and exercise induced. He has agreed to work on weight loss and gradually increase exercise to treat his exercise induced shortness of breath. If [name] follows our instructions and loses weight without improvement of his shortness of breath, we will plan to refer to pulmonology. We will monitor this condition regularly. [name] agrees to this plan.  Diabetes II on insulin [name] has been given  extensive diabetes education by myself today including ideal fasting and post-prandial blood glucose readings, individual ideal Hgb A1c goals and hypoglycemia prevention. We discussed the importance of good blood sugar control to decrease the likelihood of diabetic complications such as nephropathy, neuropathy, limb loss, blindness, coronary artery disease, and death. We discussed the importance of intensive lifestyle modification including diet, exercise and weight loss as the first line treatment for diabetes. We will check labs today. [name] agrees to continue his Lantus at 60 units and only give Humalog if his glucose is > 250 (11 units). He will start his lower simple carbohydrate diet today and follow up with our clinic in 2 weeks.   Vitamin D Deficiency [name] was informed that low vitamin D levels contributes to fatigue and are associated with obesity, breast, and colon cancer. We will check labs today and will follow up for routine testing of vitamin D, at least 2-3 times per year. Irma agrees to follow up with our clinic in 2 weeks.  At risk for osteopenia and osteoporosis Clarion was given extended  (15 minutes) osteoporosis prevention counseling today. Aedin is at risk for osteopenia and osteoporosis due to his vitamin D deficiency. He was encouraged to take his vitamin D and follow his higher calcium diet and increase strengthening exercise to help strengthen his bones and decrease his risk of osteopenia and osteoporosis.  Hypertension We discussed sodium restriction, working on healthy weight loss, and a regular exercise program as the means to achieve improved blood pressure control. Algie agreed with this plan and agreed to follow up as directed. We will continue to monitor his blood pressure as well as his progress with the above lifestyle modifications. He will continue his medications as prescribed and will  watch for signs of hypotension as he continues his lifestyle  modifications.  Depression Screen Zahi had a mildly positive depression screening. Depression is commonly associated with obesity and often results in emotional eating behaviors. We will monitor this closely and work on CBT to help improve the non-hunger eating patterns. Referral to Psychology may be required if no improvement is seen as he continues in our clinic.  Obesity Reef is currently in the action stage of change and his goal is to continue with weight loss efforts. I recommend Virl begin the structured treatment plan as follows:  He has agreed to follow the Category 3 plan Jorian has been instructed to eventually work up to a goal of 150 minutes of combined cardio and strengthening exercise per week for weight loss and overall health benefits. We discussed the following Behavioral Modification Strategies today: keeping healthy foods in the home, increasing lean protein intake and decreasing simple carbohydrates    He was informed of the importance of frequent follow up visits to maximize his success with intensive lifestyle modifications for his multiple health conditions. He was informed we would discuss his lab results at his next visit unless there is a critical issue that needs to be addressed sooner. Tarvaris agreed to keep his next visit at the agreed upon time to discuss these results.    OBESITY BEHAVIORAL INTERVENTION VISIT  Today's visit was # 1   Starting weight: 199 lbs Starting date: 03/14/18 Today's weight : 199 lbs Today's date: 03/14/2018 Total lbs lost to date: 0   ASK: We discussed the diagnosis of obesity with [name] today and Zacharius agreed to give [name] permission to discuss obesity behavioral modification therapy today.  ASSESS: Jarrette has the diagnosis of obesity and his BMI today is 32.13 Romar is in the action stage of change   ADVISE: Kaiyon was educated on the multiple health risks of obesity as well as the benefit of weight loss to improve his  health. He was advised of the need for long term treatment and the importance of lifestyle modifications to improve his current health and to decrease his risk of future health problems.  AGREE: Multiple dietary modification options and treatment options were discussed and  Zyere agreed to follow the recommendations documented in the above note.  ARRANGE: Luke was educated on the importance of frequent visits to treat obesity as outlined per CMS and USPSTF guidelines and agreed to schedule his next follow up appointment today.  I, Doreene Nest, am acting as transcriptionist for Dennard Nip, MD   I have reviewed the above documentation for accuracy and completeness, and I agree with the above. -Dennard Nip, MD

## 2018-03-28 ENCOUNTER — Ambulatory Visit (INDEPENDENT_AMBULATORY_CARE_PROVIDER_SITE_OTHER): Payer: 59 | Admitting: Family Medicine

## 2018-03-28 VITALS — BP 117/73 | HR 89 | Temp 97.8°F | Ht 66.0 in | Wt 195.0 lb

## 2018-03-28 DIAGNOSIS — E669 Obesity, unspecified: Secondary | ICD-10-CM

## 2018-03-28 DIAGNOSIS — E559 Vitamin D deficiency, unspecified: Secondary | ICD-10-CM | POA: Diagnosis not present

## 2018-03-28 DIAGNOSIS — E1129 Type 2 diabetes mellitus with other diabetic kidney complication: Secondary | ICD-10-CM

## 2018-03-28 DIAGNOSIS — Z6831 Body mass index (BMI) 31.0-31.9, adult: Secondary | ICD-10-CM

## 2018-03-28 DIAGNOSIS — R809 Proteinuria, unspecified: Secondary | ICD-10-CM

## 2018-03-28 DIAGNOSIS — Z9189 Other specified personal risk factors, not elsewhere classified: Secondary | ICD-10-CM | POA: Diagnosis not present

## 2018-03-28 DIAGNOSIS — Z794 Long term (current) use of insulin: Secondary | ICD-10-CM | POA: Diagnosis not present

## 2018-03-28 MED ORDER — VITAMIN D (ERGOCALCIFEROL) 1.25 MG (50000 UNIT) PO CAPS: 50000.0000 [IU] | ORAL_CAPSULE | ORAL | 0 refills | Status: DC

## 2018-03-28 MED FILL — SHIPPING COST: 1 days supply | Qty: 1 | Fill #13

## 2018-03-28 MED FILL — VIT D2 1.25 MG (50,000 UNIT: 1.25 MG | 28 days supply | Qty: 4 | Fill #0

## 2018-03-29 ENCOUNTER — Encounter (INDEPENDENT_AMBULATORY_CARE_PROVIDER_SITE_OTHER): Payer: Self-pay | Admitting: Family Medicine

## 2018-03-29 NOTE — Progress Notes (Signed)
Office: [phone]  /  Fax: [phone]   HPI:   Chief Complaint: OBESITY [name] is here to discuss his progress with his obesity treatment plan. He is on the Category 3 plan and is following his eating plan approximately 80 to 85 % of the time. He states he is walking around the building 10 to 15 minutes 3 times per week. [name] did well with weight loss on his low simple carb diet at 1500 kcal/day. He decreased his eating out and tried to make better choices. His hunger was controlled, but he didn't like his fruit options.  His weight is 195 lb (88.5 kg) today and has had a weight loss of 4 pounds over a period of 2 weeks since his last visit. He has lost 4 lbs since starting treatment with [name].  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 [name] has a sister with type I diabetes mellitis and he developed symptoms in his 52's with rapid weight loss, polyuria, and polydipsia. 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 He did not bring his blood sugar log today and he states that his fasting BG's range between 80 and 160. His last A1c was 10.4 on 03/14/18. He is on Lantus 45u BID and Humalog 11u TID if glucose is above 150.  Vitamin D deficiency [name] has a diagnosis of vitamin D deficiency. He is not currently taking vit D and his level is very low. He admits fatigue.  At risk for cardiovascular disease [name] is at a higher than average risk for cardiovascular disease due to diabetes and obesity.   ALLERGIES: Allergies  Allergen Reactions  . Pepcid [Famotidine] Other (See Comments)    Contraindicated with ODEFSEY  . Prilosec [Omeprazole] Other (See Comments)    Contraindicated with RPV (lowers level of this ARV in ODEFSEY    MEDICATIONS: Current Outpatient Medications on File Prior to Visit  Medication Sig Dispense Refill  . aspirin 81 MG tablet Take 1 tablet (81 mg total) by  mouth every evening. 30 tablet   . chlorthalidone (HYGROTON) 25 MG tablet Take 1 tablet (25 mg total) by mouth daily. 90 tablet 2  . emtricitabine-rilpivir-tenofovir AF (ODEFSEY) 200-25-25 MG TABS tablet Take 1 tablet by mouth daily with breakfast. 30 tablet 11  . insulin glargine (LANTUS SOLOSTAR) 100 UNIT/ML injection Inject 60 Units into the skin at bedtime.    . insulin lispro (HUMALOG) 100 UNIT/ML injection Inject 10-16 Units into the skin 3 (three) times daily before meals.    [name] lisinopril (PRINIVIL,ZESTRIL) 10 MG tablet Take 1 tablet (10 mg total) by mouth daily. (Patient taking differently: Take 10 mg by mouth every evening. ) 90 tablet 2  . rosuvastatin (CRESTOR) 40 MG tablet Take 1 tablet (40 mg total) by mouth daily. (Patient taking differently: Take 40 mg by mouth every evening. ) 90 tablet 2   No current facility-administered medications on file prior to visit.     PAST MEDICAL HISTORY: Past Medical History:  Diagnosis Date  . CAD (coronary artery disease)    DES.  /  ... Later (10/2007) intervention for in-stent restenosis ;  EF 60%... catheterization... June, 2009  . Chest pain   . Diabetes mellitus type 2, uncontrolled ([place]) 09/27/2014  . Diabetes mellitus type II   . DKA (diabetic ketoacidoses) ([place]) 12/2010; 03/23/2017  . Dyslipidemia   . Ejection fraction  EF 60%, catheterization, 2009  . Gallbladder problem   . History of heart attack   . History of kidney stones   . HIV (human immunodeficiency virus infection) ([place]) dx'd ~ 2002/2003  . HLD (hyperlipidemia)   . HTN (hypertension)   . 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  . Nausea and vomiting 03/23/2017  . 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 RPR  reverted to negative  . Sinus tachycardia    mild at rest.... October, 2010  . Syphilitic hepatitis     PAST SURGICAL HISTORY: Past Surgical History:    Procedure Laterality Date  . CHOLECYSTECTOMY OPEN  07/2001  . COLONOSCOPY N/A 05/28/2017   Procedure: COLONOSCOPY;  Surgeon: Rogene Houston, MD;  [facility];  Service: Endoscopy;  Laterality: N/A;  200 - pt to prep in Endo  . CORONARY ANGIOPLASTY WITH STENT PLACEMENT  03/2006; 10/2007;   . HERNIA REPAIR    . LAPAROSCOPIC INCISIONAL / UMBILICAL / VENTRAL HERNIA REPAIR  07/2001   UHR  . POLYPECTOMY  05/28/2017   Procedure: POLYPECTOMY;  Surgeon: Rogene Houston, MD;  [facility]: [facility];  Service: Endoscopy;;  colon    SOCIAL HISTORY: Social History   Tobacco Use  . Smoking status: Never Smoker  . Smokeless tobacco: Never Used  Substance Use Topics  . Alcohol use: Yes    Alcohol/week: 0.0 - 1.0 standard drinks    Comment: 03/23/2017 "might have a drink 4-6 times/year"  . Drug use: No    FAMILY HISTORY: Family History  Problem Relation Age of Onset  . Heart attack Father        7 heart attacks  . CAD Father   . Alcoholism Father   . Hyperlipidemia Mother   . Hypertension Mother   . Anemia Unknown   . Diabetes Unknown   . CAD Sister   . Diabetes Sister     ROS: Review of Systems  Constitutional: Positive for malaise/fatigue and weight loss.  Genitourinary:       Positive for microalbuminuria.    PHYSICAL EXAM: Blood pressure 117/73, pulse 89, temperature 97.8 F (36.6 C), temperature source Oral, height 5\' 6"  (1.676 m), weight 195 lb (88.5 kg), SpO2 95 %. Body mass index is 31.47 kg/m. Physical Exam  Constitutional: He is oriented to person, place, and time. He appears well-developed and well-nourished.  Cardiovascular: Normal rate.  Pulmonary/Chest: Effort normal.  Musculoskeletal: Normal range of motion.  Neurological: He is oriented to person, place, and time.  Skin: Skin is warm and dry.  Psychiatric: He has a normal mood and affect. His behavior is normal.  Vitals reviewed.   RECENT LABS AND TESTS: BMET    Component Value Date/Time    NA 138 02/22/2018 1025   K 4.0 02/22/2018 1025   CL 102 02/22/2018 1025   CO2 25 02/22/2018 1025   GLUCOSE 149 (H) 02/22/2018 1025   BUN 15 02/22/2018 1025   CREATININE 0.82 02/22/2018 1025   CALCIUM 9.5 02/22/2018 1025   GFRNONAA 102 02/22/2018 1025   GFRAA 118 02/22/2018 1025   Lab Results  Component Value Date   HGBA1C 10.4 (H) 03/14/2018   HGBA1C 9.8 (A) 12/13/2017   HGBA1C 10.6 (H) 03/23/2017   HGBA1C 8.5 03/30/2009   No results found for: INSULIN CBC    Component Value Date/Time   WBC 7.3 02/22/2018 1025   RBC 5.39 02/22/2018 1025   HGB  15.4 02/22/2018 1025   HCT 44.7 02/22/2018 1025   PLT 286 02/22/2018 1025   MCV 82.9 02/22/2018 1025   MCH 28.6 02/22/2018 1025   MCHC 34.5 02/22/2018 1025   RDW 12.8 02/22/2018 1025   LYMPHSABS 2,949 02/22/2018 1025   MONOABS 0.3 03/23/2017 1524   EOSABS 241 02/22/2018 1025   BASOSABS 51 02/22/2018 1025   Iron/TIBC/Ferritin/ %Sat No results found for: IRON, TIBC, FERRITIN, IRONPCTSAT Lipid Panel     Component Value Date/Time   CHOL 135 02/22/2018 1025   TRIG 278 (H) 02/22/2018 1025   HDL 32 (L) 02/22/2018 1025   CHOLHDL 4.2 02/22/2018 1025   VLDL 59 (H) 10/28/2015 0957   LDLCALC 66 02/22/2018 1025   Hepatic Function Panel     Component Value Date/Time   PROT 6.4 02/22/2018 1025   ALBUMIN 4.0 03/23/2017 1524   AST 17 02/22/2018 1025   ALT 31 02/22/2018 1025   ALKPHOS 72 03/23/2017 1524   BILITOT 0.3 02/22/2018 1025    Results for JEVAN, GAUNT (MRN [id number]) as of 03/29/2018 09:00  Ref. 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 We discussed the importance of good blood sugar control to decrease the likelihood of diabetic complications such as nephropathy, neuropathy, limb loss, blindness, coronary artery disease, and death. We discussed the importance of intensive lifestyle modification including diet, exercise and weight loss as the first line treatment for diabetes. Dasean agrees to continue with his diet, exercise, weight loss, and to bring his blood sugar log into the office at his next visit. He agrees to follow up in 2 weeks.  Vitamin D Deficiency Hieu was informed that low vitamin D levels contributes to fatigue and are associated with obesity, breast, and colon cancer. He agrees to start to take prescription Vit D @50 ,000 IU every week #4 with no refills and will follow up for routine testing of vitamin D, at least 2-3 times per year. He was informed of the risk of over-replacement of vitamin D and agrees to not increase his dose unless he discusses this with [name] first. Artez agrees to follow up as directed.  Cardiovascular risk counseling [place] was given extended (30 minutes) coronary artery disease prevention counseling today. He is 52 y.o. male and has risk factors for heart disease including diabetes and obesity. We discussed intensive lifestyle modifications today with an emphasis on specific weight loss instructions and strategies. Pt was also informed of the importance of increasing exercise and decreasing saturated fats to help prevent heart disease.  Obesity Raynaldo is currently in the action stage of change. As such, his goal is to continue with weight loss efforts. He has agreed to follow the Category 3 plan. Khang has been instructed to  work up to a goal of 150 minutes of combined cardio and strengthening exercise per week for weight loss and overall health benefits. We discussed the following Behavioral  Modification Strategies today: increasing lean protein intake, decreasing simple carbohydrates, and work on meal planning and easy cooking plans.  Elgin has agreed to follow up with our clinic in 2 weeks. He was informed of the importance of frequent follow up visits to maximize his success with intensive lifestyle modifications for his multiple health conditions.   OBESITY BEHAVIORAL INTERVENTION VISIT  Today's visit was # 2   Starting weight: 199 lbs Starting date: 03/14/18 Today's weight : Weight: 195 lb (88.5 kg)  Today's date: 03/28/2018 Total lbs lost to date: 4  ASK: We discussed the diagnosis of obesity with [name] today and Delson agreed to give [name] permission to discuss obesity behavioral modification therapy today.  ASSESS: Usbaldo has the diagnosis of obesity and his BMI today is 31.49. Declin is in the action stage of change.   ADVISE: Terelle was educated on the multiple health risks of obesity as well as the benefit of weight loss to improve his health. He was advised of the need for long term treatment and the importance of lifestyle modifications to improve his current health and to decrease his risk of future health problems.  AGREE: Multiple dietary modification options and treatment options were discussed and Dayvion agreed to follow the recommendations documented in the above note.  ARRANGE: Edmundo was educated on the importance of frequent visits to treat obesity as outlined per CMS and USPSTF guidelines and agreed to schedule his next follow up appointment today.  I, Marcille Blanco, am acting as transcriptionist for Starlyn Skeans, MD  I have reviewed the above documentation for accuracy and completeness, and I agree with the above. -Dennard Nip, MD

## 2018-04-01 MED FILL — ODEFSEY 200-25-25 MG TABS: 200-25-25 | 30 days supply | Qty: 30 | Fill #1

## 2018-04-01 MED FILL — SHIPPING COST: 1 days supply | Qty: 1 | Fill #14

## 2018-04-11 ENCOUNTER — Ambulatory Visit (INDEPENDENT_AMBULATORY_CARE_PROVIDER_SITE_OTHER): Payer: 59 | Admitting: Family Medicine

## 2018-04-11 VITALS — BP 147/79 | HR 74 | Temp 98.4°F | Ht 66.0 in | Wt 193.0 lb

## 2018-04-11 DIAGNOSIS — E669 Obesity, unspecified: Secondary | ICD-10-CM

## 2018-04-11 DIAGNOSIS — Z6831 Body mass index (BMI) 31.0-31.9, adult: Secondary | ICD-10-CM | POA: Diagnosis not present

## 2018-04-11 DIAGNOSIS — E559 Vitamin D deficiency, unspecified: Secondary | ICD-10-CM | POA: Diagnosis not present

## 2018-04-11 DIAGNOSIS — Z794 Long term (current) use of insulin: Secondary | ICD-10-CM

## 2018-04-11 DIAGNOSIS — Z9189 Other specified personal risk factors, not elsewhere classified: Secondary | ICD-10-CM | POA: Diagnosis not present

## 2018-04-11 DIAGNOSIS — E1129 Type 2 diabetes mellitus with other diabetic kidney complication: Secondary | ICD-10-CM | POA: Diagnosis not present

## 2018-04-11 DIAGNOSIS — R809 Proteinuria, unspecified: Secondary | ICD-10-CM

## 2018-04-12 ENCOUNTER — Ambulatory Visit: Payer: 59 | Admitting: "Endocrinology

## 2018-04-12 ENCOUNTER — Encounter: Payer: Self-pay | Admitting: "Endocrinology

## 2018-04-13 MED ORDER — VITAMIN D (ERGOCALCIFEROL) 1.25 MG (50000 UNIT) PO CAPS: 50000.0000 [IU] | ORAL_CAPSULE | ORAL | 0 refills | Status: DC

## 2018-04-13 NOTE — Progress Notes (Signed)
Office: [phone]  /  Fax: [phone]   HPI:   Chief Complaint: OBESITY [name] is here to discuss his progress with his obesity treatment plan. [name] is on the Category 3 plan and is following his eating plan approximately 50 % of the time. [name] states [name] is walking the dogs for 10 minutes 3-4 times per week. [name] did well with weight loss even while caring for his mother and cooking her favorite foods. [name] has questions about how to handle the holidays.  His weight is 193 lb (87.5 kg) today and has had a weight loss of 2 pounds over a period of 2 weeks since his last visit. [name] has lost 6 lbs since starting treatment with [name].  Vitamin D Deficiency [name] has a diagnosis of vitamin D deficiency. [name] is stable on prescription Vit D and denies nausea, vomiting or muscle weakness.  At risk for osteopenia and osteoporosis [name] is at higher risk of osteopenia and osteoporosis due to vitamin D deficiency.   Diabetes II [name] has a diagnosis of diabetes type II. [name] is on Lantus 60 units and has his Humalog mostly after lunch and supper. His morning glucose is now approximately 150. Last A1c was very elevated at 10.4. [name] has been working on intensive lifestyle modifications including diet, exercise, and weight loss to help control his blood glucose levels.  ALLERGIES: Allergies  Allergen Reactions  . Pepcid [Famotidine] Other (See Comments)    Contraindicated with ODEFSEY  . Prilosec [Omeprazole] Other (See Comments)    Contraindicated with RPV (lowers level of this ARV in ODEFSEY    MEDICATIONS: Current Outpatient Medications on File Prior to Visit  Medication Sig Dispense Refill  . aspirin 81 MG tablet Take 1 tablet (81 mg total) by mouth every evening. 30 tablet   . chlorthalidone (HYGROTON) 25 MG tablet Take 1 tablet (25 mg total) by mouth daily. 90 tablet 2  . emtricitabine-rilpivir-tenofovir AF (ODEFSEY) 200-25-25 MG TABS tablet Take 1 tablet by mouth daily with breakfast. 30  tablet 11  . insulin glargine (LANTUS SOLOSTAR) 100 UNIT/ML injection Inject 60 Units into the skin at bedtime.    . insulin lispro (HUMALOG) 100 UNIT/ML injection Inject 10-16 Units into the skin 3 (three) times daily before meals.    [name] lisinopril (PRINIVIL,ZESTRIL) 10 MG tablet Take 1 tablet (10 mg total) by mouth daily. (Patient taking differently: Take 10 mg by mouth every evening. ) 90 tablet 2  . rosuvastatin (CRESTOR) 40 MG tablet Take 1 tablet (40 mg total) by mouth daily. (Patient taking differently: Take 40 mg by mouth every evening. ) 90 tablet 2  . Vitamin D, Ergocalciferol, (DRISDOL) 50000 units CAPS capsule Take 1 capsule (50,000 Units total) by mouth every 7 (seven) days. 4 capsule 0   No current facility-administered medications on file prior to visit.     PAST MEDICAL HISTORY: Past Medical History:  Diagnosis Date  . CAD (coronary artery disease)    DES.  /  ... Later (10/2007) intervention for in-stent restenosis ;  EF 60%... catheterization... June, 2009  . Chest pain   . Diabetes mellitus type 2, uncontrolled ([name]) 09/27/2014  . Diabetes mellitus type II   . DKA (diabetic ketoacidoses) ([place]) 12/2010; 03/23/2017  . Dyslipidemia   . Ejection fraction    EF 60%, catheterization, 2009  . Gallbladder problem   . History of heart attack   . History of kidney stones   . HIV (human immunodeficiency virus infection) ([place]) dx'd ~ 2002/2003  .  HLD (hyperlipidemia)   . HTN (hypertension)   . 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  . Nausea and vomiting 03/23/2017  . 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 RPR  reverted to negative  . Sinus tachycardia    mild at rest.... October, 2010  . Syphilitic hepatitis     PAST SURGICAL HISTORY: Past Surgical History:  Procedure Laterality Date  . CHOLECYSTECTOMY OPEN  07/2001  . COLONOSCOPY N/A 05/28/2017   Procedure:  COLONOSCOPY;  Surgeon: Rogene Houston, MD;  [facility];  Service: Endoscopy;  Laterality: N/A;  200 - pt to prep in Endo  . CORONARY ANGIOPLASTY WITH STENT PLACEMENT  03/2006; 10/2007;   . HERNIA REPAIR    . LAPAROSCOPIC INCISIONAL / UMBILICAL / VENTRAL HERNIA REPAIR  07/2001   UHR  . POLYPECTOMY  05/28/2017   Procedure: POLYPECTOMY;  Surgeon: Rogene Houston, MD;  [facility];  Service: Endoscopy;;  colon    SOCIAL HISTORY: Social History   Tobacco Use  . Smoking status: Never Smoker  . Smokeless tobacco: Never Used  Substance Use Topics  . Alcohol use: Yes    Alcohol/week: 0.0 - 1.0 standard drinks    Comment: 03/23/2017 "might have a drink 4-6 times/year"  . Drug use: No    FAMILY HISTORY: Family History  Problem Relation Age of Onset  . Heart attack Father        7 heart attacks  . CAD Father   . Alcoholism Father   . Hyperlipidemia Mother   . Hypertension Mother   . Anemia Unknown   . Diabetes Unknown   . CAD Sister   . Diabetes Sister     ROS: Review of Systems  Constitutional: Positive for weight loss.  Gastrointestinal: Negative for nausea and vomiting.  Musculoskeletal:       Negative muscle weakness  Endo/Heme/Allergies:       Negative hypoglycemia    PHYSICAL EXAM: Blood pressure (!) 147/79, pulse 74, temperature 98.4 F (36.9 C), temperature source Oral, height 5\' 6"  (1.676 m), weight 193 lb (87.5 kg), SpO2 97 %. Body mass index is 31.15 kg/m. Physical Exam  Constitutional: [name] is oriented to person, place, and time. [name] appears well-developed and well-nourished.  Cardiovascular: Normal rate.  Pulmonary/Chest: Effort normal.  Musculoskeletal: Normal range of motion.  Neurological: [name] is oriented to person, place, and time.  Skin: Skin is warm and dry.  Psychiatric: [name] has a normal mood and affect. His behavior is normal.  Vitals reviewed.   RECENT LABS AND TESTS: BMET    Component Value Date/Time   NA 138 02/22/2018  1025   K 4.0 02/22/2018 1025   CL 102 02/22/2018 1025   CO2 25 02/22/2018 1025   GLUCOSE 149 (H) 02/22/2018 1025   BUN 15 02/22/2018 1025   CREATININE 0.82 02/22/2018 1025   CALCIUM 9.5 02/22/2018 1025   GFRNONAA 102 02/22/2018 1025   GFRAA 118 02/22/2018 1025   Lab Results  Component Value Date   HGBA1C 10.4 (H) 03/14/2018   HGBA1C 9.8 (A) 12/13/2017   HGBA1C 10.6 (H) 03/23/2017   HGBA1C 8.5 03/30/2009   No results found for: INSULIN CBC    Component Value Date/Time   WBC 7.3 02/22/2018 1025   RBC 5.39 02/22/2018 1025   HGB 15.4 02/22/2018 1025   HCT 44.7 02/22/2018 1025   PLT 286 02/22/2018 1025   MCV 82.9 02/22/2018 1025  MCH 28.6 02/22/2018 1025   MCHC 34.5 02/22/2018 1025   RDW 12.8 02/22/2018 1025   LYMPHSABS 2,949 02/22/2018 1025   MONOABS 0.3 03/23/2017 1524   EOSABS 241 02/22/2018 1025   BASOSABS 51 02/22/2018 1025   Iron/TIBC/Ferritin/ %Sat No results found for: IRON, TIBC, FERRITIN, IRONPCTSAT Lipid Panel     Component Value Date/Time   CHOL 135 02/22/2018 1025   TRIG 278 (H) 02/22/2018 1025   HDL 32 (L) 02/22/2018 1025   CHOLHDL 4.2 02/22/2018 1025   VLDL 59 (H) 10/28/2015 0957   LDLCALC 66 02/22/2018 1025   Hepatic Function Panel     Component Value Date/Time   PROT 6.4 02/22/2018 1025   ALBUMIN 4.0 03/23/2017 1524   AST 17 02/22/2018 1025   ALT 31 02/22/2018 1025   ALKPHOS 72 03/23/2017 1524   BILITOT 0.3 02/22/2018 1025    Results for CONNY, MOENING (MRN [id number]) as of 04/13/2018 08:13  Ref. Range 03/14/2018 09:50  Vitamin D, 25-Hydroxy Latest Ref Range: 30.0 - 100.0 ng/mL 9.4 (L)   ASSESSMENT AND PLAN: No diagnosis found.  PLAN:  Vitamin D Deficiency [name] was informed that low vitamin D levels contributes to fatigue and are associated with obesity, breast, and colon cancer. [name] agrees to continue taking prescription Vit D @50 ,000 IU every week #4 and we will refill for 1 month. [name] will follow up for routine testing of vitamin  D, at least 2-3 times per year. [name] was informed of the risk of over-replacement of vitamin D and agrees to not increase his dose unless [name] discusses this with [name] first. [name] agrees to follow up with our clinic in 3 weeks.  At risk for osteopenia and osteoporosis [name] was given extended (15 minutes) osteoporosis prevention counseling today. [name] is at risk for osteopenia and osteoporsis due to his vitamin D deficiency. [name] was encouraged to take his vitamin D and follow his higher calcium diet and increase strengthening exercise to help strengthen his bones and decrease his risk of osteopenia and osteoporosis.  Diabetes II [name] has been given extensive diabetes education by myself today including ideal fasting and post-prandial blood glucose readings, individual ideal Hgb A1c goals and hypoglycemia prevention. We discussed the importance of good blood sugar control to decrease the likelihood of diabetic complications such as nephropathy, neuropathy, limb loss, blindness, coronary artery disease, and death. We discussed the importance of intensive lifestyle modification including diet, exercise and weight loss as the first line treatment for diabetes. [name] agrees to continue his diabetes medications, diet, and exercise, and we will recheck labs in 2 months. [name] agrees to follow up with our clinic in 3 weeks.  Obesity [name] is currently in the action stage of change. As such, his goal is to continue with weight loss efforts [name] has agreed to follow the Category 3 plan [name] has been instructed to work up to a goal of 150 minutes of combined cardio and strengthening exercise per week for weight loss and overall health benefits. We discussed the following Behavioral Modification Strategies today: increasing lean protein intake, decreasing simple carbohydrates , increasing vegetables, holiday eating strategies  and emotional eating strategies   [name] has agreed to follow up with our clinic in 3  weeks. [name] was informed of the importance of frequent follow up visits to maximize his success with intensive lifestyle modifications for his multiple health conditions.   OBESITY BEHAVIORAL INTERVENTION VISIT  Today's visit was # 3   Starting weight: 199 lbs Starting date:  03/14/18 Today's weight : 193 lbs  Today's date: 04/11/2018 Total lbs lost to date: 6    ASK: We discussed the diagnosis of obesity with [name] today and [name] agreed to give [name] permission to discuss obesity behavioral modification therapy today.  ASSESS: [name] has the diagnosis of obesity and his BMI today is 31.17 [name] is in the action stage of change   ADVISE: [name] was educated on the multiple health risks of obesity as well as the benefit of weight loss to improve his health. [name] was advised of the need for long term treatment and the importance of lifestyle modifications to improve his current health and to decrease his risk of future health problems.  AGREE: Multiple dietary modification options and treatment options were discussed and  Taz agreed to follow the recommendations documented in the above note.  ARRANGE: Ladislav was educated on the importance of frequent visits to treat obesity as outlined per CMS and USPSTF guidelines and agreed to schedule his next follow up appointment today.  I, Trixie Dredge, am acting as transcriptionist for Dennard Nip, MD  I have reviewed the above documentation for accuracy and completeness, and I agree with the above. -Dennard Nip, MD

## 2018-04-27 MED FILL — SHIPPING COST: 1 days supply | Qty: 1 | Fill #15

## 2018-04-27 MED FILL — ODEFSEY 200-25-25 MG TABS: 200-25-25 | 30 days supply | Qty: 30 | Fill #2

## 2018-04-27 MED FILL — VIT D2 1.25 MG (50,000 UNIT: 1.25 MG | 28 days supply | Qty: 4 | Fill #0

## 2018-05-02 ENCOUNTER — Encounter (INDEPENDENT_AMBULATORY_CARE_PROVIDER_SITE_OTHER): Payer: Self-pay | Admitting: Family Medicine

## 2018-05-02 ENCOUNTER — Ambulatory Visit (INDEPENDENT_AMBULATORY_CARE_PROVIDER_SITE_OTHER): Payer: 59 | Admitting: Family Medicine

## 2018-05-02 VITALS — BP 138/84 | HR 96 | Ht 66.0 in | Wt 195.0 lb

## 2018-05-02 DIAGNOSIS — E669 Obesity, unspecified: Secondary | ICD-10-CM | POA: Diagnosis not present

## 2018-05-02 DIAGNOSIS — E1165 Type 2 diabetes mellitus with hyperglycemia: Secondary | ICD-10-CM

## 2018-05-02 DIAGNOSIS — Z6831 Body mass index (BMI) 31.0-31.9, adult: Secondary | ICD-10-CM | POA: Diagnosis not present

## 2018-05-02 DIAGNOSIS — Z794 Long term (current) use of insulin: Secondary | ICD-10-CM | POA: Diagnosis not present

## 2018-05-03 NOTE — Progress Notes (Signed)
Office: [phone]  /  Fax: [phone]   HPI:   Chief Complaint: OBESITY [name] is here to discuss his progress with his obesity treatment plan. [name] is on the Category 3 plan and is following his eating plan approximately 40 % of the time. [name] states [name] is walking 20 to 60 minutes 4 times per week. [name] notes and increase in temptations and celebration eating. [name] is retaining fluid today. [name] thinks Christmas won't be too much of a problem due to working over Christmas.  His weight is 195 lb (88.5 kg) today and has had a weight gain of 2 pounds over a period of 3 weeks since his last visit. [name] has lost 4 lbs since starting treatment with [name].  Diabetes II on Insulin with Hyperglycemia [name] has a diagnosis of diabetes type II. [name] notes to having some low blood sugar numbers, such as 56 and 72 and some hypoglycemia as well in the last 2 weeks. Last A1c was 10.4 on 05/14/18. [name] has been working on intensive lifestyle modifications including diet, exercise, and weight loss to help control his blood glucose levels. [name] admits to hyperglycemia and hypoglycemia.  ALLERGIES: Allergies  Allergen Reactions  . Pepcid [Famotidine] Other (See Comments)    Contraindicated with ODEFSEY  . Prilosec [Omeprazole] Other (See Comments)    Contraindicated with RPV (lowers level of this ARV in ODEFSEY    MEDICATIONS: Current Outpatient Medications on File Prior to Visit  Medication Sig Dispense Refill  . aspirin 81 MG tablet Take 1 tablet (81 mg total) by mouth every evening. 30 tablet   . chlorthalidone (HYGROTON) 25 MG tablet Take 1 tablet (25 mg total) by mouth daily. 90 tablet 2  . emtricitabine-rilpivir-tenofovir AF (ODEFSEY) 200-25-25 MG TABS tablet Take 1 tablet by mouth daily with breakfast. 30 tablet 11  . insulin glargine (LANTUS SOLOSTAR) 100 UNIT/ML injection Inject 60 Units into the skin at bedtime.    . insulin lispro (HUMALOG) 100 UNIT/ML injection Inject 10-16 Units into the skin 3  (three) times daily before meals.    [name] lisinopril (PRINIVIL,ZESTRIL) 10 MG tablet Take 1 tablet (10 mg total) by mouth daily. (Patient taking differently: Take 10 mg by mouth every evening. ) 90 tablet 2  . rosuvastatin (CRESTOR) 40 MG tablet Take 1 tablet (40 mg total) by mouth daily. (Patient taking differently: Take 40 mg by mouth every evening. ) 90 tablet 2  . Vitamin D, Ergocalciferol, (DRISDOL) 1.25 MG (50000 UT) CAPS capsule Take 1 capsule (50,000 Units total) by mouth every 7 (seven) days. 4 capsule 0   No current facility-administered medications on file prior to visit.     PAST MEDICAL HISTORY: Past Medical History:  Diagnosis Date  . CAD (coronary artery disease)    DES.  /  ... Later (10/2007) intervention for in-stent restenosis ;  EF 60%... catheterization... June, 2009  . Chest pain   . Diabetes mellitus type 2, uncontrolled ([place]) 09/27/2014  . Diabetes mellitus type II   . DKA (diabetic ketoacidoses) ([place]) 12/2010; 03/23/2017  . Dyslipidemia   . Ejection fraction    EF 60%, catheterization, 2009  . Gallbladder problem   . History of heart attack   . History of kidney stones   . HIV (human immunodeficiency virus infection) ([place]) dx'd ~ 2002/2003  . HLD (hyperlipidemia)   . HTN (hypertension)   . 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  . Nausea and vomiting 03/23/2017  . 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 RPR  reverted to negative  . Sinus tachycardia    mild at rest.... October, 2010  . Syphilitic hepatitis     PAST SURGICAL HISTORY: Past Surgical History:  Procedure Laterality Date  . CHOLECYSTECTOMY OPEN  07/2001  . COLONOSCOPY N/A 05/28/2017   Procedure: COLONOSCOPY;  Surgeon: Rogene Houston, MD;  [facility];  Service: Endoscopy;  Laterality: N/A;  200 - pt to prep in Endo  . CORONARY ANGIOPLASTY WITH STENT PLACEMENT  03/2006; 10/2007;     . HERNIA REPAIR    . LAPAROSCOPIC INCISIONAL / UMBILICAL / VENTRAL HERNIA REPAIR  07/2001   UHR  . POLYPECTOMY  05/28/2017   Procedure: POLYPECTOMY;  Surgeon: Rogene Houston, MD;  [facility]: [facility];  Service: Endoscopy;;  colon    SOCIAL HISTORY: Social History   Tobacco Use  . Smoking status: Never Smoker  . Smokeless tobacco: Never Used  Substance Use Topics  . Alcohol use: Yes    Alcohol/week: 0.0 - 1.0 standard drinks    Comment: 03/23/2017 "might have a drink 4-6 times/year"  . Drug use: No    FAMILY HISTORY: Family History  Problem Relation Age of Onset  . Heart attack Father        7 heart attacks  . CAD Father   . Alcoholism Father   . Hyperlipidemia Mother   . Hypertension Mother   . Anemia Unknown   . Diabetes Unknown   . CAD Sister   . Diabetes Sister     ROS: Review of Systems  Constitutional: Negative for weight loss.  Endo/Heme/Allergies:       Positive for hyperglycemia. Positive for hypoglycemia.    PHYSICAL EXAM: Blood pressure 138/84, pulse 96, height 5\' 6"  (1.676 m), weight 195 lb (88.5 kg), SpO2 95 %. Body mass index is 31.47 kg/m. Physical Exam  Constitutional: [name] is oriented to person, place, and time. [name] appears well-developed and well-nourished.  Cardiovascular: Normal rate.  Pulmonary/Chest: Effort normal.  Musculoskeletal: Normal range of motion.  Neurological: [name] is oriented to person, place, and time.  Skin: Skin is warm and dry.  Psychiatric: [name] has a normal mood and affect. His behavior is normal.  Vitals reviewed.   RECENT LABS AND TESTS: BMET    Component Value Date/Time   NA 138 02/22/2018 1025   K 4.0 02/22/2018 1025   CL 102 02/22/2018 1025   CO2 25 02/22/2018 1025   GLUCOSE 149 (H) 02/22/2018 1025   BUN 15 02/22/2018 1025   CREATININE 0.82 02/22/2018 1025   CALCIUM 9.5 02/22/2018 1025   GFRNONAA 102 02/22/2018 1025   GFRAA 118 02/22/2018 1025   Lab Results  Component Value Date   HGBA1C 10.4 (H)  03/14/2018   HGBA1C 9.8 (A) 12/13/2017   HGBA1C 10.6 (H) 03/23/2017   HGBA1C 8.5 03/30/2009   No results found for: INSULIN CBC    Component Value Date/Time   WBC 7.3 02/22/2018 1025   RBC 5.39 02/22/2018 1025   HGB 15.4 02/22/2018 1025   HCT 44.7 02/22/2018 1025   PLT 286 02/22/2018 1025   MCV 82.9 02/22/2018 1025   MCH 28.6 02/22/2018 1025   MCHC 34.5 02/22/2018 1025   RDW 12.8 02/22/2018 1025   LYMPHSABS 2,949 02/22/2018 1025   MONOABS 0.3 03/23/2017 1524   EOSABS 241 02/22/2018 1025   BASOSABS 51 02/22/2018 1025   Iron/TIBC/Ferritin/ %Sat No results found for: IRON, TIBC, FERRITIN,  IRONPCTSAT Lipid Panel     Component Value Date/Time   CHOL 135 02/22/2018 1025   TRIG 278 (H) 02/22/2018 1025   HDL 32 (L) 02/22/2018 1025   CHOLHDL 4.2 02/22/2018 1025   VLDL 59 (H) 10/28/2015 0957   LDLCALC 66 02/22/2018 1025   Hepatic Function Panel     Component Value Date/Time   PROT 6.4 02/22/2018 1025   ALBUMIN 4.0 03/23/2017 1524   AST 17 02/22/2018 1025   ALT 31 02/22/2018 1025   ALKPHOS 72 03/23/2017 1524   BILITOT 0.3 02/22/2018 1025    Results for EQUAN, COGBILL (MRN [id number]) as of 05/03/2018 13:07  Ref. Range 03/14/2018 09:50  Vitamin D, 25-Hydroxy Latest Ref Range: 30.0 - 100.0 ng/mL 9.4 (L)   ASSESSMENT AND PLAN: Type 2 diabetes mellitus with hyperglycemia, with long-term current use of insulin (HCC)  Class 1 obesity with serious comorbidity and body mass index (BMI) of 31.0 to 31.9 in adult, unspecified obesity type  PLAN:  Diabetes II on Insulin with Hyperglycemia [name] has been given extensive diabetes education by myself today including ideal fasting and post-prandial blood glucose readings, individual ideal Hgb A1c goals, and hypoglycemia prevention. We discussed the importance of good blood sugar control to decrease the likelihood of diabetic complications such as nephropathy, neuropathy, limb loss, blindness, coronary artery disease, and death. We  discussed the importance of intensive lifestyle modification including diet, exercise and weight loss as the first line treatment for diabetes. [name] agrees to continue using Lantus @ 60 units per day, but add a bedtime snack to avoid hypoglycemia. [name] agrees to continue using Lantus @ 60 units per day, but add a bedtime snack to avoid hypoglycemia. [name] agrees that [name] will follow up at the agreed upon time in 2 weeks.  I spent > than 50% of the 15 minute visit on counseling as documented in the note.  Obesity [name] is currently in the action stage of change. As such, his goal is to continue with weight loss efforts. [name] has agreed to follow the Category 3 plan. [name] has been instructed to work up to a goal of 150 minutes of combined cardio and strengthening exercise per week for weight loss and overall health benefits. We discussed the following Behavioral Modification Strategies today: increasing lean protein intake, decreasing simple carbohydrates, work on meal planning and easy cooking plans, dealing with family or coworker sabotage, and holiday eating strategies.   [name] has agreed to follow up with our clinic in 2 weeks. [name] was informed of the importance of frequent follow up visits to maximize his success with intensive lifestyle modifications for his multiple health conditions.   OBESITY BEHAVIORAL INTERVENTION VISIT  Today's visit was # 4   Starting weight: 199 lbs Starting date: 03/14/18 Today's weight : Weight: 195 lb (88.5 kg)  Today's date: 05/02/2018 Total lbs lost to date: 4  ASK: We discussed the diagnosis of obesity with [name] today and [name] agreed to give [name] permission to discuss obesity behavioral modification therapy today.  ASSESS: [name] has the diagnosis of obesity and his BMI today is 31.4. [name] is in the action stage of change.   ADVISE: [name] was educated on the multiple health risks of obesity as well as the benefit of weight loss to improve his  health. [name] was advised of the need for long term treatment and the importance of lifestyle modifications to improve his current health and to decrease his risk of future health problems.  AGREE:  Multiple dietary modification options and treatment options were discussed and [name] agreed to follow the recommendations documented in the above note.  ARRANGE: [name] was educated on the importance of frequent visits to treat obesity as outlined per CMS and USPSTF guidelines and agreed to schedule his next follow up appointment today.  I, Marcille Blanco, am acting as transcriptionist for Starlyn Skeans, MD  I have reviewed the above documentation for accuracy and completeness, and I agree with the above. -Dennard Nip, MD

## 2018-05-16 ENCOUNTER — Ambulatory Visit (INDEPENDENT_AMBULATORY_CARE_PROVIDER_SITE_OTHER): Payer: 59 | Admitting: Family Medicine

## 2018-05-16 ENCOUNTER — Encounter (INDEPENDENT_AMBULATORY_CARE_PROVIDER_SITE_OTHER): Payer: Self-pay | Admitting: Family Medicine

## 2018-05-16 VITALS — BP 133/74 | HR 74 | Temp 97.8°F | Ht 66.0 in | Wt 194.0 lb

## 2018-05-16 DIAGNOSIS — E559 Vitamin D deficiency, unspecified: Secondary | ICD-10-CM | POA: Diagnosis not present

## 2018-05-16 DIAGNOSIS — Z6831 Body mass index (BMI) 31.0-31.9, adult: Secondary | ICD-10-CM | POA: Diagnosis not present

## 2018-05-16 DIAGNOSIS — Z9189 Other specified personal risk factors, not elsewhere classified: Secondary | ICD-10-CM

## 2018-05-16 DIAGNOSIS — E669 Obesity, unspecified: Secondary | ICD-10-CM

## 2018-05-16 MED ORDER — VITAMIN D (ERGOCALCIFEROL) 1.25 MG (50000 UNIT) PO CAPS: 50000.0000 [IU] | ORAL_CAPSULE | ORAL | 0 refills | Status: DC

## 2018-05-16 NOTE — Progress Notes (Signed)
Office: [phone]  /  Fax: [phone]   HPI:   Chief Complaint: OBESITY [name] is here to discuss his progress with his obesity treatment plan. He is on the Category 3 plan and is following his eating plan approximately 20-25 % of the time. He states he is exercising 0 minutes 0 times per week. Armel notes a lot more temptations at work with holidays. He has done well with portion control and smarter food choices, but he has given into more temptations recently.  His weight is 194 lb (88 kg) today and has had a weight loss of 1 pound over a period of 2 weeks since his last visit. He has lost 5 lbs since starting treatment with [name].  Vitamin D Deficiency [name] has a diagnosis of vitamin D deficiency. He is stable on prescription Vit D and denies nausea, vomiting or muscle weakness.  At risk for osteopenia and osteoporosis [name] is at higher risk of osteopenia and osteoporosis due to vitamin D deficiency.   ASSESSMENT AND PLAN:  Vitamin D deficiency - Plan: Vitamin D, Ergocalciferol, (DRISDOL) 1.25 MG (50000 UT) CAPS capsule  At risk for osteoporosis  Class 1 obesity with serious comorbidity and body mass index (BMI) of 31.0 to 31.9 in adult, unspecified obesity type  PLAN:  Vitamin D Deficiency [name] was informed that low vitamin D levels contributes to fatigue and are associated with obesity, breast, and colon cancer. [name] agrees to continue taking prescription Vit D @50 ,000 IU every week #4 and we will refill for 1 month. He will follow up for routine testing of vitamin D, at least 2-3 times per year. He was informed of the risk of over-replacement of vitamin D and agrees to not increase his dose unless he discusses this with [name] first. [name] agrees to follow up with our clinic in 2 to 3 weeks with Abby Potash, PA-C.  At risk for osteopenia and osteoporosis [name] was given extended (15 minutes) osteoporosis prevention counseling today. [name] is at risk for osteopenia and  osteoporsis due to his vitamin D deficiency. He was encouraged to take his vitamin D and follow his higher calcium diet and increase strengthening exercise to help strengthen his bones and decrease his risk of osteopenia and osteoporosis.  Obesity [name] is currently in the action stage of change. As such, his goal is to continue with weight loss efforts He has agreed to follow the Category 3 plan [name] has been instructed to work up to a goal of 150 minutes of combined cardio and strengthening exercise per week for weight loss and overall health benefits. We discussed the following Behavioral Modification Strategies today: increasing lean protein intake, decreasing simple carbohydrates, holiday eating strategies, travel eating strategies, and no skipping meals   Manual has agreed to follow up with our clinic in 2 to 3 weeks with Abby Potash, PA-C. He was informed of the importance of frequent follow up visits to maximize his success with intensive lifestyle modifications for his multiple health conditions.  ALLERGIES: Allergies  Allergen Reactions  . Pepcid [Famotidine] Other (See Comments)    Contraindicated with ODEFSEY  . Prilosec [Omeprazole] Other (See Comments)    Contraindicated with RPV (lowers level of this ARV in ODEFSEY    MEDICATIONS: Current Outpatient Medications on File Prior to Visit  Medication Sig Dispense Refill  . aspirin 81 MG tablet Take 1 tablet (81 mg total) by mouth every evening. 30 tablet   . chlorthalidone (HYGROTON) 25 MG tablet Take 1 tablet (25  mg total) by mouth daily. 90 tablet 2  . emtricitabine-rilpivir-tenofovir AF (ODEFSEY) 200-25-25 MG TABS tablet Take 1 tablet by mouth daily with breakfast. 30 tablet 11  . insulin glargine (LANTUS SOLOSTAR) 100 UNIT/ML injection Inject 60 Units into the skin at bedtime.    . insulin lispro (HUMALOG) 100 UNIT/ML injection Inject 10-16 Units into the skin 3 (three) times daily before meals.    [name] lisinopril  (PRINIVIL,ZESTRIL) 10 MG tablet Take 1 tablet (10 mg total) by mouth daily. (Patient taking differently: Take 10 mg by mouth every evening. ) 90 tablet 2  . rosuvastatin (CRESTOR) 40 MG tablet Take 1 tablet (40 mg total) by mouth daily. (Patient taking differently: Take 40 mg by mouth every evening. ) 90 tablet 2   No current facility-administered medications on file prior to visit.     PAST MEDICAL HISTORY: Past Medical History:  Diagnosis Date  . CAD (coronary artery disease)    DES.  /  ... Later (10/2007) intervention for in-stent restenosis ;  EF 60%... catheterization... June, 2009  . Chest pain   . Diabetes mellitus type 2, uncontrolled ([place]) 09/27/2014  . Diabetes mellitus type II   . DKA (diabetic ketoacidoses) ([place]) 12/2010; 03/23/2017  . Dyslipidemia   . Ejection fraction    EF 60%, catheterization, 2009  . Gallbladder problem   . History of heart attack   . History of kidney stones   . HIV (human immunodeficiency virus infection) ([place]) dx'd ~ 2002/2003  . HLD (hyperlipidemia)   . HTN (hypertension)   . 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  . Nausea and vomiting 03/23/2017  . 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 RPR  reverted to negative  . Sinus tachycardia    mild at rest.... October, 2010  . Syphilitic hepatitis     PAST SURGICAL HISTORY: Past Surgical History:  Procedure Laterality Date  . CHOLECYSTECTOMY OPEN  07/2001  . COLONOSCOPY N/A 05/28/2017   Procedure: COLONOSCOPY;  Surgeon: Rogene Houston, MD;  [facility];  Service: Endoscopy;  Laterality: N/A;  200 - pt to prep in Endo  . CORONARY ANGIOPLASTY WITH STENT PLACEMENT  03/2006; 10/2007;   . HERNIA REPAIR    . LAPAROSCOPIC INCISIONAL / UMBILICAL / VENTRAL HERNIA REPAIR  07/2001   UHR  . POLYPECTOMY  05/28/2017   Procedure: POLYPECTOMY;  Surgeon: Rogene Houston, MD;  [facility];  Service: Endoscopy;;  colon    SOCIAL HISTORY: Social History   Tobacco Use  . Smoking status: Never Smoker  . Smokeless tobacco: Never Used  Substance Use Topics  . Alcohol use: Yes    Alcohol/week: 0.0 - 1.0 standard drinks    Comment: 03/23/2017 "might have a drink 4-6 times/year"  . Drug use: No    FAMILY HISTORY: Family History  Problem Relation Age of Onset  . Heart attack Father        7 heart attacks  . CAD Father   . Alcoholism Father   . Hyperlipidemia Mother   . Hypertension Mother   . Anemia Unknown   . Diabetes Unknown   . CAD Sister   . Diabetes Sister     ROS: Review of Systems  Constitutional: Positive for weight loss.  Gastrointestinal: Negative for nausea and vomiting.  Musculoskeletal:       Negative muscle weakness    PHYSICAL EXAM: Blood pressure 133/74, pulse 74, temperature  97.8 F (36.6 C), temperature source Oral, height 5\' 6"  (1.676 m), weight 194 lb (88 kg), SpO2 97 %. Body mass index is 31.31 kg/m. Physical Exam Vitals signs reviewed.  Constitutional:      Appearance: Normal appearance. He is obese.  Cardiovascular:     Rate and Rhythm: Normal rate.     Pulses: Normal pulses.  Pulmonary:     Effort: Pulmonary effort is normal.  Musculoskeletal: Normal range of motion.  Skin:    General: Skin is warm and dry.  Neurological:     Mental Status: He is alert and oriented to person, place, and time.  Psychiatric:        Mood and Affect: Mood normal.        Behavior: Behavior normal.     RECENT LABS AND TESTS: BMET    Component Value Date/Time   NA 138 02/22/2018 1025   K 4.0 02/22/2018 1025   CL 102 02/22/2018 1025   CO2 25 02/22/2018 1025   GLUCOSE 149 (H) 02/22/2018 1025   BUN 15 02/22/2018 1025   CREATININE 0.82 02/22/2018 1025   CALCIUM 9.5 02/22/2018 1025   GFRNONAA 102 02/22/2018 1025   GFRAA 118 02/22/2018 1025   Lab Results  Component Value Date   HGBA1C 10.4 (H) 03/14/2018   HGBA1C 9.8 (A)  12/13/2017   HGBA1C 10.6 (H) 03/23/2017   HGBA1C 8.5 03/30/2009   No results found for: INSULIN CBC    Component Value Date/Time   WBC 7.3 02/22/2018 1025   RBC 5.39 02/22/2018 1025   HGB 15.4 02/22/2018 1025   HCT 44.7 02/22/2018 1025   PLT 286 02/22/2018 1025   MCV 82.9 02/22/2018 1025   MCH 28.6 02/22/2018 1025   MCHC 34.5 02/22/2018 1025   RDW 12.8 02/22/2018 1025   LYMPHSABS 2,949 02/22/2018 1025   MONOABS 0.3 03/23/2017 1524   EOSABS 241 02/22/2018 1025   BASOSABS 51 02/22/2018 1025   Iron/TIBC/Ferritin/ %Sat No results found for: IRON, TIBC, FERRITIN, IRONPCTSAT Lipid Panel     Component Value Date/Time   CHOL 135 02/22/2018 1025   TRIG 278 (H) 02/22/2018 1025   HDL 32 (L) 02/22/2018 1025   CHOLHDL 4.2 02/22/2018 1025   VLDL 59 (H) 10/28/2015 0957   LDLCALC 66 02/22/2018 1025   Hepatic Function Panel     Component Value Date/Time   PROT 6.4 02/22/2018 1025   ALBUMIN 4.0 03/23/2017 1524   AST 17 02/22/2018 1025   ALT 31 02/22/2018 1025   ALKPHOS 72 03/23/2017 1524   BILITOT 0.3 02/22/2018 1025       OBESITY BEHAVIORAL INTERVENTION VISIT  Today's visit was # 5   Starting weight: 199 lbs Starting date: 03/14/18 Today's weight : 194 lbs Today's date: 05/16/2018 Total lbs lost to date: 5    ASK: We discussed the diagnosis of obesity with [name] today and Almond agreed to give [name] permission to discuss obesity behavioral modification therapy today.  ASSESS: Fernie has the diagnosis of obesity and his BMI today is 31.33 Maya is in the action stage of change   ADVISE: Corbett was educated on the multiple health risks of obesity as well as the benefit of weight loss to improve his health. He was advised of the need for long term treatment and the importance of lifestyle modifications to improve his current health and to decrease his risk of future health problems.  AGREE: Multiple dietary modification options and treatment options were discussed  and  Doroteo  agreed to follow the recommendations documented in the above note.  ARRANGE: Perkins was educated on the importance of frequent visits to treat obesity as outlined per CMS and USPSTF guidelines and agreed to schedule his next follow up appointment today.  I, Trixie Dredge, am acting as transcriptionist for Dennard Nip, MD  I have reviewed the above documentation for accuracy and completeness, and I agree with the above. -Dennard Nip, MD

## 2018-05-20 ENCOUNTER — Other Ambulatory Visit: Payer: Self-pay | Admitting: Cardiology

## 2018-05-20 MED FILL — LANTUS SOLOSTAR 100 UNITS/M: 100 | 56 days supply | Qty: 60 | Fill #1

## 2018-05-20 MED FILL — LISINOPRIL 10 MG TABLET: 10 | 30 days supply | Qty: 30 | Fill #0

## 2018-05-20 MED FILL — SHIPPING COST: 1 days supply | Qty: 1 | Fill #16

## 2018-05-20 MED FILL — ODEFSEY 200-25-25 MG TABS: 200-25-25 | 30 days supply | Qty: 30 | Fill #3

## 2018-05-20 MED FILL — VIT D2 1.25 MG (50,000 UNIT: 1.25 MG | 28 days supply | Qty: 4 | Fill #0

## 2018-05-20 MED FILL — METOPROLOL SUCCINATE ER 100: 100 | 90 days supply | Qty: 90 | Fill #0

## 2018-05-20 MED FILL — HUMALOG 100 UNITS/ML KWIKPE: 100 | 90 days supply | Qty: 90 | Fill #3

## 2018-05-23 MED FILL — CHLORTHALIDONE 25 MG TABS: 25 | 30 days supply | Qty: 30 | Fill #0

## 2018-05-23 MED FILL — ROSUVASTATIN CALCIUM 40 MG: 40 | 30 days supply | Qty: 30 | Fill #0

## 2018-06-06 ENCOUNTER — Encounter (INDEPENDENT_AMBULATORY_CARE_PROVIDER_SITE_OTHER): Payer: Self-pay | Admitting: Physician Assistant

## 2018-06-06 ENCOUNTER — Ambulatory Visit (INDEPENDENT_AMBULATORY_CARE_PROVIDER_SITE_OTHER): Payer: 59 | Admitting: Physician Assistant

## 2018-06-06 VITALS — BP 117/72 | HR 75 | Temp 98.5°F | Ht 66.0 in | Wt 195.0 lb

## 2018-06-06 DIAGNOSIS — Z794 Long term (current) use of insulin: Secondary | ICD-10-CM | POA: Diagnosis not present

## 2018-06-06 DIAGNOSIS — E119 Type 2 diabetes mellitus without complications: Secondary | ICD-10-CM | POA: Diagnosis not present

## 2018-06-06 DIAGNOSIS — E669 Obesity, unspecified: Secondary | ICD-10-CM

## 2018-06-06 DIAGNOSIS — Z6831 Body mass index (BMI) 31.0-31.9, adult: Secondary | ICD-10-CM | POA: Diagnosis not present

## 2018-06-06 NOTE — Progress Notes (Signed)
Office: [phone]  /  Fax: [phone]   HPI:   Chief Complaint: OBESITY [name] is here to discuss his progress with his obesity treatment plan. He is on the Category 3 plan and is following his eating plan approximately 70 % of the time. He states he is walking for 45 minutes 1 time per week. [name] reports struggling with the plan over the last few weeks due to multiple funerals. He is traveling to [place] soon.  His weight is 195 lb (88.5 kg) today and has gained 1 pound since his last visit. He has lost 4 lbs since starting treatment with [name].  Diabetes II [name] has a diagnosis of diabetes type II. [name] states fasting BGs range between 68 and 157. He is on insulin and mealtime insulin on sliding scales. He denies polyphagia. Last A1c was 10.4 on 03/14/18. He has been working on intensive lifestyle modifications including diet, exercise, and weight loss to help control his blood glucose levels.  ALLERGIES: Allergies  Allergen Reactions  . Pepcid [Famotidine] Other (See Comments)    Contraindicated with ODEFSEY  . Prilosec [Omeprazole] Other (See Comments)    Contraindicated with RPV (lowers level of this ARV in ODEFSEY    MEDICATIONS: Current Outpatient Medications on File Prior to Visit  Medication Sig Dispense Refill  . aspirin 81 MG tablet Take 1 tablet (81 mg total) by mouth every evening. 30 tablet   . chlorthalidone (HYGROTON) 25 MG tablet Take 1 tablet (25 mg total) by mouth daily. Please make overdue annual appt for future refills. [phone]. 1st attempt. 30 tablet 0  . emtricitabine-rilpivir-tenofovir AF (ODEFSEY) 200-25-25 MG TABS tablet Take 1 tablet by mouth daily with breakfast. 30 tablet 11  . insulin glargine (LANTUS SOLOSTAR) 100 UNIT/ML injection Inject 60 Units into the skin at bedtime.    . insulin lispro (HUMALOG) 100 UNIT/ML injection Inject 10-16 Units into the skin 3 (three) times daily before meals.    [name] lisinopril (PRINIVIL,ZESTRIL) 10 MG tablet Take  1 tablet (10 mg total) by mouth daily. Please make overdue annual appt for future refills. [phone]. 1st attempt. 30 tablet 0  . rosuvastatin (CRESTOR) 40 MG tablet Take 1 tablet (40 mg total) by mouth daily. Please make overdue annual appt for future refills. [phone]. 1st attempt. 30 tablet 0  . Vitamin D, Ergocalciferol, (DRISDOL) 1.25 MG (50000 UT) CAPS capsule Take 1 capsule (50,000 Units total) by mouth every 7 (seven) days. 4 capsule 0   No current facility-administered medications on file prior to visit.     PAST MEDICAL HISTORY: Past Medical History:  Diagnosis Date  . CAD (coronary artery disease)    DES.  /  ... Later (10/2007) intervention for in-stent restenosis ;  EF 60%... catheterization... June, 2009  . Chest pain   . Diabetes mellitus type 2, uncontrolled ([place]) 09/27/2014  . Diabetes mellitus type II   . DKA (diabetic ketoacidoses) ([place]) 12/2010; 03/23/2017  . Dyslipidemia   . Ejection fraction    EF 60%, catheterization, 2009  . Gallbladder problem   . History of heart attack   . History of kidney stones   . HIV (human immunodeficiency virus infection) ([place]) dx'd ~ 2002/2003  . HLD (hyperlipidemia)   . HTN (hypertension)   . 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  . Nausea and vomiting 03/23/2017  . 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 RPR  reverted to negative  . Sinus  tachycardia    mild at rest.... October, 2010  . Syphilitic hepatitis     PAST SURGICAL HISTORY: Past Surgical History:  Procedure Laterality Date  . CHOLECYSTECTOMY OPEN  07/2001  . COLONOSCOPY N/A 05/28/2017   Procedure: COLONOSCOPY;  Surgeon: Rogene Houston, MD;  [facility];  Service: Endoscopy;  Laterality: N/A;  200 - pt to prep in Endo  . CORONARY ANGIOPLASTY WITH STENT PLACEMENT  03/2006; 10/2007;   . HERNIA REPAIR    . LAPAROSCOPIC INCISIONAL / UMBILICAL /  VENTRAL HERNIA REPAIR  07/2001   UHR  . POLYPECTOMY  05/28/2017   Procedure: POLYPECTOMY;  Surgeon: Rogene Houston, MD;  [facility];  Service: Endoscopy;;  colon    SOCIAL HISTORY: Social History   Tobacco Use  . Smoking status: Never Smoker  . Smokeless tobacco: Never Used  Substance Use Topics  . Alcohol use: Yes    Alcohol/week: 0.0 - 1.0 standard drinks    Comment: 03/23/2017 "might have a drink 4-6 times/year"  . Drug use: No    FAMILY HISTORY: Family History  Problem Relation Age of Onset  . Heart attack Father        7 heart attacks  . CAD Father   . Alcoholism Father   . Hyperlipidemia Mother   . Hypertension Mother   . Anemia Unknown   . Diabetes Unknown   . CAD Sister   . Diabetes Sister     ROS: Review of Systems  Constitutional: Negative for weight loss.  Endo/Heme/Allergies:       Negative polyphagia    PHYSICAL EXAM: Blood pressure 117/72, pulse 75, temperature 98.5 F (36.9 C), temperature source Oral, height 5\' 6"  (1.676 m), weight 195 lb (88.5 kg), SpO2 94 %. Body mass index is 31.47 kg/m. Physical Exam Vitals signs reviewed.  Constitutional:      Appearance: Normal appearance. He is obese.  Cardiovascular:     Rate and Rhythm: Normal rate.     Pulses: Normal pulses.  Pulmonary:     Effort: Pulmonary effort is normal.     Breath sounds: Normal breath sounds.  Musculoskeletal: Normal range of motion.  Skin:    General: Skin is warm and dry.  Neurological:     Mental Status: He is alert and oriented to person, place, and time.  Psychiatric:        Mood and Affect: Mood normal.        Behavior: Behavior normal.     RECENT LABS AND TESTS: BMET    Component Value Date/Time   NA 138 02/22/2018 1025   K 4.0 02/22/2018 1025   CL 102 02/22/2018 1025   CO2 25 02/22/2018 1025   GLUCOSE 149 (H) 02/22/2018 1025   BUN 15 02/22/2018 1025   CREATININE 0.82 02/22/2018 1025   CALCIUM 9.5 02/22/2018 1025   GFRNONAA 102  02/22/2018 1025   GFRAA 118 02/22/2018 1025   Lab Results  Component Value Date   HGBA1C 10.4 (H) 03/14/2018   HGBA1C 9.8 (A) 12/13/2017   HGBA1C 10.6 (H) 03/23/2017   HGBA1C 8.5 03/30/2009   No results found for: INSULIN CBC    Component Value Date/Time   WBC 7.3 02/22/2018 1025   RBC 5.39 02/22/2018 1025   HGB 15.4 02/22/2018 1025   HCT 44.7 02/22/2018 1025   PLT 286 02/22/2018 1025   MCV 82.9 02/22/2018 1025   MCH 28.6 02/22/2018 1025   MCHC 34.5 02/22/2018 1025   RDW 12.8 02/22/2018  [place] 02/22/2018 1025   MONOABS 0.3 03/23/2017 1524   EOSABS 241 02/22/2018 1025   BASOSABS 51 02/22/2018 1025   Iron/TIBC/Ferritin/ %Sat No results found for: IRON, TIBC, FERRITIN, IRONPCTSAT Lipid Panel     Component Value Date/Time   CHOL 135 02/22/2018 1025   TRIG 278 (H) 02/22/2018 1025   HDL 32 (L) 02/22/2018 1025   CHOLHDL 4.2 02/22/2018 1025   VLDL 59 (H) 10/28/2015 0957   LDLCALC 66 02/22/2018 1025   Hepatic Function Panel     Component Value Date/Time   PROT 6.4 02/22/2018 1025   ALBUMIN 4.0 03/23/2017 1524   AST 17 02/22/2018 1025   ALT 31 02/22/2018 1025   ALKPHOS 72 03/23/2017 1524   BILITOT 0.3 02/22/2018 1025     ASSESSMENT AND PLAN: Type 2 diabetes mellitus without complication, with long-term current use of insulin (HCC)  Class 1 obesity with serious comorbidity and body mass index (BMI) of 31.0 to 31.9 in adult, unspecified obesity type  PLAN:  Diabetes II [name] has been given extensive diabetes education by myself today including ideal fasting and post-prandial blood glucose readings, individual ideal Hgb A1c goals and hypoglycemia prevention. We discussed the importance of good blood sugar control to decrease the likelihood of diabetic complications such as nephropathy, neuropathy, limb loss, blindness, coronary artery disease, and death. We discussed the importance of intensive lifestyle modification including diet, exercise and weight  loss as the first line treatment for diabetes. [name] agrees to continue his diabetes medications and he agrees to follow up with our clinic in 3 to 4 weeks.  I spent > than 50% of the 15 minute visit on counseling as documented in the note.  Obesity [name] is currently in the action stage of change. As such, his goal is to continue with weight loss efforts He has agreed to follow the Category 3 plan [name] has been instructed to work up to a goal of 150 minutes of combined cardio and strengthening exercise per week for weight loss and overall health benefits. We discussed the following Behavioral Modification Strategies today: increasing lean protein intake, work on meal planning and easy cooking plans and travel eating strategies    [name] has agreed to follow up with our clinic in 3 to 4 weeks. He was informed of the importance of frequent follow up visits to maximize his success with intensive lifestyle modifications for his multiple health conditions.   OBESITY BEHAVIORAL INTERVENTION VISIT  Today's visit was # 6   Starting weight: 199 lbs Starting date: 03/14/18 Today's weight : 195 lbs Today's date: 06/06/2018 Total lbs lost to date: 4    ASK: We discussed the diagnosis of obesity with [name] today and [name] agreed to give [name] permission to discuss obesity behavioral modification therapy today.  ASSESS: [name] has the diagnosis of obesity and his BMI today is 31.49 [name] is in the action stage of change   ADVISE: Yama was educated on the multiple health risks of obesity as well as the benefit of weight loss to improve his health. He was advised of the need for long term treatment and the importance of lifestyle modifications.  AGREE: Multiple dietary modification options and treatment options were discussed and  [name] agreed to the above obesity treatment plan.  Wilhemena Durie, am acting as transcriptionist for Abby Potash, PA-C I, Abby Potash, PA-C have  reviewed above note and agree with its content

## 2018-06-14 ENCOUNTER — Other Ambulatory Visit (INDEPENDENT_AMBULATORY_CARE_PROVIDER_SITE_OTHER): Payer: Self-pay | Admitting: Family Medicine

## 2018-06-14 ENCOUNTER — Other Ambulatory Visit: Payer: Self-pay | Admitting: Cardiology

## 2018-06-14 DIAGNOSIS — E559 Vitamin D deficiency, unspecified: Secondary | ICD-10-CM

## 2018-06-15 MED FILL — LISINOPRIL 10 MG TABLET: 10 | 15 days supply | Qty: 15 | Fill #0

## 2018-06-15 MED FILL — ODEFSEY 200-25-25 MG TABS: 200-25-25 | 30 days supply | Qty: 30 | Fill #4

## 2018-06-15 MED FILL — FREESTYLE LIBRE 14 DAY SENS: 27 days supply | Qty: 2 | Fill #3

## 2018-06-16 MED FILL — CHLORTHALIDONE 25 MG TABS: 25 | 15 days supply | Qty: 15 | Fill #0

## 2018-06-16 MED FILL — ROSUVASTATIN CALCIUM 40 MG: 40 | 15 days supply | Qty: 15 | Fill #0

## 2018-06-27 ENCOUNTER — Ambulatory Visit (INDEPENDENT_AMBULATORY_CARE_PROVIDER_SITE_OTHER): Payer: 59 | Admitting: Physician Assistant

## 2018-06-27 VITALS — BP 147/76 | HR 80 | Temp 98.0°F | Ht 66.0 in | Wt 194.0 lb

## 2018-06-27 DIAGNOSIS — E119 Type 2 diabetes mellitus without complications: Secondary | ICD-10-CM

## 2018-06-27 DIAGNOSIS — Z6831 Body mass index (BMI) 31.0-31.9, adult: Secondary | ICD-10-CM | POA: Diagnosis not present

## 2018-06-27 DIAGNOSIS — E669 Obesity, unspecified: Secondary | ICD-10-CM

## 2018-06-27 NOTE — Progress Notes (Signed)
Office: [phone]  /  Fax: [phone]   HPI:   Chief Complaint: OBESITY Nivan is here to discuss his progress with his obesity treatment plan. He is on the Category 3 plan and is following his eating plan approximately 40 % of the time. He states he is exercising 0 minutes 0 times per week. Abdias did well with weight loss. He reports being on vacation and eating out most of the time. His weight is 194 lb (88 kg) today and has had a weight loss of 1 pounds over a period of 3 weeks since his last visit. He has lost 5 lbs since starting treatment with [name].  Diabetes II Victory has a diagnosis of diabetes type II. Tennyson states BGs range between 47 and 200 and denies any hypoglycemic episodes. He is on Insulin and mealtime insulin.  Last A1c was Hemoglobin A1C Latest Ref Rng & Units 03/14/2018 12/13/2017  HGBA1C 4.8 - 5.6 % 10.4(H) 9.8(A)  Some recent data might be hidden    He has been working on intensive lifestyle modifications including diet, exercise, and weight loss to help control his blood glucose levels.   ASSESSMENT AND PLAN:  Type 2 diabetes mellitus without complication, without long-term current use of insulin (HCC)  Class 1 obesity with serious comorbidity and body mass index (BMI) of 31.0 to 31.9 in adult, unspecified obesity type  PLAN:  Diabetes II Kerim has been given extensive diabetes education by myself today including ideal fasting and post-prandial blood glucose readings, individual ideal Hgb A1c goals  and hypoglycemia prevention. We discussed the importance of good blood sugar control to decrease the likelihood of diabetic complications such as nephropathy, neuropathy, limb loss, blindness, coronary artery disease, and death. We discussed the importance of intensive lifestyle modification including diet, exercise and weight loss as the first line treatment for diabetes. Adilson agrees to continue to continue with weight loss and taking his insulin. We will check  labs at next visit. Nelvin agrees to follow up with our clinic in 3 weeks.  I spent > than 50% of the 15 minute visit on counseling as documented in the note.  Obesity Malek is currently in the action stage of change. As such, his goal is to continue with weight loss efforts He has agreed to follow the Category 3 plan Almando has been instructed to work up to a goal of 150 minutes of combined cardio and strengthening exercise per week for weight loss and overall health benefits. We discussed the following Behavioral Modification Strategies today: decrease eating out and work on meal planning and easy cooking plans   Keelan has agreed to follow up with our clinic in 3 weeks. He was informed of the importance of frequent follow up visits to maximize his success with intensive lifestyle modifications for his multiple health conditions.  ALLERGIES: Allergies  Allergen Reactions  . Pepcid [Famotidine] Other (See Comments)    Contraindicated with ODEFSEY  . Prilosec [Omeprazole] Other (See Comments)    Contraindicated with RPV (lowers level of this ARV in ODEFSEY    MEDICATIONS: Current Outpatient Medications on File Prior to Visit  Medication Sig Dispense Refill  . aspirin 81 MG tablet Take 1 tablet (81 mg total) by mouth every evening. 30 tablet   . chlorthalidone (HYGROTON) 25 MG tablet Take 1 tablet (25 mg total) by mouth daily. Please make overdue appt with Dr. Meda Coffee before anymore refills. 2nd attempt 15 tablet 0  . emtricitabine-rilpivir-tenofovir AF (ODEFSEY) 200-25-25 MG TABS tablet Take  1 tablet by mouth daily with breakfast. 30 tablet 11  . insulin glargine (LANTUS SOLOSTAR) 100 UNIT/ML injection Inject 60 Units into the skin at bedtime.    . insulin lispro (HUMALOG) 100 UNIT/ML injection Inject 10-16 Units into the skin 3 (three) times daily before meals.    [name] lisinopril (PRINIVIL,ZESTRIL) 10 MG tablet Take 1 tablet (10 mg total) by mouth daily. Please make overdue appt with Dr.  Meda Coffee before anymore refills. 2nd attempt 15 tablet 0  . rosuvastatin (CRESTOR) 40 MG tablet Take 1 tablet (40 mg total) by mouth daily. Please make overdue appt with Dr. Meda Coffee before anymore refills. 2nd attempt 15 tablet 0  . Vitamin D, Ergocalciferol, (DRISDOL) 1.25 MG (50000 UT) CAPS capsule Take 1 capsule (50,000 Units total) by mouth every 7 (seven) days. 4 capsule 0   No current facility-administered medications on file prior to visit.     PAST MEDICAL HISTORY: Past Medical History:  Diagnosis Date  . CAD (coronary artery disease)    DES.  /  ... Later (10/2007) intervention for in-stent restenosis ;  EF 60%... catheterization... June, 2009  . Chest pain   . Diabetes mellitus type 2, uncontrolled ([place]) 09/27/2014  . Diabetes mellitus type II   . DKA (diabetic ketoacidoses) ([place]) 12/2010; 03/23/2017  . Dyslipidemia   . Ejection fraction    EF 60%, catheterization, 2009  . Gallbladder problem   . History of heart attack   . History of kidney stones   . HIV (human immunodeficiency virus infection) ([place]) dx'd ~ 2002/2003  . HLD (hyperlipidemia)   . HTN (hypertension)   . 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  . Nausea and vomiting 03/23/2017  . 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 RPR  reverted to negative  . Sinus tachycardia    mild at rest.... October, 2010  . Syphilitic hepatitis     PAST SURGICAL HISTORY: Past Surgical History:  Procedure Laterality Date  . CHOLECYSTECTOMY OPEN  07/2001  . COLONOSCOPY N/A 05/28/2017   Procedure: COLONOSCOPY;  Surgeon: Rogene Houston, MD;  [facility];  Service: Endoscopy;  Laterality: N/A;  200 - pt to prep in Endo  . CORONARY ANGIOPLASTY WITH STENT PLACEMENT  03/2006; 10/2007;   . HERNIA REPAIR    . LAPAROSCOPIC INCISIONAL / UMBILICAL / VENTRAL HERNIA REPAIR  07/2001   UHR  . POLYPECTOMY  05/28/2017   Procedure:  POLYPECTOMY;  Surgeon: Rogene Houston, MD;  [facility];  Service: Endoscopy;;  colon    SOCIAL HISTORY: Social History   Tobacco Use  . Smoking status: Never Smoker  . Smokeless tobacco: Never Used  Substance Use Topics  . Alcohol use: Yes    Alcohol/week: 0.0 - 1.0 standard drinks    Comment: 03/23/2017 "might have a drink 4-6 times/year"  . Drug use: No    FAMILY HISTORY: Family History  Problem Relation Age of Onset  . Heart attack Father        7 heart attacks  . CAD Father   . Alcoholism Father   . Hyperlipidemia Mother   . Hypertension Mother   . Anemia Unknown   . Diabetes Unknown   . CAD Sister   . Diabetes Sister     ROS: Review of Systems  Constitutional: Positive for weight loss.  Gastrointestinal: Negative for diarrhea, nausea and vomiting.  Endo/Heme/Allergies:       Negative for  hypoglycemia     PHYSICAL EXAM: Blood pressure (!) 147/76, pulse 80, temperature 98 F (36.7 C), temperature source Oral, height 5\' 6"  (1.676 m), weight 194 lb (88 kg), SpO2 95 %. Body mass index is 31.31 kg/m. Physical Exam Vitals signs reviewed.  Constitutional:      Appearance: Normal appearance. He is obese.  Cardiovascular:     Rate and Rhythm: Normal rate.  Pulmonary:     Effort: Pulmonary effort is normal.  Musculoskeletal: Normal range of motion.  Skin:    General: Skin is warm and dry.  Neurological:     Mental Status: He is alert and oriented to person, place, and time.  Psychiatric:        Mood and Affect: Mood normal.     RECENT LABS AND TESTS: BMET    Component Value Date/Time   NA 138 02/22/2018 1025   K 4.0 02/22/2018 1025   CL 102 02/22/2018 1025   CO2 25 02/22/2018 1025   GLUCOSE 149 (H) 02/22/2018 1025   BUN 15 02/22/2018 1025   CREATININE 0.82 02/22/2018 1025   CALCIUM 9.5 02/22/2018 1025   GFRNONAA 102 02/22/2018 1025   GFRAA 118 02/22/2018 1025   Lab Results  Component Value Date   HGBA1C 10.4 (H) 03/14/2018    HGBA1C 9.8 (A) 12/13/2017   HGBA1C 10.6 (H) 03/23/2017   HGBA1C 8.5 03/30/2009   No results found for: INSULIN CBC    Component Value Date/Time   WBC 7.3 02/22/2018 1025   RBC 5.39 02/22/2018 1025   HGB 15.4 02/22/2018 1025   HCT 44.7 02/22/2018 1025   PLT 286 02/22/2018 1025   MCV 82.9 02/22/2018 1025   MCH 28.6 02/22/2018 1025   MCHC 34.5 02/22/2018 1025   RDW 12.8 02/22/2018 1025   LYMPHSABS 2,949 02/22/2018 1025   MONOABS 0.3 03/23/2017 1524   EOSABS 241 02/22/2018 1025   BASOSABS 51 02/22/2018 1025   Iron/TIBC/Ferritin/ %Sat No results found for: IRON, TIBC, FERRITIN, IRONPCTSAT Lipid Panel     Component Value Date/Time   CHOL 135 02/22/2018 1025   TRIG 278 (H) 02/22/2018 1025   HDL 32 (L) 02/22/2018 1025   CHOLHDL 4.2 02/22/2018 1025   VLDL 59 (H) 10/28/2015 0957   LDLCALC 66 02/22/2018 1025   Hepatic Function Panel     Component Value Date/Time   PROT 6.4 02/22/2018 1025   ALBUMIN 4.0 03/23/2017 1524   AST 17 02/22/2018 1025   ALT 31 02/22/2018 1025   ALKPHOS 72 03/23/2017 1524   BILITOT 0.3 02/22/2018 1025       OBESITY BEHAVIORAL INTERVENTION VISIT  Today's visit was # 7   Starting weight: 199 lbs Starting date: 03/14/2018 Today's weight :194 lbs Today's date: 06/27/2018 Total lbs lost to date: 5  ASK: We discussed the diagnosis of obesity with [name] today and Razi agreed to give [name] permission to discuss obesity behavioral modification therapy today.  ASSESS: Jantzen has the diagnosis of obesity and his BMI today is 31.33 Mingo is in the action stage of change   ADVISE: Ferlando was educated on the multiple health risks of obesity as well as the benefit of weight loss to improve his health. He was advised of the need for long term treatment and the importance of lifestyle modifications to improve his current health and to decrease his risk of future health problems.  AGREE: Multiple dietary modification options and treatment options were  discussed and  Antario agreed to follow the recommendations documented  in the above note.  ARRANGE: Tryton was educated on the importance of frequent visits to treat obesity as outlined per CMS and USPSTF guidelines and agreed to schedule his next follow up appointment today.  I, Tammy Wysor, am acting as Location manager for [employer] I, Abby Potash, PA-C have reviewed above note and agree with its content

## 2018-06-28 ENCOUNTER — Encounter: Payer: Self-pay | Admitting: Cardiology

## 2018-07-11 MED FILL — FREESTYLE LIBRE 14 DAY SENS: 27 days supply | Qty: 2 | Fill #4

## 2018-07-11 MED FILL — ODEFSEY 200-25-25 MG TABS: 200-25-25 | 30 days supply | Qty: 30 | Fill #5

## 2018-07-18 ENCOUNTER — Encounter (INDEPENDENT_AMBULATORY_CARE_PROVIDER_SITE_OTHER): Payer: Self-pay | Admitting: Physician Assistant

## 2018-07-18 ENCOUNTER — Ambulatory Visit (INDEPENDENT_AMBULATORY_CARE_PROVIDER_SITE_OTHER): Payer: 59 | Admitting: Physician Assistant

## 2018-07-18 VITALS — BP 155/93 | HR 84 | Temp 98.0°F | Ht 66.0 in | Wt 197.0 lb

## 2018-07-18 DIAGNOSIS — Z794 Long term (current) use of insulin: Secondary | ICD-10-CM

## 2018-07-18 DIAGNOSIS — E7849 Other hyperlipidemia: Secondary | ICD-10-CM

## 2018-07-18 DIAGNOSIS — E559 Vitamin D deficiency, unspecified: Secondary | ICD-10-CM | POA: Diagnosis not present

## 2018-07-18 DIAGNOSIS — Z9189 Other specified personal risk factors, not elsewhere classified: Secondary | ICD-10-CM

## 2018-07-18 DIAGNOSIS — Z6831 Body mass index (BMI) 31.0-31.9, adult: Secondary | ICD-10-CM | POA: Diagnosis not present

## 2018-07-18 DIAGNOSIS — E119 Type 2 diabetes mellitus without complications: Secondary | ICD-10-CM | POA: Diagnosis not present

## 2018-07-18 DIAGNOSIS — E669 Obesity, unspecified: Secondary | ICD-10-CM

## 2018-07-18 MED ORDER — VITAMIN D (ERGOCALCIFEROL) 1.25 MG (50000 UNIT) PO CAPS: 50000.0000 [IU] | ORAL_CAPSULE | ORAL | 0 refills | Status: DC

## 2018-07-18 NOTE — Progress Notes (Signed)
Office: [phone]  /  Fax: [phone]   HPI:   Chief Complaint: OBESITY [name] is here to discuss his progress with his obesity treatment plan. He is on the Category 3 plan and is following his eating plan approximately 45% of the time. He states he is exercising 0 minutes 0 times per week. [name] reports that he has not been on plan due to working extra hours at work. He has been eating out and undereating his protein. His weight is 197 lb (89.4 kg) today and has had a weight gain of 3 lbs since his last visit. He has lost 2 lbs since starting treatment with [name].  Diabetes II [name] has a diagnosis of diabetes type II. [name] states fasting blood sugars range between 76 and 289. He is on insulin. He denies polyphagia and denies any hypoglycemic episodes. Last A1c was 10.4 on 03/14/2018. He has been working on intensive lifestyle modifications including diet, exercise, and weight loss to help control his blood glucose levels.   Hyperlipidemia [name] has hyperlipidemia and has been trying to improve his cholesterol levels with intensive lifestyle modification including a low saturated fat diet, exercise and weight loss. He is on Crestor and denies chest pain.  At risk for cardiovascular disease [name] is at a higher than average risk for cardiovascular disease due to obesity. He currently denies any chest pain.  Vitamin D deficiency [name] has a diagnosis of Vitamin D deficiency. He is currently taking prescription Vit D but has been out of Vitamin D for 2 weeks. He denies nausea, vomiting or muscle weakness.  ASSESSMENT AND PLAN:  Type 2 diabetes mellitus without complication, with long-term current use of insulin (HCC) - Plan: Comprehensive metabolic panel, Hemoglobin A1c  Other hyperlipidemia - Plan: Lipid Panel With LDL/HDL Ratio  Vitamin D deficiency - Plan: VITAMIN D 25 Hydroxy (Vit-D Deficiency, Fractures), Vitamin D, Ergocalciferol, (DRISDOL) 1.25 MG (50000 UT) CAPS capsule  At  risk for heart disease  Class 1 obesity with serious comorbidity and body mass index (BMI) of 31.0 to 31.9 in adult, unspecified obesity type  PLAN:  Diabetes II [name] has been given extensive diabetes education by myself today including ideal fasting and post-prandial blood glucose readings, individual ideal Hgb A1c goals  and hypoglycemia prevention. We discussed the importance of good blood sugar control to decrease the likelihood of diabetic complications such as nephropathy, neuropathy, limb loss, blindness, coronary artery disease, and death. We discussed the importance of intensive lifestyle modification including diet, exercise and weight loss as the first line treatment for diabetes. [name] will have labs drawn today. He agrees to continue his diabetes medications and will follow-up at the agreed upon time.  Hyperlipidemia [name] was informed of the American Heart Association Guidelines emphasizing intensive lifestyle modifications as the first line treatment for hyperlipidemia. We discussed many lifestyle modifications today in depth, and [name] will continue to work on decreasing saturated fats such as fatty red meat, butter and many fried foods. He will also increase vegetables and lean protein in his diet, continue to work on exercise and weight loss efforts, continue Crestor, and will have labs drawn today.  Cardiovascular risk counseling [name] was given extended (15 minutes) coronary artery disease prevention counseling today. He is 53 y.o. male and has risk factors for heart disease including obesity. We discussed intensive lifestyle modifications today with an emphasis on specific weight loss instructions and strategies. Pt was also informed of the importance of increasing exercise and decreasing saturated fats to help  prevent heart disease.  Vitamin D Deficiency [name] was informed that low Vitamin D levels contributes to fatigue and are associated with obesity, breast, and colon  cancer. He agrees to continue to take prescription Vit D @ 50,000 IU every week #4 with no refills and will follow-up for routine testing of Vitamin D, at least 2-3 times per year. He was informed of the risk of over-replacement of Vitamin D and agrees to not increase her dose unless she discusses this with [name] first. [name] agrees to follow-up with our clinic in 3 weeks.  Obesity [name] is currently in the action stage of change. As such, his goal is to continue with weight loss efforts. He has agreed to follow the Category 3 plan. [name] has been instructed to work up to a goal of 150 minutes of combined cardio and strengthening exercise per week for weight loss and overall health benefits. We discussed the following Behavioral Modification Strategies today: increasing lean protein intake, and work on meal planning and easy cooking plans.  [name] has agreed to follow-up with our clinic in 3 weeks. He was informed of the importance of frequent follow up visits to maximize his success with intensive lifestyle modifications for his multiple health conditions.  ALLERGIES: Allergies  Allergen Reactions  . Pepcid [Famotidine] Other (See Comments)    Contraindicated with ODEFSEY  . Prilosec [Omeprazole] Other (See Comments)    Contraindicated with RPV (lowers level of this ARV in ODEFSEY    MEDICATIONS: Current Outpatient Medications on File Prior to Visit  Medication Sig Dispense Refill  . aspirin 81 MG tablet Take 1 tablet (81 mg total) by mouth every evening. 30 tablet   . chlorthalidone (HYGROTON) 25 MG tablet Take 1 tablet (25 mg total) by mouth daily. Please make overdue appt with Dr. Meda Coffee before anymore refills. 2nd attempt 15 tablet 0  . emtricitabine-rilpivir-tenofovir AF (ODEFSEY) 200-25-25 MG TABS tablet Take 1 tablet by mouth daily with breakfast. 30 tablet 11  . insulin glargine (LANTUS SOLOSTAR) 100 UNIT/ML injection Inject 60 Units into the skin at bedtime.    . insulin lispro  (HUMALOG) 100 UNIT/ML injection Inject 10-16 Units into the skin 3 (three) times daily before meals.    [name] lisinopril (PRINIVIL,ZESTRIL) 10 MG tablet Take 1 tablet (10 mg total) by mouth daily. Please make overdue appt with Dr. Meda Coffee before anymore refills. 2nd attempt 15 tablet 0  . rosuvastatin (CRESTOR) 40 MG tablet Take 1 tablet (40 mg total) by mouth daily. Please make overdue appt with Dr. Meda Coffee before anymore refills. 2nd attempt 15 tablet 0   No current facility-administered medications on file prior to visit.     PAST MEDICAL HISTORY: Past Medical History:  Diagnosis Date  . CAD (coronary artery disease)    DES.  /  ... Later (10/2007) intervention for in-stent restenosis ;  EF 60%... catheterization... June, 2009  . Chest pain   . Diabetes mellitus type 2, uncontrolled ([place]) 09/27/2014  . Diabetes mellitus type II   . DKA (diabetic ketoacidoses) ([place]) 12/2010; 03/23/2017  . Dyslipidemia   . Ejection fraction    EF 60%, catheterization, 2009  . Gallbladder problem   . History of heart attack   . History of kidney stones   . HIV (human immunodeficiency virus infection) ([place]) dx'd ~ 2002/2003  . HLD (hyperlipidemia)   . HTN (hypertension)   . 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  . Nausea and vomiting 03/23/2017  . 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 RPR  reverted to negative  . Sinus tachycardia    mild at rest.... October, 2010  . Syphilitic hepatitis     PAST SURGICAL HISTORY: Past Surgical History:  Procedure Laterality Date  . CHOLECYSTECTOMY OPEN  07/2001  . COLONOSCOPY N/A 05/28/2017   Procedure: COLONOSCOPY;  Surgeon: Rogene Houston, MD;  [facility];  Service: Endoscopy;  Laterality: N/A;  200 - pt to prep in Endo  . CORONARY ANGIOPLASTY WITH STENT PLACEMENT  03/2006; 10/2007;   . HERNIA REPAIR    . LAPAROSCOPIC INCISIONAL / UMBILICAL /  VENTRAL HERNIA REPAIR  07/2001   UHR  . POLYPECTOMY  05/28/2017   Procedure: POLYPECTOMY;  Surgeon: Rogene Houston, MD;  [facility];  Service: Endoscopy;;  colon    SOCIAL HISTORY: Social History   Tobacco Use  . Smoking status: Never Smoker  . Smokeless tobacco: Never Used  Substance Use Topics  . Alcohol use: Yes    Alcohol/week: 0.0 - 1.0 standard drinks    Comment: 03/23/2017 "might have a drink 4-6 times/year"  . Drug use: No    FAMILY HISTORY: Family History  Problem Relation Age of Onset  . Heart attack Father        7 heart attacks  . CAD Father   . Alcoholism Father   . Hyperlipidemia Mother   . Hypertension Mother   . Anemia Other   . Diabetes Other   . CAD Sister   . Diabetes Sister    ROS: Review of Systems  Constitutional: Negative for weight loss.  Cardiovascular: Negative for chest pain.  Gastrointestinal: Negative for nausea and vomiting.  Musculoskeletal:       Negative for muscle weakness.  Endo/Heme/Allergies:       Negative for polyphagia. Negative for hypoglycemia.   PHYSICAL EXAM: Blood pressure (!) 155/93, pulse 84, temperature 98 F (36.7 C), temperature source Oral, height 5\' 6"  (1.676 m), weight 197 lb (89.4 kg), SpO2 97 %. Body mass index is 31.8 kg/m. Physical Exam Vitals signs reviewed.  Constitutional:      Appearance: Normal appearance. He is obese.  Cardiovascular:     Rate and Rhythm: Normal rate.     Pulses: Normal pulses.  Pulmonary:     Effort: Pulmonary effort is normal.     Breath sounds: Normal breath sounds.  Musculoskeletal: Normal range of motion.  Skin:    General: Skin is warm and dry.  Neurological:     Mental Status: He is alert and oriented to person, place, and time.  Psychiatric:        Behavior: Behavior normal.   RECENT LABS AND TESTS: BMET    Component Value Date/Time   NA 138 02/22/2018 1025   K 4.0 02/22/2018 1025   CL 102 02/22/2018 1025   CO2 25 02/22/2018 1025   GLUCOSE 149  (H) 02/22/2018 1025   BUN 15 02/22/2018 1025   CREATININE 0.82 02/22/2018 1025   CALCIUM 9.5 02/22/2018 1025   GFRNONAA 102 02/22/2018 1025   GFRAA 118 02/22/2018 1025   Lab Results  Component Value Date   HGBA1C 10.4 (H) 03/14/2018   HGBA1C 9.8 (A) 12/13/2017   HGBA1C 10.6 (H) 03/23/2017   HGBA1C 8.5 03/30/2009   No results found for: INSULIN CBC    Component Value Date/Time   WBC 7.3 02/22/2018 1025   RBC 5.39 02/22/2018 1025   HGB 15.4 02/22/2018 1025  HCT 44.7 02/22/2018 1025   PLT 286 02/22/2018 1025   MCV 82.9 02/22/2018 1025   MCH 28.6 02/22/2018 1025   MCHC 34.5 02/22/2018 1025   RDW 12.8 02/22/2018 1025   LYMPHSABS 2,949 02/22/2018 1025   MONOABS 0.3 03/23/2017 1524   EOSABS 241 02/22/2018 1025   BASOSABS 51 02/22/2018 1025   Iron/TIBC/Ferritin/ %Sat No results found for: IRON, TIBC, FERRITIN, IRONPCTSAT Lipid Panel     Component Value Date/Time   CHOL 135 02/22/2018 1025   TRIG 278 (H) 02/22/2018 1025   HDL 32 (L) 02/22/2018 1025   CHOLHDL 4.2 02/22/2018 1025   VLDL 59 (H) 10/28/2015 0957   LDLCALC 66 02/22/2018 1025   Hepatic Function Panel     Component Value Date/Time   PROT 6.4 02/22/2018 1025   ALBUMIN 4.0 03/23/2017 1524   AST 17 02/22/2018 1025   ALT 31 02/22/2018 1025   ALKPHOS 72 03/23/2017 1524   BILITOT 0.3 02/22/2018 1025     Ref. Range 03/14/2018 09:50  Vitamin D, 25-Hydroxy Latest Ref Range: 30.0 - 100.0 ng/mL 9.4 (L)   OBESITY BEHAVIORAL INTERVENTION VISIT  Today's visit was #8  Starting weight: 199 lbs Starting date: 03/14/2018 Today's weight: 197 lbs  Today's date: 07/18/2018 Total lbs lost to date: 2    07/18/2018  Height 5\' 6"  (1.676 m)  Weight 197 lb (89.4 kg)  BMI (Calculated) 31.81  BLOOD PRESSURE - SYSTOLIC 865  BLOOD PRESSURE - DIASTOLIC 93   Body Fat % 78.4 %  Total Body Water (lbs) 98 lbs   ASK: We discussed the diagnosis of obesity with [name] today and [name] agreed to give [name] permission to  discuss obesity behavioral modification therapy today.  ASSESS: [name] has the diagnosis of obesity and his BMI today is 31.81. [name] is in the action stage of change.   ADVISE: [name] was educated on the multiple health risks of obesity as well as the benefit of weight loss to improve his health. He was advised of the need for long term treatment and the importance of lifestyle modifications to improve his current health and to decrease his risk of future health problems.  AGREE: Multiple dietary modification options and treatment options were discussed and  [name] agreed to follow the recommendations documented in the above note.  ARRANGE: [name] was educated on the importance of frequent visits to treat obesity as outlined per CMS and USPSTF guidelines and agreed to schedule his next follow up appointment today.  Migdalia Dk, am acting as transcriptionist for Abby Potash, PA-C I, Abby Potash, PA-C have reviewed above note and agree with its content

## 2018-07-19 ENCOUNTER — Encounter: Payer: Self-pay | Admitting: Cardiology

## 2018-07-19 ENCOUNTER — Ambulatory Visit: Payer: 59 | Admitting: Cardiology

## 2018-07-19 VITALS — BP 132/74 | HR 75 | Ht 66.0 in | Wt 202.8 lb

## 2018-07-19 DIAGNOSIS — E785 Hyperlipidemia, unspecified: Secondary | ICD-10-CM | POA: Diagnosis not present

## 2018-07-19 LAB — LIPID PANEL WITH LDL/HDL RATIO
Cholesterol, Total: 187 mg/dL (ref 100–199)
HDL: 41 mg/dL (ref >39)
LDL Calculated: 103 mg/dL — ABNORMAL HIGH (ref 0–99)
LDl/HDL Ratio: 2.5 ratio (ref 0.0–3.6)
Triglycerides: 216 mg/dL — ABNORMAL HIGH (ref 0–149)
VLDL Cholesterol Cal: 43 mg/dL — ABNORMAL HIGH (ref 5–40)

## 2018-07-19 LAB — COMPREHENSIVE METABOLIC PANEL
ALT: 29 IU/L (ref 0–44)
AST: 24 IU/L (ref 0–40)
Albumin/Globulin Ratio: 1.8 (ref 1.2–2.2)
Albumin: 4.2 g/dL (ref 3.8–4.9)
Alkaline Phosphatase: 56 IU/L (ref 39–117)
BUN/Creatinine Ratio: 17 (ref 9–20)
BUN: 12 mg/dL (ref 6–24)
Bilirubin Total: 0.3 mg/dL (ref 0.0–1.2)
CO2: 24 mmol/L (ref 20–29)
Calcium: 9.4 mg/dL (ref 8.7–10.2)
Chloride: 99 mmol/L (ref 96–106)
Creatinine, Ser: 0.72 mg/dL — ABNORMAL LOW (ref 0.76–1.27)
GFR calc Af Amer: 123 mL/min/{1.73_m2} (ref >59)
GFR calc non Af Amer: 107 mL/min/{1.73_m2} (ref >59)
Globulin, Total: 2.4 g/dL (ref 1.5–4.5)
Glucose: 135 mg/dL — ABNORMAL HIGH (ref 65–99)
Potassium: 4.2 mmol/L (ref 3.5–5.2)
Sodium: 141 mmol/L (ref 134–144)
Total Protein: 6.6 g/dL (ref 6.0–8.5)

## 2018-07-19 LAB — HEMOGLOBIN A1C
Est. average glucose Bld gHb Est-mCnc: 246 mg/dL
Hgb A1c MFr Bld: 10.2 % — ABNORMAL HIGH (ref 4.8–5.6)

## 2018-07-19 LAB — VITAMIN D 25 HYDROXY (VIT D DEFICIENCY, FRACTURES): Vit D, 25-Hydroxy: 27.3 ng/mL — ABNORMAL LOW (ref 30.0–100.0)

## 2018-07-19 MED ORDER — ROSUVASTATIN CALCIUM 40 MG PO TABS: 40.0000 mg | ORAL_TABLET | Freq: Every day | ORAL | 3 refills | Status: DC

## 2018-07-19 MED ORDER — CHLORTHALIDONE 25 MG PO TABS: 25.0000 mg | ORAL_TABLET | Freq: Every day | ORAL | 3 refills | Status: DC

## 2018-07-19 MED ORDER — EZETIMIBE 10 MG PO TABS: 10.0000 mg | ORAL_TABLET | Freq: Every day | ORAL | 3 refills | Status: DC

## 2018-07-19 MED ORDER — LISINOPRIL 10 MG PO TABS: 10.0000 mg | ORAL_TABLET | Freq: Every day | ORAL | 3 refills | Status: DC

## 2018-07-19 MED FILL — EZETIMIBE 10 MG TABS: 10 | 90 days supply | Qty: 90 | Fill #0

## 2018-07-19 MED FILL — SHIPPING COST: 1 days supply | Qty: 1 | Fill #0

## 2018-07-19 MED FILL — LISINOPRIL 10 MG TABLET: 10 | 90 days supply | Qty: 90 | Fill #0

## 2018-07-19 MED FILL — CHLORTHALIDONE 25 MG TABS: 25 | 90 days supply | Qty: 90 | Fill #0

## 2018-07-19 MED FILL — ROSUVASTATIN CALCIUM 40 MG: 40 | 90 days supply | Qty: 90 | Fill #0

## 2018-07-19 MED FILL — VIT D2 1.25 MG (50,000 UNIT: 1.25 MG | 28 days supply | Qty: 4 | Fill #0

## 2018-07-19 NOTE — Progress Notes (Signed)
07/19/2018 [name]   24-Mar-1966  [id number]  Primary Physician Asencion Noble, MD Primary Cardiologist: No primary care provider on file.  Electrophysiologist: None   Reason for Visit/CC:  Yearly F/u for CAD  HPI:  [name] is a 53 y.o. male who is being seen today for 1 year f/u for CAD, HTN and HLD. He is a former patient of Dr. Ron [name], but now followed by Dr. Meda Coffee. Last seen in clinic 12/01/16. He has a h/o HIV on antiretroviral medications, CAD s/p 2 stent placements in 2009, HTN and poorly controlled HLD with hereditary hypertriglyceridemia. He is on Crestor, along with ASA, lisinopril, metoprolol and chlorthalidone. He had a lipid panel done yesterday at PCP office that showed LDL at 103 mg/dL. TG 218. Hgb A1c was poorly controlled at 10. CMP showed normal hepatic and renal function and normal electrolytes. His PCP is Dr. Asencion Noble.  He reports that he has done well from a cardiac standpoint since his last OV.  He was however admitted in October 2018 for DKA but has had no cardiac issues.  He recalls that his angina in 2009 felt like substernal chest pressure/ indigestion.  He denies any recent anginal symptomatology. No exertional CP or dyspnea.  No indigestion. He reports full med compliance.  He has enrolled in a wellness program that will focus on lifestyle modifications including changes in diet and increasing physical activity for weight loss.  He denies any tobacco use.  He had labs done yesterday at the wellness center.  LDL was 103.  Triglycerides were 176.  Hemoglobin A1c was 10.2.  Comprehensive metabolic panel showed normal renal and hepatic function.  Electrolytes were also normal.  Blood pressure today shows normal at 132/74.  Cardiac Studies   Cath report no available for review- s/p PCI in 2009  2D Echo 2013   Study Conclusions  - Left ventricle: The cavity size was normal. Wall thickness was normal. Systolic function was normal. The estimated ejection  fraction was in the range of 55% to 60%. Wall motion was normal; there were no regional wall motion abnormalities. Features are consistent with a pseudonormal left ventricular filling pattern, with concomitant abnormal relaxation and increased filling pressure (grade 2 diastolic dysfunction). - Atrial septum: No defect or patent foramen ovale was identified.  Current Meds  Medication Sig  . aspirin 81 MG tablet Take 1 tablet (81 mg total) by mouth every evening.  . chlorthalidone (HYGROTON) 25 MG tablet Take 1 tablet (25 mg total) by mouth daily.  [name] emtricitabine-rilpivir-tenofovir AF (ODEFSEY) 200-25-25 MG TABS tablet Take 1 tablet by mouth daily with breakfast.  . insulin glargine (LANTUS SOLOSTAR) 100 UNIT/ML injection Inject 60 Units into the skin at bedtime.  . insulin lispro (HUMALOG) 100 UNIT/ML injection Inject 10-16 Units into the skin 3 (three) times daily before meals.  [name] lisinopril (PRINIVIL,ZESTRIL) 10 MG tablet Take 1 tablet (10 mg total) by mouth daily.  . rosuvastatin (CRESTOR) 40 MG tablet Take 1 tablet (40 mg total) by mouth daily.  . Vitamin D, Ergocalciferol, (DRISDOL) 1.25 MG (50000 UT) CAPS capsule Take 1 capsule (50,000 Units total) by mouth every 7 (seven) days.  . [DISCONTINUED] chlorthalidone (HYGROTON) 25 MG tablet Take 1 tablet (25 mg total) by mouth daily. Please make overdue appt with Dr. Meda Coffee before anymore refills. 2nd attempt  . [DISCONTINUED] lisinopril (PRINIVIL,ZESTRIL) 10 MG tablet Take 1 tablet (10 mg total) by mouth daily. Please make overdue appt with Dr. Meda Coffee before anymore refills.  2nd attempt  . [DISCONTINUED] rosuvastatin (CRESTOR) 40 MG tablet Take 1 tablet (40 mg total) by mouth daily. Please make overdue appt with Dr. Meda Coffee before anymore refills. 2nd attempt   Allergies  Allergen Reactions  . Pepcid [Famotidine] Other (See Comments)    Contraindicated with ODEFSEY  . Prilosec [Omeprazole] Other (See Comments)     Contraindicated with RPV (lowers level of this ARV in ODEFSEY   Past Medical History:  Diagnosis Date  . CAD (coronary artery disease)    DES.  /  ... Later (10/2007) intervention for in-stent restenosis ;  EF 60%... catheterization... June, 2009  . Chest pain   . Diabetes mellitus type 2, uncontrolled ([place]) 09/27/2014  . Diabetes mellitus type II   . DKA (diabetic ketoacidoses) ([place]) 12/2010; 03/23/2017  . Dyslipidemia   . Ejection fraction    EF 60%, catheterization, 2009  . Gallbladder problem   . History of heart attack   . History of kidney stones   . HIV (human immunodeficiency virus infection) ([place]) dx'd ~ 2002/2003  . HLD (hyperlipidemia)   . HTN (hypertension)   . 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  . Nausea and vomiting 03/23/2017  . 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 RPR  reverted to negative  . Sinus tachycardia    mild at rest.... October, 2010  . Syphilitic hepatitis    Family History  Problem Relation Age of Onset  . Heart attack Father        7 heart attacks  . CAD Father   . Alcoholism Father   . Hyperlipidemia Mother   . Hypertension Mother   . Anemia Other   . Diabetes Other   . CAD Sister   . Diabetes Sister    Past Surgical History:  Procedure Laterality Date  . CHOLECYSTECTOMY OPEN  07/2001  . COLONOSCOPY N/A 05/28/2017   Procedure: COLONOSCOPY;  Surgeon: Rogene Houston, MD;  [facility];  Service: Endoscopy;  Laterality: N/A;  200 - pt to prep in Endo  . CORONARY ANGIOPLASTY WITH STENT PLACEMENT  03/2006; 10/2007;   . HERNIA REPAIR    . LAPAROSCOPIC INCISIONAL / UMBILICAL / VENTRAL HERNIA REPAIR  07/2001   UHR  . POLYPECTOMY  05/28/2017   Procedure: POLYPECTOMY;  Surgeon: Rogene Houston, MD;  [facility];  Service: Endoscopy;;  colon   Social History   Socioeconomic History  . Marital status: Single     Spouse name: Not on file  . Number of children: Not on file  . Years of education: Not on file  . Highest education level: Not on file  Occupational History  . Not on file  Social Needs  . Financial resource strain: Not on file  . Food insecurity:    Worry: Not on file    Inability: Not on file  . Transportation needs:    Medical: Not on file    Non-medical: Not on file  Tobacco Use  . Smoking status: Never Smoker  . Smokeless tobacco: Never Used  Substance and Sexual Activity  . Alcohol use: Yes    Alcohol/week: 0.0 - 1.0 standard drinks    Comment: 03/23/2017 "might have a drink 4-6 times/year"  . Drug use: No  . Sexual activity: Not Currently  Lifestyle  . Physical activity:    Days per week: Not on file    Minutes per session: Not on file  .  Stress: Not on file  Relationships  . Social connections:    Talks on phone: Not on file    Gets together: Not on file    Attends religious service: Not on file    Active member of club or organization: Not on file    Attends meetings of clubs or organizations: Not on file    Relationship status: Not on file  . Intimate partner violence:    Fear of current or ex partner: Not on file    Emotionally abused: Not on file    Physically abused: Not on file    Forced sexual activity: Not on file  Other Topics Concern  . Not on file  Social History Narrative  . Not on file     Lipid Panel     Component Value Date/Time   CHOL 187 07/18/2018 1023   TRIG 216 (H) 07/18/2018 1023   HDL 41 07/18/2018 1023   CHOLHDL 4.2 02/22/2018 1025   VLDL 59 (H) 10/28/2015 0957   LDLCALC 103 (H) 07/18/2018 1023   LDLCALC 66 02/22/2018 1025    Review of Systems: General: negative for chills, fever, night sweats or weight changes.  Cardiovascular: negative for chest pain, dyspnea on exertion, edema, orthopnea, palpitations, paroxysmal nocturnal dyspnea or shortness of breath Dermatological: negative for rash Respiratory: negative for cough or  wheezing Urologic: negative for hematuria Abdominal: negative for nausea, vomiting, diarrhea, bright red blood per rectum, melena, or hematemesis Neurologic: negative for visual changes, syncope, or dizziness All other systems reviewed and are otherwise negative except as noted above.   Physical Exam:  Blood pressure 132/74, pulse 75, height 5\' 6"  (1.676 m), weight 202 lb 12.8 oz (92 kg), SpO2 96 %.  General appearance: alert, cooperative and no distress Neck: no carotid bruit and no JVD Lungs: clear to auscultation bilaterally Heart: regular rate and rhythm, S1, S2 normal, no murmur, click, rub or gallop Extremities: extremities normal, atraumatic, no cyanosis or edema Pulses: 2+ and symmetric Skin: Skin color, texture, turgor normal. No rashes or lesions Neurologic: Grossly normal  EKG not performed -- personally reviewed   ASSESSMENT AND PLAN:   1. 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 Stable w/o anginal symptoms. No exertional CP or dyspnea. Continue ASA, statin, BB and ACE.   2. HLD: h/o hereditary hypertriglyceridemia. His last lipid panel was done yesterday. LDL 103. TG  216 (previously 1,157).  He is on Crestor 40 mg. CMP showed normal hepatic function. Recommend addition of Zetia 10 mg daily.  Continue Crestor 40.  We will repeat fasting lipid panel and hepatic function test in 8 weeks.  3. HTN:  Controlled on current regimen.  132/74 today.  He had a comprehensive metabolic panel yesterday which showed normal renal function and electrolytes.  We will continue chlorthalidone and lisinopril.  4. HIV:  on antiretroviral therapy   5. DM: followed by PCP. Poorly controlled. Hgb A1c yesterday was 10.2.  Patient encouraged to continue along with his wellness program.  6. Diastolic Dysfunction: grade 2DD noted on echo in 2013.  Volume stable on exam.  He denies any dyspnea.  No edema.  Blood pressure is well controlled.     Follow-Up in 1 year w/ Dr. Meda Coffee.   Brittainy Ladoris Gene, MHS [facility] 07/19/2018 8:50 AM

## 2018-07-19 NOTE — Patient Instructions (Signed)
Medication Instructions:  START Zetia 10 mg DAILY If you need a refill on your cardiac medications before your next appointment, please call your pharmacy.   Lab work: Please schedule to be done in 8 WEEKS  FLP HFT If you have labs (blood work) drawn today and your tests are completely normal, you will receive your results only by: [name] MyChart Message (if you have MyChart) OR . A paper copy in the mail If you have any lab test that is abnormal or we need to change your treatment, we will call you to review the results.  Testing/Procedures: NONE  Follow-Up: At [facility], you and your health needs are our priority.  As part of our continuing mission to provide you with exceptional heart care, we have created designated Provider Care Teams.  These Care Teams include your primary Cardiologist (physician) and Advanced Practice Providers (APPs -  Physician Assistants and Nurse Practitioners) who all work together to provide you with the care you need, when you need it. You will need a follow up appointment in 1 years.  Please call our office 2 months in advance to schedule this appointment.  You may see Dr Meda Coffee or one of the following Advanced Practice Providers on your designated Care Team:   [place], PA-C Melina Copa, PA-C . Ermalinda Barrios, PA-C  Any Other Special Instructions Will Be Listed Below (If Applicable).

## 2018-08-01 ENCOUNTER — Other Ambulatory Visit (INDEPENDENT_AMBULATORY_CARE_PROVIDER_SITE_OTHER): Payer: Self-pay | Admitting: Physician Assistant

## 2018-08-01 DIAGNOSIS — E559 Vitamin D deficiency, unspecified: Secondary | ICD-10-CM

## 2018-08-04 MED FILL — FREESTYLE LIBRE 14 DAY SENS: 81 days supply | Qty: 6 | Fill #5

## 2018-08-04 MED FILL — ODEFSEY 200-25-25 MG TABS: 200-25-25 | 30 days supply | Qty: 30 | Fill #6

## 2018-08-04 MED FILL — SHIPPING COST: 1 days supply | Qty: 1 | Fill #1

## 2018-08-08 ENCOUNTER — Other Ambulatory Visit: Payer: Self-pay

## 2018-08-08 ENCOUNTER — Ambulatory Visit (INDEPENDENT_AMBULATORY_CARE_PROVIDER_SITE_OTHER): Payer: 59 | Admitting: Physician Assistant

## 2018-08-08 ENCOUNTER — Encounter (INDEPENDENT_AMBULATORY_CARE_PROVIDER_SITE_OTHER): Payer: Self-pay | Admitting: Physician Assistant

## 2018-08-08 VITALS — BP 143/81 | HR 74 | Temp 98.2°F | Ht 66.0 in | Wt 196.0 lb

## 2018-08-08 DIAGNOSIS — E669 Obesity, unspecified: Secondary | ICD-10-CM

## 2018-08-08 DIAGNOSIS — Z9189 Other specified personal risk factors, not elsewhere classified: Secondary | ICD-10-CM

## 2018-08-08 DIAGNOSIS — Z6831 Body mass index (BMI) 31.0-31.9, adult: Secondary | ICD-10-CM

## 2018-08-08 DIAGNOSIS — E119 Type 2 diabetes mellitus without complications: Secondary | ICD-10-CM

## 2018-08-08 DIAGNOSIS — E559 Vitamin D deficiency, unspecified: Secondary | ICD-10-CM

## 2018-08-08 MED ORDER — VITAMIN D (ERGOCALCIFEROL) 1.25 MG (50000 UNIT) PO CAPS: 50000.0000 [IU] | ORAL_CAPSULE | ORAL | 0 refills | Status: DC

## 2018-08-08 MED ORDER — SEMAGLUTIDE 3 MG PO TABS: 3.0000 mg | ORAL_TABLET | Freq: Every day | ORAL | 0 refills | Status: DC

## 2018-08-08 NOTE — Progress Notes (Signed)
Office: [phone]  /  Fax: [phone]   HPI:   Chief Complaint: OBESITY [name] is here to discuss his progress with his obesity treatment plan. He is on the Category 3 plan and is following his eating plan approximately 70 % of the time. He states he is walking for 60 minutes 1 time per week. [name] did well with weight loss. He reports that he has had a decrease in his appetite recently, but is unsure of the cause. He is under a lot of stress at work.  His weight is 196 lb (88.9 kg) today and has had a weight loss of 1 pound over a period of 3 weeks since his last visit. He has lost 3 lbs since starting treatment with [name].  Diabetes II [name] has a diagnosis of diabetes type II. [name] states fasting BGs range between 120 and 310, mostly in 120's. He denies hypoglycemia or polyphagia. Last A1c was 10.2. He has been working on intensive lifestyle modifications including diet, exercise, and weight loss to help control his blood glucose levels.  Vitamin D Deficiency [name] has a diagnosis of vitamin D deficiency. He is currently taking prescription Vit D. Last Vit D level at goal. He denies nausea, vomiting or muscle weakness.  At risk for osteopenia and osteoporosis [name] is at higher risk of osteopenia and osteoporosis due to vitamin D deficiency.   ALLERGIES: Allergies  Allergen Reactions  . Pepcid [Famotidine] Other (See Comments)    Contraindicated with ODEFSEY  . Prilosec [Omeprazole] Other (See Comments)    Contraindicated with RPV (lowers level of this ARV in ODEFSEY    MEDICATIONS: Current Outpatient Medications on File Prior to Visit  Medication Sig Dispense Refill  . aspirin 81 MG tablet Take 1 tablet (81 mg total) by mouth every evening. 30 tablet   . chlorthalidone (HYGROTON) 25 MG tablet Take 1 tablet (25 mg total) by mouth daily. 90 tablet 3  . emtricitabine-rilpivir-tenofovir AF (ODEFSEY) 200-25-25 MG TABS tablet Take 1 tablet by mouth daily with breakfast. 30 tablet  11  . ezetimibe (ZETIA) 10 MG tablet Take 1 tablet (10 mg total) by mouth daily. 90 tablet 3  . insulin glargine (LANTUS SOLOSTAR) 100 UNIT/ML injection Inject 60 Units into the skin at bedtime.    . insulin lispro (HUMALOG) 100 UNIT/ML injection Inject 10-16 Units into the skin 3 (three) times daily before meals.    [name] lisinopril (PRINIVIL,ZESTRIL) 10 MG tablet Take 1 tablet (10 mg total) by mouth daily. 90 tablet 3  . rosuvastatin (CRESTOR) 40 MG tablet Take 1 tablet (40 mg total) by mouth daily. 90 tablet 3   No current facility-administered medications on file prior to visit.     PAST MEDICAL HISTORY: Past Medical History:  Diagnosis Date  . CAD (coronary artery disease)    DES.  /  ... Later (10/2007) intervention for in-stent restenosis ;  EF 60%... catheterization... June, 2009  . Chest pain   . Diabetes mellitus type 2, uncontrolled ([place]) 09/27/2014  . Diabetes mellitus type II   . DKA (diabetic ketoacidoses) ([place]) 12/2010; 03/23/2017  . Dyslipidemia   . Ejection fraction    EF 60%, catheterization, 2009  . Gallbladder problem   . History of heart attack   . History of kidney stones   . HIV (human immunodeficiency virus infection) ([place]) dx'd ~ 2002/2003  . HLD (hyperlipidemia)   . HTN (hypertension)   . 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  [name]  Nausea and vomiting 03/23/2017  . 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 RPR  reverted to negative  . Sinus tachycardia    mild at rest.... October, 2010  . Syphilitic hepatitis     PAST SURGICAL HISTORY: Past Surgical History:  Procedure Laterality Date  . CHOLECYSTECTOMY OPEN  07/2001  . COLONOSCOPY N/A 05/28/2017   Procedure: COLONOSCOPY;  Surgeon: Rogene Houston, MD;  [facility];  Service: Endoscopy;  Laterality: N/A;  200 - pt to prep in Endo  . CORONARY ANGIOPLASTY WITH STENT PLACEMENT  03/2006; 10/2007;    . HERNIA REPAIR    . LAPAROSCOPIC INCISIONAL / UMBILICAL / VENTRAL HERNIA REPAIR  07/2001   UHR  . POLYPECTOMY  05/28/2017   Procedure: POLYPECTOMY;  Surgeon: Rogene Houston, MD;  [facility];  Service: Endoscopy;;  colon    SOCIAL HISTORY: Social History   Tobacco Use  . Smoking status: Never Smoker  . Smokeless tobacco: Never Used  Substance Use Topics  . Alcohol use: Yes    Alcohol/week: 0.0 - 1.0 standard drinks    Comment: 03/23/2017 "might have a drink 4-6 times/year"  . Drug use: No    FAMILY HISTORY: Family History  Problem Relation Age of Onset  . Heart attack Father        7 heart attacks  . CAD Father   . Alcoholism Father   . Hyperlipidemia Mother   . Hypertension Mother   . Anemia Other   . Diabetes Other   . CAD Sister   . Diabetes Sister     ROS: Review of Systems  Constitutional: Positive for weight loss.  Gastrointestinal: Negative for nausea and vomiting.  Musculoskeletal:       Negative muscle weakness  Endo/Heme/Allergies:       Negative hypoglycemia Negative polyphagia    PHYSICAL EXAM: Blood pressure (!) 143/81, pulse 74, temperature 98.2 F (36.8 C), temperature source Oral, height 5\' 6"  (1.676 m), weight 196 lb (88.9 kg), SpO2 97 %. Body mass index is 31.64 kg/m. Physical Exam Vitals signs reviewed.  Constitutional:      Appearance: Normal appearance. He is obese.  Cardiovascular:     Rate and Rhythm: Normal rate.     Pulses: Normal pulses.  Pulmonary:     Effort: Pulmonary effort is normal.     Breath sounds: Normal breath sounds.  Musculoskeletal: Normal range of motion.  Skin:    General: Skin is warm and dry.  Neurological:     Mental Status: He is alert and oriented to person, place, and time.  Psychiatric:        Mood and Affect: Mood normal.        Behavior: Behavior normal.     RECENT LABS AND TESTS: BMET    Component Value Date/Time   NA 141 07/18/2018 1023   K 4.2 07/18/2018 1023   CL 99  07/18/2018 1023   CO2 24 07/18/2018 1023   GLUCOSE 135 (H) 07/18/2018 1023   GLUCOSE 149 (H) 02/22/2018 1025   BUN 12 07/18/2018 1023   CREATININE 0.72 (L) 07/18/2018 1023   CREATININE 0.82 02/22/2018 1025   CALCIUM 9.4 07/18/2018 1023   GFRNONAA 107 07/18/2018 1023   GFRNONAA 102 02/22/2018 1025   GFRAA 123 07/18/2018 1023   GFRAA 118 02/22/2018 1025   Lab Results  Component Value Date   HGBA1C 10.2 (H) 07/18/2018   HGBA1C 10.4 (H) 03/14/2018   HGBA1C 9.8 (  A) 12/13/2017   HGBA1C 10.6 (H) 03/23/2017   HGBA1C 8.5 03/30/2009   No results found for: INSULIN CBC    Component Value Date/Time   WBC 7.3 02/22/2018 1025   RBC 5.39 02/22/2018 1025   HGB 15.4 02/22/2018 1025   HCT 44.7 02/22/2018 1025   PLT 286 02/22/2018 1025   MCV 82.9 02/22/2018 1025   MCH 28.6 02/22/2018 1025   MCHC 34.5 02/22/2018 1025   RDW 12.8 02/22/2018 1025   LYMPHSABS 2,949 02/22/2018 1025   MONOABS 0.3 03/23/2017 1524   EOSABS 241 02/22/2018 1025   BASOSABS 51 02/22/2018 1025   Iron/TIBC/Ferritin/ %Sat No results found for: IRON, TIBC, FERRITIN, IRONPCTSAT Lipid Panel     Component Value Date/Time   CHOL 187 07/18/2018 1023   TRIG 216 (H) 07/18/2018 1023   HDL 41 07/18/2018 1023   CHOLHDL 4.2 02/22/2018 1025   VLDL 59 (H) 10/28/2015 0957   LDLCALC 103 (H) 07/18/2018 1023   LDLCALC 66 02/22/2018 1025   Hepatic Function Panel     Component Value Date/Time   PROT 6.6 07/18/2018 1023   ALBUMIN 4.2 07/18/2018 1023   AST 24 07/18/2018 1023   ALT 29 07/18/2018 1023   ALKPHOS 56 07/18/2018 1023   BILITOT 0.3 07/18/2018 1023     ASSESSMENT AND PLAN: Type 2 diabetes mellitus without complication, without long-term current use of insulin (HCC) - Plan: Semaglutide (RYBELSUS) 3 MG TABS  Vitamin D deficiency - Plan: Vitamin D, Ergocalciferol, (DRISDOL) 1.25 MG (50000 UT) CAPS capsule  At risk for osteoporosis  Class 1 obesity with serious comorbidity and body mass index (BMI) of 31.0 to 31.9  in adult, unspecified obesity type  PLAN:  Diabetes II Nichlos has been given extensive diabetes education by myself today including ideal fasting and post-prandial blood glucose readings, individual ideal Hgb A1c goals and hypoglycemia prevention. We discussed the importance of good blood sugar control to decrease the likelihood of diabetic complications such as nephropathy, neuropathy, limb loss, blindness, coronary artery disease, and death. We discussed the importance of intensive lifestyle modification including diet, exercise and weight loss as the first line treatment for diabetes. Pacer agrees to start Rybelsus 3 mg 1 tablet PO q AM #30 with no refills. Mannix agrees to follow up with our clinic in 2 to 3 weeks.  Vitamin D Deficiency Shawna was informed that low vitamin D levels contributes to fatigue and are associated with obesity, breast, and colon cancer. Severiano agrees to continue taking prescription Vit D @50 ,000 IU every week #4 and we will refill for 1 month. He will follow up for routine testing of vitamin D, at least 2-3 times per year. He was informed of the risk of over-replacement of vitamin D and agrees to not increase his dose unless he discusses this with [name] first. Ernan agrees to follow up with our clinic in 2 to 3 weeks.  At risk for osteopenia and osteoporosis Verlyn was given extended (15 minutes) osteoporosis prevention counseling today. Bostyn is at risk for osteopenia and osteoporsis due to his vitamin D deficiency. He was encouraged to take his vitamin D and follow his higher calcium diet and increase strengthening exercise to help strengthen his bones and decrease his risk of osteopenia and osteoporosis.  Obesity Sarah is currently in the action stage of change. As such, his goal is to continue with weight loss efforts He has agreed to follow the Category 3 plan Thomos has been instructed to work up to a goal of  150 minutes of combined cardio and strengthening exercise per  week for weight loss and overall health benefits. We discussed the following Behavioral Modification Strategies today: increasing lean protein intake, work on meal planning and easy cooking plans, and no skipping meals   Blaike has agreed to follow up with our clinic in 2 to 3 weeks. He was informed of the importance of frequent follow up visits to maximize his success with intensive lifestyle modifications for his multiple health conditions.   OBESITY BEHAVIORAL INTERVENTION VISIT  Today's visit was # 9  Starting weight: 199 lbs Starting date: 03/14/18 Today's weight : 196 lbs  Today's date: 08/08/2018 Total lbs lost to date: 3    08/08/2018  Height 5\' 6"  (1.676 m)  Weight 196 lb (88.9 kg)  BMI (Calculated) 31.65  BLOOD PRESSURE - SYSTOLIC 630  BLOOD PRESSURE - DIASTOLIC 81   Body Fat % 16.0 %  Total Body Water (lbs) 95.8 lbs     ASK: We discussed the diagnosis of obesity with [name] today and Lian agreed to give [name] permission to discuss obesity behavioral modification therapy today.  ASSESS: Starsky has the diagnosis of obesity and his BMI today is 31.65 Antwion is in the action stage of change   ADVISE: Drakkar was educated on the multiple health risks of obesity as well as the benefit of weight loss to improve his health. He was advised of the need for long term treatment and the importance of lifestyle modifications.  AGREE: Multiple dietary modification options and treatment options were discussed and  Jerrick agreed to the above obesity treatment plan.  Wilhemena Durie, am acting as transcriptionist for Abby Potash, PA-C I, Abby Potash, PA-C have reviewed above note and agree with its content

## 2018-08-09 ENCOUNTER — Encounter (INDEPENDENT_AMBULATORY_CARE_PROVIDER_SITE_OTHER): Payer: Self-pay | Admitting: Physician Assistant

## 2018-08-10 MED FILL — RYBELSUS 3 MG TABS: 3 | 30 days supply | Qty: 30 | Fill #0

## 2018-08-10 MED FILL — VIT D2 1.25 MG (50,000 UNIT: 1.25 MG | 28 days supply | Qty: 4 | Fill #0

## 2018-08-16 ENCOUNTER — Encounter (INDEPENDENT_AMBULATORY_CARE_PROVIDER_SITE_OTHER): Payer: Self-pay

## 2018-08-29 ENCOUNTER — Encounter (INDEPENDENT_AMBULATORY_CARE_PROVIDER_SITE_OTHER): Payer: Self-pay | Admitting: Physician Assistant

## 2018-08-29 ENCOUNTER — Other Ambulatory Visit: Payer: Self-pay

## 2018-08-29 ENCOUNTER — Ambulatory Visit (INDEPENDENT_AMBULATORY_CARE_PROVIDER_SITE_OTHER): Payer: 59 | Admitting: Physician Assistant

## 2018-08-29 DIAGNOSIS — E559 Vitamin D deficiency, unspecified: Secondary | ICD-10-CM | POA: Diagnosis not present

## 2018-08-29 DIAGNOSIS — Z6831 Body mass index (BMI) 31.0-31.9, adult: Secondary | ICD-10-CM | POA: Diagnosis not present

## 2018-08-29 DIAGNOSIS — E669 Obesity, unspecified: Secondary | ICD-10-CM | POA: Diagnosis not present

## 2018-08-29 DIAGNOSIS — E119 Type 2 diabetes mellitus without complications: Secondary | ICD-10-CM | POA: Diagnosis not present

## 2018-08-29 DIAGNOSIS — Z794 Long term (current) use of insulin: Secondary | ICD-10-CM | POA: Diagnosis not present

## 2018-08-29 MED ORDER — SEMAGLUTIDE 7 MG PO TABS: 7.0000 mg | ORAL_TABLET | Freq: Every day | ORAL | 0 refills | Status: DC

## 2018-08-29 MED ORDER — VITAMIN D (ERGOCALCIFEROL) 1.25 MG (50000 UNIT) PO CAPS: 50000.0000 [IU] | ORAL_CAPSULE | ORAL | 0 refills | Status: DC

## 2018-08-29 NOTE — Progress Notes (Signed)
Office: [phone]  /  Fax: [phone] TeleHealth Visit:  [name] has verbally consented to this TeleHealth visit today. The patient is located at home, the provider is located at the [facility] office. The participants in this visit include the listed provider and patient and any and all parties involved. The visit was conducted today via FaceTime.  HPI:   Chief Complaint: OBESITY [name] is here to discuss his progress with his obesity treatment plan. He is on the Category 3 plan and is following his eating plan approximately 80 % of the time. He states he is exercising 0 minutes 0 times per week. [name] reports that due to changes in his schedule at work, he has had a decrease in appetite and he has been eating out more. He has been working third shift at times. We were unable to weigh the patient today for this TeleHealth visit. He feels as if he has maintained weight since his last visit. He has lost 3 lbs since starting treatment with [name].  Diabetes II [name] has a diagnosis of diabetes type II. He is on Rybelsus, Lantus and Humalog. [name] states fasting BGs range between 140's and 290's and he denies any hypoglycemic episodes or polyphagia. Last A1c was at 10.2 He has been working on intensive lifestyle modifications including diet, exercise, and weight loss to help control his blood glucose levels.  Vitamin D deficiency [name] has a diagnosis of vitamin D deficiency. [name] is on vit D and he denies nausea, vomiting or muscle weakness.  ASSESSMENT AND PLAN:  Type 2 diabetes mellitus without complication, with long-term current use of insulin (HCC) - Plan: Semaglutide (RYBELSUS) 7 MG TABS  Vitamin D deficiency - Plan: Vitamin D, Ergocalciferol, (DRISDOL) 1.25 MG (50000 UT) CAPS capsule  Class 1 obesity with serious comorbidity and body mass index (BMI) of 31.0 to 31.9 in adult, unspecified obesity type  PLAN:  Diabetes II [name] has been given extensive  diabetes education by myself today including ideal fasting and post-prandial blood glucose readings, individual ideal Hgb A1c goals and hypoglycemia prevention. We discussed the importance of good blood sugar control to decrease the likelihood of diabetic complications such as nephropathy, neuropathy, limb loss, blindness, coronary artery disease, and death. We discussed the importance of intensive lifestyle modification including diet, exercise and weight loss as the first line treatment for diabetes. Denham agrees to increase Rybelsus to 7 mg qd #30 with no refills and follow up at the agreed upon time.  Vitamin D Deficiency [name] was informed that low vitamin D levels contributes to fatigue and are associated with obesity, breast, and colon cancer. He agrees to continue to take prescription Vit D @50 ,000 IU every week #4 with no refills and will follow up for routine testing of vitamin D, at least 2-3 times per year. He was informed of the risk of over-replacement of vitamin D and agrees to not increase his dose unless he discusses this with [name] first. [name] agrees to follow up with our clinic in 3 weeks.  Obesity [name] is currently in the action stage of change. As such, his goal is to continue with weight loss efforts He has agreed to follow the Category 3 plan [name] has been instructed to work up to a goal of 150 minutes of combined cardio and strengthening exercise per week for weight loss and overall health benefits. We discussed the following Behavioral Modification Strategies today: decrease eating out and work on meal planning and easy cooking  plans  [name] has agreed to follow up with our clinic in 3 weeks. He was informed of the importance of frequent follow up visits to maximize his success with intensive lifestyle modifications for his multiple health conditions.  ALLERGIES: Allergies  Allergen Reactions  . Pepcid [Famotidine] Other (See Comments)    Contraindicated with ODEFSEY  .  Prilosec [Omeprazole] Other (See Comments)    Contraindicated with RPV (lowers level of this ARV in ODEFSEY    MEDICATIONS: Current Outpatient Medications on File Prior to Visit  Medication Sig Dispense Refill  . aspirin 81 MG tablet Take 1 tablet (81 mg total) by mouth every evening. 30 tablet   . chlorthalidone (HYGROTON) 25 MG tablet Take 1 tablet (25 mg total) by mouth daily. 90 tablet 3  . emtricitabine-rilpivir-tenofovir AF (ODEFSEY) 200-25-25 MG TABS tablet Take 1 tablet by mouth daily with breakfast. 30 tablet 11  . ezetimibe (ZETIA) 10 MG tablet Take 1 tablet (10 mg total) by mouth daily. 90 tablet 3  . insulin glargine (LANTUS SOLOSTAR) 100 UNIT/ML injection Inject 60 Units into the skin at bedtime.    . insulin lispro (HUMALOG) 100 UNIT/ML injection Inject 10-16 Units into the skin 3 (three) times daily before meals.    [name] lisinopril (PRINIVIL,ZESTRIL) 10 MG tablet Take 1 tablet (10 mg total) by mouth daily. 90 tablet 3  . rosuvastatin (CRESTOR) 40 MG tablet Take 1 tablet (40 mg total) by mouth daily. 90 tablet 3   No current facility-administered medications on file prior to visit.     PAST MEDICAL HISTORY: Past Medical History:  Diagnosis Date  . CAD (coronary artery disease)    DES.  /  ... Later (10/2007) intervention for in-stent restenosis ;  EF 60%... catheterization... June, 2009  . Chest pain   . Diabetes mellitus type 2, uncontrolled ([place]) 09/27/2014  . Diabetes mellitus type II   . DKA (diabetic ketoacidoses) ([place]) 12/2010; 03/23/2017  . Dyslipidemia   . Ejection fraction    EF 60%, catheterization, 2009  . Gallbladder problem   . History of heart attack   . History of kidney stones   . HIV (human immunodeficiency virus infection) ([place]) dx'd ~ 2002/2003  . HLD (hyperlipidemia)   . HTN (hypertension)   . 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  . Nausea and vomiting 03/23/2017  . Secondary syphilis    treated with  Bicillin.. 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 RPR  reverted to negative  . Sinus tachycardia    mild at rest.... October, 2010  . Syphilitic hepatitis     PAST SURGICAL HISTORY: Past Surgical History:  Procedure Laterality Date  . CHOLECYSTECTOMY OPEN  07/2001  . COLONOSCOPY N/A 05/28/2017   Procedure: COLONOSCOPY;  Surgeon: Rogene Houston, MD;  [facility];  Service: Endoscopy;  Laterality: N/A;  200 - pt to prep in Endo  . CORONARY ANGIOPLASTY WITH STENT PLACEMENT  03/2006; 10/2007;   . HERNIA REPAIR    . LAPAROSCOPIC INCISIONAL / UMBILICAL / VENTRAL HERNIA REPAIR  07/2001   UHR  . POLYPECTOMY  05/28/2017   Procedure: POLYPECTOMY;  Surgeon: Rogene Houston, MD;  [facility];  Service: Endoscopy;;  colon    SOCIAL HISTORY: Social History   Tobacco Use  . Smoking status: Never Smoker  . Smokeless tobacco: Never Used  Substance Use Topics  . Alcohol use: Yes    Alcohol/week: 0.0 - 1.0 standard drinks    Comment: 03/23/2017 "  might have a drink 4-6 times/year"  . Drug use: No    FAMILY HISTORY: Family History  Problem Relation Age of Onset  . Heart attack Father        7 heart attacks  . CAD Father   . Alcoholism Father   . Hyperlipidemia Mother   . Hypertension Mother   . Anemia Other   . Diabetes Other   . CAD Sister   . Diabetes Sister     ROS: Review of Systems  Constitutional: Negative for weight loss.  Gastrointestinal: Negative for nausea and vomiting.  Musculoskeletal:       Negative for muscle weakness  Endo/Heme/Allergies:       Negative for polyphagia Negative for hypoglycemia    PHYSICAL EXAM: Pt in no acute distress  RECENT LABS AND TESTS: BMET    Component Value Date/Time   NA 141 07/18/2018 1023   K 4.2 07/18/2018 1023   CL 99 07/18/2018 1023   CO2 24 07/18/2018 1023   GLUCOSE 135 (H) 07/18/2018 1023   GLUCOSE 149 (H) 02/22/2018 1025   BUN 12 07/18/2018 1023   CREATININE 0.72 (L) 07/18/2018  1023   CREATININE 0.82 02/22/2018 1025   CALCIUM 9.4 07/18/2018 1023   GFRNONAA 107 07/18/2018 1023   GFRNONAA 102 02/22/2018 1025   GFRAA 123 07/18/2018 1023   GFRAA 118 02/22/2018 1025   Lab Results  Component Value Date   HGBA1C 10.2 (H) 07/18/2018   HGBA1C 10.4 (H) 03/14/2018   HGBA1C 9.8 (A) 12/13/2017   HGBA1C 10.6 (H) 03/23/2017   HGBA1C 8.5 03/30/2009   No results found for: INSULIN CBC    Component Value Date/Time   WBC 7.3 02/22/2018 1025   RBC 5.39 02/22/2018 1025   HGB 15.4 02/22/2018 1025   HCT 44.7 02/22/2018 1025   PLT 286 02/22/2018 1025   MCV 82.9 02/22/2018 1025   MCH 28.6 02/22/2018 1025   MCHC 34.5 02/22/2018 1025   RDW 12.8 02/22/2018 1025   LYMPHSABS 2,949 02/22/2018 1025   MONOABS 0.3 03/23/2017 1524   EOSABS 241 02/22/2018 1025   BASOSABS 51 02/22/2018 1025   Iron/TIBC/Ferritin/ %Sat No results found for: IRON, TIBC, FERRITIN, IRONPCTSAT Lipid Panel     Component Value Date/Time   CHOL 187 07/18/2018 1023   TRIG 216 (H) 07/18/2018 1023   HDL 41 07/18/2018 1023   CHOLHDL 4.2 02/22/2018 1025   VLDL 59 (H) 10/28/2015 0957   LDLCALC 103 (H) 07/18/2018 1023   LDLCALC 66 02/22/2018 1025   Hepatic Function Panel     Component Value Date/Time   PROT 6.6 07/18/2018 1023   ALBUMIN 4.2 07/18/2018 1023   AST 24 07/18/2018 1023   ALT 29 07/18/2018 1023   ALKPHOS 56 07/18/2018 1023   BILITOT 0.3 07/18/2018 1023     Results for MILES, LEYDA (MRN [id number]) as of 08/29/2018 11:38  Ref. Range 07/18/2018 10:23  Vitamin D, 25-Hydroxy Latest Ref Range: 30.0 - 100.0 ng/mL 27.3 (L)    I, Doreene Nest, am acting as transcriptionist for Abby Potash, PA-C I, Abby Potash, PA-C have reviewed above note and agree with its content

## 2018-08-30 MED FILL — ODEFSEY 200-25-25 MG TABS: 200-25-25 | 30 days supply | Qty: 30 | Fill #7

## 2018-09-12 ENCOUNTER — Other Ambulatory Visit: Payer: 59

## 2018-09-13 MED FILL — HUMALOG 100 UNITS/ML KWIKPE: 100 | 90 days supply | Qty: 90 | Fill #0

## 2018-09-13 MED FILL — RYBELSUS 7 MG TABS: 7 | 30 days supply | Qty: 30 | Fill #0

## 2018-09-13 MED FILL — LANTUS SOLOSTAR 100 UNITS/M: 100 | 56 days supply | Qty: 60 | Fill #2

## 2018-09-19 ENCOUNTER — Encounter (INDEPENDENT_AMBULATORY_CARE_PROVIDER_SITE_OTHER): Payer: Self-pay | Admitting: Physician Assistant

## 2018-09-19 ENCOUNTER — Ambulatory Visit (INDEPENDENT_AMBULATORY_CARE_PROVIDER_SITE_OTHER): Payer: 59 | Admitting: Physician Assistant

## 2018-09-19 ENCOUNTER — Other Ambulatory Visit: Payer: Self-pay

## 2018-09-19 DIAGNOSIS — Z6831 Body mass index (BMI) 31.0-31.9, adult: Secondary | ICD-10-CM | POA: Diagnosis not present

## 2018-09-19 DIAGNOSIS — E7849 Other hyperlipidemia: Secondary | ICD-10-CM | POA: Diagnosis not present

## 2018-09-19 DIAGNOSIS — E669 Obesity, unspecified: Secondary | ICD-10-CM

## 2018-09-19 DIAGNOSIS — Z794 Long term (current) use of insulin: Secondary | ICD-10-CM

## 2018-09-19 DIAGNOSIS — E119 Type 2 diabetes mellitus without complications: Secondary | ICD-10-CM | POA: Diagnosis not present

## 2018-09-19 MED ORDER — SEMAGLUTIDE 14 MG PO TABS: 14.0000 mg | ORAL_TABLET | Freq: Every day | ORAL | 0 refills | Status: DC

## 2018-09-19 NOTE — Progress Notes (Signed)
Office: [phone]  /  Fax: [phone] TeleHealth Visit:  [name] has verbally consented to this TeleHealth visit today. The patient is located at home, the provider is located at the [employer] [facility] office. The participants in this visit include the listed provider and patient. The visit was conducted today via Webex.  HPI:   Chief Complaint: OBESITY [name] is here to discuss his progress with his obesity treatment plan. He is on the Category 3 plan and is following his eating plan approximately 40% of the time. He states he is walking 30-40 minutes 2-3 times per week. [name] reports that he has not been eating on plan. He has not been to the grocery store because he is scared to go out due to the pandemic.  We were unable to weigh the patient today for this TeleHealth visit. He feels as if he has maintained his weight since his last visit. He has lost 3 lbs since starting treatment with [name].  Diabetes II [name] has a diagnosis of diabetes type II and is on Rybelsus, Lantus, and Humalog. [name] does not report checking his blood sugars. Last A1c was 10.2 on 07/18/2018. He has been working on intensive lifestyle modifications including diet, exercise, and weight loss to help control his blood glucose levels.  Hyperlipidemia [name] has hyperlipidemia and has been trying to improve his cholesterol levels with intensive lifestyle modification including a low saturated fat diet, exercise and weight loss. He is on Zetia and Crestor and denies any chest pain.  ASSESSMENT AND PLAN:  Other hyperlipidemia  Type 2 diabetes mellitus without complication, with long-term current use of insulin (HCC) - Plan: Semaglutide (RYBELSUS) 14 MG TABS  Class 1 obesity with serious comorbidity and body mass index (BMI) of 31.0 to 31.9 in adult, unspecified obesity type  PLAN:  Diabetes II [name] has been given extensive diabetes education by myself today including ideal fasting and post-prandial  blood glucose readings, individual ideal HgA1c goals  and hypoglycemia prevention. We discussed the importance of good blood sugar control to decrease the likelihood of diabetic complications such as nephropathy, neuropathy, limb loss, blindness, coronary artery disease, and death. We discussed the importance of intensive lifestyle modification including diet, exercise and weight loss as the first line treatment for diabetes. [name] was advised to increase his Rybelsus to 14 mg a day #30 with 0 refills. He agrees to follow-up with our clinic in 2 weeks.   Hyperlipidemia [name] was informed of the American Heart Association Guidelines emphasizing intensive lifestyle modifications as the first line treatment for hyperlipidemia. We discussed many lifestyle modifications today in depth, and Jennifer will continue to work on decreasing saturated fats such as fatty red meat, butter and many fried foods. He will continue his medications, increase vegetables and lean protein in his diet, and continue to work on exercise and weight loss efforts.  Obesity [name] is currently in the action stage of change. As such, his goal is to continue with weight loss efforts. He has agreed to follow the Category 3 plan. [name] has been instructed to work up to a goal of 150 minutes of combined cardio and strengthening exercise per week for weight loss and overall health benefits. We discussed the following Behavioral Modification Strategies today: increasing lean protein intake, work on meal planning and easy cooking plans.  [name] has agreed to follow-up with our clinic in 2 weeks. He was informed of the importance of frequent follow-up visits to maximize his success with intensive lifestyle  modifications for his multiple health conditions.  ALLERGIES: Allergies  Allergen Reactions  . Pepcid [Famotidine] Other (See Comments)    Contraindicated with ODEFSEY  . Prilosec [Omeprazole] Other (See Comments)    Contraindicated with  RPV (lowers level of this ARV in ODEFSEY    MEDICATIONS: Current Outpatient Medications on File Prior to Visit  Medication Sig Dispense Refill  . aspirin 81 MG tablet Take 1 tablet (81 mg total) by mouth every evening. 30 tablet   . chlorthalidone (HYGROTON) 25 MG tablet Take 1 tablet (25 mg total) by mouth daily. 90 tablet 3  . emtricitabine-rilpivir-tenofovir AF (ODEFSEY) 200-25-25 MG TABS tablet Take 1 tablet by mouth daily with breakfast. 30 tablet 11  . ezetimibe (ZETIA) 10 MG tablet Take 1 tablet (10 mg total) by mouth daily. 90 tablet 3  . insulin glargine (LANTUS SOLOSTAR) 100 UNIT/ML injection Inject 60 Units into the skin at bedtime.    . insulin lispro (HUMALOG) 100 UNIT/ML injection Inject 10-16 Units into the skin 3 (three) times daily before meals.    [name] lisinopril (PRINIVIL,ZESTRIL) 10 MG tablet Take 1 tablet (10 mg total) by mouth daily. 90 tablet 3  . rosuvastatin (CRESTOR) 40 MG tablet Take 1 tablet (40 mg total) by mouth daily. 90 tablet 3  . Vitamin D, Ergocalciferol, (DRISDOL) 1.25 MG (50000 UT) CAPS capsule Take 1 capsule (50,000 Units total) by mouth every 7 (seven) days. 4 capsule 0   No current facility-administered medications on file prior to visit.     PAST MEDICAL HISTORY: Past Medical History:  Diagnosis Date  . CAD (coronary artery disease)    DES.  /  ... Later (10/2007) intervention for in-stent restenosis ;  EF 60%... catheterization... June, 2009  . Chest pain   . Diabetes mellitus type 2, uncontrolled ([place]) 09/27/2014  . Diabetes mellitus type II   . DKA (diabetic ketoacidoses) ([place]) 12/2010; 03/23/2017  . Dyslipidemia   . Ejection fraction    EF 60%, catheterization, 2009  . Gallbladder problem   . History of heart attack   . History of kidney stones   . HIV (human immunodeficiency virus infection) ([place]) dx'd ~ 2002/2003  . HLD (hyperlipidemia)   . HTN (hypertension)   . 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  . Nausea and vomiting 03/23/2017  . 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 RPR  reverted to negative  . Sinus tachycardia    mild at rest.... October, 2010  . Syphilitic hepatitis     PAST SURGICAL HISTORY: Past Surgical History:  Procedure Laterality Date  . CHOLECYSTECTOMY OPEN  07/2001  . COLONOSCOPY N/A 05/28/2017   Procedure: COLONOSCOPY;  Surgeon: Rogene Houston, MD;  [facility];  Service: Endoscopy;  Laterality: N/A;  200 - pt to prep in Endo  . CORONARY ANGIOPLASTY WITH STENT PLACEMENT  03/2006; 10/2007;   . HERNIA REPAIR    . LAPAROSCOPIC INCISIONAL / UMBILICAL / VENTRAL HERNIA REPAIR  07/2001   UHR  . POLYPECTOMY  05/28/2017   Procedure: POLYPECTOMY;  Surgeon: Rogene Houston, MD;  Location: [facility];  Service: Endoscopy;;  colon    SOCIAL HISTORY: Social History   Tobacco Use  . Smoking status: Never Smoker  . Smokeless tobacco: Never Used  Substance Use Topics  . Alcohol use: Yes    Alcohol/week: 0.0 - 1.0 standard drinks    Comment: 03/23/2017 "might have a drink 4-6  times/year"  . Drug use: No    FAMILY HISTORY: Family History  Problem Relation Age of Onset  . Heart attack Father        7 heart attacks  . CAD Father   . Alcoholism Father   . Hyperlipidemia Mother   . Hypertension Mother   . Anemia Other   . Diabetes Other   . CAD Sister   . Diabetes Sister    ROS: Review of Systems  Cardiovascular: Negative for chest pain.   PHYSICAL EXAM: Pt in no acute distress  RECENT LABS AND TESTS: BMET    Component Value Date/Time   NA 141 07/18/2018 1023   K 4.2 07/18/2018 1023   CL 99 07/18/2018 1023   CO2 24 07/18/2018 1023   GLUCOSE 135 (H) 07/18/2018 1023   GLUCOSE 149 (H) 02/22/2018 1025   BUN 12 07/18/2018 1023   CREATININE 0.72 (L) 07/18/2018 1023   CREATININE 0.82 02/22/2018 1025   CALCIUM 9.4 07/18/2018 1023   GFRNONAA 107 07/18/2018 1023    GFRNONAA 102 02/22/2018 1025   GFRAA 123 07/18/2018 1023   GFRAA 118 02/22/2018 1025   Lab Results  Component Value Date   HGBA1C 10.2 (H) 07/18/2018   HGBA1C 10.4 (H) 03/14/2018   HGBA1C 9.8 (A) 12/13/2017   HGBA1C 10.6 (H) 03/23/2017   HGBA1C 8.5 03/30/2009   No results found for: INSULIN CBC    Component Value Date/Time   WBC 7.3 02/22/2018 1025   RBC 5.39 02/22/2018 1025   HGB 15.4 02/22/2018 1025   HCT 44.7 02/22/2018 1025   PLT 286 02/22/2018 1025   MCV 82.9 02/22/2018 1025   MCH 28.6 02/22/2018 1025   MCHC 34.5 02/22/2018 1025   RDW 12.8 02/22/2018 1025   LYMPHSABS 2,949 02/22/2018 1025   MONOABS 0.3 03/23/2017 1524   EOSABS 241 02/22/2018 1025   BASOSABS 51 02/22/2018 1025   Iron/TIBC/Ferritin/ %Sat No results found for: IRON, TIBC, FERRITIN, IRONPCTSAT Lipid Panel     Component Value Date/Time   CHOL 187 07/18/2018 1023   TRIG 216 (H) 07/18/2018 1023   HDL 41 07/18/2018 1023   CHOLHDL 4.2 02/22/2018 1025   VLDL 59 (H) 10/28/2015 0957   LDLCALC 103 (H) 07/18/2018 1023   LDLCALC 66 02/22/2018 1025   Hepatic Function Panel     Component Value Date/Time   PROT 6.6 07/18/2018 1023   ALBUMIN 4.2 07/18/2018 1023   AST 24 07/18/2018 1023   ALT 29 07/18/2018 1023   ALKPHOS 56 07/18/2018 1023   BILITOT 0.3 07/18/2018 1023    Results for MITHCELL, SCHUMPERT (MRN [id number]) as of 09/19/2018 13:31  Ref. Range 07/18/2018 10:23  Vitamin D, 25-Hydroxy Latest Ref Range: 30.0 - 100.0 ng/mL 27.3 (L)   I, Michaelene Song, am acting as Location manager for [employer], PA-C I, Abby Potash, PA-C have reviewed above note and agree with its content

## 2018-09-27 MED FILL — ODEFSEY 200-25-25 MG TABS: 200-25-25 | 30 days supply | Qty: 30 | Fill #8

## 2018-09-27 MED FILL — RYBELSUS 14 MG TABS: 14 | 30 days supply | Qty: 30 | Fill #0

## 2018-10-04 ENCOUNTER — Ambulatory Visit (INDEPENDENT_AMBULATORY_CARE_PROVIDER_SITE_OTHER): Payer: 59 | Admitting: Physician Assistant

## 2018-10-04 ENCOUNTER — Other Ambulatory Visit: Payer: Self-pay

## 2018-10-04 DIAGNOSIS — E669 Obesity, unspecified: Secondary | ICD-10-CM | POA: Diagnosis not present

## 2018-10-04 DIAGNOSIS — Z6831 Body mass index (BMI) 31.0-31.9, adult: Secondary | ICD-10-CM | POA: Diagnosis not present

## 2018-10-04 DIAGNOSIS — Z794 Long term (current) use of insulin: Secondary | ICD-10-CM | POA: Diagnosis not present

## 2018-10-04 DIAGNOSIS — E559 Vitamin D deficiency, unspecified: Secondary | ICD-10-CM

## 2018-10-04 DIAGNOSIS — E119 Type 2 diabetes mellitus without complications: Secondary | ICD-10-CM

## 2018-10-04 MED ORDER — EMPAGLIFLOZIN 10 MG PO TABS: 10.0000 mg | ORAL_TABLET | Freq: Every day | ORAL | 0 refills | Status: DC

## 2018-10-04 MED ORDER — VITAMIN D (ERGOCALCIFEROL) 1.25 MG (50000 UNIT) PO CAPS: 50000.0000 [IU] | ORAL_CAPSULE | ORAL | 0 refills | Status: DC

## 2018-10-04 MED FILL — JARDIANCE 10 MG TABLET: 10 | 30 days supply | Qty: 30 | Fill #0

## 2018-10-04 MED FILL — VIT D2 1.25 MG (50,000 UNIT: 1.25 MG | 28 days supply | Qty: 4 | Fill #0

## 2018-10-05 NOTE — Progress Notes (Signed)
Office: [phone]  /  Fax: [phone] TeleHealth Visit:  [name] has verbally consented to this TeleHealth visit today. The patient is located at home, the provider is located at the [employer] [facility] office. The participants in this visit include the listed provider and patient. The visit was conducted today via Webex.  HPI:   Chief Complaint: OBESITY [name] is here to discuss his progress with his obesity treatment plan. He is on the Category 3 plan and is following his eating plan approximately 40-50% of the time. [place] he is walking 30-40 minutes 2 times per week. [name] reports that he continues to only be able to eat a few bites at each meal and has to stop due to lack of appetite. We were unable to weigh the patient today for this TeleHealth visit. He feels as if he has maintained his weight since his last visit. He has lost 3 lbs since starting treatment with [name].  Vitamin D deficiency [name] has a diagnosis of Vitamin D deficiency. He is currently taking prescription Vit D and denies nausea, vomiting or muscle weakness.  Diabetes II [name] has a diagnosis of diabetes type II and is on Lantus, Humalog, and Rybelsus. [name] states fasting blood sugars range between 100 and 147. Kache states he has no appetite and reports he is no longer seeing Endocrinology because he felt he was not doing anything to help him. Last A1c was 10.2 on 07/18/2018. He has been working on intensive lifestyle modifications including diet, exercise, and weight loss to help control his blood glucose levels.  ASSESSMENT AND PLAN:  Vitamin D deficiency - Plan: Vitamin D, Ergocalciferol, (DRISDOL) 1.25 MG (50000 UT) CAPS capsule  Type 2 diabetes mellitus without complication, with long-term current use of insulin (HCC) - Plan: empagliflozin (JARDIANCE) 10 MG TABS tablet  Class 1 obesity with serious comorbidity and body mass index (BMI) of 31.0 to 31.9 in adult, unspecified obesity type   PLAN:  Vitamin D Deficiency [name] was informed that low Vitamin D levels contributes to fatigue and are associated with obesity, breast, and colon cancer. He agrees to continue to take prescription Vit D @ 50,000 IU every week #4 with 0 refills and will follow-up for routine testing of Vitamin D, at least 2-3 times per year. He was informed of the risk of over-replacement of Vitamin D and agrees to not increase his dose unless he discusses this with [name] first. [name] agrees to follow-up with our clinic in 3 weeks.  Diabetes II [name] has been given extensive diabetes education by myself today including ideal fasting and post-prandial blood glucose readings, individual ideal HgA1c goals  and hypoglycemia prevention. We discussed the importance of good blood sugar control to decrease the likelihood of diabetic complications such as nephropathy, neuropathy, limb loss, blindness, coronary artery disease, and death. We discussed the importance of intensive lifestyle modification including diet, exercise and weight loss as the first line treatment for diabetes. [name] will add Jardiance 10 mg 1 PO daily #30 with 0 refills and agrees to follow-up with our clinic in 3 weeks. He was instructed to discontinue Rybelsus.  Obesity [name] is currently in the action stage of change. As such, his goal is to continue with weight loss efforts. He has agreed to follow the Category 3 plan. [name] has been instructed to work up to a goal of 150 minutes of combined cardio and strengthening exercise per week for weight loss and overall health benefits. We discussed the following Behavioral  Modification Strategies today: work on meal planning and easy cooking plans.  [name] has agreed to follow-up with our clinic in 3 weeks. He was informed of the importance of frequent follow-up visits to maximize his success with intensive lifestyle modifications for his multiple health conditions.  ALLERGIES: Allergies  Allergen Reactions   . Pepcid [Famotidine] Other (See Comments)    Contraindicated with ODEFSEY  . Prilosec [Omeprazole] Other (See Comments)    Contraindicated with RPV (lowers level of this ARV in ODEFSEY    MEDICATIONS: Current Outpatient Medications on File Prior to Visit  Medication Sig Dispense Refill  . aspirin 81 MG tablet Take 1 tablet (81 mg total) by mouth every evening. 30 tablet   . chlorthalidone (HYGROTON) 25 MG tablet Take 1 tablet (25 mg total) by mouth daily. 90 tablet 3  . emtricitabine-rilpivir-tenofovir AF (ODEFSEY) 200-25-25 MG TABS tablet Take 1 tablet by mouth daily with breakfast. 30 tablet 11  . ezetimibe (ZETIA) 10 MG tablet Take 1 tablet (10 mg total) by mouth daily. 90 tablet 3  . insulin glargine (LANTUS SOLOSTAR) 100 UNIT/ML injection Inject 45 Units into the skin at bedtime.     . insulin lispro (HUMALOG) 100 UNIT/ML injection Inject 10-16 Units into the skin 3 (three) times daily before meals.    [name] lisinopril (PRINIVIL,ZESTRIL) 10 MG tablet Take 1 tablet (10 mg total) by mouth daily. 90 tablet 3  . rosuvastatin (CRESTOR) 40 MG tablet Take 1 tablet (40 mg total) by mouth daily. 90 tablet 3   No current facility-administered medications on file prior to visit.     PAST MEDICAL HISTORY: Past Medical History:  Diagnosis Date  . CAD (coronary artery disease)    DES.  /  ... Later (10/2007) intervention for in-stent restenosis ;  EF 60%... catheterization... June, 2009  . Chest pain   . Diabetes mellitus type 2, uncontrolled ([place]) 09/27/2014  . Diabetes mellitus type II   . DKA (diabetic ketoacidoses) ([place]) 12/2010; 03/23/2017  . Dyslipidemia   . Ejection fraction    EF 60%, catheterization, 2009  . Gallbladder problem   . History of heart attack   . History of kidney stones   . HIV (human immunodeficiency virus infection) ([place]) dx'd ~ 2002/2003  . HLD (hyperlipidemia)   . HTN (hypertension)   . 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  . Nausea and vomiting 03/23/2017  . 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 RPR  reverted to negative  . Sinus tachycardia    mild at rest.... October, 2010  . Syphilitic hepatitis     PAST SURGICAL HISTORY: Past Surgical History:  Procedure Laterality Date  . CHOLECYSTECTOMY OPEN  07/2001  . COLONOSCOPY N/A 05/28/2017   Procedure: COLONOSCOPY;  Surgeon: Rogene Houston, MD;  [facility];  Service: Endoscopy;  Laterality: N/A;  200 - pt to prep in Endo  . CORONARY ANGIOPLASTY WITH STENT PLACEMENT  03/2006; 10/2007;   . HERNIA REPAIR    . LAPAROSCOPIC INCISIONAL / UMBILICAL / VENTRAL HERNIA REPAIR  07/2001   UHR  . POLYPECTOMY  05/28/2017   Procedure: POLYPECTOMY;  Surgeon: Rogene Houston, MD;  [facility];  Service: Endoscopy;;  colon    SOCIAL HISTORY: Social History   Tobacco Use  . Smoking status: Never Smoker  . Smokeless tobacco: Never Used  Substance Use Topics  . Alcohol use: Yes    Alcohol/week: 0.0 -  1.0 standard drinks    Comment: 03/23/2017 "might have a drink 4-6 times/year"  . Drug use: No    FAMILY HISTORY: Family History  Problem Relation Age of Onset  . Heart attack Father        7 heart attacks  . CAD Father   . Alcoholism Father   . Hyperlipidemia Mother   . Hypertension Mother   . Anemia Other   . Diabetes Other   . CAD Sister   . Diabetes Sister    ROS: Review of Systems  Gastrointestinal: Negative for nausea and vomiting.  Musculoskeletal:       Negative for muscle weakness.   PHYSICAL EXAM: Pt in no acute distress  RECENT LABS AND TESTS: BMET    Component Value Date/Time   NA 141 07/18/2018 1023   K 4.2 07/18/2018 1023   CL 99 07/18/2018 1023   CO2 24 07/18/2018 1023   GLUCOSE 135 (H) 07/18/2018 1023   GLUCOSE 149 (H) 02/22/2018 1025   BUN 12 07/18/2018 1023   CREATININE 0.72 (L) 07/18/2018 1023   CREATININE 0.82 02/22/2018 1025    CALCIUM 9.4 07/18/2018 1023   GFRNONAA 107 07/18/2018 1023   GFRNONAA 102 02/22/2018 1025   GFRAA 123 07/18/2018 1023   GFRAA 118 02/22/2018 1025   Lab Results  Component Value Date   HGBA1C 10.2 (H) 07/18/2018   HGBA1C 10.4 (H) 03/14/2018   HGBA1C 9.8 (A) 12/13/2017   HGBA1C 10.6 (H) 03/23/2017   HGBA1C 8.5 03/30/2009   No results found for: INSULIN CBC    Component Value Date/Time   WBC 7.3 02/22/2018 1025   RBC 5.39 02/22/2018 1025   HGB 15.4 02/22/2018 1025   HCT 44.7 02/22/2018 1025   PLT 286 02/22/2018 1025   MCV 82.9 02/22/2018 1025   MCH 28.6 02/22/2018 1025   MCHC 34.5 02/22/2018 1025   RDW 12.8 02/22/2018 1025   LYMPHSABS 2,949 02/22/2018 1025   MONOABS 0.3 03/23/2017 1524   EOSABS 241 02/22/2018 1025   BASOSABS 51 02/22/2018 1025   Iron/TIBC/Ferritin/ %Sat No results found for: IRON, TIBC, FERRITIN, IRONPCTSAT Lipid Panel     Component Value Date/Time   CHOL 187 07/18/2018 1023   TRIG 216 (H) 07/18/2018 1023   HDL 41 07/18/2018 1023   CHOLHDL 4.2 02/22/2018 1025   VLDL 59 (H) 10/28/2015 0957   LDLCALC 103 (H) 07/18/2018 1023   LDLCALC 66 02/22/2018 1025   Hepatic Function Panel     Component Value Date/Time   PROT 6.6 07/18/2018 1023   ALBUMIN 4.2 07/18/2018 1023   AST 24 07/18/2018 1023   ALT 29 07/18/2018 1023   ALKPHOS 56 07/18/2018 1023   BILITOT 0.3 07/18/2018 1023    Results for DAMOND, BORCHERS (MRN [id number]) as of 10/05/2018 10:24  Ref. Range 07/18/2018 10:23  Vitamin D, 25-Hydroxy Latest Ref Range: 30.0 - 100.0 ng/mL 27.3 (L)    I, Michaelene Song, am acting as Location manager for [employer], PA-C I, Abby Potash, PA-C have reviewed above note and agree with its content

## 2018-10-18 DIAGNOSIS — H524 Presbyopia: Secondary | ICD-10-CM | POA: Diagnosis not present

## 2018-10-18 MED FILL — METOPROLOL SUCCINATE ER 100: 100 | 90 days supply | Qty: 90 | Fill #1

## 2018-10-21 MED FILL — ODEFSEY 200-25-25 MG TABS: 200-25-25 | 30 days supply | Qty: 30 | Fill #9

## 2018-10-21 MED FILL — FREESTYLE LIBRE 14 DAY SENS: 28 days supply | Qty: 2 | Fill #0

## 2018-10-24 ENCOUNTER — Other Ambulatory Visit: Payer: Self-pay

## 2018-10-24 ENCOUNTER — Encounter (INDEPENDENT_AMBULATORY_CARE_PROVIDER_SITE_OTHER): Payer: Self-pay | Admitting: Physician Assistant

## 2018-10-24 ENCOUNTER — Ambulatory Visit (INDEPENDENT_AMBULATORY_CARE_PROVIDER_SITE_OTHER): Payer: 59 | Admitting: Physician Assistant

## 2018-10-24 DIAGNOSIS — E669 Obesity, unspecified: Secondary | ICD-10-CM | POA: Diagnosis not present

## 2018-10-24 DIAGNOSIS — E559 Vitamin D deficiency, unspecified: Secondary | ICD-10-CM

## 2018-10-24 DIAGNOSIS — Z6831 Body mass index (BMI) 31.0-31.9, adult: Secondary | ICD-10-CM | POA: Diagnosis not present

## 2018-10-24 DIAGNOSIS — E119 Type 2 diabetes mellitus without complications: Secondary | ICD-10-CM

## 2018-10-24 DIAGNOSIS — Z794 Long term (current) use of insulin: Secondary | ICD-10-CM | POA: Diagnosis not present

## 2018-10-24 MED ORDER — VITAMIN D (ERGOCALCIFEROL) 1.25 MG (50000 UNIT) PO CAPS: 50000.0000 [IU] | ORAL_CAPSULE | ORAL | 0 refills | Status: DC

## 2018-10-24 NOTE — Progress Notes (Signed)
Office: [phone]  /  Fax: [phone] TeleHealth Visit:  [name] has verbally consented to this TeleHealth visit today. The patient is located at home, the provider is located at the [employer] [facility] office. The participants in this visit include the listed provider and patient. The visit was conducted today via face time.  HPI:   Chief Complaint: OBESITY [name] is here to discuss his progress with his obesity treatment plan. He is on the Category 3 plan and is following his eating plan approximately 50 % of the time. He states he is walking for 30 minutes 3 times per week. [name] reports that he recently had to put his dog to sleep and he has been depressed, and therefore not eating much.  We were unable to weigh the patient today for this TeleHealth visit. He is unsure if he has lost or gained weight since his last visit. He has lost 3 lbs since starting treatment with [name].  Diabetes II [name] has a diagnosis of diabetes type II. [name] reports he cannot remember all of his BGs reading, but this morning it was 153. his appetite has improved off of Rybelsus and he states his blood sugars are better on Jardiance. He denies nausea, vomiting, or diarrhea. Last A1c was 10.2. He denies hypoglycemia. He has been working on intensive lifestyle modifications including diet, exercise, and weight loss to help control his blood glucose levels.  Vitamin D Deficiency [name] has a diagnosis of vitamin D deficiency. He is currently taking prescription Vit D. He denies nausea, vomiting or muscle weakness.  ASSESSMENT AND PLAN:  Vitamin D deficiency - Plan: Vitamin D, Ergocalciferol, (DRISDOL) 1.25 MG (50000 UT) CAPS capsule  Type 2 diabetes mellitus without complication, with long-term current use of insulin (HCC)  Class 1 obesity with serious comorbidity and body mass index (BMI) of 31.0 to 31.9 in adult, unspecified obesity type  PLAN:  Diabetes II [name] has been given extensive  diabetes education by myself today including ideal fasting and post-prandial blood glucose readings, individual ideal Hgb A1c goals and hypoglycemia prevention. We discussed the importance of good blood sugar control to decrease the likelihood of diabetic complications such as nephropathy, neuropathy, limb loss, blindness, coronary artery disease, and death. We discussed the importance of intensive lifestyle modification including diet, exercise and weight loss as the first line treatment for diabetes. Jamar agrees to continue his diabetes medications and will follow up at the agreed upon time.  Vitamin D Deficiency [name] was informed that low vitamin D levels contributes to fatigue and are associated with obesity, breast, and colon cancer. Cleotis agrees to continue taking prescription Vit D @50 ,000 IU every week #4 and we will refill for 1 month. He will follow up for routine testing of vitamin D, at least 2-3 times per year. He was informed of the risk of over-replacement of vitamin D and agrees to not increase his dose unless he discusses this with [name] first. [name] agrees to follow up with our clinic in 2 weeks.  Obesity [name] is currently in the action stage of change. As such, his goal is to continue with weight loss efforts He has agreed to follow the Category 3 plan [name] has been instructed to work up to a goal of 150 minutes of combined cardio and strengthening exercise per week for weight loss and overall health benefits. We discussed the following Behavioral Modification Strategies today: increasing lean protein intake and no skipping meals   [name] has agreed to  follow up with our clinic in 2 weeks. He was informed of the importance of frequent follow up visits to maximize his success with intensive lifestyle modifications for his multiple health conditions.  ALLERGIES: Allergies  Allergen Reactions  . Pepcid [Famotidine] Other (See Comments)    Contraindicated with ODEFSEY  . Prilosec  [Omeprazole] Other (See Comments)    Contraindicated with RPV (lowers level of this ARV in ODEFSEY    MEDICATIONS: Current Outpatient Medications on File Prior to Visit  Medication Sig Dispense Refill  . aspirin 81 MG tablet Take 1 tablet (81 mg total) by mouth every evening. 30 tablet   . chlorthalidone (HYGROTON) 25 MG tablet Take 1 tablet (25 mg total) by mouth daily. 90 tablet 3  . empagliflozin (JARDIANCE) 10 MG TABS tablet Take 10 mg by mouth daily. 30 tablet 0  . emtricitabine-rilpivir-tenofovir AF (ODEFSEY) 200-25-25 MG TABS tablet Take 1 tablet by mouth daily with breakfast. 30 tablet 11  . ezetimibe (ZETIA) 10 MG tablet Take 1 tablet (10 mg total) by mouth daily. 90 tablet 3  . insulin glargine (LANTUS SOLOSTAR) 100 UNIT/ML injection Inject 45 Units into the skin at bedtime.     . insulin lispro (HUMALOG) 100 UNIT/ML injection Inject 10-16 Units into the skin 3 (three) times daily before meals.    [name] lisinopril (PRINIVIL,ZESTRIL) 10 MG tablet Take 1 tablet (10 mg total) by mouth daily. 90 tablet 3  . rosuvastatin (CRESTOR) 40 MG tablet Take 1 tablet (40 mg total) by mouth daily. 90 tablet 3   No current facility-administered medications on file prior to visit.     PAST MEDICAL HISTORY: Past Medical History:  Diagnosis Date  . CAD (coronary artery disease)    DES.  /  ... Later (10/2007) intervention for in-stent restenosis ;  EF 60%... catheterization... June, 2009  . Chest pain   . Diabetes mellitus type 2, uncontrolled ([employer]) 09/27/2014  . Diabetes mellitus type II   . DKA (diabetic ketoacidoses) ([place]) 12/2010; 03/23/2017  . Dyslipidemia   . Ejection fraction    EF 60%, catheterization, 2009  . Gallbladder problem   . History of heart attack   . History of kidney stones   . HIV (human immunodeficiency virus infection) ([place]) dx'd ~ 2002/2003  . HLD (hyperlipidemia)   . HTN (hypertension)   . 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  . Nausea and vomiting 03/23/2017  . 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 RPR  reverted to negative  . Sinus tachycardia    mild at rest.... October, 2010  . Syphilitic hepatitis     PAST SURGICAL HISTORY: Past Surgical History:  Procedure Laterality Date  . CHOLECYSTECTOMY OPEN  07/2001  . COLONOSCOPY N/A 05/28/2017   Procedure: COLONOSCOPY;  Surgeon: Rogene Houston, MD;  [facility];  Service: Endoscopy;  Laterality: N/A;  200 - pt to prep in Endo  . CORONARY ANGIOPLASTY WITH STENT PLACEMENT  03/2006; 10/2007;   . HERNIA REPAIR    . LAPAROSCOPIC INCISIONAL / UMBILICAL / VENTRAL HERNIA REPAIR  07/2001   UHR  . POLYPECTOMY  05/28/2017   Procedure: POLYPECTOMY;  Surgeon: Rogene Houston, MD;  Location[facility];  Service: Endoscopy;;  colon    SOCIAL HISTORY: Social History   Tobacco Use  . Smoking status: Never Smoker  . Smokeless tobacco: Never Used  Substance Use Topics  . Alcohol use: Yes  Alcohol/week: 0.0 - 1.0 standard drinks    Comment: 03/23/2017 "might have a drink 4-6 times/year"  . Drug use: No    FAMILY HISTORY: Family History  Problem Relation Age of Onset  . Heart attack Father        7 heart attacks  . CAD Father   . Alcoholism Father   . Hyperlipidemia Mother   . Hypertension Mother   . Anemia Other   . Diabetes Other   . CAD Sister   . Diabetes Sister     ROS: Review of Systems  Constitutional: Negative for weight loss.  Gastrointestinal: Negative for diarrhea, nausea and vomiting.  Musculoskeletal:       Negative muscle weakness  Endo/Heme/Allergies:       Negative hypoglycemia    PHYSICAL EXAM: Pt in no acute distress  RECENT LABS AND TESTS: BMET    Component Value Date/Time   NA 141 07/18/2018 1023   K 4.2 07/18/2018 1023   CL 99 07/18/2018 1023   CO2 24 07/18/2018 1023   GLUCOSE 135 (H) 07/18/2018 1023   GLUCOSE 149 (H) 02/22/2018 1025    BUN 12 07/18/2018 1023   CREATININE 0.72 (L) 07/18/2018 1023   CREATININE 0.82 02/22/2018 1025   CALCIUM 9.4 07/18/2018 1023   GFRNONAA 107 07/18/2018 1023   GFRNONAA 102 02/22/2018 1025   GFRAA 123 07/18/2018 1023   GFRAA 118 02/22/2018 1025   Lab Results  Component Value Date   HGBA1C 10.2 (H) 07/18/2018   HGBA1C 10.4 (H) 03/14/2018   HGBA1C 9.8 (A) 12/13/2017   HGBA1C 10.6 (H) 03/23/2017   HGBA1C 8.5 03/30/2009   No results found for: INSULIN CBC    Component Value Date/Time   WBC 7.3 02/22/2018 1025   RBC 5.39 02/22/2018 1025   HGB 15.4 02/22/2018 1025   HCT 44.7 02/22/2018 1025   PLT 286 02/22/2018 1025   MCV 82.9 02/22/2018 1025   MCH 28.6 02/22/2018 1025   MCHC 34.5 02/22/2018 1025   RDW 12.8 02/22/2018 1025   LYMPHSABS 2,949 02/22/2018 1025   MONOABS 0.3 03/23/2017 1524   EOSABS 241 02/22/2018 1025   BASOSABS 51 02/22/2018 1025   Iron/TIBC/Ferritin/ %Sat No results found for: IRON, TIBC, FERRITIN, IRONPCTSAT Lipid Panel     Component Value Date/Time   CHOL 187 07/18/2018 1023   TRIG 216 (H) 07/18/2018 1023   HDL 41 07/18/2018 1023   CHOLHDL 4.2 02/22/2018 1025   VLDL 59 (H) 10/28/2015 0957   LDLCALC 103 (H) 07/18/2018 1023   LDLCALC 66 02/22/2018 1025   Hepatic Function Panel     Component Value Date/Time   PROT 6.6 07/18/2018 1023   ALBUMIN 4.2 07/18/2018 1023   AST 24 07/18/2018 1023   ALT 29 07/18/2018 1023   ALKPHOS 56 07/18/2018 1023   BILITOT 0.3 07/18/2018 1023       I, Trixie Dredge, am acting as Location manager for Abby Potash, PA-C I, Abby Potash, PA-C have reviewed above note and agree with its content

## 2018-10-26 MED FILL — VIT D2 1.25 MG (50,000 UNIT: 1.25 MG | 28 days supply | Qty: 4 | Fill #0

## 2018-11-07 ENCOUNTER — Encounter (INDEPENDENT_AMBULATORY_CARE_PROVIDER_SITE_OTHER): Payer: Self-pay | Admitting: Physician Assistant

## 2018-11-07 ENCOUNTER — Ambulatory Visit (INDEPENDENT_AMBULATORY_CARE_PROVIDER_SITE_OTHER): Payer: 59 | Admitting: Physician Assistant

## 2018-11-07 ENCOUNTER — Other Ambulatory Visit: Payer: Self-pay

## 2018-11-07 DIAGNOSIS — E669 Obesity, unspecified: Secondary | ICD-10-CM

## 2018-11-07 DIAGNOSIS — E119 Type 2 diabetes mellitus without complications: Secondary | ICD-10-CM | POA: Diagnosis not present

## 2018-11-07 DIAGNOSIS — E559 Vitamin D deficiency, unspecified: Secondary | ICD-10-CM

## 2018-11-07 DIAGNOSIS — Z6831 Body mass index (BMI) 31.0-31.9, adult: Secondary | ICD-10-CM

## 2018-11-07 DIAGNOSIS — Z794 Long term (current) use of insulin: Secondary | ICD-10-CM | POA: Diagnosis not present

## 2018-11-07 MED ORDER — JARDIANCE 25 MG PO TABS: 25.0000 mg | ORAL_TABLET | Freq: Every day | ORAL | 0 refills | Status: DC

## 2018-11-07 MED ORDER — VITAMIN D (ERGOCALCIFEROL) 1.25 MG (50000 UNIT) PO CAPS: 50000.0000 [IU] | ORAL_CAPSULE | ORAL | 0 refills | Status: DC

## 2018-11-07 MED FILL — JARDIANCE 25 MG TABLET: 25 | 30 days supply | Qty: 30 | Fill #0

## 2018-11-09 NOTE — Progress Notes (Signed)
Office: [phone]  /  Fax: [phone] TeleHealth Visit:  [name] has verbally consented to this TeleHealth visit today. The patient is located at home, the provider is located at the [employer] [facility] office. The participants in this visit include the listed provider and patient. The visit was conducted today via FaceTime.  HPI:   Chief Complaint: OBESITY [name] is here to discuss his progress with his obesity treatment plan. He is on the Category 3 plan. He states he is walking 30 minutes 2-3 times per week. Trasean reports that he is doing better on his plan. His appetite is returning, although he is not eating all of the food on the plan and does not eat his snack calories.  We were unable to weigh the patient today for this TeleHealth visit. He feels as if he has maintained his weight since his last visit. He has lost 3 lbs since starting treatment with [name].  Vitamin D deficiency [name] has a diagnosis of Vitamin D deficiency. He is currently taking prescription Vit D and denies nausea, vomiting or muscle weakness.  Diabetes Mellitus [name] has a diagnosis of diabetes mellitus and is on Jardiance and insulin. [name] states fasting blood sugar this a.m. was 136. He denies any hypoglycemic episodes. Last A1c was 10.2 on 07/18/2018. He has been working on intensive lifestyle modifications including diet, exercise, and weight loss to help control his blood glucose levels.  ASSESSMENT AND PLAN:  Vitamin D deficiency - Plan: Vitamin D, Ergocalciferol, (DRISDOL) 1.25 MG (50000 UT) CAPS capsule  Type 2 diabetes mellitus without complication, with long-term current use of insulin (HCC) - Plan: empagliflozin (JARDIANCE) 25 MG TABS tablet  PLAN:  Vitamin D Deficiency [name] was informed that low Vitamin D levels contributes to fatigue and are associated with obesity, breast, and colon cancer. He agrees to continue to take prescription Vit D @ 50,000 IU every week #4 with 0 refills  and will follow-up for routine testing of Vitamin D, at least 2-3 times per year. He was informed of the risk of over-replacement of Vitamin D and agrees to not increase his dose unless he discusses this with [name] first. [name] agrees to follow-up with our clinic in 2 weeks.  Diabetes Mellitus [name] has been given extensive diabetes education by myself today including ideal fasting and post-prandial blood glucose readings, individual ideal Hgb A1c goals  and hypoglycemia prevention. We discussed the importance of good blood sugar control to decrease the likelihood of diabetic complications such as nephropathy, neuropathy, limb loss, blindness, coronary artery disease, and death. We discussed the importance of intensive lifestyle modification including diet, exercise and weight loss as the first line treatment for diabetes. [name] will increase his Jardiance to 25 mg 1 PO daily #30 with 0 refills and agrees to follow-up with our clinic in 2 weeks.  Obesity [name] is currently in the action stage of change. As such, his goal is to continue with weight loss efforts. He has agreed to follow the Category 3 plan. [name] has been instructed to work up to a goal of 150 minutes of combined cardio and strengthening exercise per week for weight loss and overall health benefits. We discussed the following Behavioral Modification Strategies today: work on meal planning, easy cooking plans, and keeping healthy foods in the home.  [name] has agreed to follow-up with our clinic in 2 weeks. He was informed of the importance of frequent follow-up visits to maximize his success with intensive lifestyle modifications for his  multiple health conditions.  ALLERGIES: Allergies  Allergen Reactions   Pepcid [Famotidine] Other (See Comments)    Contraindicated with ODEFSEY   Prilosec [Omeprazole] Other (See Comments)    Contraindicated with RPV (lowers level of this ARV in ODEFSEY    MEDICATIONS: Current Outpatient  Medications on File Prior to Visit  Medication Sig Dispense Refill   aspirin 81 MG tablet Take 1 tablet (81 mg total) by mouth every evening. 30 tablet    chlorthalidone (HYGROTON) 25 MG tablet Take 1 tablet (25 mg total) by mouth daily. 90 tablet 3   emtricitabine-rilpivir-tenofovir AF (ODEFSEY) 200-25-25 MG TABS tablet Take 1 tablet by mouth daily with breakfast. 30 tablet 11   insulin glargine (LANTUS SOLOSTAR) 100 UNIT/ML injection Inject 45 Units into the skin at bedtime.      insulin lispro (HUMALOG) 100 UNIT/ML injection Inject 10-16 Units into the skin 3 (three) times daily before meals.     lisinopril (PRINIVIL,ZESTRIL) 10 MG tablet Take 1 tablet (10 mg total) by mouth daily. 90 tablet 3   rosuvastatin (CRESTOR) 40 MG tablet Take 1 tablet (40 mg total) by mouth daily. 90 tablet 3   ezetimibe (ZETIA) 10 MG tablet Take 1 tablet (10 mg total) by mouth daily. 90 tablet 3   No current facility-administered medications on file prior to visit.     PAST MEDICAL HISTORY: Past Medical History:  Diagnosis Date   CAD (coronary artery disease)    DES.  /  ... Later (10/2007) intervention for in-stent restenosis ;  EF 60%... catheterization... 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 October, 2010     Syphilitic hepatitis     PAST SURGICAL HISTORY: Past Surgical History:  Procedure Laterality Date   CHOLECYSTECTOMY OPEN  07/2001   COLONOSCOPY N/A 05/28/2017   Procedure: COLONOSCOPY;  Surgeon: Rogene Houston, MD;  [facility];  Service: Endoscopy;  Laterality: N/A;  200 - pt to prep in Endo   CORONARY ANGIOPLASTY WITH STENT PLACEMENT  03/2006; 10/2007;    HERNIA REPAIR     LAPAROSCOPIC INCISIONAL / UMBILICAL / [place]  07/2001   UHR   POLYPECTOMY  05/28/2017   Procedure: POLYPECTOMY;  Surgeon: Rogene Houston, MD;  [facility];  Service: Endoscopy;;  colon    SOCIAL HISTORY: Social History   Tobacco Use   Smoking status: Never Smoker   Smokeless tobacco: Never Used  Substance Use Topics   Alcohol use: Yes    Alcohol/week: 0.0 - 1.0 standard drinks    Comment: 03/23/2017 "might have a drink 4-6 times/year"   Drug use: No    FAMILY HISTORY: Family History  Problem Relation Age of Onset   Heart attack Father  7 heart attacks   CAD Father    Alcoholism Father    Hyperlipidemia Mother    Hypertension Mother    Anemia Other    Diabetes Other    CAD Sister    Diabetes Sister    ROS: Review of Systems  Gastrointestinal: Negative for nausea and vomiting.  Musculoskeletal:       Negative for muscle weakness.  Endo/Heme/Allergies:       Negative for hypoglycemia.   PHYSICAL EXAM: Pt in no acute distress  RECENT LABS AND TESTS: BMET    Component Value Date/Time   NA 141 07/18/2018 1023   K 4.2 07/18/2018 1023   CL 99 07/18/2018 1023   CO2 24 07/18/2018 1023   GLUCOSE 135 (H) 07/18/2018 1023   GLUCOSE 149 (H) 02/22/2018 1025   BUN 12 07/18/2018 1023   CREATININE 0.72 (L) 07/18/2018 1023   CREATININE 0.82 02/22/2018 1025   CALCIUM 9.4 07/18/2018 1023   GFRNONAA 107 07/18/2018 1023   GFRNONAA 102 02/22/2018 1025   GFRAA 123 07/18/2018 1023   GFRAA 118 02/22/2018 1025   Lab Results  Component  Value Date   HGBA1C 10.2 (H) 07/18/2018   HGBA1C 10.4 (H) 03/14/2018   HGBA1C 9.8 (A) 12/13/2017   HGBA1C 10.6 (H) 03/23/2017   HGBA1C 8.5 03/30/2009   No results found for: INSULIN CBC    Component Value Date/Time   WBC 7.3 02/22/2018 1025   RBC 5.39 02/22/2018 1025   HGB 15.4 02/22/2018 1025   HCT 44.7 02/22/2018 1025   PLT 286 02/22/2018 1025   MCV 82.9 02/22/2018 1025   MCH 28.6 02/22/2018 1025   MCHC 34.5 02/22/2018 1025   RDW 12.8 02/22/2018 1025   LYMPHSABS 2,949 02/22/2018 1025   MONOABS 0.3 03/23/2017 1524   EOSABS 241 02/22/2018 1025   BASOSABS 51 02/22/2018 1025   Iron/TIBC/Ferritin/ %Sat No results found for: IRON, TIBC, FERRITIN, IRONPCTSAT Lipid Panel     Component Value Date/Time   CHOL 187 07/18/2018 1023   TRIG 216 (H) 07/18/2018 1023   HDL 41 07/18/2018 1023   CHOLHDL 4.2 02/22/2018 1025   VLDL 59 (H) 10/28/2015 0957   LDLCALC 103 (H) 07/18/2018 1023   LDLCALC 66 02/22/2018 1025   Hepatic Function Panel     Component Value Date/Time   PROT 6.6 07/18/2018 1023   ALBUMIN 4.2 07/18/2018 1023   AST 24 07/18/2018 1023   ALT 29 07/18/2018 1023   ALKPHOS 56 07/18/2018 1023   BILITOT 0.3 07/18/2018 1023    Results for DEZ, STAUFFER (MRN [id number]) as of 11/09/2018 10:57  Ref. Range 07/18/2018 10:23  Vitamin D, 25-Hydroxy Latest Ref Range: 30.0 - 100.0 ng/mL 27.3 (L)    I, Michaelene Song, am acting as Location manager for [employer], PA-C I, Abby Potash, PA-C have reviewed above note and agree with its conten

## 2018-11-17 MED FILL — ODEFSEY 200-25-25 MG TABS: 200-25-25 | 30 days supply | Qty: 30 | Fill #10

## 2018-11-17 MED FILL — FREESTYLE LIBRE 14 DAY SENS: 84 days supply | Qty: 6 | Fill #1

## 2018-11-21 ENCOUNTER — Encounter (INDEPENDENT_AMBULATORY_CARE_PROVIDER_SITE_OTHER): Payer: Self-pay | Admitting: Physician Assistant

## 2018-11-21 ENCOUNTER — Other Ambulatory Visit: Payer: Self-pay

## 2018-11-21 ENCOUNTER — Telehealth (INDEPENDENT_AMBULATORY_CARE_PROVIDER_SITE_OTHER): Payer: 59 | Admitting: Physician Assistant

## 2018-11-21 DIAGNOSIS — Z794 Long term (current) use of insulin: Secondary | ICD-10-CM

## 2018-11-21 DIAGNOSIS — E559 Vitamin D deficiency, unspecified: Secondary | ICD-10-CM | POA: Diagnosis not present

## 2018-11-21 DIAGNOSIS — E669 Obesity, unspecified: Secondary | ICD-10-CM

## 2018-11-21 DIAGNOSIS — E1165 Type 2 diabetes mellitus with hyperglycemia: Secondary | ICD-10-CM | POA: Diagnosis not present

## 2018-11-21 DIAGNOSIS — Z6832 Body mass index (BMI) 32.0-32.9, adult: Secondary | ICD-10-CM

## 2018-11-21 MED ORDER — VITAMIN D (ERGOCALCIFEROL) 1.25 MG (50000 UNIT) PO CAPS: 50000.0000 [IU] | ORAL_CAPSULE | ORAL | 0 refills | Status: DC

## 2018-11-21 MED FILL — VIT D2 1.25 MG (50,000 UNIT: 1.25 MG | 28 days supply | Qty: 4 | Fill #0

## 2018-11-22 NOTE — Progress Notes (Signed)
Office: [phone]  /  Fax: [phone] TeleHealth Visit:  [name] has verbally consented to this TeleHealth visit today. The patient is located at home, the provider is located at the [employer] [facility] office. The participants in this visit include the listed provider and patient. The visit was conducted today via FaceTime.  HPI:   Chief Complaint: OBESITY [name] is here to discuss his progress with his obesity treatment plan. He is on the Category 3 plan and is following his eating plan approximately 40% of the time. He states he is walking 10-20 minutes 3-4 times per week. [name] reports his appetite has returned. He is not eating all of his protein, especially at dinner. We were unable to weigh the patient today for this TeleHealth visit. He feels as if he has maintained his weight since his last visit. He has lost 3 lbs since starting treatment with [name].  Vitamin D deficiency [name] has a diagnosis of Vitamin D deficiency. He is currently taking prescription Vit D and denies nausea, vomiting or muscle weakness.  Diabetes II with Hyperglycemia [name] has a diagnosis of diabetes type II and is on Jardiance and insulin. [name] states fasting blood sugar was 115 this a.m. He denies any hypoglycemic episodes. Last A1c was 10.2 on 07/18/2018. He has been working on intensive lifestyle modifications including diet, exercise, and weight loss to help control his blood glucose levels.  ASSESSMENT AND PLAN:  Vitamin D deficiency - Plan: Vitamin D, Ergocalciferol, (DRISDOL) 1.25 MG (50000 UT) CAPS capsule  Type 2 diabetes mellitus with hyperglycemia, with long-term current use of insulin (HCC)  Class 1 obesity with serious comorbidity and body mass index (BMI) of 32.0 to 32.9 in adult, unspecified obesity type  PLAN:  Vitamin D Deficiency [name] was informed that low Vitamin D levels contributes to fatigue and are associated with obesity, breast, and colon cancer. He agrees to  continue to take prescription Vit D @ 50,000 IU every week #4 with 0 refills and will follow-up for routine testing of Vitamin D, at least 2-3 times per year. He was informed of the risk of over-replacement of Vitamin D and agrees to not increase his dose unless he discusses this with [name] first. [name] agrees to follow-up with our clinic in 3 weeks.  Diabetes II with Hyperglycemia [name] has been given extensive diabetes education by myself today including ideal fasting and post-prandial blood glucose readings, individual ideal HgA1c goals  and hypoglycemia prevention. We discussed the importance of good blood sugar control to decrease the likelihood of diabetic complications such as nephropathy, neuropathy, limb loss, blindness, coronary artery disease, and death. We discussed the importance of intensive lifestyle modification including diet, exercise and weight loss as the first line treatment for diabetes. [name] agrees to continue his diabetes medications and will follow-up at the agreed upon time.  Obesity [name] is currently in the action stage of change. As such, his goal is to continue with weight loss efforts. He has agreed to follow the Category 3 plan. [name] has been instructed to work up to a goal of 150 minutes of combined cardio and strengthening exercise per week for weight loss and overall health benefits. We discussed the following Behavioral Modification Strategies today: no skipping meals, work on meal planning and easy cooking plans.  [name] has agreed to follow-up with our clinic in 3 weeks. He was informed of the importance of frequent follow-up visits to maximize his success with intensive lifestyle modifications for his multiple health  conditions.  ALLERGIES: Allergies  Allergen Reactions   Pepcid [Famotidine] Other (See Comments)    Contraindicated with ODEFSEY   Prilosec [Omeprazole] Other (See Comments)    Contraindicated with RPV (lowers level of this ARV in ODEFSEY     MEDICATIONS: Current Outpatient Medications on File Prior to Visit  Medication Sig Dispense Refill   aspirin 81 MG tablet Take 1 tablet (81 mg total) by mouth every evening. 30 tablet    chlorthalidone (HYGROTON) 25 MG tablet Take 1 tablet (25 mg total) by mouth daily. 90 tablet 3   empagliflozin (JARDIANCE) 25 MG TABS tablet Take 25 mg by mouth daily. 30 tablet 0   emtricitabine-rilpivir-tenofovir AF (ODEFSEY) 200-25-25 MG TABS tablet Take 1 tablet by mouth daily with breakfast. 30 tablet 11   ezetimibe (ZETIA) 10 MG tablet Take 1 tablet (10 mg total) by mouth daily. 90 tablet 3   insulin glargine (LANTUS SOLOSTAR) 100 UNIT/ML injection Inject 45 Units into the skin at bedtime.      insulin lispro (HUMALOG) 100 UNIT/ML injection Inject 10-16 Units into the skin 3 (three) times daily before meals.     lisinopril (PRINIVIL,ZESTRIL) 10 MG tablet Take 1 tablet (10 mg total) by mouth daily. 90 tablet 3   rosuvastatin (CRESTOR) 40 MG tablet Take 1 tablet (40 mg total) by mouth daily. 90 tablet 3   No current facility-administered medications on file prior to visit.     PAST MEDICAL HISTORY: Past Medical History:  Diagnosis Date   CAD (coronary artery disease)    DES.  /  ... Later (10/2007) intervention for in-stent restenosis ;  EF 60%... catheterization... 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 October, 2010   Syphilitic hepatitis     PAST SURGICAL HISTORY: Past Surgical History:  Procedure Laterality Date   CHOLECYSTECTOMY OPEN  07/2001   COLONOSCOPY N/A 05/28/2017   Procedure: COLONOSCOPY;  Surgeon: Rogene Houston, MD;  [facility];  Service: Endoscopy;  Laterality: N/A;  200 - pt to prep in Endo   CORONARY ANGIOPLASTY WITH STENT PLACEMENT  03/2006; 10/2007;    HERNIA REPAIR     LAPAROSCOPIC INCISIONAL / UMBILICAL / [place]  07/2001   UHR   POLYPECTOMY  05/28/2017   Procedure: POLYPECTOMY;  Surgeon: Rogene Houston, MD;  Location: [facility];  Service: Endoscopy;;  colon    SOCIAL HISTORY: Social History   Tobacco Use   Smoking status: Never Smoker   Smokeless tobacco: Never Used  Substance Use Topics   Alcohol use: Yes    Alcohol/week: 0.0 - 1.0 standard drinks    Comment: 03/23/2017 "might have a drink 4-6 times/year"   Drug use: No    FAMILY HISTORY: Family History  Problem Relation Age of Onset   Heart attack Father        7 heart attacks   CAD Father    Alcoholism Father    Hyperlipidemia Mother    Hypertension Mother    Anemia Other    Diabetes Other    CAD Sister    Diabetes Sister    ROS: Review of Systems  Gastrointestinal: Negative for nausea and vomiting.  Musculoskeletal:       Negative for muscle weakness.  Endo/Heme/Allergies:       Negative for hypoglycemia.   PHYSICAL EXAM: Pt in no acute distress  RECENT LABS AND TESTS: BMET    Component Value Date/Time   NA 141 07/18/2018 1023   K 4.2 07/18/2018 1023   CL 99 07/18/2018 1023   CO2 24 07/18/2018 1023   GLUCOSE 135 (H) 07/18/2018 1023   GLUCOSE 149 (H) 02/22/2018 1025   BUN 12 07/18/2018 1023   CREATININE 0.72 (L) 07/18/2018 1023   CREATININE 0.82 02/22/2018 1025   CALCIUM 9.4 07/18/2018 1023   GFRNONAA 107  07/18/2018 1023   GFRNONAA 102 02/22/2018 1025   GFRAA 123 07/18/2018 1023   GFRAA 118 02/22/2018 1025   Lab Results  Component Value Date   HGBA1C 10.2 (H) 07/18/2018   HGBA1C 10.4 (H) 03/14/2018   HGBA1C 9.8 (A) 12/13/2017   HGBA1C 10.6 (H) 03/23/2017   HGBA1C 8.5 03/30/2009   No results found for: INSULIN CBC    Component Value Date/Time   WBC 7.3 02/22/2018 1025   RBC 5.39 02/22/2018 1025   HGB 15.4 02/22/2018 1025   HCT 44.7 02/22/2018 1025   PLT 286 02/22/2018 1025   MCV 82.9 02/22/2018 1025   MCH 28.6 02/22/2018 1025   MCHC 34.5 02/22/2018 1025   RDW 12.8 02/22/2018 1025   LYMPHSABS 2,949 02/22/2018 1025   MONOABS 0.3 03/23/2017 1524   EOSABS 241 02/22/2018 1025   BASOSABS 51 02/22/2018 1025   Iron/TIBC/Ferritin/ %Sat No results found for: IRON, TIBC, FERRITIN, IRONPCTSAT Lipid Panel     Component Value Date/Time   CHOL 187 07/18/2018 1023   TRIG 216 (H) 07/18/2018 1023   HDL 41 07/18/2018 1023   CHOLHDL 4.2 02/22/2018 1025   VLDL 59 (H) 10/28/2015 0957   LDLCALC 103 (H) 07/18/2018 1023   LDLCALC 66 02/22/2018 1025   Hepatic Function Panel     Component Value Date/Time   PROT 6.6 07/18/2018 1023   ALBUMIN 4.2 07/18/2018 1023   AST 24 07/18/2018 1023   ALT 29 07/18/2018 1023   ALKPHOS 56 07/18/2018 1023   BILITOT 0.3 07/18/2018 1023    Results for HALIL, RENTZ (MRN [id number]) as of 11/22/2018 08:31  Ref. Range 07/18/2018 10:23  Vitamin D, 25-Hydroxy Latest Ref Range: 30.0 - 100.0 ng/mL 27.3 (L)   I, Michaelene Song, am acting as Location manager for [employer], PA-C I, Abby Potash, PA-C have reviewed above note and agree with its content

## 2018-12-12 ENCOUNTER — Other Ambulatory Visit: Payer: Self-pay

## 2018-12-12 ENCOUNTER — Encounter (INDEPENDENT_AMBULATORY_CARE_PROVIDER_SITE_OTHER): Payer: Self-pay | Admitting: Physician Assistant

## 2018-12-12 ENCOUNTER — Telehealth (INDEPENDENT_AMBULATORY_CARE_PROVIDER_SITE_OTHER): Payer: 59 | Admitting: Physician Assistant

## 2018-12-12 DIAGNOSIS — E119 Type 2 diabetes mellitus without complications: Secondary | ICD-10-CM | POA: Diagnosis not present

## 2018-12-12 DIAGNOSIS — E1165 Type 2 diabetes mellitus with hyperglycemia: Secondary | ICD-10-CM | POA: Diagnosis not present

## 2018-12-12 DIAGNOSIS — E559 Vitamin D deficiency, unspecified: Secondary | ICD-10-CM | POA: Diagnosis not present

## 2018-12-12 DIAGNOSIS — Z794 Long term (current) use of insulin: Secondary | ICD-10-CM

## 2018-12-12 DIAGNOSIS — E669 Obesity, unspecified: Secondary | ICD-10-CM | POA: Diagnosis not present

## 2018-12-12 DIAGNOSIS — Z6831 Body mass index (BMI) 31.0-31.9, adult: Secondary | ICD-10-CM

## 2018-12-12 MED ORDER — BD PEN NEEDLE NANO 2ND GEN 32G X 4 MM MISC: 1.0000 | Freq: Two times a day (BID) | 0 refills | Status: DC

## 2018-12-12 MED ORDER — VITAMIN D (ERGOCALCIFEROL) 1.25 MG (50000 UNIT) PO CAPS: 50000.0000 [IU] | ORAL_CAPSULE | ORAL | 0 refills | Status: DC

## 2018-12-12 MED ORDER — JARDIANCE 25 MG PO TABS: 25.0000 mg | ORAL_TABLET | Freq: Every day | ORAL | 0 refills | Status: DC

## 2018-12-12 NOTE — Progress Notes (Signed)
Office: [phone]  /  Fax: [phone] TeleHealth Visit:  [name] has verbally consented to this TeleHealth visit today. The patient is located at home, the provider is located at the [employer] [facility] office. The participants in this visit include the listed provider and patient. The visit was conducted today via FaceTime.  HPI:   Chief Complaint: OBESITY [name] is here to discuss his progress with his obesity treatment plan. He is on the Category 3 plan and is following his eating plan approximately 80% of the time. He states he is walking 1-2 days a week.  [name] reports that he is eating on plan for breakfast and he is not getting enough protein during lunch and dinner. We were unable to weigh the patient today for this TeleHealth visit. He is unsure if he has lost or gained weight since his last visit. He has lost 3 lbs since starting treatment with [name].  Vitamin D deficiency [name] has a diagnosis of Vitamin D deficiency. He is currently taking prescription Vit D and denies nausea, vomiting or muscle weakness.  Diabetes II with Hyperglycemia and Use of Insulin [name] has a diagnosis of diabetes type II and is on Jardiance and insulin. [name] states fasting blood sugars are in the 140's and reports his blood sugar this morning was 62. He reports he did not eat a lot yesterday. Last A1c was 10.2 on 07/18/2018. He has been working on intensive lifestyle modifications including diet, exercise, and weight loss to help control his blood glucose levels.  ASSESSMENT AND PLAN:  Type 2 diabetes mellitus with hyperglycemia, with long-term current use of insulin (HCC) - Plan: Insulin Pen Needle (BD PEN NEEDLE NANO 2ND GEN) 32G X 4 MM MISC  Vitamin D deficiency - Plan: Vitamin D, Ergocalciferol, (DRISDOL) 1.25 MG (50000 UT) CAPS capsule  Class 1 obesity with serious comorbidity and body mass index (BMI) of 31.0 to 31.9 in adult, unspecified obesity type  Type 2 diabetes mellitus  without complication, with long-term current use of insulin (HCC) - Plan: empagliflozin (JARDIANCE) 25 MG TABS tablet  PLAN:  Vitamin D Deficiency [name] was informed that low Vitamin D levels contributes to fatigue and are associated with obesity, breast, and colon cancer. He agrees to continue to take prescription Vit D @ 50,000 IU every week #4 with 0 refills and will follow-up for routine testing of Vitamin D, at least 2-3 times per year. He was informed of the risk of over-replacement of Vitamin D and agrees to not increase his dose unless he discusses this with [name] first. [name] agrees to follow-up with our clinic in 2 weeks.  Diabetes II with Hyperglycemia and Use of Insulin [name] has been given extensive diabetes education by myself today including ideal fasting and post-prandial blood glucose readings, individual ideal HgA1c goals  and hypoglycemia prevention. We discussed the importance of good blood sugar control to decrease the likelihood of diabetic complications such as nephropathy, neuropathy, limb loss, blindness, coronary artery disease, and death. We discussed the importance of intensive lifestyle modification including diet, exercise and weight loss as the first line treatment for diabetes. [name] was given a refill on his Jardiance #30 with 0 refills and #100 Nano needles with 0 refills and agrees to follow-up with our clinic in 2 weeks.  Obesity [name] is currently in the action stage of change. As such, his goal is to continue with weight loss efforts. He has agreed to follow the Category 3 plan. [name] has been instructed  to work up to a goal of 150 minutes of combined cardio and strengthening exercise per week for weight loss and overall health benefits. We discussed the following Behavioral Modification Strategies today: decreasing simple carbohydrates, work on meal planning and easy cooking plans.  [name] has agreed to follow-up with our clinic in 2 weeks. He was informed of the  importance of frequent follow-up visits to maximize his success with intensive lifestyle modifications for his multiple health conditions.  ALLERGIES: Allergies  Allergen Reactions   Pepcid [Famotidine] Other (See Comments)    Contraindicated with ODEFSEY   Prilosec [Omeprazole] Other (See Comments)    Contraindicated with RPV (lowers level of this ARV in ODEFSEY    MEDICATIONS: Current Outpatient Medications on File Prior to Visit  Medication Sig Dispense Refill   aspirin 81 MG tablet Take 1 tablet (81 mg total) by mouth every evening. 30 tablet    chlorthalidone (HYGROTON) 25 MG tablet Take 1 tablet (25 mg total) by mouth daily. 90 tablet 3   emtricitabine-rilpivir-tenofovir AF (ODEFSEY) 200-25-25 MG TABS tablet Take 1 tablet by mouth daily with breakfast. 30 tablet 11   ezetimibe (ZETIA) 10 MG tablet Take 1 tablet (10 mg total) by mouth daily. 90 tablet 3   insulin glargine (LANTUS SOLOSTAR) 100 UNIT/ML injection Inject 45 Units into the skin at bedtime.      insulin lispro (HUMALOG) 100 UNIT/ML injection Inject 10-16 Units into the skin 3 (three) times daily before meals.     lisinopril (PRINIVIL,ZESTRIL) 10 MG tablet Take 1 tablet (10 mg total) by mouth daily. 90 tablet 3   rosuvastatin (CRESTOR) 40 MG tablet Take 1 tablet (40 mg total) by mouth daily. 90 tablet 3   No current facility-administered medications on file prior to visit.     PAST MEDICAL HISTORY: Past Medical History:  Diagnosis Date   CAD (coronary artery disease)    DES.  /  ... Later (10/2007) intervention for in-stent restenosis ;  EF 60%... catheterization... 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 October, 2010   Syphilitic hepatitis     PAST SURGICAL HISTORY: Past Surgical History:  Procedure Laterality Date   CHOLECYSTECTOMY OPEN  07/2001   COLONOSCOPY N/A 05/28/2017   Procedure: COLONOSCOPY;  Surgeon: Rogene Houston, MD;  [facility];  Service: Endoscopy;  Laterality: N/A;  200 - pt to prep in Endo   CORONARY ANGIOPLASTY WITH STENT PLACEMENT  03/2006; 10/2007;    HERNIA REPAIR     LAPAROSCOPIC INCISIONAL / UMBILICAL / [place]  07/2001   UHR   POLYPECTOMY  05/28/2017   Procedure: POLYPECTOMY;  Surgeon: Rogene Houston, MD;  [facility];  Service: Endoscopy;;  colon    SOCIAL HISTORY:  Social History   Tobacco Use   Smoking status: Never Smoker   Smokeless tobacco: Never Used  Substance Use Topics   Alcohol use: Yes    Alcohol/week: 0.0 - 1.0 standard drinks    Comment: 03/23/2017 "might have a drink 4-6 times/year"   Drug use: No    FAMILY HISTORY: Family History  Problem Relation Age of Onset   Heart attack Father        7 heart attacks   CAD Father    Alcoholism Father    Hyperlipidemia Mother    Hypertension Mother    Anemia Other    Diabetes Other    CAD Sister    Diabetes Sister    ROS: Review of Systems  Gastrointestinal: Negative for nausea and vomiting.  Musculoskeletal:       Negative for muscle weakness.   PHYSICAL EXAM: Pt in no acute distress  RECENT LABS AND TESTS: BMET    Component Value Date/Time   NA 141 07/18/2018 1023   K 4.2 07/18/2018 1023   CL 99 07/18/2018 1023   CO2 24  07/18/2018 1023   GLUCOSE 135 (H) 07/18/2018 1023   GLUCOSE 149 (H) 02/22/2018 1025   BUN 12 07/18/2018 1023   CREATININE 0.72 (L) 07/18/2018 1023   CREATININE 0.82 02/22/2018 1025   CALCIUM 9.4 07/18/2018 1023   GFRNONAA 107 07/18/2018 1023   GFRNONAA 102 02/22/2018 1025   GFRAA 123 07/18/2018 1023   GFRAA 118 02/22/2018 1025   Lab Results  Component Value Date   HGBA1C 10.2 (H) 07/18/2018   HGBA1C 10.4 (H) 03/14/2018   HGBA1C 9.8 (A) 12/13/2017   HGBA1C 10.6 (H) 03/23/2017   HGBA1C 8.5 03/30/2009   No results found for: INSULIN CBC    Component Value Date/Time   WBC 7.3 02/22/2018 1025   RBC 5.39 02/22/2018 1025   HGB 15.4 02/22/2018 1025   HCT 44.7 02/22/2018 1025   PLT 286 02/22/2018 1025   MCV 82.9 02/22/2018 1025   MCH 28.6 02/22/2018 1025   MCHC 34.5 02/22/2018 1025   RDW 12.8 02/22/2018 1025   LYMPHSABS 2,949 02/22/2018 1025   MONOABS 0.3 03/23/2017 1524   EOSABS 241 02/22/2018 1025   BASOSABS 51 02/22/2018 1025   Iron/TIBC/Ferritin/ %Sat No results found for: IRON, TIBC, FERRITIN, IRONPCTSAT Lipid Panel     Component Value Date/Time   CHOL 187 07/18/2018 1023   TRIG 216 (H) 07/18/2018 1023   HDL 41 07/18/2018 1023   CHOLHDL 4.2 02/22/2018 1025   VLDL 59 (H) 10/28/2015 0957   LDLCALC 103 (H) 07/18/2018 1023   LDLCALC 66 02/22/2018 1025   Hepatic Function Panel     Component Value Date/Time   PROT 6.6 07/18/2018 1023   ALBUMIN 4.2 07/18/2018 1023   AST 24 07/18/2018 1023   ALT 29 07/18/2018 1023   ALKPHOS 56 07/18/2018 1023   BILITOT 0.3 07/18/2018 1023    Results for DYLLEN, MENNING (MRN [id number]) as of 12/12/2018 15:09  Ref. Range 07/18/2018 10:23  Vitamin D, 25-Hydroxy Latest Ref Range: 30.0 - 100.0 ng/mL 27.3 (L)   I, Michaelene Song, am acting as Location manager for [employer], PA-C I, Abby Potash, PA-C have reviewed above note and agree with its content

## 2018-12-13 MED FILL — ODEFSEY 200-25-25 MG TABS: 200-25-25 | 30 days supply | Qty: 30 | Fill #11

## 2018-12-13 MED FILL — JARDIANCE 25 MG TABLET: 25 | 30 days supply | Qty: 30 | Fill #0

## 2018-12-13 MED FILL — VIT D2 1.25 MG (50,000 UNIT: 1.25 MG | 28 days supply | Qty: 4 | Fill #0

## 2018-12-13 MED FILL — LANTUS SOLOSTAR 100 UNITS/M: 100 | 14 days supply | Qty: 15 | Fill #3

## 2018-12-13 MED FILL — UNIFINE PENTIPS 32GX5/32: 32G X 4 MM | 50 days supply | Qty: 100 | Fill #0

## 2018-12-13 MED FILL — HUMALOG 100 UNITS/ML KWIKPE: 100 | 90 days supply | Qty: 90 | Fill #1

## 2018-12-26 ENCOUNTER — Other Ambulatory Visit: Payer: Self-pay

## 2018-12-26 ENCOUNTER — Telehealth (INDEPENDENT_AMBULATORY_CARE_PROVIDER_SITE_OTHER): Payer: 59 | Admitting: Physician Assistant

## 2018-12-26 ENCOUNTER — Encounter (INDEPENDENT_AMBULATORY_CARE_PROVIDER_SITE_OTHER): Payer: Self-pay | Admitting: Physician Assistant

## 2018-12-26 DIAGNOSIS — Z6832 Body mass index (BMI) 32.0-32.9, adult: Secondary | ICD-10-CM | POA: Diagnosis not present

## 2018-12-26 DIAGNOSIS — Z794 Long term (current) use of insulin: Secondary | ICD-10-CM

## 2018-12-26 DIAGNOSIS — E1165 Type 2 diabetes mellitus with hyperglycemia: Secondary | ICD-10-CM | POA: Diagnosis not present

## 2018-12-26 DIAGNOSIS — E669 Obesity, unspecified: Secondary | ICD-10-CM | POA: Diagnosis not present

## 2018-12-26 DIAGNOSIS — E559 Vitamin D deficiency, unspecified: Secondary | ICD-10-CM | POA: Diagnosis not present

## 2018-12-26 MED ORDER — BD PEN NEEDLE NANO U/F 32G X 4 MM MISC: 1.0000 | Freq: Every day | 0 refills | Status: DC

## 2018-12-26 MED ORDER — VICTOZA 18 MG/3ML ~~LOC~~ SOPN: 0.6000 mg | PEN_INJECTOR | Freq: Every day | SUBCUTANEOUS | 0 refills | Status: DC

## 2018-12-26 MED ORDER — VITAMIN D (ERGOCALCIFEROL) 1.25 MG (50000 UNIT) PO CAPS: 50000.0000 [IU] | ORAL_CAPSULE | ORAL | 0 refills | Status: DC

## 2018-12-26 MED FILL — VICTOZA 18 MG/3 ML INJECT P: 18 | 30 days supply | Qty: 3 | Fill #0

## 2018-12-26 MED FILL — LISINOPRIL 10 MG TABS: 10 | 90 days supply | Qty: 90 | Fill #1

## 2018-12-26 MED FILL — CHLORTHALIDONE 25 MG TABS: 25 | 90 days supply | Qty: 90 | Fill #1

## 2018-12-26 NOTE — Progress Notes (Signed)
Office: [phone]  /  Fax: [phone] TeleHealth Visit:  [name] has verbally consented to this TeleHealth visit today. The patient is located at home, the provider is located at the [employer] [facility] office. The participants in this visit include the listed provider and patient. The visit was conducted today via FaceTime.  HPI:   Chief Complaint: OBESITY [name] is here to discuss his progress with his obesity treatment plan. He is on the Category 3 plan and is following his eating plan approximately 30-40% of the time. He states he is exercising 0 minutes 0 times per week. [name] reports that his appetite has fully returned and he is overeating during his meals. We were unable to weigh the patient today for this TeleHealth visit. He feels as if he has gained weight since his last visit. He has lost 3 lbs since starting treatment with [name].  Diabetes II with Hyperglycemia and Long-Term Use of Insulin [name] has a diagnosis of diabetes type II. [name] states fasting blood sugars range between 120 and 140's. Last A1c was 10.2 on 07/18/2018. He has been working on intensive lifestyle modifications including diet, exercise, and weight loss to help control his blood glucose levels.  Vitamin D deficiency [name] has a diagnosis of Vitamin D deficiency. He is currently taking prescription Vit D and denies nausea, vomiting or muscle weakness.  ASSESSMENT AND PLAN:  Type 2 diabetes mellitus with hyperglycemia, with long-term current use of insulin (HCC) - Plan: liraglutide (VICTOZA) 18 MG/3ML SOPN, Insulin Pen Needle (BD PEN NEEDLE NANO U/F) 32G X 4 MM MISC  Vitamin D deficiency - Plan: Vitamin D, Ergocalciferol, (DRISDOL) 1.25 MG (50000 UT) CAPS capsule  Class 1 obesity with serious comorbidity and body mass index (BMI) of 32.0 to 32.9 in adult, unspecified obesity type  PLAN:  Diabetes II with Hyperglycemia and Long-Term Use of Insulin [name] has been given extensive diabetes  education by myself today including ideal fasting and post-prandial blood glucose readings, individual ideal HgA1c goals  and hypoglycemia prevention. We discussed the importance of good blood sugar control to decrease the likelihood of diabetic complications such as nephropathy, neuropathy, limb loss, blindness, coronary artery disease, and death. We discussed the importance of intensive lifestyle modification including diet, exercise and weight loss as the first line treatment for diabetes. [name] will start Victoza 0.6 mg daily #1 pen and #100 needles. He agrees to follow-up with our clinic in 2 weeks.  Vitamin D Deficiency [name] was informed that low Vitamin D levels contributes to fatigue and are associated with obesity, breast, and colon cancer. He agrees to continue to take prescription Vit D @ 50,000 IU every week #4 with 0 refills and will follow-up for routine testing of Vitamin D, at least 2-3 times per year. He was informed of the risk of over-replacement of Vitamin D and agrees to not increase his dose unless he discusses this with [name] first. [name] agrees to follow-up with our clinic in 2 weeks.  Obesity [name] is currently in the action stage of change. As such, his goal is to continue with weight loss efforts. He has agreed to follow the Category 3 plan. [name] has been instructed to work up to a goal of 150 minutes of combined cardio and strengthening exercise per week for weight loss and overall health benefits. We discussed the following Behavioral Modification Strategies today: work on meal planning and easy cooking plans, and keeping healthy foods in the home.  [name] has agreed to follow-up  with our clinic in 2 weeks. He was informed of the importance of frequent follow-up visits to maximize his success with intensive lifestyle modifications for his multiple health conditions.  ALLERGIES: Allergies  Allergen Reactions   Pepcid [Famotidine] Other (See Comments)    Contraindicated  with ODEFSEY   Prilosec [Omeprazole] Other (See Comments)    Contraindicated with RPV (lowers level of this ARV in ODEFSEY    MEDICATIONS: Current Outpatient Medications on File Prior to Visit  Medication Sig Dispense Refill   aspirin 81 MG tablet Take 1 tablet (81 mg total) by mouth every evening. 30 tablet    chlorthalidone (HYGROTON) 25 MG tablet Take 1 tablet (25 mg total) by mouth daily. 90 tablet 3   empagliflozin (JARDIANCE) 25 MG TABS tablet Take 25 mg by mouth daily. 30 tablet 0   emtricitabine-rilpivir-tenofovir AF (ODEFSEY) 200-25-25 MG TABS tablet Take 1 tablet by mouth daily with breakfast. 30 tablet 11   ezetimibe (ZETIA) 10 MG tablet Take 1 tablet (10 mg total) by mouth daily. 90 tablet 3   insulin glargine (LANTUS SOLOSTAR) 100 UNIT/ML injection Inject 45 Units into the skin at bedtime.      insulin lispro (HUMALOG) 100 UNIT/ML injection Inject 10-16 Units into the skin 3 (three) times daily before meals.     Insulin Pen Needle (BD PEN NEEDLE NANO 2ND GEN) 32G X 4 MM MISC 1 Package by Does not apply route 2 (two) times daily. 100 each 0   lisinopril (PRINIVIL,ZESTRIL) 10 MG tablet Take 1 tablet (10 mg total) by mouth daily. 90 tablet 3   rosuvastatin (CRESTOR) 40 MG tablet Take 1 tablet (40 mg total) by mouth daily. 90 tablet 3   No current facility-administered medications on file prior to visit.     PAST MEDICAL HISTORY: Past Medical History:  Diagnosis Date   CAD (coronary artery disease)    DES.  /  ... Later (10/2007) intervention for in-stent restenosis ;  EF 60%... catheterization... 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 October, 2010   Syphilitic hepatitis     PAST SURGICAL HISTORY: Past Surgical History:  Procedure Laterality Date   CHOLECYSTECTOMY OPEN  07/2001   COLONOSCOPY N/A 05/28/2017   Procedure: COLONOSCOPY;  Surgeon: Rogene Houston, MD;  [facility];  Service: Endoscopy;  Laterality: N/A;  200 - pt to prep in Endo   CORONARY ANGIOPLASTY WITH STENT PLACEMENT  03/2006; 10/2007;    HERNIA REPAIR     LAPAROSCOPIC INCISIONAL / UMBILICAL / Fernando Salinas  07/2001   UHR   POLYPECTOMY  05/28/2017   Procedure: POLYPECTOMY;  Surgeon: Rogene Houston, MD;  Location: [facility];  Service: Endoscopy;;  colon    SOCIAL HISTORY: Social History  Tobacco Use   Smoking status: Never Smoker   Smokeless tobacco: Never Used  Substance Use Topics   Alcohol use: Yes    Alcohol/week: 0.0 - 1.0 standard drinks    Comment: 03/23/2017 "might have a drink 4-6 times/year"   Drug use: No    FAMILY HISTORY: Family History  Problem Relation Age of Onset   Heart attack Father        7 heart attacks   CAD Father    Alcoholism Father    Hyperlipidemia Mother    Hypertension Mother    Anemia Other    Diabetes Other    CAD Sister    Diabetes Sister    ROS: Review of Systems  Gastrointestinal: Negative for nausea and vomiting.  Musculoskeletal:       Negative for muscle weakness.   PHYSICAL EXAM: Pt in no acute distress  RECENT LABS AND TESTS: BMET    Component Value Date/Time   NA 141 07/18/2018 1023   K 4.2 07/18/2018 1023   CL 99 07/18/2018 1023   CO2 24 07/18/2018 1023   GLUCOSE 135 (H)  07/18/2018 1023   GLUCOSE 149 (H) 02/22/2018 1025   BUN 12 07/18/2018 1023   CREATININE 0.72 (L) 07/18/2018 1023   CREATININE 0.82 02/22/2018 1025   CALCIUM 9.4 07/18/2018 1023   GFRNONAA 107 07/18/2018 1023   GFRNONAA 102 02/22/2018 1025   GFRAA 123 07/18/2018 1023   GFRAA 118 02/22/2018 1025   Lab Results  Component Value Date   HGBA1C 10.2 (H) 07/18/2018   HGBA1C 10.4 (H) 03/14/2018   HGBA1C 9.8 (A) 12/13/2017   HGBA1C 10.6 (H) 03/23/2017   HGBA1C 8.5 03/30/2009   No results found for: INSULIN CBC    Component Value Date/Time   WBC 7.3 02/22/2018 1025   RBC 5.39 02/22/2018 1025   HGB 15.4 02/22/2018 1025   HCT 44.7 02/22/2018 1025   PLT 286 02/22/2018 1025   MCV 82.9 02/22/2018 1025   MCH 28.6 02/22/2018 1025   MCHC 34.5 02/22/2018 1025   RDW 12.8 02/22/2018 1025   LYMPHSABS 2,949 02/22/2018 1025   MONOABS 0.3 03/23/2017 1524   EOSABS 241 02/22/2018 1025   BASOSABS 51 02/22/2018 1025   Iron/TIBC/Ferritin/ %Sat No results found for: IRON, TIBC, FERRITIN, IRONPCTSAT Lipid Panel     Component Value Date/Time   CHOL 187 07/18/2018 1023   TRIG 216 (H) 07/18/2018 1023   HDL 41 07/18/2018 1023   CHOLHDL 4.2 02/22/2018 1025   VLDL 59 (H) 10/28/2015 0957   LDLCALC 103 (H) 07/18/2018 1023   LDLCALC 66 02/22/2018 1025   Hepatic Function Panel     Component Value Date/Time   PROT 6.6 07/18/2018 1023   ALBUMIN 4.2 07/18/2018 1023   AST 24 07/18/2018 1023   ALT 29 07/18/2018 1023   ALKPHOS 56 07/18/2018 1023   BILITOT 0.3 07/18/2018 1023    Results for QUEST, TAVENNER (MRN [id number]) as of 12/26/2018 14:23  Ref. Range 07/18/2018 10:23  Vitamin D, 25-Hydroxy Latest Ref Range: 30.0 - 100.0 ng/mL 27.3 (L)   I, Michaelene Song, am acting as Location manager for [employer], PA-C I, Abby Potash, PA-C have reviewed above note and agree with its content

## 2019-01-09 ENCOUNTER — Telehealth (INDEPENDENT_AMBULATORY_CARE_PROVIDER_SITE_OTHER): Payer: 59 | Admitting: Physician Assistant

## 2019-01-09 ENCOUNTER — Encounter (INDEPENDENT_AMBULATORY_CARE_PROVIDER_SITE_OTHER): Payer: Self-pay | Admitting: Physician Assistant

## 2019-01-09 ENCOUNTER — Other Ambulatory Visit: Payer: Self-pay

## 2019-01-09 DIAGNOSIS — E669 Obesity, unspecified: Secondary | ICD-10-CM | POA: Diagnosis not present

## 2019-01-09 DIAGNOSIS — Z6832 Body mass index (BMI) 32.0-32.9, adult: Secondary | ICD-10-CM

## 2019-01-09 DIAGNOSIS — Z794 Long term (current) use of insulin: Secondary | ICD-10-CM | POA: Diagnosis not present

## 2019-01-09 DIAGNOSIS — E559 Vitamin D deficiency, unspecified: Secondary | ICD-10-CM | POA: Diagnosis not present

## 2019-01-09 DIAGNOSIS — E1165 Type 2 diabetes mellitus with hyperglycemia: Secondary | ICD-10-CM | POA: Diagnosis not present

## 2019-01-09 MED ORDER — VITAMIN D (ERGOCALCIFEROL) 1.25 MG (50000 UNIT) PO CAPS: 50000.0000 [IU] | ORAL_CAPSULE | ORAL | 0 refills | Status: DC

## 2019-01-10 MED FILL — ODEFSEY 200-25-25 MG TABS: 200-25-25 | 30 days supply | Qty: 30 | Fill #2

## 2019-01-10 MED FILL — VIT D2 1.25 MG (50,000 UNIT: 1.25 MG | 28 days supply | Qty: 4 | Fill #0

## 2019-01-11 NOTE — Progress Notes (Signed)
Office: [phone]  /  Fax: [phone] TeleHealth Visit:  [name] has verbally consented to this TeleHealth visit today. The patient is located at home, the provider is located at the [employer] [facility] office. The participants in this visit include the listed provider and patient. The visit was conducted today via face time.  HPI:   Chief Complaint: OBESITY [name] is here to discuss his progress with his obesity treatment plan. He is on the Category 3 plan and is following his eating plan approximately 70 % of the time. He states he is walking for 20 minutes 3 times per week. Hemi reports that he was on vacation and got a GI virus. He has not been able to eat on the plan. He is feeling better and ready to get back on track.  We were unable to weigh the patient today for this TeleHealth visit. He feels as if he has maintained his weight since his last visit. He has lost 3 lbs since starting treatment with [name].  Vitamin D Deficiency [name] has a diagnosis of vitamin D deficiency. He is currently taking prescription Vit D and denies nausea, vomiting or muscle weakness.  Diabetes II [name] has a diagnosis of diabetes type II. [name] states his BGs fasting average between 97 and 120's. He denies hypoglycemia. He is on Jardiance, Victoza, and insulin. No UTI and he denies nausea, vomiting, or diarrhea from his medications. Last A1c was 10.2. He has been working on intensive lifestyle modifications including diet, exercise, and weight loss to help control his blood glucose levels.  ASSESSMENT AND PLAN:  Vitamin D deficiency - Plan: Vitamin D, Ergocalciferol, (DRISDOL) 1.25 MG (50000 UT) CAPS capsule  Type 2 diabetes mellitus with hyperglycemia, with long-term current use of insulin (HCC)  Class 1 obesity with serious comorbidity and body mass index (BMI) of 32.0 to 32.9 in adult, unspecified obesity type  PLAN:  Vitamin D Deficiency [name] was informed that low vitamin D levels  contributes to fatigue and are associated with obesity, breast, and colon cancer. [name] agrees to continue taking prescription Vit D 50,000 IU every week #4 and we will refill for 1 month. He will follow up for routine testing of vitamin D, at least 2-3 times per year. He was informed of the risk of over-replacement of vitamin D and agrees to not increase his dose unless he discusses this with [name] first. [name] agrees to follow up with our clinic in 2 weeks.  Diabetes II [name] has been given extensive diabetes education by myself today including ideal fasting and post-prandial blood glucose readings, individual ideal Hgb A1c goals and hypoglycemia prevention. We discussed the importance of good blood sugar control to decrease the likelihood of diabetic complications such as nephropathy, neuropathy, limb loss, blindness, coronary artery disease, and death. We discussed the importance of intensive lifestyle modification including diet, exercise and weight loss as the first line treatment for diabetes. [name] agrees to continue his diabetes medications and will continue with exercise and weight loss. [name] agrees to follow up with our clinic in 2 weeks.  Obesity [name] is currently in the action stage of change. As such, his goal is to continue with weight loss efforts He has agreed to follow the Category 3 plan [name] has been instructed to work up to a goal of 150 minutes of combined cardio and strengthening exercise per week for weight loss and overall health benefits. We discussed the following Behavioral Modification Strategies today: increasing lean protein intake  and no skipping meals   [name] has agreed to follow up with our clinic in 2 weeks. He was informed of the importance of frequent follow up visits to maximize his success with intensive lifestyle modifications for his multiple health conditions.  ALLERGIES: Allergies  Allergen Reactions  . Pepcid [Famotidine] Other (See Comments)     Contraindicated with ODEFSEY  . Prilosec [Omeprazole] Other (See Comments)    Contraindicated with RPV (lowers level of this ARV in ODEFSEY    MEDICATIONS: Current Outpatient Medications on File Prior to Visit  Medication Sig Dispense Refill  . aspirin 81 MG tablet Take 1 tablet (81 mg total) by mouth every evening. 30 tablet   . chlorthalidone (HYGROTON) 25 MG tablet Take 1 tablet (25 mg total) by mouth daily. 90 tablet 3  . empagliflozin (JARDIANCE) 25 MG TABS tablet Take 25 mg by mouth daily. 30 tablet 0  . emtricitabine-rilpivir-tenofovir AF (ODEFSEY) 200-25-25 MG TABS tablet Take 1 tablet by mouth daily with breakfast. 30 tablet 11  . ezetimibe (ZETIA) 10 MG tablet Take 1 tablet (10 mg total) by mouth daily. 90 tablet 3  . insulin glargine (LANTUS SOLOSTAR) 100 UNIT/ML injection Inject 45 Units into the skin at bedtime.     . insulin lispro (HUMALOG) 100 UNIT/ML injection Inject 10-16 Units into the skin 3 (three) times daily before meals.    . Insulin Pen Needle (BD PEN NEEDLE NANO 2ND GEN) 32G X 4 MM MISC 1 Package by Does not apply route 2 (two) times daily. 100 each 0  . Insulin Pen Needle (BD PEN NEEDLE NANO U/F) 32G X 4 MM MISC 1 Device by Does not apply route daily. 100 each 0  . liraglutide (VICTOZA) 18 MG/3ML SOPN Inject 0.1 mLs (0.6 mg total) into the skin daily. 1 pen 0  . lisinopril (PRINIVIL,ZESTRIL) 10 MG tablet Take 1 tablet (10 mg total) by mouth daily. 90 tablet 3  . rosuvastatin (CRESTOR) 40 MG tablet Take 1 tablet (40 mg total) by mouth daily. 90 tablet 3   No current facility-administered medications on file prior to visit.     PAST MEDICAL HISTORY: Past Medical History:  Diagnosis Date  . CAD (coronary artery disease)    DES.  /  ... Later (10/2007) intervention for in-stent restenosis ;  EF 60%... catheterization... June, 2009  . Chest pain   . Diabetes mellitus type 2, uncontrolled ([place]) 09/27/2014  . Diabetes mellitus type II   . DKA (diabetic ketoacidoses)  ([place]) 12/2010; 03/23/2017  . Dyslipidemia   . Ejection fraction    EF 60%, catheterization, 2009  . Gallbladder problem   . History of heart attack   . History of kidney stones   . HIV (human immunodeficiency virus infection) ([place]) dx'd ~ 2002/2003  . HLD (hyperlipidemia)   . HTN (hypertension)   . 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  . Nausea and vomiting 03/23/2017  . 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 RPR  reverted to negative  . Sinus tachycardia    mild at rest.... October, 2010  . Syphilitic hepatitis     PAST SURGICAL HISTORY: Past Surgical History:  Procedure Laterality Date  . CHOLECYSTECTOMY OPEN  07/2001  . COLONOSCOPY N/A 05/28/2017   Procedure: COLONOSCOPY;  Surgeon: Rogene Houston, MD;  [facility];  Service: Endoscopy;  Laterality: N/A;  200 - pt to prep in Endo  . CORONARY ANGIOPLASTY WITH  STENT PLACEMENT  03/2006; 10/2007;   . HERNIA REPAIR    . LAPAROSCOPIC INCISIONAL / UMBILICAL / VENTRAL HERNIA REPAIR  07/2001   UHR  . POLYPECTOMY  05/28/2017   Procedure: POLYPECTOMY;  Surgeon: Rogene Houston, MD;  [facility];  Service: Endoscopy;;  colon    SOCIAL HISTORY: Social History   Tobacco Use  . Smoking status: Never Smoker  . Smokeless tobacco: Never Used  Substance Use Topics  . Alcohol use: Yes    Alcohol/week: 0.0 - 1.0 standard drinks    Comment: 03/23/2017 "might have a drink 4-6 times/year"  . Drug use: No    FAMILY HISTORY: Family History  Problem Relation Age of Onset  . Heart attack Father        7 heart attacks  . CAD Father   . Alcoholism Father   . Hyperlipidemia Mother   . Hypertension Mother   . Anemia Other   . Diabetes Other   . CAD Sister   . Diabetes Sister     ROS: Review of Systems  Constitutional: Negative for weight loss.  Gastrointestinal: Negative for diarrhea, nausea and  vomiting.  Musculoskeletal:       Negative muscle weakness  Endo/Heme/Allergies:       Negative hypoglycemia    PHYSICAL EXAM: Pt in no acute distress  RECENT LABS AND TESTS: BMET    Component Value Date/Time   NA 141 07/18/2018 1023   K 4.2 07/18/2018 1023   CL 99 07/18/2018 1023   CO2 24 07/18/2018 1023   GLUCOSE 135 (H) 07/18/2018 1023   GLUCOSE 149 (H) 02/22/2018 1025   BUN 12 07/18/2018 1023   CREATININE 0.72 (L) 07/18/2018 1023   CREATININE 0.82 02/22/2018 1025   CALCIUM 9.4 07/18/2018 1023   GFRNONAA 107 07/18/2018 1023   GFRNONAA 102 02/22/2018 1025   GFRAA 123 07/18/2018 1023   GFRAA 118 02/22/2018 1025   Lab Results  Component Value Date   HGBA1C 10.2 (H) 07/18/2018   HGBA1C 10.4 (H) 03/14/2018   HGBA1C 9.8 (A) 12/13/2017   HGBA1C 10.6 (H) 03/23/2017   HGBA1C 8.5 03/30/2009   No results found for: INSULIN CBC    Component Value Date/Time   WBC 7.3 02/22/2018 1025   RBC 5.39 02/22/2018 1025   HGB 15.4 02/22/2018 1025   HCT 44.7 02/22/2018 1025   PLT 286 02/22/2018 1025   MCV 82.9 02/22/2018 1025   MCH 28.6 02/22/2018 1025   MCHC 34.5 02/22/2018 1025   RDW 12.8 02/22/2018 1025   LYMPHSABS 2,949 02/22/2018 1025   MONOABS 0.3 03/23/2017 1524   EOSABS 241 02/22/2018 1025   BASOSABS 51 02/22/2018 1025   Iron/TIBC/Ferritin/ %Sat No results found for: IRON, TIBC, FERRITIN, IRONPCTSAT Lipid Panel     Component Value Date/Time   CHOL 187 07/18/2018 1023   TRIG 216 (H) 07/18/2018 1023   HDL 41 07/18/2018 1023   CHOLHDL 4.2 02/22/2018 1025   VLDL 59 (H) 10/28/2015 0957   LDLCALC 103 (H) 07/18/2018 1023   LDLCALC 66 02/22/2018 1025   Hepatic Function Panel     Component Value Date/Time   PROT 6.6 07/18/2018 1023   ALBUMIN 4.2 07/18/2018 1023   AST 24 07/18/2018 1023   ALT 29 07/18/2018 1023   ALKPHOS 56 07/18/2018 1023   BILITOT 0.3 07/18/2018 1023       I, Trixie Dredge, am acting as transcriptionist for Abby Potash, PA-C I, Abby Potash, PA-C have reviewed above note and agree  with its content

## 2019-01-13 MED FILL — ROSUVASTATIN CALCIUM 40 MG: 40 | 90 days supply | Qty: 90 | Fill #1

## 2019-01-23 ENCOUNTER — Other Ambulatory Visit: Payer: Self-pay

## 2019-01-23 ENCOUNTER — Telehealth (INDEPENDENT_AMBULATORY_CARE_PROVIDER_SITE_OTHER): Payer: 59 | Admitting: Physician Assistant

## 2019-01-23 ENCOUNTER — Encounter (INDEPENDENT_AMBULATORY_CARE_PROVIDER_SITE_OTHER): Payer: Self-pay | Admitting: Physician Assistant

## 2019-01-23 DIAGNOSIS — E669 Obesity, unspecified: Secondary | ICD-10-CM | POA: Diagnosis not present

## 2019-01-23 DIAGNOSIS — Z794 Long term (current) use of insulin: Secondary | ICD-10-CM | POA: Diagnosis not present

## 2019-01-23 DIAGNOSIS — I10 Essential (primary) hypertension: Secondary | ICD-10-CM | POA: Diagnosis not present

## 2019-01-23 DIAGNOSIS — E1029 Type 1 diabetes mellitus with other diabetic kidney complication: Secondary | ICD-10-CM | POA: Diagnosis not present

## 2019-01-23 DIAGNOSIS — E785 Hyperlipidemia, unspecified: Secondary | ICD-10-CM | POA: Diagnosis not present

## 2019-01-23 DIAGNOSIS — Z6832 Body mass index (BMI) 32.0-32.9, adult: Secondary | ICD-10-CM | POA: Diagnosis not present

## 2019-01-23 DIAGNOSIS — E119 Type 2 diabetes mellitus without complications: Secondary | ICD-10-CM | POA: Diagnosis not present

## 2019-01-23 DIAGNOSIS — B20 Human immunodeficiency virus [HIV] disease: Secondary | ICD-10-CM | POA: Diagnosis not present

## 2019-01-23 DIAGNOSIS — I2510 Atherosclerotic heart disease of native coronary artery without angina pectoris: Secondary | ICD-10-CM | POA: Diagnosis not present

## 2019-01-24 NOTE — Progress Notes (Signed)
Office: [phone]  /  Fax: [phone] TeleHealth Visit:  [name] has verbally consented to this TeleHealth visit today. The patient is located at home, the provider is located at the [facility] office. The participants in this visit include the listed provider and patient and any and all parties involved. The visit was conducted today via FaceTime.  HPI:   Chief Complaint: OBESITY [name] is here to discuss his progress with his obesity treatment plan. He is on the Category 3 plan and is following his eating plan approximately 60 % of the time. He states he is walking 60 minutes 3 to 4 times per week. [name] reports that he has been packing his food for work, but he doesn't always eat all of his food when he gets home because he doesn't feel like cooking dinner. We were unable to weigh the patient today for this TeleHealth visit. He feels as if he has maintained weight (weight not reported) since his last visit. He has lost 3 lbs since starting treatment with [name].  Diabetes II [name] has a diagnosis of diabetes type II. [name] states fasting BGs are in the 120's and he denies any hypoglycemic episodes. [name] is on Jardiance, Victoza and insulin. He has been working on intensive lifestyle modifications including diet, exercise, and weight loss to help control his blood glucose levels.  ASSESSMENT AND PLAN:  Type 2 diabetes mellitus without complication, with long-term current use of insulin (HCC)  Class 1 obesity with serious comorbidity and body mass index (BMI) of 32.0 to 32.9 in adult, unspecified obesity type  PLAN:  Diabetes II [name] has been given extensive diabetes education by myself today including ideal fasting and post-prandial blood glucose readings, individual ideal Hgb A1c goals and hypoglycemia prevention. We discussed the importance of good blood sugar control to decrease the likelihood of diabetic complications such as nephropathy, neuropathy, limb  loss, blindness, coronary artery disease, and death. We discussed the importance of intensive lifestyle modification including diet, exercise and weight loss as the first line treatment for diabetes. [name] agrees to continue his diabetes medications and weight loss, and he will follow up at the agreed upon time.  Obesity [name] is currently in the action stage of change. As such, his goal is to continue with weight loss efforts He has agreed to follow the Category 3 plan [name] has been instructed to work up to a goal of 150 minutes of combined cardio and strengthening exercise per week for weight loss and overall health benefits. We discussed the following Behavioral Modification Strategies today: increasing lean protein intake and work on meal planning and easy cooking plans  [name] has agreed to follow up with our clinic in 2 weeks. He was informed of the importance of frequent follow up visits to maximize his success with intensive lifestyle modifications for his multiple health conditions.  I spent > than 50% of the 25 minute visit on counseling as documented in the note.    ALLERGIES: Allergies  Allergen Reactions   Pepcid [Famotidine] Other (See Comments)    Contraindicated with ODEFSEY   Prilosec [Omeprazole] Other (See Comments)    Contraindicated with RPV (lowers level of this ARV in ODEFSEY    MEDICATIONS: Current Outpatient Medications on File Prior to Visit  Medication Sig Dispense Refill   aspirin 81 MG tablet Take 1 tablet (81 mg total) by mouth every evening. 30 tablet    chlorthalidone (HYGROTON) 25 MG tablet Take 1 tablet (25 mg total)  by mouth daily. 90 tablet 3   empagliflozin (JARDIANCE) 25 MG TABS tablet Take 25 mg by mouth daily. 30 tablet 0   emtricitabine-rilpivir-tenofovir AF (ODEFSEY) 200-25-25 MG TABS tablet Take 1 tablet by mouth daily with breakfast. 30 tablet 11   ezetimibe (ZETIA) 10 MG tablet Take 1 tablet (10 mg total) by mouth daily. 90 tablet 3     insulin glargine (LANTUS SOLOSTAR) 100 UNIT/ML injection Inject 45 Units into the skin at bedtime.      insulin lispro (HUMALOG) 100 UNIT/ML injection Inject 10-16 Units into the skin 3 (three) times daily before meals.     Insulin Pen Needle (BD PEN NEEDLE NANO 2ND GEN) 32G X 4 MM MISC 1 Package by Does not apply route 2 (two) times daily. 100 each 0   Insulin Pen Needle (BD PEN NEEDLE NANO U/F) 32G X 4 MM MISC 1 Device by Does not apply route daily. 100 each 0   liraglutide (VICTOZA) 18 MG/3ML SOPN Inject 0.1 mLs (0.6 mg total) into the skin daily. 1 pen 0   lisinopril (PRINIVIL,ZESTRIL) 10 MG tablet Take 1 tablet (10 mg total) by mouth daily. 90 tablet 3   rosuvastatin (CRESTOR) 40 MG tablet Take 1 tablet (40 mg total) by mouth daily. 90 tablet 3   Vitamin D, Ergocalciferol, (DRISDOL) 1.25 MG (50000 UT) CAPS capsule Take 1 capsule (50,000 Units total) by mouth every 7 (seven) days. 4 capsule 0   No current facility-administered medications on file prior to visit.     PAST MEDICAL HISTORY: Past Medical History:  Diagnosis Date   CAD (coronary artery disease)    DES.  /  ... Later (10/2007) intervention for in-stent restenosis ;  EF 60%... catheterization... 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 October, 2010   Syphilitic hepatitis     PAST SURGICAL HISTORY: Past Surgical History:  Procedure Laterality Date   CHOLECYSTECTOMY OPEN  07/2001   COLONOSCOPY N/A 05/28/2017   Procedure: COLONOSCOPY;  Surgeon: Rogene Houston, MD;  [facility];  Service: Endoscopy;  Laterality: N/A;  200 - pt to prep in Endo   CORONARY ANGIOPLASTY WITH STENT PLACEMENT  03/2006; 10/2007;    HERNIA REPAIR     LAPAROSCOPIC INCISIONAL / UMBILICAL / [place]  07/2001   UHR   POLYPECTOMY  05/28/2017   Procedure: POLYPECTOMY;  Surgeon: Rogene Houston, MD;  Location: [facility];  Service: Endoscopy;;  colon    SOCIAL HISTORY: Social History   Tobacco Use   Smoking status: Never Smoker   Smokeless tobacco: Never Used  Substance Use Topics   Alcohol use: Yes    Alcohol/week: 0.0 - 1.0 standard drinks    Comment: 03/23/2017 "might have a drink  4-6 times/year"   Drug use: No    FAMILY HISTORY: Family History  Problem Relation Age of Onset   Heart attack Father        7 heart attacks   CAD Father    Alcoholism Father    Hyperlipidemia Mother    Hypertension Mother    Anemia Other    Diabetes Other    CAD Sister    Diabetes Sister     ROS: Review of Systems  Constitutional: Negative for weight loss.  Endo/Heme/Allergies:       Negative for hypoglycemia    PHYSICAL EXAM: Pt in no acute distress  RECENT LABS AND TESTS: BMET    Component Value Date/Time   NA 141 07/18/2018 1023   K 4.2 07/18/2018 1023   CL 99 07/18/2018 1023   CO2 24 07/18/2018 1023   GLUCOSE 135 (H) 07/18/2018 1023   GLUCOSE 149 (H) 02/22/2018 1025   BUN 12 07/18/2018 1023   CREATININE 0.72 (L) 07/18/2018 1023   CREATININE 0.82 02/22/2018 1025   CALCIUM 9.4 07/18/2018 1023   GFRNONAA 107 07/18/2018 1023   GFRNONAA 102 02/22/2018 1025   GFRAA 123 07/18/2018 1023   GFRAA 118 02/22/2018 1025   Lab Results  Component Value Date   HGBA1C 10.2 (H)  07/18/2018   HGBA1C 10.4 (H) 03/14/2018   HGBA1C 9.8 (A) 12/13/2017   HGBA1C 10.6 (H) 03/23/2017   HGBA1C 8.5 03/30/2009   No results found for: INSULIN CBC    Component Value Date/Time   WBC 7.3 02/22/2018 1025   RBC 5.39 02/22/2018 1025   HGB 15.4 02/22/2018 1025   HCT 44.7 02/22/2018 1025   PLT 286 02/22/2018 1025   MCV 82.9 02/22/2018 1025   MCH 28.6 02/22/2018 1025   MCHC 34.5 02/22/2018 1025   RDW 12.8 02/22/2018 1025   LYMPHSABS 2,949 02/22/2018 1025   MONOABS 0.3 03/23/2017 1524   EOSABS 241 02/22/2018 1025   BASOSABS 51 02/22/2018 1025   Iron/TIBC/Ferritin/ %Sat No results found for: IRON, TIBC, FERRITIN, IRONPCTSAT Lipid Panel     Component Value Date/Time   CHOL 187 07/18/2018 1023   TRIG 216 (H) 07/18/2018 1023   HDL 41 07/18/2018 1023   CHOLHDL 4.2 02/22/2018 1025   VLDL 59 (H) 10/28/2015 0957   LDLCALC 103 (H) 07/18/2018 1023   LDLCALC 66 02/22/2018 1025   Hepatic Function Panel     Component Value Date/Time   PROT 6.6 07/18/2018 1023   ALBUMIN 4.2 07/18/2018 1023   AST 24 07/18/2018 1023   ALT 29 07/18/2018 1023   ALKPHOS 56 07/18/2018 1023   BILITOT 0.3 07/18/2018 1023      Ref. Range 07/18/2018 10:23  Vitamin D, 25-Hydroxy Latest Ref Range: 30.0 - 100.0 ng/mL 27.3 (L)    I, Doreene Nest, am acting as transcriptionist for Abby Potash, PA-C I, Abby Potash, PA-C have reviewed above note and agree with its content

## 2019-01-26 ENCOUNTER — Encounter (INDEPENDENT_AMBULATORY_CARE_PROVIDER_SITE_OTHER): Payer: Self-pay

## 2019-02-01 MED FILL — FREESTYLE LIBRE 14 DAY SENS: 84 days supply | Qty: 6 | Fill #2

## 2019-02-06 ENCOUNTER — Ambulatory Visit (INDEPENDENT_AMBULATORY_CARE_PROVIDER_SITE_OTHER): Payer: 59 | Admitting: Physician Assistant

## 2019-02-08 ENCOUNTER — Other Ambulatory Visit: Payer: Self-pay | Admitting: Infectious Disease

## 2019-02-08 DIAGNOSIS — B20 Human immunodeficiency virus [HIV] disease: Secondary | ICD-10-CM

## 2019-02-13 ENCOUNTER — Encounter (INDEPENDENT_AMBULATORY_CARE_PROVIDER_SITE_OTHER): Payer: Self-pay | Admitting: Physician Assistant

## 2019-02-13 ENCOUNTER — Ambulatory Visit (INDEPENDENT_AMBULATORY_CARE_PROVIDER_SITE_OTHER): Payer: 59 | Admitting: Physician Assistant

## 2019-02-13 ENCOUNTER — Other Ambulatory Visit: Payer: Self-pay

## 2019-02-13 VITALS — BP 152/76 | HR 98 | Temp 98.2°F | Ht 66.0 in | Wt 193.0 lb

## 2019-02-13 DIAGNOSIS — Z6831 Body mass index (BMI) 31.0-31.9, adult: Secondary | ICD-10-CM | POA: Diagnosis not present

## 2019-02-13 DIAGNOSIS — E119 Type 2 diabetes mellitus without complications: Secondary | ICD-10-CM | POA: Diagnosis not present

## 2019-02-13 DIAGNOSIS — Z794 Long term (current) use of insulin: Secondary | ICD-10-CM | POA: Diagnosis not present

## 2019-02-13 DIAGNOSIS — Z9189 Other specified personal risk factors, not elsewhere classified: Secondary | ICD-10-CM

## 2019-02-13 DIAGNOSIS — E7849 Other hyperlipidemia: Secondary | ICD-10-CM | POA: Diagnosis not present

## 2019-02-13 DIAGNOSIS — E559 Vitamin D deficiency, unspecified: Secondary | ICD-10-CM

## 2019-02-13 DIAGNOSIS — R5383 Other fatigue: Secondary | ICD-10-CM | POA: Diagnosis not present

## 2019-02-13 DIAGNOSIS — E669 Obesity, unspecified: Secondary | ICD-10-CM

## 2019-02-13 MED ORDER — VITAMIN D (ERGOCALCIFEROL) 1.25 MG (50000 UNIT) PO CAPS: 50000.0000 [IU] | ORAL_CAPSULE | ORAL | 0 refills | Status: DC

## 2019-02-13 MED ORDER — LIRAGLUTIDE 18 MG/3ML ~~LOC~~ SOPN: 1.2000 mg | PEN_INJECTOR | Freq: Every morning | SUBCUTANEOUS | 0 refills | Status: DC

## 2019-02-13 MED FILL — ODEFSEY 200-25-25 MG TABS: 200-25-25 | 30 days supply | Qty: 30 | Fill #0

## 2019-02-13 MED FILL — VICTOZA 18 MG/3 ML INJECT P: 18 | 28 days supply | Qty: 6 | Fill #0

## 2019-02-13 MED FILL — VIT D2 1.25 MG (50,000 UNIT: 1.25 MG | 28 days supply | Qty: 4 | Fill #0

## 2019-02-14 LAB — CBC WITH DIFFERENTIAL/PLATELET
Basophils Absolute: 0.1 10*3/uL (ref 0.0–0.2)
Basos: 1 %
EOS (ABSOLUTE): 0.1 10*3/uL (ref 0.0–0.4)
Eos: 2 %
Hematocrit: 47.1 % (ref 37.5–51.0)
Hemoglobin: 15.7 g/dL (ref 13.0–17.7)
Immature Grans (Abs): 0 10*3/uL (ref 0.0–0.1)
Immature Granulocytes: 0 %
Lymphocytes Absolute: 2 10*3/uL (ref 0.7–3.1)
Lymphs: 30 %
MCH: 28 pg (ref 26.6–33.0)
MCHC: 33.3 g/dL (ref 31.5–35.7)
MCV: 84 fL (ref 79–97)
Monocytes Absolute: 0.6 10*3/uL (ref 0.1–0.9)
Monocytes: 9 %
Neutrophils Absolute: 3.9 10*3/uL (ref 1.4–7.0)
Neutrophils: 58 %
Platelets: 298 10*3/uL (ref 150–450)
RBC: 5.6 x10E6/uL (ref 4.14–5.80)
RDW: 13.6 % (ref 11.6–15.4)
WBC: 6.6 10*3/uL (ref 3.4–10.8)

## 2019-02-14 LAB — T3: T3, Total: 119 ng/dL (ref 71–180)

## 2019-02-14 LAB — LIPID PANEL WITH LDL/HDL RATIO
Cholesterol, Total: 187 mg/dL (ref 100–199)
HDL: 35 mg/dL — ABNORMAL LOW (ref >39)
LDL Chol Calc (NIH): 83 mg/dL (ref 0–99)
LDL/HDL Ratio: 2.4 ratio (ref 0.0–3.6)
Triglycerides: 423 mg/dL — ABNORMAL HIGH (ref 0–149)
VLDL Cholesterol Cal: 69 mg/dL — ABNORMAL HIGH (ref 5–40)

## 2019-02-14 LAB — COMPREHENSIVE METABOLIC PANEL
ALT: 18 IU/L (ref 0–44)
AST: 15 IU/L (ref 0–40)
Albumin/Globulin Ratio: 1.7 (ref 1.2–2.2)
Albumin: 4.3 g/dL (ref 3.8–4.9)
Alkaline Phosphatase: 67 IU/L (ref 39–117)
BUN/Creatinine Ratio: 23 — ABNORMAL HIGH (ref 9–20)
BUN: 19 mg/dL (ref 6–24)
Bilirubin Total: <0.2 mg/dL (ref 0.0–1.2)
CO2: 21 mmol/L (ref 20–29)
Calcium: 10 mg/dL (ref 8.7–10.2)
Chloride: 101 mmol/L (ref 96–106)
Creatinine, Ser: 0.82 mg/dL (ref 0.76–1.27)
GFR calc Af Amer: 117 mL/min/{1.73_m2} (ref >59)
GFR calc non Af Amer: 101 mL/min/{1.73_m2} (ref >59)
Globulin, Total: 2.5 g/dL (ref 1.5–4.5)
Glucose: 101 mg/dL — ABNORMAL HIGH (ref 65–99)
Potassium: 3.9 mmol/L (ref 3.5–5.2)
Sodium: 140 mmol/L (ref 134–144)
Total Protein: 6.8 g/dL (ref 6.0–8.5)

## 2019-02-14 LAB — VITAMIN D 25 HYDROXY (VIT D DEFICIENCY, FRACTURES): Vit D, 25-Hydroxy: 26.4 ng/mL — ABNORMAL LOW (ref 30.0–100.0)

## 2019-02-14 LAB — T4, FREE: Free T4: 1.07 ng/dL (ref 0.82–1.77)

## 2019-02-14 LAB — HEMOGLOBIN A1C
Est. average glucose Bld gHb Est-mCnc: 214 mg/dL
Hgb A1c MFr Bld: 9.1 % — ABNORMAL HIGH (ref 4.8–5.6)

## 2019-02-14 LAB — TSH: TSH: 1.24 u[IU]/mL (ref 0.450–4.500)

## 2019-02-14 MED FILL — LANTUS SOLOSTAR 100 UNITS/M: 100 | 14 days supply | Qty: 15 | Fill #0

## 2019-02-15 NOTE — Progress Notes (Signed)
Office: [phone]  /  Fax: [phone]   HPI:   Chief Complaint: OBESITY [name] is here to discuss his progress with his obesity treatment plan. He is on the Category 3 plan and is following his eating plan approximately 50 % of the time. He states he is exercising 0 minutes 0 times per week. Zarion reports that he has done a better job with getting his protein in at dinner. He is meal planning for the nights he works.  His weight is 193 lb (87.5 kg) today and has had a weight loss of 3 pounds since his last in-office visit. He has lost 6 lbs since starting treatment with [name].  Vitamin D deficiency Geovanny has a diagnosis of vitamin D deficiency. Chueyee is currently taking vit D and he denies nausea, vomiting or muscle weakness.  At risk for osteopenia and osteoporosis Alfard is at higher risk of osteopenia and osteoporosis due to vitamin D deficiency.   Hyperlipidemia Calin has hyperlipidemia and he is on Crestor and Zetia. He has been trying to improve his cholesterol levels with intensive lifestyle modification including a low saturated fat diet, exercise and weight loss. He denies any chest pain.  Diabetes II without complications, with long term use of insulin Benjaman has a diagnosis of diabetes type II and he is on Victoza and Insulin. Santi states fasting BGs are between 74 and 150, but average between 110 and 130. He has been working on intensive lifestyle modifications including diet, exercise, and weight loss to help control his blood glucose levels.  Fatigue Kebin has excessive generalized fatigue.   ASSESSMENT AND PLAN:  Vitamin D deficiency - Plan: VITAMIN D 25 Hydroxy (Vit-D Deficiency, Fractures), Vitamin D, Ergocalciferol, (DRISDOL) 1.25 MG (50000 UT) CAPS capsule  Other hyperlipidemia - Plan: Lipid Panel With LDL/HDL Ratio  Type 2 diabetes mellitus without complication, with long-term current use of insulin (HCC) - Plan: Hemoglobin A1c, Comprehensive metabolic panel,  liraglutide (VICTOZA) 18 MG/3ML SOPN  Other fatigue - Plan: T3, T4, free, TSH, CBC with Differential/Platelet  At risk for osteoporosis  Class 1 obesity with serious comorbidity and body mass index (BMI) of 31.0 to 31.9 in adult, unspecified obesity type  PLAN:  Vitamin D Deficiency Juda was informed that low vitamin D levels contributes to fatigue and are associated with obesity, breast, and colon cancer. Panayiotis agrees to continue to take prescription Vit D @50 ,000 IU every week #4 with no refills and he will follow up for routine testing of vitamin D, at least 2-3 times per year. He was informed of the risk of over-replacement of vitamin D and agrees to not increase his dose unless he discusses this with [name] first. We will check labs and Narciso agrees to follow up with our clinic in 2 weeks.  At risk for osteopenia and osteoporosis Saint was given extended  (15 minutes) osteoporosis prevention counseling today. Keywan is at risk for osteopenia and osteoporosis due to his vitamin D deficiency. He was encouraged to take his vitamin D and follow his higher calcium diet and increase strengthening exercise to help strengthen his bones and decrease his risk of osteopenia and osteoporosis.  Hyperlipidemia Sofia was informed of the American Heart Association Guidelines emphasizing intensive lifestyle modifications as the first line treatment for hyperlipidemia. We discussed many lifestyle modifications today in depth, and Aleph will continue to work on decreasing saturated fats such as fatty red meat, butter and many fried foods. He will also increase vegetables and lean protein in his  diet and continue to work on exercise and weight loss efforts. We will check labs today. Jheremy will continue with Crestor and Zetia and follow up as directed.  Diabetes II without complications, with long term use of insulin Kwasi has been given extensive diabetes education by myself today including ideal fasting and  post-prandial blood glucose readings, individual ideal Hgb A1c goals and hypoglycemia prevention. We discussed the importance of good blood sugar control to decrease the likelihood of diabetic complications such as nephropathy, neuropathy, limb loss, blindness, coronary artery disease, and death. We discussed the importance of intensive lifestyle modification including diet, exercise and weight loss as the first line treatment for diabetes. Ronie will continue his diabetes medications and weight loss. He agrees to increase Victoza to 1.2 mg daily #2 pens with no refills. We will check labs today and Joaopedro agrees to follow up with our clinic in 2 weeks.  Fatigue Offie was informed that his fatigue may be related to obesity, depression or many other causes. Thyroid panel and CBC will be ordered today, and in the meanwhile [name] has agreed to continue to work on diet, exercise and weight loss to help with fatigue.   Obesity Jaxx is currently in the action stage of change. As such, his goal is to continue with weight loss efforts He has agreed to follow the Category 3 plan Yahel has been instructed to work up to a goal of 150 minutes of combined cardio and strengthening exercise per week for weight loss and overall health benefits. We discussed the following Behavioral Modification Strategies today: increasing lean protein intake and work on meal planning and easy cooking plans   Paige has agreed to follow up with our clinic in 2 weeks. He was informed of the importance of frequent follow up visits to maximize his success with intensive lifestyle modifications for his multiple health conditions.  ALLERGIES: Allergies  Allergen Reactions   Pepcid [Famotidine] Other (See Comments)    Contraindicated with ODEFSEY   Prilosec [Omeprazole] Other (See Comments)    Contraindicated with RPV (lowers level of this ARV in ODEFSEY    MEDICATIONS: Current Outpatient Medications on File Prior to Visit    Medication Sig Dispense Refill   aspirin 81 MG tablet Take 1 tablet (81 mg total) by mouth every evening. 30 tablet    chlorthalidone (HYGROTON) 25 MG tablet Take 1 tablet (25 mg total) by mouth daily. 90 tablet 3   empagliflozin (JARDIANCE) 25 MG TABS tablet Take 25 mg by mouth daily. 30 tablet 0   ezetimibe (ZETIA) 10 MG tablet Take 1 tablet (10 mg total) by mouth daily. 90 tablet 3   insulin glargine (LANTUS SOLOSTAR) 100 UNIT/ML injection Inject 45 Units into the skin at bedtime.      insulin lispro (HUMALOG) 100 UNIT/ML injection Inject 10-16 Units into the skin 3 (three) times daily before meals.     Insulin Pen Needle (BD PEN NEEDLE NANO 2ND GEN) 32G X 4 MM MISC 1 Package by Does not apply route 2 (two) times daily. 100 each 0   Insulin Pen Needle (BD PEN NEEDLE NANO U/F) 32G X 4 MM MISC 1 Device by Does not apply route daily. 100 each 0   lisinopril (PRINIVIL,ZESTRIL) 10 MG tablet Take 1 tablet (10 mg total) by mouth daily. 90 tablet 3   ODEFSEY 200-25-25 MG TABS tablet TAKE 1 TABLET BY MOUTH DAILY WITH BREAKFAST. PLEASE CALL TO SCHEDULE YOUR FOLLOW UP [phone] 30 tablet 3   rosuvastatin (CRESTOR)  40 MG tablet Take 1 tablet (40 mg total) by mouth daily. 90 tablet 3   No current facility-administered medications on file prior to visit.     PAST MEDICAL HISTORY: Past Medical History:  Diagnosis Date   CAD (coronary artery disease)    DES.  /  ... Later (10/2007) intervention for in-stent restenosis ;  EF 60%... catheterization... 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 October, 2010   Syphilitic hepatitis     PAST SURGICAL HISTORY: Past Surgical History:  Procedure Laterality Date   CHOLECYSTECTOMY OPEN  07/2001   COLONOSCOPY N/A 05/28/2017   Procedure: COLONOSCOPY;  Surgeon: Rogene Houston, MD;  [facility];  Service: Endoscopy;  Laterality: N/A;  200 - pt to prep in Endo   CORONARY ANGIOPLASTY WITH STENT PLACEMENT  03/2006; 10/2007;    HERNIA REPAIR     LAPAROSCOPIC INCISIONAL / UMBILICAL / [place]  07/2001   UHR   POLYPECTOMY  05/28/2017   Procedure: POLYPECTOMY;  Surgeon: Rogene Houston, MD;  [facility];  Service: Endoscopy;;  colon    SOCIAL HISTORY: Social History   Tobacco Use   Smoking status: Never Smoker   Smokeless tobacco: Never Used  Substance Use Topics   Alcohol use: Yes    Alcohol/week: 0.0 - 1.0 standard drinks    Comment: 03/23/2017 "might have a drink 4-6 times/year"   Drug use: No    FAMILY HISTORY: Family History  Problem Relation Age of Onset   Heart attack Father        7 heart attacks   CAD Father    Alcoholism Father    Hyperlipidemia Mother    Hypertension Mother    Anemia Other    Diabetes Other    CAD Sister    Diabetes Sister     ROS: Review of Systems  Constitutional: Positive for malaise/fatigue and weight loss.  Cardiovascular: Negative for chest pain.  Gastrointestinal: Negative for nausea and vomiting.  Musculoskeletal:       Negative for muscle weakness    PHYSICAL EXAM: Blood pressure (!) 152/76, pulse 98, temperature 98.2 F (36.8 C), temperature source Oral, height 5\' 6"  (1.676 m), weight 193 lb (87.5 kg), SpO2 96 %. Body mass index is  31.15 kg/m. Physical Exam Vitals signs reviewed.  Constitutional:      Appearance: Normal appearance. He is well-developed. He is obese.  Cardiovascular:     Rate and Rhythm: Normal rate.  Pulmonary:     Effort: Pulmonary effort is normal.  Musculoskeletal: Normal range of motion.  Skin:    General: Skin is warm and dry.  Neurological:     Mental Status: He is alert and oriented to person, place, and time.  Psychiatric:        Mood and Affect: Mood normal.        Behavior: Behavior normal.     RECENT LABS AND TESTS: BMET    Component Value Date/Time   NA 140 02/13/2019 1045   K 3.9 02/13/2019 1045   CL 101 02/13/2019 1045   CO2 21 02/13/2019 1045   GLUCOSE 101 (H) 02/13/2019 1045   GLUCOSE 149 (H) 02/22/2018 1025   BUN 19 02/13/2019 1045   CREATININE 0.82 02/13/2019 1045   CREATININE 0.82 02/22/2018 1025   CALCIUM 10.0 02/13/2019 1045   GFRNONAA 101 02/13/2019 1045   GFRNONAA 102 02/22/2018 1025   GFRAA 117 02/13/2019 1045   GFRAA 118 02/22/2018 1025   Lab Results  Component Value Date   HGBA1C 9.1 (H) 02/13/2019   HGBA1C 10.2 (H) 07/18/2018   HGBA1C 10.4 (H) 03/14/2018   HGBA1C 9.8 (A) 12/13/2017   HGBA1C 10.6 (H) 03/23/2017   No results found for: INSULIN CBC    Component Value Date/Time   WBC 6.6 02/13/2019 1045   WBC 7.3 02/22/2018 1025   RBC 5.60 02/13/2019 1045   RBC 5.39 02/22/2018 1025   HGB 15.7 02/13/2019 1045   HCT 47.1 02/13/2019 1045   PLT 298 02/13/2019 1045   MCV 84 02/13/2019 1045   MCH 28.0 02/13/2019 1045   MCH 28.6 02/22/2018 1025   MCHC 33.3 02/13/2019 1045   MCHC 34.5 02/22/2018 1025   RDW 13.6 02/13/2019 1045   LYMPHSABS 2.0 02/13/2019 1045   MONOABS 0.3 03/23/2017 1524   EOSABS 0.1 02/13/2019 1045   BASOSABS 0.1 02/13/2019 1045   Iron/TIBC/Ferritin/ %Sat No results found for: IRON, TIBC, FERRITIN, IRONPCTSAT Lipid Panel     Component Value Date/Time   CHOL 187 02/13/2019 1045   TRIG 423 (H) 02/13/2019 1045   HDL 35  (L) 02/13/2019 1045   CHOLHDL 4.2 02/22/2018 1025   VLDL 59 (H) 10/28/2015 0957   LDLCALC 103 (H) 07/18/2018 1023   LDLCALC 66 02/22/2018 1025   Hepatic Function Panel     Component Value Date/Time   PROT 6.8 02/13/2019 1045   ALBUMIN 4.3 02/13/2019 1045   AST 15 02/13/2019 1045   ALT 18 02/13/2019 1045   ALKPHOS 67 02/13/2019 1045   BILITOT <0.2 02/13/2019 1045      Ref. Range 07/18/2018 10:23  Vitamin D, 25-Hydroxy Latest Ref Range: 30.0 - 100.0 ng/mL 27.3 (L)    OBESITY BEHAVIORAL INTERVENTION VISIT  Today's visit was # 20   Starting weight: 199 lbs Starting date: 03/14/2018 Today's weight : 193 lbs Today's date: 02/13/2019 Total lbs lost to date: 6    02/13/2019  Height 5\' 6"  (1.676 m)  Weight 193 lb (87.5 kg)  BMI (Calculated) 31.17  BLOOD PRESSURE - SYSTOLIC 0000000  BLOOD PRESSURE - DIASTOLIC 76   Body Fat % [id number] %  Total Body Water (lbs) 94.6 lbs    ASK: We discussed the diagnosis of obesity with [name] today and [place] agreed to give [name] permission to discuss obesity behavioral modification therapy today.  ASSESS: Demosthenes has the diagnosis of obesity and his BMI today is 31.17 Jonathandavid is in the action stage of change   ADVISE: Merlin was educated on the multiple health risks of obesity as well as the benefit of weight loss to improve his health. He was advised of the need for long term treatment and the importance of lifestyle modifications to improve his current health and to decrease his risk  of future health problems.  AGREE: Multiple dietary modification options and treatment options were discussed and  Fermon agreed to follow the recommendations documented in the above note.  ARRANGE: Richad was educated on the importance of frequent visits to treat obesity as outlined per CMS and USPSTF guidelines and agreed to schedule his next follow up appointment today.  Corey Skains, am acting as transcriptionist for Abby Potash, PA-C I, Abby Potash,  PA-C have reviewed above note and agree with its content

## 2019-02-27 ENCOUNTER — Encounter (INDEPENDENT_AMBULATORY_CARE_PROVIDER_SITE_OTHER): Payer: Self-pay | Admitting: Physician Assistant

## 2019-02-27 ENCOUNTER — Other Ambulatory Visit: Payer: Self-pay

## 2019-02-27 ENCOUNTER — Ambulatory Visit (INDEPENDENT_AMBULATORY_CARE_PROVIDER_SITE_OTHER): Payer: 59 | Admitting: Physician Assistant

## 2019-02-27 VITALS — BP 159/79 | HR 102 | Temp 98.6°F | Ht 66.0 in | Wt 199.0 lb

## 2019-02-27 DIAGNOSIS — Z6832 Body mass index (BMI) 32.0-32.9, adult: Secondary | ICD-10-CM

## 2019-02-27 DIAGNOSIS — E669 Obesity, unspecified: Secondary | ICD-10-CM

## 2019-02-27 DIAGNOSIS — Z794 Long term (current) use of insulin: Secondary | ICD-10-CM | POA: Diagnosis not present

## 2019-02-27 DIAGNOSIS — E119 Type 2 diabetes mellitus without complications: Secondary | ICD-10-CM | POA: Diagnosis not present

## 2019-02-28 NOTE — Progress Notes (Signed)
Office: [phone]  /  Fax: [phone]   HPI:   Chief Complaint: OBESITY [name] is here to discuss his progress with his obesity treatment plan. He is on the Category 3 plan and is following his eating plan approximately 60 % of the time. He states he is walking 20 to 30 minutes 5 times per week. [name] reports that he is under a lot of stress at work, and he does not eat all of the food on the plan, but instead opts for sweets. His weight is 199 lb (90.3 kg) today and has had a weight gain of 6 pounds over a period of 2 weeks since his last visit. He has lost 0 lbs since starting treatment with [name].  Diabetes II [name] has a diagnosis of diabetes type II. He is on Jardiance, Insulin and Victoza. [name] states fasting BGs have been from 120 to 200. He admits to one episode of hypoglycemia this morning at 64. He is asymptomatic. Last A1c was at 9.1 He has been working on intensive lifestyle modifications including diet, exercise, and weight loss to help control his blood glucose levels.  ASSESSMENT AND PLAN:  Type 2 diabetes mellitus without complication, with long-term current use of insulin (HCC)  Class 1 obesity with serious comorbidity and body mass index (BMI) of 32.0 to 32.9 in adult, unspecified obesity type  PLAN:  Diabetes II [name] has been given extensive diabetes education by myself today including ideal fasting and post-prandial blood glucose readings, individual ideal Hgb A1c goals and hypoglycemia prevention. We discussed the importance of good blood sugar control to decrease the likelihood of diabetic complications such as nephropathy, neuropathy, limb loss, blindness, coronary artery disease, and death. We discussed the importance of intensive lifestyle modification including diet, exercise and weight loss as the first line treatment for diabetes. [name] will continue with medications and weight loss and will follow up at the agreed upon time.  I spent > than 50% of the 25  minute visit on counseling as documented in the note.  Obesity [name] is currently in the action stage of change. As such, his goal is to continue with weight loss efforts He has agreed to change to the lower carbohydrate, vegetable and lean protein rich diet plan [name] has been instructed to work up to a goal of 150 minutes of combined cardio and strengthening exercise per week for weight loss and overall health benefits. We discussed the following Behavioral Modification Strategies today: keeping healthy foods in the home and work on meal planning and easy cooking plans  [name] has agreed to follow up with our clinic in 2 weeks. He was informed of the importance of frequent follow up visits to maximize his success with intensive lifestyle modifications for his multiple health conditions.  I spent > than 50% of the 25 minute visit on counseling as documented in the note.   ALLERGIES: Allergies  Allergen Reactions   Pepcid [Famotidine] Other (See Comments)    Contraindicated with ODEFSEY   Prilosec [Omeprazole] Other (See Comments)    Contraindicated with RPV (lowers level of this ARV in ODEFSEY    MEDICATIONS: Current Outpatient Medications on File Prior to Visit  Medication Sig Dispense Refill   aspirin 81 MG tablet Take 1 tablet (81 mg total) by mouth every evening. 30 tablet    chlorthalidone (HYGROTON) 25 MG tablet Take 1 tablet (25 mg total) by mouth daily. 90 tablet 3   empagliflozin (JARDIANCE) 25 MG TABS tablet Take 25 mg by  mouth daily. 30 tablet 0   ezetimibe (ZETIA) 10 MG tablet Take 1 tablet (10 mg total) by mouth daily. 90 tablet 3   insulin glargine (LANTUS SOLOSTAR) 100 UNIT/ML injection Inject 45 Units into the skin at bedtime.      insulin lispro (HUMALOG) 100 UNIT/ML injection Inject 10-16 Units into the skin 3 (three) times daily before meals.     Insulin Pen Needle (BD PEN NEEDLE NANO 2ND GEN) 32G X 4 MM MISC 1 Package by Does not apply route 2 (two) times  daily. 100 each 0   Insulin Pen Needle (BD PEN NEEDLE NANO U/F) 32G X 4 MM MISC 1 Device by Does not apply route daily. 100 each 0   liraglutide (VICTOZA) 18 MG/3ML SOPN Inject 0.2 mLs (1.2 mg total) into the skin every morning. 2 pen 0   lisinopril (PRINIVIL,ZESTRIL) 10 MG tablet Take 1 tablet (10 mg total) by mouth daily. 90 tablet 3   ODEFSEY 200-25-25 MG TABS tablet TAKE 1 TABLET BY MOUTH DAILY WITH BREAKFAST. PLEASE CALL TO SCHEDULE YOUR FOLLOW UP [phone] 30 tablet 3   rosuvastatin (CRESTOR) 40 MG tablet Take 1 tablet (40 mg total) by mouth daily. 90 tablet 3   Vitamin D, Ergocalciferol, (DRISDOL) 1.25 MG (50000 UT) CAPS capsule Take 1 capsule (50,000 Units total) by mouth every 7 (seven) days. 4 capsule 0   No current facility-administered medications on file prior to visit.     PAST MEDICAL HISTORY: Past Medical History:  Diagnosis Date   CAD (coronary artery disease)    DES.  /  ... Later (10/2007) intervention for in-stent restenosis ;  EF 60%... catheterization... 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 October, 2010   Syphilitic hepatitis     PAST SURGICAL HISTORY: Past Surgical History:  Procedure Laterality Date   CHOLECYSTECTOMY OPEN  07/2001   COLONOSCOPY N/A  05/28/2017   Procedure: COLONOSCOPY;  Surgeon: Omar Houston, MD;  [facility];  Service: Endoscopy;  Laterality: N/A;  200 - pt to prep in Endo   CORONARY ANGIOPLASTY WITH STENT PLACEMENT  03/2006; 10/2007;    HERNIA REPAIR     LAPAROSCOPIC INCISIONAL / UMBILICAL / [place]  07/2001   UHR   POLYPECTOMY  05/28/2017   Procedure: POLYPECTOMY;  Surgeon: Omar Houston, MD;  Location: [facility];  Service: Endoscopy;;  colon    SOCIAL HISTORY: Social History   Tobacco Use   Smoking status: Never Smoker   Smokeless tobacco: Never Used  Substance Use Topics   Alcohol use: Yes    Alcohol/week: 0.0 - 1.0 standard drinks    Comment: 03/23/2017 "might have a drink 4-6 times/year"   Drug use: No    FAMILY HISTORY:  Family History  Problem Relation Age of Onset   Heart attack Father        7 heart attacks   CAD Father    Alcoholism Father    Hyperlipidemia Mother    Hypertension Mother    Anemia Other    Diabetes Other    CAD Sister    Diabetes Sister     ROS: Review of Systems  Constitutional: Negative for weight loss.  Endo/Heme/Allergies:       Positive for hypoglycemia    PHYSICAL EXAM: Blood pressure (!) 159/79, pulse (!) 102, temperature 98.6 F (37 C), temperature source Oral, height 5\' 6"  (1.676 m), weight 199 lb (90.3 kg), SpO2 95 %. Body mass index is 32.12 kg/m. Physical Exam Vitals signs reviewed.  Constitutional:      Appearance: Normal appearance. He is well-developed. He is obese.  Cardiovascular:     Rate and Rhythm: Normal rate.  Pulmonary:     Effort: Pulmonary effort is normal.  Musculoskeletal: Normal range of motion.  Skin:    General: Skin is warm and dry.  Neurological:     Mental Status: He is alert and oriented to person, place, and time.  Psychiatric:        Mood and Affect: Mood normal.        Behavior: Behavior normal.     RECENT LABS AND TESTS: BMET    Component Value Date/Time   NA  140 02/13/2019 1045   K 3.9 02/13/2019 1045   CL 101 02/13/2019 1045   CO2 21 02/13/2019 1045   GLUCOSE 101 (H) 02/13/2019 1045   GLUCOSE 149 (H) 02/22/2018 1025   BUN 19 02/13/2019 1045   CREATININE 0.82 02/13/2019 1045   CREATININE 0.82 02/22/2018 1025   CALCIUM 10.0 02/13/2019 1045   GFRNONAA 101 02/13/2019 1045   GFRNONAA 102 02/22/2018 1025   GFRAA 117 02/13/2019 1045   GFRAA 118 02/22/2018 1025   Lab Results  Component Value Date   HGBA1C 9.1 (H) 02/13/2019   HGBA1C 10.2 (H) 07/18/2018   HGBA1C 10.4 (H) 03/14/2018   HGBA1C 9.8 (A) 12/13/2017   HGBA1C 10.6 (H) 03/23/2017   No results found for: INSULIN CBC    Component Value Date/Time   WBC 6.6 02/13/2019 1045   WBC 7.3 02/22/2018 1025   RBC 5.60 02/13/2019 1045   RBC 5.39 02/22/2018 1025   HGB 15.7 02/13/2019 1045   HCT 47.1 02/13/2019 1045   PLT 298 02/13/2019 1045   MCV 84 02/13/2019 1045   MCH 28.0 02/13/2019 1045   MCH 28.6 02/22/2018 1025   MCHC 33.3 02/13/2019 1045   MCHC 34.5 02/22/2018 1025   RDW 13.6 02/13/2019 1045   LYMPHSABS 2.0 02/13/2019 1045   MONOABS 0.3 03/23/2017 1524   EOSABS 0.1 02/13/2019 1045   BASOSABS 0.1 02/13/2019 1045   Iron/TIBC/Ferritin/ %Sat No results found for: IRON, TIBC, FERRITIN, IRONPCTSAT Lipid Panel     Component Value Date/Time   CHOL 187 02/13/2019 1045   TRIG 423 (H) 02/13/2019 1045   HDL 35 (L) 02/13/2019 1045   CHOLHDL 4.2 02/22/2018 1025   VLDL 59 (H) 10/28/2015 0957   LDLCALC 83 02/13/2019 1045   LDLCALC 66 02/22/2018 1025   Hepatic Function Panel     Component Value Date/Time   PROT 6.8 02/13/2019 1045   ALBUMIN 4.3 02/13/2019 1045   AST 15 02/13/2019 1045   ALT 18 02/13/2019 1045   ALKPHOS 67 02/13/2019 1045   BILITOT <0.2 02/13/2019 1045  Ref. Range 02/13/2019 10:45  Vitamin D, 25-Hydroxy Latest Ref Range: 30.0 - 100.0 ng/mL 26.4 (L)    OBESITY BEHAVIORAL INTERVENTION VISIT  Today's visit was # 21   Starting weight: 199 lbs Starting  date: 03/14/2018 Today's weight : 199 lbs Today's date: 02/27/2019 Total lbs lost to date: 0    02/27/2019  Height 5\' 6"  (1.676 m)  Weight 199 lb (90.3 kg)  BMI (Calculated) 32.13  BLOOD PRESSURE - SYSTOLIC [id number]  BLOOD PRESSURE - DIASTOLIC 79   Body Fat % 123XX123 %  Total Body Water (lbs) 99.8 lbs    ASK: We discussed the diagnosis of obesity with [name] today and Dayquan agreed to give [name] permission to discuss obesity behavioral modification therapy today.  ASSESS: Ysrael has the diagnosis of obesity and his BMI today is 32.13 [place] is in the action stage of change   ADVISE: Aliyan was educated on the multiple health risks of obesity as well as the benefit of weight loss to improve his health. He was advised of the need for long term treatment and the importance of lifestyle modifications to improve his current health and to decrease his risk of future health problems.  AGREE: Multiple dietary modification options and treatment options were discussed and  Cannan agreed to follow the recommendations documented in the above note.  ARRANGE: Orvis was educated on the importance of frequent visits to treat obesity as outlined per CMS and USPSTF guidelines and agreed to schedule his next follow up appointment today.  Corey Skains, am acting as transcriptionist for Abby Potash, PA-C I, Abby Potash, PA-C have reviewed above note and agree with its content

## 2019-03-13 ENCOUNTER — Telehealth: Payer: Self-pay

## 2019-03-13 ENCOUNTER — Other Ambulatory Visit: Payer: Self-pay

## 2019-03-13 ENCOUNTER — Ambulatory Visit (INDEPENDENT_AMBULATORY_CARE_PROVIDER_SITE_OTHER): Payer: 59 | Admitting: Physician Assistant

## 2019-03-13 VITALS — BP 143/83 | HR 103 | Temp 98.5°F | Ht 66.0 in | Wt 192.0 lb

## 2019-03-13 DIAGNOSIS — Z6831 Body mass index (BMI) 31.0-31.9, adult: Secondary | ICD-10-CM | POA: Diagnosis not present

## 2019-03-13 DIAGNOSIS — E669 Obesity, unspecified: Secondary | ICD-10-CM

## 2019-03-13 DIAGNOSIS — Z794 Long term (current) use of insulin: Secondary | ICD-10-CM | POA: Diagnosis not present

## 2019-03-13 DIAGNOSIS — Z9189 Other specified personal risk factors, not elsewhere classified: Secondary | ICD-10-CM | POA: Diagnosis not present

## 2019-03-13 DIAGNOSIS — E559 Vitamin D deficiency, unspecified: Secondary | ICD-10-CM | POA: Diagnosis not present

## 2019-03-13 DIAGNOSIS — E119 Type 2 diabetes mellitus without complications: Secondary | ICD-10-CM

## 2019-03-13 MED ORDER — VITAMIN D (ERGOCALCIFEROL) 1.25 MG (50000 UNIT) PO CAPS: 50000.0000 [IU] | ORAL_CAPSULE | ORAL | 0 refills | Status: DC

## 2019-03-13 MED ORDER — LIRAGLUTIDE 18 MG/3ML ~~LOC~~ SOPN: 1.8000 mg | PEN_INJECTOR | Freq: Every morning | SUBCUTANEOUS | 0 refills | Status: DC

## 2019-03-13 MED FILL — ODEFSEY 200-25-25 MG TABS: 200-25-25 | 30 days supply | Qty: 30 | Fill #1

## 2019-03-13 MED FILL — VIT D2 1.25 MG (50,000 UNIT: 1.25 MG | 28 days supply | Qty: 4 | Fill #0

## 2019-03-13 MED FILL — LANTUS SOLOSTAR 100 UNITS/M: 100 | 28 days supply | Qty: 30 | Fill #1

## 2019-03-13 MED FILL — VICTOZA 18 MG/3 ML INJECT P: 18 | 30 days supply | Qty: 9 | Fill #0

## 2019-03-13 NOTE — Telephone Encounter (Signed)
COVID-19 Pre-Screening Questions:03/13/19   Do you currently have a fever (>100 F), chills or unexplained body aches? NO  Are you currently experiencing new cough, shortness of breath, sore throat, runny nose? *NO .  Have you recently travelled outside the state of  in the last 14 days?NO .  Have you been in contact with someone that is currently pending confirmation of Covid19 testing or has been confirmed to have the Covid19 virus?  NO  **If the patient answers NO to ALL questions -  advise the patient to please call the clinic before coming to the office should any symptoms develop.     

## 2019-03-14 ENCOUNTER — Ambulatory Visit: Payer: 59 | Admitting: Infectious Disease

## 2019-03-14 ENCOUNTER — Encounter: Payer: Self-pay | Admitting: Infectious Disease

## 2019-03-14 VITALS — BP 158/89 | HR 92 | Temp 98.2°F | Wt 197.0 lb

## 2019-03-14 DIAGNOSIS — B20 Human immunodeficiency virus [HIV] disease: Secondary | ICD-10-CM | POA: Diagnosis not present

## 2019-03-14 DIAGNOSIS — Z23 Encounter for immunization: Secondary | ICD-10-CM

## 2019-03-14 DIAGNOSIS — I2510 Atherosclerotic heart disease of native coronary artery without angina pectoris: Secondary | ICD-10-CM | POA: Diagnosis not present

## 2019-03-14 DIAGNOSIS — E119 Type 2 diabetes mellitus without complications: Secondary | ICD-10-CM | POA: Diagnosis not present

## 2019-03-14 DIAGNOSIS — Z794 Long term (current) use of insulin: Secondary | ICD-10-CM

## 2019-03-14 DIAGNOSIS — I2583 Coronary atherosclerosis due to lipid rich plaque: Secondary | ICD-10-CM

## 2019-03-14 MED ORDER — ODEFSEY 200-25-25 MG PO TABS: 1.0000 | ORAL_TABLET | Freq: Every day | ORAL | 11 refills | Status: DC

## 2019-03-14 NOTE — Addendum Note (Signed)
Addended by: Carlean Purl on: 03/14/2019 11:22 AM   Modules accepted: Orders

## 2019-03-14 NOTE — Progress Notes (Signed)
Patient ID: [name], male    DOB: 06-09-1965, 53 y.o.   MRN: [id number]  Chief complaint  53 year old Caucasian man with HIV perfectly controlled with ODefsey .He does have comorbid CAD, DM,  HTN.  Shot has been highly adherent to his medications.  He is coming for routine yearly visit.  He did state that he missed a couple doses of his Odefsey due to what sounds like an aura like norovirus-like infection with nausea and vomiting that he and a coworker both had for several days.  This was roughly 10 days ago and is recovered.  Otherwise he has no complaints.    Lab Results  Component Value Date   HIV1RNAQUANT <20 DETECTED (A) 02/22/2018   HIV1RNAQUANT 34 (H) 11/30/2016   HIV1RNAQUANT <20 10/28/2015    Lab Results  Component Value Date   CD4TABS 1,050 02/22/2018   CD4TABS 1,350 11/30/2016   CD4TABS 980 10/28/2015   Past Medical History:  Diagnosis Date  . CAD (coronary artery disease)    DES.  /  ... Later (10/2007) intervention for in-stent restenosis ;  EF 60%... catheterization... June, 2009  . Chest pain   . Diabetes mellitus type 2, uncontrolled ([place]) 09/27/2014  . Diabetes mellitus type II   . DKA (diabetic ketoacidoses) ([place]) 12/2010; 03/23/2017  . Dyslipidemia   . Ejection fraction    EF 60%, catheterization, 2009  . Gallbladder problem   . History of heart attack   . History of kidney stones   . HIV (human immunodeficiency virus infection) ([place]) dx'd ~ 2002/2003  . HLD (hyperlipidemia)   . HTN (hypertension)   . 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  . Nausea and vomiting 03/23/2017  . 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 RPR  reverted to negative  . Sinus tachycardia    mild at rest.... October, 2010  . Syphilitic hepatitis     Past Surgical History:  Procedure Laterality Date  . CHOLECYSTECTOMY OPEN  07/2001  . COLONOSCOPY N/A 05/28/2017    Procedure: COLONOSCOPY;  Surgeon: Rogene Houston, MD;  [facility];  Service: Endoscopy;  Laterality: N/A;  200 - pt to prep in Endo  . CORONARY ANGIOPLASTY WITH STENT PLACEMENT  03/2006; 10/2007;   . HERNIA REPAIR    . LAPAROSCOPIC INCISIONAL / UMBILICAL / VENTRAL HERNIA REPAIR  07/2001   UHR  . POLYPECTOMY  05/28/2017   Procedure: POLYPECTOMY;  Surgeon: Rogene Houston, MD;  [facility];  Service: Endoscopy;;  colon    Family History  Problem Relation Age of Onset  . Heart attack Father        7 heart attacks  . CAD Father   . Alcoholism Father   . Hyperlipidemia Mother   . Hypertension Mother   . Anemia Other   . Diabetes Other   . CAD Sister   . Diabetes Sister       Social History   Socioeconomic History  . Marital status: Single    Spouse name: Not on file  . Number of children: Not on file  . Years of education: Not on file  . Highest education level: Not on file  Occupational History  . Not on file  Social Needs  . Financial resource strain: Not on file  . Food insecurity    Worry: Not on file    Inability: Not on file  . Transportation  needs    Medical: Not on file    Non-medical: Not on file  Tobacco Use  . Smoking status: Never Smoker  . Smokeless tobacco: Never Used  Substance and Sexual Activity  . Alcohol use: Yes    Alcohol/week: 0.0 - 1.0 standard drinks    Comment: Social drinker  . Drug use: No  . Sexual activity: Not Currently    Partners: Male    Comment: offered condoms 02/2019  Lifestyle  . Physical activity    Days per week: Not on file    Minutes per session: Not on file  . Stress: Not on file  Relationships  . Social Herbalist on phone: Not on file    Gets together: Not on file    Attends religious service: Not on file    Active member of club or organization: Not on file    Attends meetings of clubs or organizations: Not on file    Relationship status: Not on file  Other Topics Concern   . Not on file  Social History Narrative  . Not on file    Allergies  Allergen Reactions  . Pepcid [Famotidine] Other (See Comments)    Contraindicated with ODEFSEY  . Prilosec [Omeprazole] Other (See Comments)    Contraindicated with RPV (lowers level of this ARV in ODEFSEY     Current Outpatient Medications:  .  ODEFSEY 200-25-25 MG TABS tablet, TAKE 1 TABLET BY MOUTH DAILY WITH BREAKFAST. PLEASE CALL TO SCHEDULE YOUR FOLLOW UP [phone], Disp: 30 tablet, Rfl: 3 .  aspirin 81 MG tablet, Take 1 tablet (81 mg total) by mouth every evening., Disp: 30 tablet, Rfl:  .  chlorthalidone (HYGROTON) 25 MG tablet, Take 1 tablet (25 mg total) by mouth daily., Disp: 90 tablet, Rfl: 3 .  Continuous Blood Gluc Sensor (FREESTYLE LIBRE 14 DAY SENSOR) MISC, , Disp: , Rfl:  .  empagliflozin (JARDIANCE) 25 MG TABS tablet, Take 25 mg by mouth daily., Disp: 30 tablet, Rfl: 0 .  ezetimibe (ZETIA) 10 MG tablet, Take 1 tablet (10 mg total) by mouth daily., Disp: 90 tablet, Rfl: 3 .  HUMALOG KWIKPEN 100 UNIT/ML KwikPen, , Disp: , Rfl:  .  insulin glargine (LANTUS SOLOSTAR) 100 UNIT/ML injection, Inject 45 Units into the skin at bedtime. , Disp: , Rfl:  .  insulin lispro (HUMALOG) 100 UNIT/ML injection, Inject 10-16 Units into the skin 3 (three) times daily before meals., Disp: , Rfl:  .  Insulin Pen Needle (BD PEN NEEDLE NANO 2ND GEN) 32G X 4 MM MISC, 1 Package by Does not apply route 2 (two) times daily., Disp: 100 each, Rfl: 0 .  Insulin Pen Needle (BD PEN NEEDLE NANO U/F) 32G X 4 MM MISC, 1 Device by Does not apply route daily., Disp: 100 each, Rfl: 0 .  LANTUS SOLOSTAR 100 UNIT/ML Solostar Pen, , Disp: , Rfl:  .  liraglutide (VICTOZA) 18 MG/3ML SOPN, Inject 0.3 mLs (1.8 mg total) into the skin every morning., Disp: 3 pen, Rfl: 0 .  lisinopril (PRINIVIL,ZESTRIL) 10 MG tablet, Take 1 tablet (10 mg total) by mouth daily., Disp: 90 tablet, Rfl: 3 .  metoprolol succinate (TOPROL-XL) 100 MG 24 hr tablet, ,  Disp: , Rfl:  .  rosuvastatin (CRESTOR) 40 MG tablet, Take 1 tablet (40 mg total) by mouth daily., Disp: 90 tablet, Rfl: 3 .  Vitamin D, Ergocalciferol, (DRISDOL) 1.25 MG (50000 UT) CAPS capsule, Take 1 capsule (50,000 Units total) by mouth every 7 (  seven) days., Disp: 4 capsule, Rfl: 0    Review of Systems  Constitutional: Negative for activity change, appetite change, chills, diaphoresis, fatigue, fever and unexpected weight change.  HENT: Negative for congestion, rhinorrhea, sinus pressure, sneezing, sore throat and trouble swallowing.   Eyes: Negative for photophobia and visual disturbance.  Respiratory: Negative for cough, chest tightness, shortness of breath, wheezing and stridor.   Cardiovascular: Negative for chest pain, palpitations and leg swelling.  Gastrointestinal: Positive for nausea and vomiting. Negative for abdominal distention, abdominal pain, anal bleeding, blood in stool, constipation and diarrhea.  Endocrine: Negative for polydipsia, polyphagia and polyuria.  Genitourinary: Negative for difficulty urinating, dysuria, flank pain and hematuria.  Musculoskeletal: Negative for arthralgias, back pain, gait problem, joint swelling and myalgias.  Skin: Negative for color change, pallor, rash and wound.  Neurological: Negative for dizziness, tremors, weakness and light-headedness.  Hematological: Negative for adenopathy. Does not bruise/bleed easily.  Psychiatric/Behavioral: Negative for agitation, behavioral problems, confusion, decreased concentration, dysphoric mood and sleep disturbance.       Objective:   Physical Exam  Constitutional: He is oriented to person, place, and time. He appears well-developed. No distress.  HENT:  Head: Normocephalic and atraumatic.  Mouth/Throat: Oropharynx is clear and moist. No oropharyngeal exudate.  Eyes: Pupils are equal, round, and reactive to light. Conjunctivae and EOM are normal. No scleral icterus.  Neck: Normal range of motion.  Neck supple. No JVD present.  Cardiovascular: Normal rate, regular rhythm and normal heart sounds. Exam reveals no gallop and no friction rub.  No murmur heard. Pulmonary/Chest: Effort normal and breath sounds normal. No respiratory distress. He has no wheezes. He has no rales. He exhibits no tenderness.  Abdominal: Soft. Bowel sounds are normal. He exhibits no distension and no mass. There is no abdominal tenderness. There is no rebound and no guarding.  Musculoskeletal:        General: No tenderness or edema.  Lymphadenopathy:    He has no cervical adenopathy.  Neurological: He is alert and oriented to person, place, and time. He has normal reflexes. He exhibits normal muscle tone. Coordination normal.  Skin: Skin is warm and dry. He is not diaphoretic. No erythema. No pallor.  Psychiatric: His behavior is normal. Judgment and thought content normal.  Nursing note and vitals reviewed.         Assessment & Plan:    [id number] controlled with ODEFSEY. [name] is interested in [place] RPV and CAB when available   DM:  Hyperlipidemia: "" on statin   CAD: on beta blocker, acei, statin, asa, I was going to add troponion but I cannot run it fast out of my lab and would defer to inpatient team  He needs a tetanus shot and Pneumovax which was given today.

## 2019-03-15 LAB — T-HELPER CELL (CD4) - (RCID CLINIC ONLY)
CD4 % Helper T Cell: 43 % (ref 33–65)
CD4 T Cell Abs: 880 /uL (ref 400–1790)

## 2019-03-15 NOTE — Progress Notes (Signed)
Office: [phone]  /  Fax: [phone]   HPI:   Chief Complaint: OBESITY [name] is here to discuss his progress with his obesity treatment plan. He is on the Category 3 plan and is following his eating plan approximately 60 % of the time. He states he is exercising 0 minutes 0 times per week. [name] reports that he is doing a better job with eating dinner, because his mother is cooking for him. His weight is 192 lb (87.1 kg) today and has had a weight loss of 7 pounds over a period of 2 weeks since his last visit. He has lost 7 lbs since starting treatment with [name].  Vitamin D deficiency [name] has a diagnosis of vitamin D deficiency. He is currently taking vit D and denies nausea, vomiting or muscle weakness.  Diabetes II [name] has a diagnosis of diabetes type II. He is on Jardiance, Victoza and Lantus. [name] states fasting BGs range between 80 and 150, and he had one episode of hypoglycemia of 57. His appetite is well controlled. Last A1c was at 9.1 on 02/13/19. He has been working on intensive lifestyle modifications including diet, exercise, and weight loss to help control his blood glucose levels.  At risk for cardiovascular disease [name] is at a higher than average risk for cardiovascular disease due to obesity and diabetes. He currently denies any chest pain.  ASSESSMENT AND PLAN:  Vitamin D deficiency - Plan: Vitamin D, Ergocalciferol, (DRISDOL) 1.25 MG (50000 UT) CAPS capsule  Type 2 diabetes mellitus without complication, with long-term current use of insulin (HCC) - Plan: liraglutide (VICTOZA) 18 MG/3ML SOPN  At risk for heart disease  Class 1 obesity with serious comorbidity and body mass index (BMI) of 31.0 to 31.9 in adult, unspecified obesity type  PLAN:  Vitamin D Deficiency [name] was informed that low vitamin D levels contributes to fatigue and are associated with obesity, breast, and colon cancer. [name] agrees to continue to take prescription Vit D @50 ,000 IU  every week #4 with no refills and he will follow up for routine testing of vitamin D, at least 2-3 times per year. He was informed of the risk of over-replacement of vitamin D and agrees to not increase his dose unless he discusses this with [name] first. [name] agrees to follow up with our clinic in 2 weeks.  Diabetes II [name] has been given extensive diabetes education by myself today including ideal fasting and post-prandial blood glucose readings, individual ideal Hgb A1c goals and hypoglycemia prevention. We discussed the importance of good blood sugar control to decrease the likelihood of diabetic complications such as nephropathy, neuropathy, limb loss, blindness, coronary artery disease, and death. We discussed the importance of intensive lifestyle modification including diet, exercise and weight loss as the first line treatment for diabetes. [name] agrees to increase Victoza to 1.8 mg sub Q every morning #3 pens with no refills and follow up at the agreed upon time.  Cardiovascular risk counseling [name] was given extended (15 minutes) coronary artery disease prevention counseling today. He is 53 y.o. male and has risk factors for heart disease including obesity and diabetes. We discussed intensive lifestyle modifications today with an emphasis on specific weight loss instructions and strategies. Pt was also informed of the importance of increasing exercise and decreasing saturated fats to help prevent heart disease.  Obesity [name] is currently in the action stage of change. As such, his goal is to continue with weight loss efforts He has agreed to follow the Category  3 plan [name] has been instructed to work up to a goal of 150 minutes of combined cardio and strengthening exercise per week for weight loss and overall health benefits. We discussed the following Behavioral Modification Strategies today: keeping healthy foods in the home and work on meal planning and easy cooking plans  [name] has  agreed to follow up with our clinic in 2 weeks. He was informed of the importance of frequent follow up visits to maximize his success with intensive lifestyle modifications for his multiple health conditions.  ALLERGIES: Allergies  Allergen Reactions   Pepcid [Famotidine] Other (See Comments)    Contraindicated with ODEFSEY   Prilosec [Omeprazole] Other (See Comments)    Contraindicated with RPV (lowers level of this ARV in ODEFSEY    MEDICATIONS: Current Outpatient Medications on File Prior to Visit  Medication Sig Dispense Refill   aspirin 81 MG tablet Take 1 tablet (81 mg total) by mouth every evening. 30 tablet    chlorthalidone (HYGROTON) 25 MG tablet Take 1 tablet (25 mg total) by mouth daily. 90 tablet 3   empagliflozin (JARDIANCE) 25 MG TABS tablet Take 25 mg by mouth daily. 30 tablet 0   ezetimibe (ZETIA) 10 MG tablet Take 1 tablet (10 mg total) by mouth daily. 90 tablet 3   insulin glargine (LANTUS SOLOSTAR) 100 UNIT/ML injection Inject 45 Units into the skin at bedtime.      insulin lispro (HUMALOG) 100 UNIT/ML injection Inject 10-16 Units into the skin 3 (three) times daily before meals.     Insulin Pen Needle (BD PEN NEEDLE NANO 2ND GEN) 32G X 4 MM MISC 1 Package by Does not apply route 2 (two) times daily. 100 each 0   Insulin Pen Needle (BD PEN NEEDLE NANO U/F) 32G X 4 MM MISC 1 Device by Does not apply route daily. 100 each 0   lisinopril (PRINIVIL,ZESTRIL) 10 MG tablet Take 1 tablet (10 mg total) by mouth daily. 90 tablet 3   rosuvastatin (CRESTOR) 40 MG tablet Take 1 tablet (40 mg total) by mouth daily. 90 tablet 3   No current facility-administered medications on file prior to visit.     PAST MEDICAL HISTORY: Past Medical History:  Diagnosis Date   CAD (coronary artery disease)    DES.  /  ... Later (10/2007) intervention for in-stent restenosis ;  EF 60%... catheterization... 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 October, 2010   Syphilitic hepatitis     PAST SURGICAL HISTORY: Past Surgical History:  Procedure Laterality Date   CHOLECYSTECTOMY OPEN  07/2001   COLONOSCOPY N/A 05/28/2017   Procedure: COLONOSCOPY;  Surgeon: Rogene Houston, MD;  Location: [facility];  Service: Endoscopy;  Laterality: N/A;  200 - pt to  prep in Endo   CORONARY ANGIOPLASTY WITH STENT PLACEMENT  03/2006; 10/2007;    HERNIA REPAIR     LAPAROSCOPIC INCISIONAL / UMBILICAL / VENTRAL HERNIA REPAIR  07/2001   UHR   POLYPECTOMY  05/28/2017   Procedure: POLYPECTOMY;  Surgeon: Rogene Houston, MD;  Location: [facility];  Service: Endoscopy;;  colon    SOCIAL HISTORY: Social History   Tobacco Use   Smoking status: Never Smoker   Smokeless tobacco: Never Used  Substance Use Topics   Alcohol use: Yes    Alcohol/week: 0.0 - 1.0 standard drinks    Comment: Social drinker   Drug use: No    FAMILY HISTORY: Family History  Problem Relation Age of Onset   Heart attack Father        7 heart attacks   CAD Father    Alcoholism Father    Hyperlipidemia Mother    Hypertension Mother    Anemia Other    Diabetes Other    CAD Sister    Diabetes Sister     ROS: Review of Systems  Constitutional: Positive for weight loss.  Cardiovascular:  Negative for chest pain.  Gastrointestinal: Negative for nausea and vomiting.  Musculoskeletal:       Negative for muscle weakness  Endo/Heme/Allergies:       Positive for hypoglycemia Negative for polyphagia    PHYSICAL EXAM: Blood pressure (!) 143/83, pulse (!) 103, temperature 98.5 F (36.9 C), temperature source Oral, height 5\' 6"  (1.676 m), weight 192 lb (87.1 kg), SpO2 95 %. Body mass index is 30.99 kg/m. Physical Exam Vitals signs reviewed.  Constitutional:      Appearance: Normal appearance. He is well-developed. He is obese.  Cardiovascular:     Rate and Rhythm: Normal rate.  Pulmonary:     Effort: Pulmonary effort is normal.  Musculoskeletal: Normal range of motion.  Skin:    General: Skin is warm and dry.  Neurological:     Mental Status: He is alert and oriented to person, place, and time.  Psychiatric:        Mood and Affect: Mood normal.        Behavior: Behavior normal.     RECENT LABS AND TESTS: BMET    Component Value Date/Time   NA 140 03/14/2019 1000   NA 140 02/13/2019 1045   K 4.7 03/14/2019 1000   CL 104 03/14/2019 1000   CO2 27 03/14/2019 1000   GLUCOSE 241 (H) 03/14/2019 1000   BUN 16 03/14/2019 1000   BUN 19 02/13/2019 1045   CREATININE 0.82 03/14/2019 1000   CALCIUM 9.1 03/14/2019 1000   GFRNONAA 101 03/14/2019 1000   GFRAA 117 03/14/2019 1000   Lab Results  Component Value Date   HGBA1C 9.1 (H) 02/13/2019   HGBA1C 10.2 (H) 07/18/2018   HGBA1C 10.4 (H) 03/14/2018   HGBA1C 9.8 (A) 12/13/2017   HGBA1C 10.6 (H) 03/23/2017   No results found for: INSULIN CBC    Component Value Date/Time   WBC 4.9 03/14/2019 1000   RBC 5.32 03/14/2019 1000   HGB 15.0 03/14/2019 1000   HGB 15.7 02/13/2019 1045   HCT 45.0 03/14/2019 1000   HCT 47.1 02/13/2019 1045   PLT 286 03/14/2019 1000   PLT 298 02/13/2019 1045   MCV 84.6 03/14/2019 1000   MCV 84 02/13/2019 1045   MCH 28.2 03/14/2019 1000   MCHC 33.3 03/14/2019 1000   RDW 13.3 03/14/2019  1000   RDW 13.6 02/13/2019 1045  LYMPHSABS 2,073 03/14/2019 1000   LYMPHSABS 2.0 02/13/2019 1045   MONOABS 0.3 03/23/2017 1524   EOSABS 137 03/14/2019 1000   EOSABS 0.1 02/13/2019 1045   BASOSABS 49 03/14/2019 1000   BASOSABS 0.1 02/13/2019 1045   Iron/TIBC/Ferritin/ %Sat No results found for: IRON, TIBC, FERRITIN, IRONPCTSAT Lipid Panel     Component Value Date/Time   CHOL 226 (H) 03/14/2019 1000   CHOL 187 02/13/2019 1045   TRIG 246 (H) 03/14/2019 1000   HDL 41 03/14/2019 1000   HDL 35 (L) 02/13/2019 1045   CHOLHDL 5.5 (H) 03/14/2019 1000   VLDL 59 (H) 10/28/2015 0957   LDLCALC 145 (H) 03/14/2019 1000   Hepatic Function Panel     Component Value Date/Time   PROT 6.2 03/14/2019 1000   PROT 6.8 02/13/2019 1045   ALBUMIN 4.3 02/13/2019 1045   AST 14 03/14/2019 1000   ALT 17 03/14/2019 1000   ALKPHOS 67 02/13/2019 1045   BILITOT 0.4 03/14/2019 1000   BILITOT <0.2 02/13/2019 1045      Ref. Range 02/13/2019 10:45  Vitamin D, 25-Hydroxy Latest Ref Range: 30.0 - 100.0 ng/mL 26.4 (L)    OBESITY BEHAVIORAL INTERVENTION VISIT  Today's visit was # 22  Starting weight: 199 lbs Starting date: 03/14/2018 Today's weight : 192 lbs Today's date: 03/13/2019 Total lbs lost to date: 7    03/13/2019  Height 5\' 6"  (1.676 m)  Weight 192 lb (87.1 kg)  BMI (Calculated) 31  BLOOD PRESSURE - SYSTOLIC [id number]  BLOOD PRESSURE - DIASTOLIC 83   Body Fat % 123456 %  Total Body Water (lbs) 95.8 lbs    ASK: We discussed the diagnosis of obesity with [name] today and [place] agreed to give [name] permission to discuss obesity behavioral modification therapy today.  ASSESS: [name] has the diagnosis of obesity and his BMI today is 65 [name] is in the action stage of change   ADVISE: [name] was educated on the multiple health risks of obesity as well as the benefit of weight loss to improve his health. He was advised of the need for long term treatment and the importance of lifestyle  modifications to improve his current health and to decrease his risk of future health problems.  AGREE: Multiple dietary modification options and treatment options were discussed and  [name] agreed to follow the recommendations documented in the above note.  ARRANGE: [name] was educated on the importance of frequent visits to treat obesity as outlined per CMS and USPSTF guidelines and agreed to schedule his next follow up appointment today.  Corey Skains, am acting as transcriptionist for Abby Potash, PA-C I, Abby Potash, PA-C have reviewed above note and agree with its content

## 2019-03-17 LAB — COMPLETE METABOLIC PANEL WITH GFR
AG Ratio: 1.6 (calc) (ref 1.0–2.5)
ALT: 17 U/L (ref 9–46)
AST: 14 U/L (ref 10–35)
Albumin: 3.8 g/dL (ref 3.6–5.1)
Alkaline phosphatase (APISO): 59 U/L (ref 35–144)
BUN: 16 mg/dL (ref 7–25)
CO2: 27 mmol/L (ref 20–32)
Calcium: 9.1 mg/dL (ref 8.6–10.3)
Chloride: 104 mmol/L (ref 98–110)
Creat: 0.82 mg/dL (ref 0.70–1.33)
GFR, Est African American: 117 mL/min/{1.73_m2} (ref >=60)
GFR, Est Non African American: 101 mL/min/{1.73_m2} (ref >=60)
Globulin: 2.4 g/dL (calc) (ref 1.9–3.7)
Glucose, Bld: 241 mg/dL — ABNORMAL HIGH (ref 65–99)
Potassium: 4.7 mmol/L (ref 3.5–5.3)
Sodium: 140 mmol/L (ref 135–146)
Total Bilirubin: 0.4 mg/dL (ref 0.2–1.2)
Total Protein: 6.2 g/dL (ref 6.1–8.1)

## 2019-03-17 LAB — CBC WITH DIFFERENTIAL/PLATELET
Absolute Monocytes: 466 cells/uL (ref 200–950)
Basophils Absolute: 49 cells/uL (ref 0–200)
Basophils Relative: 1 %
Eosinophils Absolute: 137 cells/uL (ref 15–500)
Eosinophils Relative: 2.8 %
HCT: 45 % (ref 38.5–50.0)
Hemoglobin: 15 g/dL (ref 13.2–17.1)
Lymphs Abs: 2073 cells/uL (ref 850–3900)
MCH: 28.2 pg (ref 27.0–33.0)
MCHC: 33.3 g/dL (ref 32.0–36.0)
MCV: 84.6 fL (ref 80.0–100.0)
MPV: 9.2 fL (ref 7.5–12.5)
Monocytes Relative: 9.5 %
Neutro Abs: 2176 cells/uL (ref 1500–7800)
Neutrophils Relative %: 44.4 %
Platelets: 286 10*3/uL (ref 140–400)
RBC: 5.32 10*6/uL (ref 4.20–5.80)
RDW: 13.3 % (ref 11.0–15.0)
Total Lymphocyte: 42.3 %
WBC: 4.9 10*3/uL (ref 3.8–10.8)

## 2019-03-17 LAB — LIPID PANEL
Cholesterol: 226 mg/dL — ABNORMAL HIGH (ref <200)
HDL: 41 mg/dL (ref >=40)
LDL Cholesterol (Calc): 145 mg/dL (calc) — ABNORMAL HIGH
Non-HDL Cholesterol (Calc): 185 mg/dL (calc) — ABNORMAL HIGH (ref <130)
Total CHOL/HDL Ratio: 5.5 (calc) — ABNORMAL HIGH (ref <5.0)
Triglycerides: 246 mg/dL — ABNORMAL HIGH (ref <150)

## 2019-03-17 LAB — HIV-1 RNA QUANT-NO REFLEX-BLD
HIV 1 RNA Quant: <20 copies/mL
HIV-1 RNA Quant, Log: <1.3 Log copies/mL

## 2019-03-17 LAB — RPR: RPR Ser Ql: NONREACTIVE

## 2019-03-29 ENCOUNTER — Encounter (INDEPENDENT_AMBULATORY_CARE_PROVIDER_SITE_OTHER): Payer: Self-pay

## 2019-04-03 ENCOUNTER — Encounter (INDEPENDENT_AMBULATORY_CARE_PROVIDER_SITE_OTHER): Payer: Self-pay | Admitting: Physician Assistant

## 2019-04-03 ENCOUNTER — Other Ambulatory Visit: Payer: Self-pay

## 2019-04-03 ENCOUNTER — Ambulatory Visit (INDEPENDENT_AMBULATORY_CARE_PROVIDER_SITE_OTHER): Payer: 59 | Admitting: Physician Assistant

## 2019-04-03 VITALS — BP 116/73 | HR 89 | Temp 98.1°F | Ht 66.0 in | Wt 189.0 lb

## 2019-04-03 DIAGNOSIS — E559 Vitamin D deficiency, unspecified: Secondary | ICD-10-CM

## 2019-04-03 DIAGNOSIS — Z9189 Other specified personal risk factors, not elsewhere classified: Secondary | ICD-10-CM

## 2019-04-03 DIAGNOSIS — Z6830 Body mass index (BMI) 30.0-30.9, adult: Secondary | ICD-10-CM | POA: Diagnosis not present

## 2019-04-03 DIAGNOSIS — E669 Obesity, unspecified: Secondary | ICD-10-CM

## 2019-04-03 DIAGNOSIS — E119 Type 2 diabetes mellitus without complications: Secondary | ICD-10-CM

## 2019-04-03 DIAGNOSIS — Z794 Long term (current) use of insulin: Secondary | ICD-10-CM | POA: Diagnosis not present

## 2019-04-03 MED ORDER — VITAMIN D (ERGOCALCIFEROL) 1.25 MG (50000 UNIT) PO CAPS: 50000.0000 [IU] | ORAL_CAPSULE | ORAL | 0 refills | Status: DC

## 2019-04-03 MED ORDER — METFORMIN HCL ER 500 MG PO TB24: 500.0000 mg | ORAL_TABLET | Freq: Two times a day (BID) | ORAL | 0 refills | Status: DC

## 2019-04-03 MED FILL — metFORMIN HCL ER 500 MG TB2: 500 | 30 days supply | Qty: 60 | Fill #0

## 2019-04-03 NOTE — Progress Notes (Signed)
Office: [phone]  /  Fax: [phone]   HPI:   Chief Complaint: OBESITY [name] is here to discuss his progress with his obesity treatment plan. He is on the  follow the Category 3 plan and is following his eating plan approximately 70 % of the time. He states he is exercising 0 minutes 0 times per week. [name] states that he has had a lot of family stress recently and has been back and forth to [place] causing him to eat out more often.  His weight is 189 lb (85.7 kg) today and has had a weight loss of 3 pounds over a period of 3 weeks since his last visit. He has lost 10 lbs since starting treatment with [name].  Diabetes II [name] has a diagnosis of diabetes type II. Shimon states fasting BGs range between 130 and 170 and denies any hypoglycemic episodes. Last A1c was 9.1. He has been working on intensive lifestyle modifications including diet, exercise, and weight loss to help control his blood glucose levels. She is currently on Victoza, insulin, and Jardiance.   Vitamin D deficiency [name] has a diagnosis of vitamin D deficiency. He is currently taking vit D and denies nausea, vomiting or muscle weakness.  At risk for osteopenia and osteoporosis [name] is at higher risk of osteopenia and osteoporosis due to vitamin D deficiency.   ASSESSMENT AND PLAN:  Type 2 diabetes mellitus without complication, with long-term current use of insulin (HCC) - Plan: metFORMIN (GLUCOPHAGE-XR) 500 MG 24 hr tablet  Vitamin D deficiency - Plan: Vitamin D, Ergocalciferol, (DRISDOL) 1.25 MG (50000 UT) CAPS capsule  At risk for osteoporosis  Class 1 obesity with serious comorbidity and body mass index (BMI) of 30.0 to 30.9 in adult, unspecified obesity type  PLAN: Diabetes II [name] has been given extensive diabetes education by myself today including ideal fasting and post-prandial blood glucose readings, individual ideal HgA1c goals  and hypoglycemia prevention. We discussed the importance of good blood  sugar control to decrease the likelihood of diabetic complications such as nephropathy, neuropathy, limb loss, blindness, coronary artery disease, and death. We discussed the importance of intensive lifestyle modification including diet, exercise and weight loss as the first line treatment for diabetes. [name] agrees to continue his diabetes medications and add Metformin 500 mg ER BID #60 with no refills and will follow up at the agreed upon time.  Vitamin D Deficiency [name] was informed that low vitamin D levels contributes to fatigue and are associated with obesity, breast, and colon cancer. He agrees to continue to take prescription Vit D @50 ,000 IU every week #4 with no refills and will follow up for routine testing of vitamin D, at least 2-3 times per year. He was informed of the risk of over-replacement of vitamin D and agrees to not increase his dose unless he discusses this with [name] first. Agrees to follow up with our clinic as directed.   At risk for osteopenia and osteoporosis [name] was given extended  (15 minutes) osteoporosis prevention counseling today. [name] is at risk for osteopenia and osteoporosis due to his vitamin D deficiency. He was encouraged to take his vitamin D and follow his higher calcium diet and increase strengthening exercise to help strengthen his bones and decrease his risk of osteopenia and osteoporosis.  Obesity [name] is currently in the action stage of change. As such, his goal is to continue with weight loss efforts He has agreed to follow the Category 3 plan [name] has been instructed to work  up to a goal of 150 minutes of combined cardio and strengthening exercise per week for weight loss and overall health benefits. We discussed the following Behavioral Modification Strategies today: no skipping meals and work on meal planning and easy cooking plans   [name] has agreed to follow up with our clinic in 2 weeks. He was informed of the importance of frequent follow up  visits to maximize his success with intensive lifestyle modifications for his multiple health conditions.  ALLERGIES: Allergies  Allergen Reactions  . Pepcid [Famotidine] Other (See Comments)    Contraindicated with ODEFSEY  . Prilosec [Omeprazole] Other (See Comments)    Contraindicated with RPV (lowers level of this ARV in ODEFSEY    MEDICATIONS: Current Outpatient Medications on File Prior to Visit  Medication Sig Dispense Refill  . aspirin 81 MG tablet Take 1 tablet (81 mg total) by mouth every evening. 30 tablet   . chlorthalidone (HYGROTON) 25 MG tablet Take 1 tablet (25 mg total) by mouth daily. 90 tablet 3  . Continuous Blood Gluc Sensor (FREESTYLE LIBRE 14 DAY SENSOR) MISC     . empagliflozin (JARDIANCE) 25 MG TABS tablet Take 25 mg by mouth daily. 30 tablet 0  . emtricitabine-rilpivir-tenofovir AF (ODEFSEY) 200-25-25 MG TABS tablet Take 1 tablet by mouth daily with breakfast. 30 tablet 11  . ezetimibe (ZETIA) 10 MG tablet Take 1 tablet (10 mg total) by mouth daily. 90 tablet 3  . HUMALOG KWIKPEN 100 UNIT/ML KwikPen     . insulin glargine (LANTUS SOLOSTAR) 100 UNIT/ML injection Inject 45 Units into the skin at bedtime.     . insulin lispro (HUMALOG) 100 UNIT/ML injection Inject 10-16 Units into the skin 3 (three) times daily before meals.    . Insulin Pen Needle (BD PEN NEEDLE NANO 2ND GEN) 32G X 4 MM MISC 1 Package by Does not apply route 2 (two) times daily. 100 each 0  . Insulin Pen Needle (BD PEN NEEDLE NANO U/F) 32G X 4 MM MISC 1 Device by Does not apply route daily. 100 each 0  . LANTUS SOLOSTAR 100 UNIT/ML Solostar Pen     . liraglutide (VICTOZA) 18 MG/3ML SOPN Inject 0.3 mLs (1.8 mg total) into the skin every morning. 3 pen 0  . lisinopril (PRINIVIL,ZESTRIL) 10 MG tablet Take 1 tablet (10 mg total) by mouth daily. 90 tablet 3  . metoprolol succinate (TOPROL-XL) 100 MG 24 hr tablet     . rosuvastatin (CRESTOR) 40 MG tablet Take 1 tablet (40 mg total) by mouth daily. 90  tablet 3   No current facility-administered medications on file prior to visit.     PAST MEDICAL HISTORY: Past Medical History:  Diagnosis Date  . CAD (coronary artery disease)    DES.  /  ... Later (10/2007) intervention for in-stent restenosis ;  EF 60%... catheterization... June, 2009  . Chest pain   . Diabetes mellitus type 2, uncontrolled ([place]) 09/27/2014  . Diabetes mellitus type II   . DKA (diabetic ketoacidoses) ([place]) 12/2010; 03/23/2017  . Dyslipidemia   . Ejection fraction    EF 60%, catheterization, 2009  . Gallbladder problem   . History of heart attack   . History of kidney stones   . HIV (human immunodeficiency virus infection) ([place]) dx'd ~ 2002/2003  . HLD (hyperlipidemia)   . HTN (hypertension)   . 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  . Nausea and vomiting 03/23/2017  . 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 RPR  reverted to negative  . Sinus tachycardia    mild at rest.... October, 2010  . Syphilitic hepatitis     PAST SURGICAL HISTORY: Past Surgical History:  Procedure Laterality Date  . CHOLECYSTECTOMY OPEN  07/2001  . COLONOSCOPY N/A 05/28/2017   Procedure: COLONOSCOPY;  Surgeon: Rogene Houston, MD;  Location: [facility];  Service: Endoscopy;  Laterality: N/A;  200 - pt to prep in Endo  . CORONARY ANGIOPLASTY WITH STENT PLACEMENT  03/2006; 10/2007;   . HERNIA REPAIR    . LAPAROSCOPIC INCISIONAL / UMBILICAL / VENTRAL HERNIA REPAIR  07/2001   UHR  . POLYPECTOMY  05/28/2017   Procedure: POLYPECTOMY;  Surgeon: Rogene Houston, MD;  Location: [facility];  Service: Endoscopy;;  colon    SOCIAL HISTORY: Social History   Tobacco Use  . Smoking status: Never Smoker  . Smokeless tobacco: Never Used  Substance Use Topics  . Alcohol use: Yes    Alcohol/week: 0.0 - 1.0 standard drinks    Comment: Social drinker  . Drug use: No    FAMILY  HISTORY: Family History  Problem Relation Age of Onset  . Heart attack Father        7 heart attacks  . CAD Father   . Alcoholism Father   . Hyperlipidemia Mother   . Hypertension Mother   . Anemia Other   . Diabetes Other   . CAD Sister   . Diabetes Sister     ROS: Review of Systems  Constitutional: Positive for weight loss.  Gastrointestinal: Negative for nausea and vomiting.  Musculoskeletal:       Negative for muscle weakness  Endo/Heme/Allergies:       Negative for hypoglycemia     PHYSICAL EXAM: Blood pressure 116/73, pulse 89, temperature 98.1 F (36.7 C), temperature source Oral, height 5\' 6"  (1.676 m), weight 189 lb (85.7 kg), SpO2 96 %. Body mass index is 30.51 kg/m. Physical Exam Vitals signs reviewed.  Constitutional:      Appearance: Normal appearance. He is obese.  HENT:     Head: Normocephalic.     Nose: Nose normal.  Neck:     Musculoskeletal: Normal range of motion.  Cardiovascular:     Rate and Rhythm: Normal rate.  Pulmonary:     Effort: Pulmonary effort is normal.  Musculoskeletal: Normal range of motion.  Skin:    General: Skin is warm and dry.  Neurological:     Mental Status: He is alert and oriented to person, place, and time.  Psychiatric:        Mood and Affect: Mood normal.        Behavior: Behavior normal.     RECENT LABS AND TESTS: BMET    Component Value Date/Time   NA 140 03/14/2019 1000   NA 140 02/13/2019 1045   K 4.7 03/14/2019 1000   CL 104 03/14/2019 1000   CO2 27 03/14/2019 1000   GLUCOSE 241 (H) 03/14/2019 1000   BUN 16 03/14/2019 1000   BUN 19 02/13/2019 1045   CREATININE 0.82 03/14/2019 1000   CALCIUM 9.1 03/14/2019 1000   GFRNONAA 101 03/14/2019 1000   GFRAA 117 03/14/2019 1000   Lab Results  Component Value Date   HGBA1C 9.1 (H) 02/13/2019   HGBA1C 10.2 (H) 07/18/2018   HGBA1C 10.4 (H) 03/14/2018   HGBA1C 9.8 (A) 12/13/2017   HGBA1C 10.6 (H) 03/23/2017   No results found for: INSULIN  CBC     Component Value Date/Time   WBC 4.9 03/14/2019 1000   RBC 5.32 03/14/2019 1000   HGB 15.0 03/14/2019 1000   HGB 15.7 02/13/2019 1045   HCT 45.0 03/14/2019 1000   HCT 47.1 02/13/2019 1045   PLT 286 03/14/2019 1000   PLT 298 02/13/2019 1045   MCV 84.6 03/14/2019 1000   MCV 84 02/13/2019 1045   MCH 28.2 03/14/2019 1000   MCHC 33.3 03/14/2019 1000   RDW 13.3 03/14/2019 1000   RDW 13.6 02/13/2019 1045   LYMPHSABS 2,073 03/14/2019 1000   LYMPHSABS 2.0 02/13/2019 1045   MONOABS 0.3 03/23/2017 1524   EOSABS 137 03/14/2019 1000   EOSABS 0.1 02/13/2019 1045   BASOSABS 49 03/14/2019 1000   BASOSABS 0.1 02/13/2019 1045   Iron/TIBC/Ferritin/ %Sat No results found for: IRON, TIBC, FERRITIN, IRONPCTSAT Lipid Panel     Component Value Date/Time   CHOL 226 (H) 03/14/2019 1000   CHOL 187 02/13/2019 1045   TRIG 246 (H) 03/14/2019 1000   HDL 41 03/14/2019 1000   HDL 35 (L) 02/13/2019 1045   CHOLHDL 5.5 (H) 03/14/2019 1000   VLDL 59 (H) 10/28/2015 0957   LDLCALC 145 (H) 03/14/2019 1000   Hepatic Function Panel     Component Value Date/Time   PROT 6.2 03/14/2019 1000   PROT 6.8 02/13/2019 1045   ALBUMIN 4.3 02/13/2019 1045   AST 14 03/14/2019 1000   ALT 17 03/14/2019 1000   ALKPHOS 67 02/13/2019 1045   BILITOT 0.4 03/14/2019 1000   BILITOT <0.2 02/13/2019 1045      Component Value Date/Time   TSH 1.240 02/13/2019 1045   TSH 1.160 03/14/2018 0950   TSH 1.581 Test methodology is 3rd generation  11/08/2007 1115     Ref. Range 02/13/2019 10:45  Vitamin D, 25-Hydroxy Latest Ref Range: 30.0 - 100.0 ng/mL 26.4 (L)     OBESITY BEHAVIORAL INTERVENTION VISIT  Today's visit was # 23  Starting weight: 199 lbs Starting date: 03/14/18 Today's weight : Weight: 189 lb (85.7 kg)  Today's date: 04/03/2019 Total lbs lost to date: 10 lbs At least 15 minutes were spent on discussing the following behavioral intervention visit.   ASK: We discussed the diagnosis of obesity with [name] today and Abayomi agreed to give [name] permission to discuss obesity behavioral modification therapy today.  ASSESS: Kaevon has the diagnosis of obesity and his BMI today is 30.52 Moishe is in the action stage of change   ADVISE: Idhant was educated on the multiple health risks of obesity as well as the benefit of weight loss to improve his health. He was advised of the need for long term treatment and the importance of lifestyle modifications to improve his current health and to decrease his risk of future health problems.  AGREE: Multiple dietary modification options and treatment options were discussed and  Jakye agreed to follow the recommendations documented in the above note.  ARRANGE: Harshal was educated on the importance of frequent visits to treat obesity as outlined per CMS and USPSTF guidelines and agreed to schedule his next follow up appointment today.  Leary Roca, am acting as transcriptionist for Abby Potash, PA-C I, Abby Potash, PA-C have reviewed above note and agree with its content

## 2019-04-10 MED FILL — FREESTYLE LIBRE 14 DAY SENS: 14 days supply | Qty: 1 | Fill #3

## 2019-04-10 MED FILL — ODEFSEY 200-25-25 MG TABS: 200-25-25 | 30 days supply | Qty: 30 | Fill #2

## 2019-04-10 MED FILL — CHLORTHALIDONE 25 MG TABS: 25 | 90 days supply | Qty: 90 | Fill #2

## 2019-04-10 MED FILL — LISINOPRIL 10 MG TABS: 10 | 90 days supply | Qty: 90 | Fill #2

## 2019-04-10 MED FILL — METOPROLOL SUCCINATE ER 100: 100 | 90 days supply | Qty: 90 | Fill #2

## 2019-04-24 ENCOUNTER — Ambulatory Visit (INDEPENDENT_AMBULATORY_CARE_PROVIDER_SITE_OTHER): Payer: 59 | Admitting: Physician Assistant

## 2019-04-24 ENCOUNTER — Encounter (INDEPENDENT_AMBULATORY_CARE_PROVIDER_SITE_OTHER): Payer: Self-pay | Admitting: Physician Assistant

## 2019-04-24 ENCOUNTER — Other Ambulatory Visit: Payer: Self-pay

## 2019-04-24 VITALS — BP 144/82 | HR 92 | Temp 98.0°F | Ht 66.0 in | Wt 193.0 lb

## 2019-04-24 DIAGNOSIS — E7849 Other hyperlipidemia: Secondary | ICD-10-CM | POA: Diagnosis not present

## 2019-04-24 DIAGNOSIS — E559 Vitamin D deficiency, unspecified: Secondary | ICD-10-CM

## 2019-04-24 DIAGNOSIS — Z6831 Body mass index (BMI) 31.0-31.9, adult: Secondary | ICD-10-CM

## 2019-04-24 DIAGNOSIS — Z9189 Other specified personal risk factors, not elsewhere classified: Secondary | ICD-10-CM

## 2019-04-24 DIAGNOSIS — E669 Obesity, unspecified: Secondary | ICD-10-CM

## 2019-04-24 MED ORDER — VITAMIN D (ERGOCALCIFEROL) 1.25 MG (50000 UNIT) PO CAPS: 50000.0000 [IU] | ORAL_CAPSULE | ORAL | 0 refills | Status: DC

## 2019-04-24 MED FILL — FREESTYLE LIBRE 14 DAY SENS: 28 days supply | Qty: 2 | Fill #4

## 2019-04-24 MED FILL — VIT D2 1.25 MG (50,000 UNIT: 1.25 MG | 28 days supply | Qty: 4 | Fill #0

## 2019-04-24 NOTE — Progress Notes (Signed)
Office: [phone]  /  Fax: [phone]   HPI:   Chief Complaint: OBESITY [name] is here to discuss his progress with his obesity treatment plan. He is on the Category 3 plan and is following his eating plan approximately 40% of the time. He states he is exercising 0 minutes 0 times per week. [name] reports that he is having issues with his 2 bottom front teeth and has not been able to chew. He is seeing his dentist tomorrow. He has to eat soft foods for the next few weeks.  His weight is 193 lb (87.5 kg) today and has had a weight gain of 4 lbs since his last visit. He has lost 6 lbs since starting treatment with [name].  Vitamin D deficiency [name] has a diagnosis of Vitamin D deficiency. He is currently taking prescription Vit D and denies nausea, vomiting or muscle weakness.  At risk for osteopenia and osteoporosis [name] is at higher risk of osteopenia and osteoporosis due to Vitamin D deficiency.   Hyperlipidemia [name] has hyperlipidemia and has been trying to improve his cholesterol levels with intensive lifestyle modification including a low saturated fat diet, exercise and weight loss. He is on Crestor and Zetia and denies any chest pain, claudication or myalgias.  ASSESSMENT AND PLAN:  Vitamin D deficiency - Plan: Vitamin D, Ergocalciferol, (DRISDOL) 1.25 MG (50000 UT) CAPS capsule  Other hyperlipidemia  At risk for osteoporosis  Class 1 obesity with serious comorbidity and body mass index (BMI) of 31.0 to 31.9 in adult, unspecified obesity type  PLAN:  Vitamin D Deficiency [name] was informed that low Vitamin D levels contributes to fatigue and are associated with obesity, breast, and colon cancer. He agrees to continue to take prescription Vit D @ 50,000 IU every week #4 with 0 refills and will follow-up for routine testing of Vitamin D, at least 2-3 times per year. He was informed of the risk of over-replacement of Vitamin D and agrees to not increase his dose unless he  discusses this with [name] first. [name] agrees to follow-up with our clinic in 3 weeks.  At risk for osteopenia and osteoporosis [name] was given extended  (15 minutes) osteoporosis prevention counseling today. [name] is at risk for osteopenia and osteoporosis due to his Vitamin D deficiency. He was encouraged to take his Vitamin D and follow his higher calcium diet and increase strengthening exercise to help strengthen his bones and decrease his risk of osteopenia and osteoporosis.  Hyperlipidemia [name] was informed of the American Heart Association Guidelines emphasizing intensive lifestyle modifications as the first line treatment for hyperlipidemia. We discussed many lifestyle modifications today in depth, and [name] will continue to work on decreasing saturated fats such as fatty red meat, butter and many fried foods. [name] will continue his medications. He will also increase vegetables and lean protein in his diet and continue to work on exercise and weight loss efforts.  Obesity [name] is currently in the action stage of change. As such, his goal is to continue with weight loss efforts. He has agreed to follow the Category 3 plan. He was instructed to eat soft food containing protein. [name] has been instructed to work up to a goal of 150 minutes of combined cardio and strengthening exercise per week for weight loss and overall health benefits. We discussed the following Behavioral Modification Strategies today: work on meal planning and easy cooking plans, and keeping healthy foods in the home.  [name] has agreed to follow-up with our clinic in  3 weeks. He was informed of the importance of frequent follow-up visits to maximize his success with intensive lifestyle modifications for his multiple health conditions.  ALLERGIES: Allergies  Allergen Reactions   Pepcid [Famotidine] Other (See Comments)    Contraindicated with ODEFSEY   Prilosec [Omeprazole] Other (See Comments)    Contraindicated  with RPV (lowers level of this ARV in ODEFSEY    MEDICATIONS: Current Outpatient Medications on File Prior to Visit  Medication Sig Dispense Refill   aspirin 81 MG tablet Take 1 tablet (81 mg total) by mouth every evening. 30 tablet    chlorthalidone (HYGROTON) 25 MG tablet Take 1 tablet (25 mg total) by mouth daily. 90 tablet 3   Continuous Blood Gluc Sensor (FREESTYLE LIBRE 14 DAY SENSOR) MISC      empagliflozin (JARDIANCE) 25 MG TABS tablet Take 25 mg by mouth daily. 30 tablet 0   emtricitabine-rilpivir-tenofovir AF (ODEFSEY) 200-25-25 MG TABS tablet Take 1 tablet by mouth daily with breakfast. 30 tablet 11   ezetimibe (ZETIA) 10 MG tablet Take 1 tablet (10 mg total) by mouth daily. 90 tablet 3   HUMALOG KWIKPEN 100 UNIT/ML KwikPen      insulin glargine (LANTUS SOLOSTAR) 100 UNIT/ML injection Inject 45 Units into the skin at bedtime.      insulin lispro (HUMALOG) 100 UNIT/ML injection Inject 10-16 Units into the skin 3 (three) times daily before meals.     Insulin Pen Needle (BD PEN NEEDLE NANO 2ND GEN) 32G X 4 MM MISC 1 Package by Does not apply route 2 (two) times daily. 100 each 0   Insulin Pen Needle (BD PEN NEEDLE NANO U/F) 32G X 4 MM MISC 1 Device by Does not apply route daily. 100 each 0   LANTUS SOLOSTAR 100 UNIT/ML Solostar Pen      liraglutide (VICTOZA) 18 MG/3ML SOPN Inject 0.3 mLs (1.8 mg total) into the skin every morning. 3 pen 0   lisinopril (PRINIVIL,ZESTRIL) 10 MG tablet Take 1 tablet (10 mg total) by mouth daily. 90 tablet 3   metFORMIN (GLUCOPHAGE-XR) 500 MG 24 hr tablet Take 1 tablet (500 mg total) by mouth 2 (two) times daily. 60 tablet 0   metoprolol succinate (TOPROL-XL) 100 MG 24 hr tablet      rosuvastatin (CRESTOR) 40 MG tablet Take 1 tablet (40 mg total) by mouth daily. 90 tablet 3   No current facility-administered medications on file prior to visit.     PAST MEDICAL HISTORY: Past Medical History:  Diagnosis Date   CAD (coronary artery  disease)    DES.  /  ... Later (10/2007) intervention for in-stent restenosis ;  EF 60%... catheterization... 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 October, 2010   Syphilitic hepatitis  PAST SURGICAL HISTORY: Past Surgical History:  Procedure Laterality Date   CHOLECYSTECTOMY OPEN  07/2001   COLONOSCOPY N/A 05/28/2017   Procedure: COLONOSCOPY;  Surgeon: Rogene Houston, MD;  Location: [facility];  Service: Endoscopy;  Laterality: N/A;  200 - pt to prep in Endo   CORONARY ANGIOPLASTY WITH STENT PLACEMENT  03/2006; 10/2007;    HERNIA REPAIR     LAPAROSCOPIC INCISIONAL / UMBILICAL / [place]  07/2001   UHR   POLYPECTOMY  05/28/2017   Procedure: POLYPECTOMY;  Surgeon: Rogene Houston, MD;  Location: [facility];  Service: Endoscopy;;  colon    SOCIAL HISTORY: Social History   Tobacco Use   Smoking status: Never Smoker   Smokeless tobacco: Never Used  Substance Use Topics   Alcohol use: Yes    Alcohol/week: 0.0 - 1.0 standard drinks    Comment: Social drinker   Drug use: No    FAMILY HISTORY: Family History  Problem Relation Age of Onset   Heart attack Father        7 heart attacks   CAD Father    Alcoholism Father    Hyperlipidemia Mother     Hypertension Mother    Anemia Other    Diabetes Other    CAD Sister    Diabetes Sister    ROS: Review of Systems  Cardiovascular: Negative for chest pain and claudication.  Gastrointestinal: Negative for nausea and vomiting.  Musculoskeletal: Negative for myalgias.       Negative for muscle weakness.   PHYSICAL EXAM: Blood pressure (!) 144/82, pulse 92, temperature 98 F (36.7 C), temperature source Oral, height 5\' 6"  (1.676 m), weight 193 lb (87.5 kg), SpO2 97 %. Body mass index is 31.15 kg/m. Physical Exam Vitals signs reviewed.  Constitutional:      Appearance: Normal appearance. He is obese.  Cardiovascular:     Rate and Rhythm: Normal rate.     Pulses: Normal pulses.  Pulmonary:     Effort: Pulmonary effort is normal.     Breath sounds: Normal breath sounds.  Musculoskeletal: Normal range of motion.  Skin:    General: Skin is warm and dry.  Neurological:     Mental Status: He is alert and oriented to person, place, and time.  Psychiatric:        Behavior: Behavior normal.   RECENT LABS AND TESTS: BMET    Component Value Date/Time   NA 140 03/14/2019 1000   NA 140 02/13/2019 1045   K 4.7 03/14/2019 1000   CL 104 03/14/2019 1000   CO2 27 03/14/2019 1000   GLUCOSE 241 (H) 03/14/2019 1000   BUN 16 03/14/2019 1000   BUN 19 02/13/2019 1045   CREATININE 0.82 03/14/2019 1000   CALCIUM 9.1 03/14/2019 1000   GFRNONAA 101 03/14/2019 1000   GFRAA 117 03/14/2019 1000   Lab Results  Component Value Date   HGBA1C 9.1 (H) 02/13/2019   HGBA1C 10.2 (H) 07/18/2018   HGBA1C 10.4 (H) 03/14/2018   HGBA1C 9.8 (A) 12/13/2017   HGBA1C 10.6 (H) 03/23/2017   No results found for: INSULIN CBC    Component Value Date/Time   WBC 4.9 03/14/2019 1000   RBC 5.32 03/14/2019 1000   HGB 15.0 03/14/2019 1000   HGB 15.7 02/13/2019 1045   HCT 45.0 03/14/2019 1000   HCT 47.1 02/13/2019 1045   PLT 286 03/14/2019 1000   PLT 298 02/13/2019 1045   MCV 84.6 03/14/2019 1000   MCV  84 02/13/2019 1045   MCH 28.2 03/14/2019 1000   MCHC 33.3 03/14/2019 1000   RDW 13.3 03/14/2019 1000   RDW 13.6 02/13/2019 1045   LYMPHSABS 2,073 03/14/2019 1000   LYMPHSABS 2.0 02/13/2019 1045   MONOABS 0.3 03/23/2017 1524   EOSABS 137 03/14/2019 1000   EOSABS 0.1 02/13/2019 1045   BASOSABS 49 03/14/2019 1000   BASOSABS 0.1 02/13/2019 1045   Iron/TIBC/Ferritin/ %Sat No results found for: IRON, TIBC, FERRITIN, IRONPCTSAT Lipid Panel     Component Value Date/Time   CHOL 226 (H) 03/14/2019 1000   CHOL 187 02/13/2019 1045   TRIG 246 (H) 03/14/2019 1000   HDL 41 03/14/2019 1000   HDL 35 (L) 02/13/2019 1045   CHOLHDL 5.5 (H) 03/14/2019 1000   VLDL 59 (H) 10/28/2015 0957   LDLCALC 145 (H) 03/14/2019 1000   Hepatic Function Panel     Component Value Date/Time   PROT 6.2 03/14/2019 1000   PROT 6.8 02/13/2019 1045   ALBUMIN 4.3 02/13/2019 1045   AST 14 03/14/2019 1000   ALT 17 03/14/2019 1000   ALKPHOS 67 02/13/2019 1045   BILITOT 0.4 03/14/2019 1000   BILITOT <0.2 02/13/2019 1045    Results for VICTORIOUS, KIMERY (MRN [id number]) as of 04/24/2019 14:17  Ref. Range 02/13/2019 10:45  Vitamin D, 25-Hydroxy Latest Ref Range: 30.0 - 100.0 ng/mL 26.4 (L)   OBESITY BEHAVIORAL INTERVENTION VISIT  Today's visit was #24  Starting weight: 199 lbs Starting date: 03/14/2018 Today's weight: 193 lbs  Today's date: 04/24/2019 Total lbs lost to date: 6     04/24/2019  Height 5\' 6"  (1.676 m)  Weight 193 lb (87.5 kg)  BMI (Calculated) 31.17  BLOOD PRESSURE - SYSTOLIC [id number]  BLOOD PRESSURE - DIASTOLIC 82   Body Fat % 0000000 %  Total Body Water (lbs) 96.6 lbs   ASK: We discussed the diagnosis of obesity with [name] today and [name] agreed to give [name] permission to discuss obesity behavioral modification therapy today.  ASSESS: [name] has the diagnosis of obesity and his BMI today is 31.2. [name] is in the action stage of change.   ADVISE: [name] was educated on the multiple health  risks of obesity as well as the benefit of weight loss to improve his health. He was advised of the need for long term treatment and the importance of lifestyle modifications to improve his current health and to decrease his risk of future health problems.  AGREE: Multiple dietary modification options and treatment options were discussed and  [name] agreed to follow the recommendations documented in the above note.  ARRANGE: [name] was educated on the importance of frequent visits to treat obesity as outlined per CMS and USPSTF guidelines and agreed to schedule his next follow up appointment today.  Migdalia Dk, am acting as transcriptionist for Abby Potash, PA-C I, Abby Potash, PA-C have reviewed above note and agree with its content

## 2019-05-03 ENCOUNTER — Other Ambulatory Visit (INDEPENDENT_AMBULATORY_CARE_PROVIDER_SITE_OTHER): Payer: Self-pay | Admitting: Physician Assistant

## 2019-05-03 DIAGNOSIS — E119 Type 2 diabetes mellitus without complications: Secondary | ICD-10-CM

## 2019-05-03 DIAGNOSIS — Z794 Long term (current) use of insulin: Secondary | ICD-10-CM

## 2019-05-04 MED FILL — LANTUS SOLOSTAR 100 UNITS/M: 100 | 28 days supply | Qty: 30 | Fill #2

## 2019-05-04 MED FILL — HUMALOG 100 UNITS/ML KWIKPE: 100 | 90 days supply | Qty: 90 | Fill #2

## 2019-05-08 MED FILL — ODEFSEY 200-25-25 MG TABS: 200-25-25 | 30 days supply | Qty: 30 | Fill #3

## 2019-05-15 ENCOUNTER — Ambulatory Visit (INDEPENDENT_AMBULATORY_CARE_PROVIDER_SITE_OTHER): Payer: 59 | Admitting: Physician Assistant

## 2019-05-22 MED FILL — FREESTYLE LIBRE 14 DAY SENS: 84 days supply | Qty: 6 | Fill #5

## 2019-05-31 ENCOUNTER — Encounter (INDEPENDENT_AMBULATORY_CARE_PROVIDER_SITE_OTHER): Payer: Self-pay | Admitting: Physician Assistant

## 2019-05-31 ENCOUNTER — Other Ambulatory Visit: Payer: Self-pay | Admitting: Infectious Disease

## 2019-05-31 DIAGNOSIS — B20 Human immunodeficiency virus [HIV] disease: Secondary | ICD-10-CM

## 2019-06-05 ENCOUNTER — Encounter (INDEPENDENT_AMBULATORY_CARE_PROVIDER_SITE_OTHER): Payer: Self-pay | Admitting: Physician Assistant

## 2019-06-05 ENCOUNTER — Other Ambulatory Visit: Payer: Self-pay

## 2019-06-05 ENCOUNTER — Ambulatory Visit (INDEPENDENT_AMBULATORY_CARE_PROVIDER_SITE_OTHER): Payer: 59 | Admitting: Physician Assistant

## 2019-06-05 VITALS — BP 128/77 | HR 80 | Temp 98.1°F | Ht 66.0 in | Wt 200.0 lb

## 2019-06-05 DIAGNOSIS — E119 Type 2 diabetes mellitus without complications: Secondary | ICD-10-CM | POA: Diagnosis not present

## 2019-06-05 DIAGNOSIS — E559 Vitamin D deficiency, unspecified: Secondary | ICD-10-CM

## 2019-06-05 DIAGNOSIS — Z794 Long term (current) use of insulin: Secondary | ICD-10-CM

## 2019-06-05 DIAGNOSIS — Z9189 Other specified personal risk factors, not elsewhere classified: Secondary | ICD-10-CM

## 2019-06-05 DIAGNOSIS — Z6832 Body mass index (BMI) 32.0-32.9, adult: Secondary | ICD-10-CM

## 2019-06-05 DIAGNOSIS — E669 Obesity, unspecified: Secondary | ICD-10-CM | POA: Diagnosis not present

## 2019-06-05 MED ORDER — VITAMIN D (ERGOCALCIFEROL) 1.25 MG (50000 UNIT) PO CAPS: 50000.0000 [IU] | ORAL_CAPSULE | ORAL | 0 refills | Status: DC

## 2019-06-05 MED ORDER — LIRAGLUTIDE 18 MG/3ML ~~LOC~~ SOPN: 1.8000 mg | PEN_INJECTOR | Freq: Every morning | SUBCUTANEOUS | 0 refills | Status: DC

## 2019-06-05 MED FILL — ODEFSEY 200-25-25 MG TABS: 200-25-25 | 30 days supply | Qty: 30 | Fill #0

## 2019-06-05 MED FILL — VIT D2 1.25 MG (50,000 UNIT: 1.25 MG | 28 days supply | Qty: 4 | Fill #0

## 2019-06-06 MED FILL — VICTOZA 18 MG/3 ML INJECT P: 18 | 30 days supply | Qty: 9 | Fill #0

## 2019-06-07 NOTE — Progress Notes (Signed)
Chief Complaint:   OBESITY [name] is here to discuss his progress with his obesity treatment plan along with follow-up of his obesity related diagnoses. [name] is on the Category 3 Plan and states he is following his eating plan approximately 30% of the time. [name] states he is exercising 0 minutes 0 times per week.  Today's visit was #: 25 Starting weight: 199 lbs Starting date: 03/14/2018 Today's weight: 200 lbs Today's date: 06/05/2019 Total lbs lost to date: 0 Total lbs lost since last in-office visit: 0  Interim History: [name] reports that he has been working a lot of overtime at work, due to the [employer] crisis, and he has been either skipping meals or not eating on the plan.  Subjective:   Type 2 diabetes mellitus without complication, with long-term current use of insulin (HCC) (chronic) [place] has worsening diabetes. His blood sugars are worsening, due to stress and lack of following the plan. He had one episode of hypoglycemia at 67. He is asymptomatic. [name] is on Victoza. Metformin, Lantus Solostar, Humalog and Jardiance. His fasting BGs range between 67 and 250. During the day his blood sugars run between 130 and 140.  Vitamin D deficiency  [name] has no nausea, vomiting or muscle weakness on vitamin D weekly. His last vitamin D level was at 26.4 (01/24/19). He is due for labs.  At risk for heart disease [name] is at a higher than average risk for cardiovascular disease due to obesity and diabetes. Reviewed: no chest pain on exertion, no dyspnea on exertion, and no swelling of ankles.  Assessment/Plan:   Type 2 diabetes mellitus without complication, with long-term current use of insulin (HCC) (chronic)  [name] has been given diabetes education by myself today. Good blood sugar control is important to decrease the likelihood of diabetic complications such as nephropathy, neuropathy, limb loss, blindness, coronary artery disease, and death. Intensive lifestyle  modification including diet, exercise and weight loss were discussed as the first line treatment for diabetes. [name] agrees to continue Victoza 1.8 mg #3 pens with no refills. [name] is due for labs at the next visit.  Vitamin D deficiency Low Vitamin D level contributes to fatigue and are associated with obesity, breast, and colon cancer. [name] agrees to continue to take prescription Vitamin D @50 ,000 IU every week #4 with no refills and he will follow-up for routine testing of vitamin D, at least 2-3 times per year to avoid over-replacement. We will check labs at the next visit.  At risk for heart disease [name] was given approximately 15 minutes of coronary artery disease prevention counseling today. He is 54 y.o. male and has risk factors for heart disease including obesity and diabetes. We discussed intensive lifestyle modifications today with an emphasis on specific weight loss instructions and strategies.   Obesity [name] is currently in the action stage of change. As such, his goal is to continue with weight loss efforts. He has agreed to on the Category 3 Plan.   We discussed the following exercise goals today: For substantial health benefits, adults should do at least 150 minutes (2 hours and 30 minutes) a week of moderate-intensity, or 75 minutes (1 hour and 15 minutes) a week of vigorous-intensity aerobic physical activity, or an equivalent combination of moderate- and vigorous-intensity aerobic activity. Aerobic activity should be performed in episodes of at least 10 minutes, and preferably, it should be spread throughout the week. Adults should also include muscle-strengthening activities that involve all major muscle groups  on 2 or more days a week.  We discussed the following behavioral modification strategies today: no skipping meals and meal planning and cooking strategies.  [name] has agreed to follow-up with our clinic in 3 weeks. He was informed of the importance of frequent  follow-up visits to maximize his success with intensive lifestyle modifications for his multiple health conditions.   [name] was informed we would discuss his lab results at his next visit unless there is a critical issue that needs to be addressed sooner. [name] agreed to keep his next visit at the agreed upon time to discuss these results.  Objective:   Blood pressure 128/77, pulse 80, temperature 98.1 F (36.7 C), temperature source Oral, height 5\' 6"  (1.676 m), weight 200 lb (90.7 kg), SpO2 96 %. Body mass index is 32.28 kg/m.  General: Cooperative, alert, well developed, in no acute distress. HEENT: Conjunctivae and lids unremarkable. Neck: No thyromegaly.  Cardiovascular: Regular rhythm.  Lungs: Normal work of breathing. Extremities: No edema.  Neurologic: No focal deficits.   Lab Results  Component Value Date   CREATININE 0.82 03/14/2019   BUN 16 03/14/2019   NA 140 03/14/2019   K 4.7 03/14/2019   CL 104 03/14/2019   CO2 27 03/14/2019   Lab Results  Component Value Date   ALT 17 03/14/2019   AST 14 03/14/2019   ALKPHOS 67 02/13/2019   BILITOT 0.4 03/14/2019   Lab Results  Component Value Date   HGBA1C 9.1 (H) 02/13/2019   HGBA1C 10.2 (H) 07/18/2018   HGBA1C 10.4 (H) 03/14/2018   HGBA1C 9.8 (A) 12/13/2017   HGBA1C 10.6 (H) 03/23/2017   No results found for: INSULIN Lab Results  Component Value Date   TSH 1.240 02/13/2019   Lab Results  Component Value Date   CHOL 226 (H) 03/14/2019   HDL 41 03/14/2019   LDLCALC 145 (H) 03/14/2019   TRIG 246 (H) 03/14/2019   CHOLHDL 5.5 (H) 03/14/2019   Lab Results  Component Value Date   WBC 4.9 03/14/2019   HGB 15.0 03/14/2019   HCT 45.0 03/14/2019   MCV 84.6 03/14/2019   PLT 286 03/14/2019   No results found for: IRON, TIBC, FERRITIN   Ref. Range 02/13/2019 10:45  Vitamin D, 25-Hydroxy Latest Ref Range: 30.0 - 100.0 ng/mL 26.4 (L)    Attestation Statements:   Reviewed by clinician on day of visit:  allergies, medications, problem list, medical history, surgical history, family history, social history, and previous encounter notes.  Corey Skains, am acting as Location manager for [employer], PA-C.  I have reviewed the above documentation for accuracy and completeness, and I agree with the above. Abby Potash, PA-C

## 2019-06-26 ENCOUNTER — Other Ambulatory Visit (INDEPENDENT_AMBULATORY_CARE_PROVIDER_SITE_OTHER): Payer: Self-pay | Admitting: Physician Assistant

## 2019-06-26 DIAGNOSIS — Z794 Long term (current) use of insulin: Secondary | ICD-10-CM

## 2019-06-26 DIAGNOSIS — E119 Type 2 diabetes mellitus without complications: Secondary | ICD-10-CM

## 2019-06-27 ENCOUNTER — Ambulatory Visit (INDEPENDENT_AMBULATORY_CARE_PROVIDER_SITE_OTHER): Payer: 59 | Admitting: Physician Assistant

## 2019-06-27 ENCOUNTER — Encounter (INDEPENDENT_AMBULATORY_CARE_PROVIDER_SITE_OTHER): Payer: Self-pay | Admitting: Physician Assistant

## 2019-06-27 ENCOUNTER — Other Ambulatory Visit: Payer: Self-pay

## 2019-06-27 VITALS — BP 119/72 | HR 95 | Temp 98.2°F | Ht 66.0 in | Wt 196.0 lb

## 2019-06-27 DIAGNOSIS — Z6831 Body mass index (BMI) 31.0-31.9, adult: Secondary | ICD-10-CM

## 2019-06-27 DIAGNOSIS — E7849 Other hyperlipidemia: Secondary | ICD-10-CM

## 2019-06-27 DIAGNOSIS — E119 Type 2 diabetes mellitus without complications: Secondary | ICD-10-CM

## 2019-06-27 DIAGNOSIS — Z794 Long term (current) use of insulin: Secondary | ICD-10-CM

## 2019-06-27 DIAGNOSIS — Z9189 Other specified personal risk factors, not elsewhere classified: Secondary | ICD-10-CM

## 2019-06-27 DIAGNOSIS — E559 Vitamin D deficiency, unspecified: Secondary | ICD-10-CM | POA: Diagnosis not present

## 2019-06-27 DIAGNOSIS — E669 Obesity, unspecified: Secondary | ICD-10-CM

## 2019-06-27 MED ORDER — METFORMIN HCL ER (OSM) 1000 MG PO TB24: 1000.0000 mg | ORAL_TABLET | Freq: Two times a day (BID) | ORAL | 0 refills | Status: DC

## 2019-06-27 MED ORDER — JARDIANCE 25 MG PO TABS: 25.0000 mg | ORAL_TABLET | Freq: Every day | ORAL | 0 refills | Status: DC

## 2019-06-27 MED FILL — JARDIANCE 25 MG TABLET: 25 | 90 days supply | Qty: 90 | Fill #0

## 2019-06-27 MED FILL — metFORMIN HCL ER 500 MG TB2: 500 | 30 days supply | Qty: 120 | Fill #0

## 2019-06-27 NOTE — Progress Notes (Signed)
Chief Complaint:   OBESITY [name] is here to discuss his progress with his obesity treatment plan along with follow-up of his obesity related diagnoses. [name] is on the Category 3 Plan and states he is following his eating plan approximately 30-35% of the time. [name] states he is exercising 0 minutes 0 times per week.  Today's visit was #: 88 Starting weight: 199 lbs Starting date: 03/14/2018 Today's weight: 196 lbs Today's date: 06/27/2019 Total lbs lost to date: 3 Total lbs lost since last in-office visit: 4  Interim History: [name] reports that he has been working a lot of overtime at work and, therefore, has not been eating on plan. He has tried to eat more meat and cheese and reports he is sometimes skipping meals.  Subjective:   Type 2 diabetes mellitus without complication, with long-term current use of insulin ([place]). [name] is on metformin, insulin, Victoza, and Jardiance. He states fasting blood sugars range between 75 and 220 with postprandial blood sugars ranging between 90 and 350.  Lab Results  Component Value Date   HGBA1C 9.1 (H) 02/13/2019   HGBA1C 10.2 (H) 07/18/2018   HGBA1C 10.4 (H) 03/14/2018   Lab Results  Component Value Date   MICROALBUR 1.08 12/20/2013   LDLCALC 145 (H) 03/14/2019   CREATININE 0.82 03/14/2019   No results found for: INSULIN  Vitamin D deficiency. [name] is on Vitamin D. No nausea, vomiting, or muscle weakness. Last Vitamin D level 26.4 on 02/13/2019. He is due for labs.  Other hyperlipidemia. [name] is on Zetia and Crestor. No chest pain. No myalgias. He is not exercising. Is due for labs.   Lab Results  Component Value Date   CHOL 226 (H) 03/14/2019   HDL 41 03/14/2019   LDLCALC 145 (H) 03/14/2019   TRIG 246 (H) 03/14/2019   CHOLHDL 5.5 (H) 03/14/2019   Lab Results  Component Value Date   ALT 17 03/14/2019   AST 14 03/14/2019   ALKPHOS 67 02/13/2019   BILITOT 0.4 03/14/2019   The 10-year ASCVD risk score [name] DC  Jr., et al., 2013) is: 13.6%   Values used to calculate the score:     Age: 54 years     Sex: Male     Is Non-Hispanic African American: No     Diabetic: Yes     Tobacco smoker: No     Systolic Blood Pressure: 123456 mmHg     Is BP treated: Yes     HDL Cholesterol: 41 mg/dL     Total Cholesterol: 226 mg/dL  At risk for osteoporosis. [name] is at higher risk of osteopenia and osteoporosis due to Vitamin D deficiency.   Assessment/Plan:   Type 2 diabetes mellitus without complication, with long-term current use of insulin ([place]). Good blood sugar control is important to decrease the likelihood of diabetic complications such as nephropathy, neuropathy, limb loss, blindness, coronary artery disease, and death. Intensive lifestyle modification including diet, exercise and weight loss are the first line of treatment for diabetes. Xaivier will increase metformin to 1000 mg x2 and was given a prescription for metformin (FORTAMET) 1000 MG (OSM) 24 hr tablet BID #60 with 0 refills. He was given a refill on his empagliflozin (JARDIANCE) 25 MG TABS tablet #90 with 0 refills. Comprehensive metabolic panel, Hemoglobin A1c labs ordered today.  Vitamin D deficiency. Low Vitamin D level contributes to fatigue and are associated with obesity, breast, and colon cancer. He agrees to continue to take Vitamin D  and VITAMIN D 25 Hydroxy (Vit-D Deficiency, Fractures) level was ordered today.  Other hyperlipidemia. Cardiovascular risk and specific lipid/LDL goals reviewed.  We discussed several lifestyle modifications today and [name] will continue his medications, work on diet, exercise and weight loss efforts. Orders and follow up as documented in patient record. Lipid Panel With LDL/HDL Ratio ordered.  Counseling Intensive lifestyle modifications are the first line treatment for this issue. . Dietary changes: Increase soluble fiber. Decrease simple carbohydrates. . Exercise changes: Moderate to vigorous-intensity aerobic  activity 150 minutes per week if tolerated. . Lipid-lowering medications: see documented in medical record.   At risk for osteoporosis. [name] was given approximately 15 minutes of osteoporosis prevention counseling today. [name] is at risk for osteopenia and osteoporosis due to his Vitamin D deficiency. He was encouraged to take his Vitamin D and follow his higher calcium diet and increase strengthening exercise to help strengthen his bones and decrease his risk of osteopenia and osteoporosis.  Repetitive spaced learning was employed today to elicit superior memory formation and behavioral change.  Class 1 obesity with serious comorbidity and body mass index (BMI) of 31.0 to 31.9 in adult, unspecified obesity type.  [name] is currently in the action stage of change. As such, his goal is to continue with weight loss efforts. He has agreed to the Category 3 Plan.   Exercise goals: For substantial health benefits, adults should do at least 150 minutes (2 hours and 30 minutes) a week of moderate-intensity, or 75 minutes (1 hour and 15 minutes) a week of vigorous-intensity aerobic physical activity, or an equivalent combination of moderate- and vigorous-intensity aerobic activity. Aerobic activity should be performed in episodes of at least 10 minutes, and preferably, it should be spread throughout the week.  Behavioral modification strategies: increasing lean protein intake and no skipping meals.  [name] has agreed to follow-up with our clinic in 3 weeks. He was informed of the importance of frequent follow-up visits to maximize his success with intensive lifestyle modifications for his multiple health conditions.   [name] was informed we would discuss his lab results at his next visit unless there is a critical issue that needs to be addressed sooner. [name] agreed to keep his next visit at the agreed upon time to discuss these results.  Objective:   Blood pressure 119/72, pulse 95, temperature 98.2 F  (36.8 C), temperature source Oral, height 5\' 6"  (1.676 m), weight 196 lb (88.9 kg), SpO2 96 %. Body mass index is 31.64 kg/m.  General: Cooperative, alert, well developed, in no acute distress. HEENT: Conjunctivae and lids unremarkable. Cardiovascular: Regular rhythm.  Lungs: Normal work of breathing. Neurologic: No focal deficits.   Lab Results  Component Value Date   CREATININE 0.82 03/14/2019   BUN 16 03/14/2019   NA 140 03/14/2019   K 4.7 03/14/2019   CL 104 03/14/2019   CO2 27 03/14/2019   Lab Results  Component Value Date   ALT 17 03/14/2019   AST 14 03/14/2019   ALKPHOS 67 02/13/2019   BILITOT 0.4 03/14/2019   Lab Results  Component Value Date   HGBA1C 9.1 (H) 02/13/2019   HGBA1C 10.2 (H) 07/18/2018   HGBA1C 10.4 (H) 03/14/2018   HGBA1C 9.8 (A) 12/13/2017   HGBA1C 10.6 (H) 03/23/2017   No results found for: INSULIN Lab Results  Component Value Date   TSH 1.240 02/13/2019   Lab Results  Component Value Date   CHOL 226 (H) 03/14/2019   HDL 41 03/14/2019   LDLCALC 145 (  H) 03/14/2019   TRIG 246 (H) 03/14/2019   CHOLHDL 5.5 (H) 03/14/2019   Lab Results  Component Value Date   WBC 4.9 03/14/2019   HGB 15.0 03/14/2019   HCT 45.0 03/14/2019   MCV 84.6 03/14/2019   PLT 286 03/14/2019   No results found for: IRON, TIBC, FERRITIN  Attestation Statements:   Reviewed by clinician on day of visit: allergies, medications, problem list, medical history, surgical history, family history, social history, and previous encounter notes.  IMichaelene Song, am acting as transcriptionist for Abby Potash, PA-C   I have reviewed the above documentation for accuracy and completeness, and I agree with the above. Abby Potash, PA-C

## 2019-06-28 LAB — LIPID PANEL WITH LDL/HDL RATIO
Cholesterol, Total: 162 mg/dL (ref 100–199)
HDL: 44 mg/dL (ref >39)
LDL Chol Calc (NIH): 80 mg/dL (ref 0–99)
LDL/HDL Ratio: 1.8 ratio (ref 0.0–3.6)
Triglycerides: 232 mg/dL — ABNORMAL HIGH (ref 0–149)
VLDL Cholesterol Cal: 38 mg/dL (ref 5–40)

## 2019-06-28 LAB — COMPREHENSIVE METABOLIC PANEL
ALT: 21 IU/L (ref 0–44)
AST: 17 IU/L (ref 0–40)
Albumin/Globulin Ratio: 1.6 (ref 1.2–2.2)
Albumin: 4.6 g/dL (ref 3.8–4.9)
Alkaline Phosphatase: 66 IU/L (ref 39–117)
BUN/Creatinine Ratio: 20 (ref 9–20)
BUN: 16 mg/dL (ref 6–24)
Bilirubin Total: 0.3 mg/dL (ref 0.0–1.2)
CO2: 26 mmol/L (ref 20–29)
Calcium: 10.2 mg/dL (ref 8.7–10.2)
Chloride: 102 mmol/L (ref 96–106)
Creatinine, Ser: 0.79 mg/dL (ref 0.76–1.27)
GFR calc Af Amer: 118 mL/min/{1.73_m2} (ref >59)
GFR calc non Af Amer: 102 mL/min/{1.73_m2} (ref >59)
Globulin, Total: 2.8 g/dL (ref 1.5–4.5)
Glucose: 43 mg/dL — ABNORMAL LOW (ref 65–99)
Potassium: 4.1 mmol/L (ref 3.5–5.2)
Sodium: 145 mmol/L — ABNORMAL HIGH (ref 134–144)
Total Protein: 7.4 g/dL (ref 6.0–8.5)

## 2019-06-28 LAB — VITAMIN D 25 HYDROXY (VIT D DEFICIENCY, FRACTURES): Vit D, 25-Hydroxy: 29 ng/mL — ABNORMAL LOW (ref 30.0–100.0)

## 2019-06-28 LAB — HEMOGLOBIN A1C
Est. average glucose Bld gHb Est-mCnc: 229 mg/dL
Hgb A1c MFr Bld: 9.6 % — ABNORMAL HIGH (ref 4.8–5.6)

## 2019-06-29 MED FILL — LANTUS SOLOSTAR 100 UNITS/M: 100 | 28 days supply | Qty: 30 | Fill #3

## 2019-06-30 MED FILL — ROSUVASTATIN CALCIUM 40 MG: 40 | 90 days supply | Qty: 90 | Fill #2

## 2019-07-03 MED FILL — ODEFSEY 200-25-25 MG TABS: 200-25-25 | 30 days supply | Qty: 30 | Fill #1

## 2019-07-05 MED FILL — CHLORTHALIDONE 25 MG TABS: 25 | 90 days supply | Qty: 90 | Fill #3

## 2019-07-05 MED FILL — LISINOPRIL 10 MG TABS: 10 | 90 days supply | Qty: 90 | Fill #3

## 2019-07-18 ENCOUNTER — Ambulatory Visit (INDEPENDENT_AMBULATORY_CARE_PROVIDER_SITE_OTHER): Payer: 59 | Admitting: Physician Assistant

## 2019-07-19 ENCOUNTER — Ambulatory Visit: Payer: 59 | Admitting: Cardiovascular Disease

## 2019-07-24 ENCOUNTER — Other Ambulatory Visit: Payer: Self-pay

## 2019-07-24 ENCOUNTER — Encounter (INDEPENDENT_AMBULATORY_CARE_PROVIDER_SITE_OTHER): Payer: Self-pay | Admitting: Physician Assistant

## 2019-07-24 ENCOUNTER — Ambulatory Visit (INDEPENDENT_AMBULATORY_CARE_PROVIDER_SITE_OTHER): Payer: 59 | Admitting: Physician Assistant

## 2019-07-24 VITALS — BP 115/74 | HR 98 | Temp 98.1°F | Ht 66.0 in | Wt 194.0 lb

## 2019-07-24 DIAGNOSIS — E669 Obesity, unspecified: Secondary | ICD-10-CM

## 2019-07-24 DIAGNOSIS — E119 Type 2 diabetes mellitus without complications: Secondary | ICD-10-CM | POA: Diagnosis not present

## 2019-07-24 DIAGNOSIS — Z9189 Other specified personal risk factors, not elsewhere classified: Secondary | ICD-10-CM | POA: Diagnosis not present

## 2019-07-24 DIAGNOSIS — Z6831 Body mass index (BMI) 31.0-31.9, adult: Secondary | ICD-10-CM | POA: Diagnosis not present

## 2019-07-24 DIAGNOSIS — Z794 Long term (current) use of insulin: Secondary | ICD-10-CM

## 2019-07-24 DIAGNOSIS — E559 Vitamin D deficiency, unspecified: Secondary | ICD-10-CM | POA: Diagnosis not present

## 2019-07-24 MED ORDER — VITAMIN D (ERGOCALCIFEROL) 1.25 MG (50000 UNIT) PO CAPS: 50000.0000 [IU] | ORAL_CAPSULE | ORAL | 0 refills | Status: DC

## 2019-07-24 MED FILL — VIT D2 1.25 MG (50,000 UNIT: 1.25 MG | 30 days supply | Qty: 10 | Fill #0

## 2019-07-24 NOTE — Progress Notes (Signed)
Chief Complaint:   OBESITY [name] is here to discuss his progress with his obesity treatment plan along with follow-up of his obesity related diagnoses. [name] is on the Category 3 Plan and states he is following his eating plan approximately 70% of the time. [name] states he is exercising 0 minutes 0 times per week.  Today's visit was #: 6 Starting weight: 199 lbs Starting date: 03/14/2018 Today's weight: 194 lbs Today's date: 07/24/2019 Total lbs lost to date: 5 Total lbs lost since last in-office visit: 2  Interim History: [name] reports that he lost power for a few days and ate a lot of sandwiches. His work load has decreased and he is doing better with meal planning.  Subjective:   Vitamin D deficiency. [name] is on prescription Vitamin D weekly. No nausea, vomiting, or muscle weakness. Last Vitamin D level 29.0 on 06/27/2019.  Type 2 diabetes mellitus without complication, with long-term current use of insulin ([place]). [name] is on Lantus, Humalog, metformin, Victoza, and Jardiance. Fasting blood sugars range between 77 and 157 with postprandial blood sugars 135 - 200.  Lab Results  Component Value Date   HGBA1C 9.6 (H) 06/27/2019   HGBA1C 9.1 (H) 02/13/2019   HGBA1C 10.2 (H) 07/18/2018   Lab Results  Component Value Date   MICROALBUR 1.08 12/20/2013   [name] 80 06/27/2019   CREATININE 0.79 06/27/2019   No results found for: INSULIN  At risk for heart disease. [name] is at a higher than average risk for cardiovascular disease due to obesity. Reviewed: no chest pain on exertion, no dyspnea on exertion, and no swelling of ankles.  Assessment/Plan:   Vitamin D deficiency. Low Vitamin D level contributes to fatigue and are associated with obesity, breast, and colon cancer. He will increase his dose of Vitamin D to twice weekly and was given a prescription for Vitamin D, Ergocalciferol, (DRISDOL) 1.25 MG (50000 UNIT) CAPS capsule every 3 days #10 with 0 refills. He will  follow-up for routine testing of Vitamin D, at least 2-3 times per year to avoid over-replacement.    Type 2 diabetes mellitus without complication, with long-term current use of insulin ([place]). Good blood sugar control is important to decrease the likelihood of diabetic complications such as nephropathy, neuropathy, limb loss, blindness, coronary artery disease, and death. Intensive lifestyle modification including diet, exercise and weight loss are the first line of treatment for diabetes. [name] will continue his medications as directed.  At risk for heart disease. [name] was given approximately 15 minutes of coronary artery disease prevention counseling today. He is 54 y.o. male and has risk factors for heart disease including obesity. We discussed intensive lifestyle modifications today with an emphasis on specific weight loss instructions and strategies.   Repetitive spaced learning was employed today to elicit superior memory formation and behavioral change.  Class 1 obesity with serious comorbidity and body mass index (BMI) of 31.0 to 31.9 in adult, unspecified obesity type.  [name] is currently in the action stage of change. As such, his goal is to continue with weight loss efforts. He has agreed to the Category 3 Plan.   Exercise goals: For substantial health benefits, adults should do at least 150 minutes (2 hours and 30 minutes) a week of moderate-intensity, or 75 minutes (1 hour and 15 minutes) a week of vigorous-intensity aerobic physical activity, or an equivalent combination of moderate- and vigorous-intensity aerobic activity. Aerobic activity should be performed in episodes of at least 10 minutes, and  preferably, it should be spread throughout the week.  Behavioral modification strategies: decreasing simple carbohydrates and meal planning and cooking strategies.  [name] has agreed to follow-up with our clinic in 3 weeks. He was informed of the importance of frequent follow-up visits to  maximize his success with intensive lifestyle modifications for his multiple health conditions.   Objective:   Blood pressure 115/74, pulse 98, temperature 98.1 F (36.7 C), temperature source Oral, height 5\' 6"  (1.676 m), weight 194 lb (88 kg), SpO2 95 %. Body mass index is 31.31 kg/m.  General: Cooperative, alert, well developed, in no acute distress. HEENT: Conjunctivae and lids unremarkable. Cardiovascular: Regular rhythm.  Lungs: Normal work of breathing. Neurologic: No focal deficits.   Lab Results  Component Value Date   CREATININE 0.79 06/27/2019   BUN 16 06/27/2019   NA 145 (H) 06/27/2019   K 4.1 06/27/2019   CL 102 06/27/2019   CO2 26 06/27/2019   Lab Results  Component Value Date   ALT 21 06/27/2019   AST 17 06/27/2019   ALKPHOS 66 06/27/2019   BILITOT 0.3 06/27/2019   Lab Results  Component Value Date   HGBA1C 9.6 (H) 06/27/2019   HGBA1C 9.1 (H) 02/13/2019   HGBA1C 10.2 (H) 07/18/2018   HGBA1C 10.4 (H) 03/14/2018   HGBA1C 9.8 (A) 12/13/2017   No results found for: INSULIN Lab Results  Component Value Date   TSH 1.240 02/13/2019   Lab Results  Component Value Date   CHOL 162 06/27/2019   HDL 44 06/27/2019   LDLCALC 80 06/27/2019   TRIG 232 (H) 06/27/2019   CHOLHDL 5.5 (H) 03/14/2019   Lab Results  Component Value Date   WBC 4.9 03/14/2019   HGB 15.0 03/14/2019   HCT 45.0 03/14/2019   MCV 84.6 03/14/2019   PLT 286 03/14/2019   No results found for: IRON, TIBC, FERRITIN  Attestation Statements:   Reviewed by clinician on day of visit: allergies, medications, problem list, medical history, surgical history, family history, social history, and previous encounter notes.  IMichaelene Song, am acting as transcriptionist for Abby Potash, PA-C   I have reviewed the above documentation for accuracy and completeness, and I agree with the above. Abby Potash, PA-C

## 2019-08-01 MED FILL — ODEFSEY 200-25-25 MG TABS: 200-25-25 | 30 days supply | Qty: 30 | Fill #2

## 2019-08-04 DIAGNOSIS — E1029 Type 1 diabetes mellitus with other diabetic kidney complication: Secondary | ICD-10-CM | POA: Diagnosis not present

## 2019-08-04 DIAGNOSIS — I10 Essential (primary) hypertension: Secondary | ICD-10-CM | POA: Diagnosis not present

## 2019-08-04 DIAGNOSIS — I2510 Atherosclerotic heart disease of native coronary artery without angina pectoris: Secondary | ICD-10-CM | POA: Diagnosis not present

## 2019-08-04 DIAGNOSIS — E785 Hyperlipidemia, unspecified: Secondary | ICD-10-CM | POA: Diagnosis not present

## 2019-08-04 DIAGNOSIS — R000 Tachycardia, unspecified: Secondary | ICD-10-CM | POA: Diagnosis not present

## 2019-08-04 DIAGNOSIS — R110 Nausea: Secondary | ICD-10-CM | POA: Diagnosis not present

## 2019-08-04 DIAGNOSIS — B20 Human immunodeficiency virus [HIV] disease: Secondary | ICD-10-CM | POA: Diagnosis not present

## 2019-08-04 DIAGNOSIS — Z79899 Other long term (current) drug therapy: Secondary | ICD-10-CM | POA: Diagnosis not present

## 2019-08-04 DIAGNOSIS — R5383 Other fatigue: Secondary | ICD-10-CM | POA: Diagnosis not present

## 2019-08-04 DIAGNOSIS — Z125 Encounter for screening for malignant neoplasm of prostate: Secondary | ICD-10-CM | POA: Diagnosis not present

## 2019-08-10 DIAGNOSIS — E785 Hyperlipidemia, unspecified: Secondary | ICD-10-CM | POA: Diagnosis not present

## 2019-08-10 DIAGNOSIS — R110 Nausea: Secondary | ICD-10-CM | POA: Diagnosis not present

## 2019-08-10 DIAGNOSIS — E1129 Type 2 diabetes mellitus with other diabetic kidney complication: Secondary | ICD-10-CM | POA: Diagnosis not present

## 2019-08-14 ENCOUNTER — Ambulatory Visit (INDEPENDENT_AMBULATORY_CARE_PROVIDER_SITE_OTHER): Payer: 59 | Admitting: Physician Assistant

## 2019-08-14 ENCOUNTER — Encounter (INDEPENDENT_AMBULATORY_CARE_PROVIDER_SITE_OTHER): Payer: Self-pay | Admitting: Physician Assistant

## 2019-08-14 ENCOUNTER — Other Ambulatory Visit: Payer: Self-pay

## 2019-08-14 VITALS — BP 146/76 | HR 72 | Temp 98.0°F | Ht 66.0 in | Wt 194.0 lb

## 2019-08-14 DIAGNOSIS — Z9189 Other specified personal risk factors, not elsewhere classified: Secondary | ICD-10-CM | POA: Diagnosis not present

## 2019-08-14 DIAGNOSIS — Z6831 Body mass index (BMI) 31.0-31.9, adult: Secondary | ICD-10-CM | POA: Diagnosis not present

## 2019-08-14 DIAGNOSIS — E7849 Other hyperlipidemia: Secondary | ICD-10-CM

## 2019-08-14 DIAGNOSIS — E559 Vitamin D deficiency, unspecified: Secondary | ICD-10-CM | POA: Diagnosis not present

## 2019-08-14 DIAGNOSIS — E1165 Type 2 diabetes mellitus with hyperglycemia: Secondary | ICD-10-CM | POA: Diagnosis not present

## 2019-08-14 DIAGNOSIS — E669 Obesity, unspecified: Secondary | ICD-10-CM | POA: Diagnosis not present

## 2019-08-14 DIAGNOSIS — Z794 Long term (current) use of insulin: Secondary | ICD-10-CM | POA: Diagnosis not present

## 2019-08-14 MED ORDER — VITAMIN D (ERGOCALCIFEROL) 1.25 MG (50000 UNIT) PO CAPS: 50000.0000 [IU] | ORAL_CAPSULE | ORAL | 0 refills | Status: DC

## 2019-08-14 MED FILL — FREESTYLE LIBRE 14 DAY SENS: 84 days supply | Qty: 6 | Fill #6

## 2019-08-14 NOTE — Progress Notes (Signed)
Chief Complaint:   OBESITY [name] is here to discuss his progress with his obesity treatment plan along with follow-up of his obesity related diagnoses. [name] is on the Category 3 Plan and states he is following his eating plan approximately 60-70% of the time. [name] states he is walking for 30-70 minutes 2 times per week.  Today's visit was #: 28 Starting weight: 199 lbs Starting date: 03/14/2018 Today's weight: 194 lbs Today's date: 08/14/2019 Total lbs lost to date: 5 lbs Total lbs lost since last in-office visit: 0  Interim History: [name] states that he was nauseous for about a week and was unable to eat the majority of that time.  He believes that it was due to the Victoza and so he stopped it for a few days. He is feeling better and ready to get back on track.  Subjective:   1. Type 2 diabetes mellitus with hyperglycemia, with long-term current use of insulin ([facility]) Last A1c last week with PCP was 8.0.  He reports nausea recently.  His PCP recommended he stop his Victoza.  Five days after stopping his Victoza, nausea resolved.  No hypoglycemia.  Fasting blood sugars 80-307.  Lab Results  Component Value Date   HGBA1C 9.6 (H) 06/27/2019   HGBA1C 9.1 (H) 02/13/2019   HGBA1C 10.2 (H) 07/18/2018   Lab Results  Component Value Date   MICROALBUR 1.08 12/20/2013   LDLCALC 80 06/27/2019   CREATININE 0.79 06/27/2019   2. Other hyperlipidemia [name] has hyperlipidemia and has been trying to improve his cholesterol levels with intensive lifestyle modification including a low saturated fat diet, exercise and weight loss. He denies any chest pain, claudication or myalgias. Last lipid panel at patient goal.  On Crestor.  Lab Results  Component Value Date   ALT 21 06/27/2019   AST 17 06/27/2019   ALKPHOS 66 06/27/2019   BILITOT 0.3 06/27/2019   Lab Results  Component Value Date   CHOL 162 06/27/2019   HDL 44 06/27/2019   LDLCALC 80 06/27/2019   TRIG 232 (H) 06/27/2019   CHOLHDL 5.5 (H) 03/14/2019   3. Vitamin D deficiency [name]'s Vitamin D level was 29.0 on 06/27/2019. He is currently taking vit D twice weekly. He denies nausea, vomiting or muscle weakness.  4. At risk for osteoporosis [name] is at higher risk of osteopenia and osteoporosis due to Vitamin D deficiency.   Assessment/Plan:   1. Type 2 diabetes mellitus with hyperglycemia, with long-term current use of insulin ([facility]) Good blood sugar control is important to decrease the likelihood of diabetic complications such as nephropathy, neuropathy, limb loss, blindness, coronary artery disease, and death. Intensive lifestyle modification including diet, exercise and weight loss are the first line of treatment for diabetes.  He will restart Victoza at 0.6 mg daily.  2. Other hyperlipidemia Cardiovascular risk and specific lipid/LDL goals reviewed.  We discussed several lifestyle modifications today and [name] will continue to work on diet, exercise and weight loss efforts. Orders and follow up as documented in patient record.   Counseling Intensive lifestyle modifications are the first line treatment for this issue. . Dietary changes: Increase soluble fiber. Decrease simple carbohydrates. . Exercise changes: Moderate to vigorous-intensity aerobic activity 150 minutes per week if tolerated. . Lipid-lowering medications: see documented in medical record.  3. Vitamin D deficiency Low Vitamin D level contributes to fatigue and are associated with obesity, breast, and colon cancer. He agrees to continue to take prescription Vitamin D @50 ,000 IU every week  and will follow-up for routine testing of Vitamin D, at least 2-3 times per year to avoid over-replacement. - Vitamin D, Ergocalciferol, (DRISDOL) 1.25 MG (50000 UNIT) CAPS capsule; Take 1 capsule (50,000 Units total) by mouth every 3 (three) days.  Dispense: 10 capsule; Refill: 0  4. At risk for osteoporosis [name] was given approximately 15 minutes of  osteoporosis prevention counseling today. [name] is at risk for osteopenia and osteoporosis due to his Vitamin D deficiency. He was encouraged to take his Vitamin D and follow his higher calcium diet and increase strengthening exercise to help strengthen his bones and decrease his risk of osteopenia and osteoporosis.  Repetitive spaced learning was employed today to elicit superior memory formation and behavioral change.  5. Class 1 obesity with serious comorbidity and body mass index (BMI) of 31.0 to 31.9 in adult, unspecified obesity type [name] is currently in the action stage of change. As such, his goal is to continue with weight loss efforts. He has agreed to the Category 3 Plan.   Exercise goals: As is.  Behavioral modification strategies: increasing lean protein intake and no skipping meals.  [name] has agreed to follow-up with our clinic in 3 weeks. He was informed of the importance of frequent follow-up visits to maximize his success with intensive lifestyle modifications for his multiple health conditions.   Objective:   Blood pressure (!) 146/76, pulse 72, temperature 98 F (36.7 C), temperature source Oral, height 5\' 6"  (1.676 m), weight 194 lb (88 kg), SpO2 97 %. Body mass index is 31.31 kg/m.  General: Cooperative, alert, well developed, in no acute distress. HEENT: Conjunctivae and lids unremarkable. Cardiovascular: Regular rhythm.  Lungs: Normal work of breathing. Neurologic: No focal deficits.   Lab Results  Component Value Date   CREATININE 0.79 06/27/2019   BUN 16 06/27/2019   NA 145 (H) 06/27/2019   K 4.1 06/27/2019   CL 102 06/27/2019   CO2 26 06/27/2019   Lab Results  Component Value Date   ALT 21 06/27/2019   AST 17 06/27/2019   ALKPHOS 66 06/27/2019   BILITOT 0.3 06/27/2019   Lab Results  Component Value Date   HGBA1C 9.6 (H) 06/27/2019   HGBA1C 9.1 (H) 02/13/2019   HGBA1C 10.2 (H) 07/18/2018   HGBA1C 10.4 (H) 03/14/2018   HGBA1C 9.8 (A)  12/13/2017   Lab Results  Component Value Date   TSH 1.240 02/13/2019   Lab Results  Component Value Date   CHOL 162 06/27/2019   HDL 44 06/27/2019   LDLCALC 80 06/27/2019   TRIG 232 (H) 06/27/2019   CHOLHDL 5.5 (H) 03/14/2019   Lab Results  Component Value Date   WBC 4.9 03/14/2019   HGB 15.0 03/14/2019   HCT 45.0 03/14/2019   MCV 84.6 03/14/2019   PLT 286 03/14/2019   Attestation Statements:   Reviewed by clinician on day of visit: allergies, medications, problem list, medical history, surgical history, family history, social history, and previous encounter notes.  I, Water quality scientist, CMA, am acting as Location manager for [employer], [employer].  I have reviewed the above documentation for accuracy and completeness, and I agree with the above. Abby Potash, PA-C

## 2019-08-16 MED FILL — LANTUS SOLOSTAR 100 UNITS/M: 100 | 28 days supply | Qty: 30 | Fill #4

## 2019-08-16 MED FILL — HUMALOG 100 UNITS/ML KWIKPE: 100 | 90 days supply | Qty: 90 | Fill #3

## 2019-08-29 MED FILL — ODEFSEY 200-25-25 MG TABS: 200-25-25 | 30 days supply | Qty: 30 | Fill #3

## 2019-09-04 ENCOUNTER — Encounter (INDEPENDENT_AMBULATORY_CARE_PROVIDER_SITE_OTHER): Payer: Self-pay | Admitting: Physician Assistant

## 2019-09-04 ENCOUNTER — Ambulatory Visit (INDEPENDENT_AMBULATORY_CARE_PROVIDER_SITE_OTHER): Payer: 59 | Admitting: Physician Assistant

## 2019-09-04 ENCOUNTER — Other Ambulatory Visit: Payer: Self-pay

## 2019-09-04 VITALS — BP 149/82 | HR 96 | Temp 98.0°F | Ht 66.0 in | Wt 201.0 lb

## 2019-09-04 DIAGNOSIS — Z794 Long term (current) use of insulin: Secondary | ICD-10-CM

## 2019-09-04 DIAGNOSIS — E1165 Type 2 diabetes mellitus with hyperglycemia: Secondary | ICD-10-CM

## 2019-09-04 DIAGNOSIS — E559 Vitamin D deficiency, unspecified: Secondary | ICD-10-CM | POA: Diagnosis not present

## 2019-09-04 DIAGNOSIS — E669 Obesity, unspecified: Secondary | ICD-10-CM | POA: Diagnosis not present

## 2019-09-04 DIAGNOSIS — Z9189 Other specified personal risk factors, not elsewhere classified: Secondary | ICD-10-CM | POA: Diagnosis not present

## 2019-09-04 DIAGNOSIS — Z6832 Body mass index (BMI) 32.0-32.9, adult: Secondary | ICD-10-CM

## 2019-09-04 MED ORDER — VITAMIN D (ERGOCALCIFEROL) 1.25 MG (50000 UNIT) PO CAPS: 50000.0000 [IU] | ORAL_CAPSULE | ORAL | 0 refills | Status: DC

## 2019-09-04 MED FILL — VIT D2 1.25 MG (50,000 UNIT: 1.25 MG | 30 days supply | Qty: 10 | Fill #0

## 2019-09-04 NOTE — Progress Notes (Signed)
Chief Complaint:   OBESITY [name] is here to discuss his progress with his obesity treatment plan along with follow-up of his obesity related diagnoses. [name] is on the Category 3 Plan and states he is following his eating plan approximately 75% of the time. [name] states he is walking for 20 minutes 4 times per week.  Today's visit was #: 8 Starting weight: 199 lbs Starting date: 03/14/2018 Today's weight: 201 lbs Today's date: 09/04/2019 Total lbs lost to date: 0 lbs Total lbs lost since last in-office visit: 0  Interim History: [name] states that with the Easter holiday he has been eating a lot of ham.  He states that he is ready to get back on track.  Subjective:   1. Vitamin D deficiency [name]'s Vitamin D level was 29.0 on 06/27/2019. He is currently taking prescription vitamin D 50,000 IU each week. He denies nausea, vomiting or muscle weakness.  2. Type 2 diabetes mellitus with hyperglycemia, with long-term current use of insulin (HCC) Fasting blood sugars average 115.  One episode of hypoglycemia at 32.  He reports feeling no symptoms at that time.  Last A1c 8.1 with PCP.  Postprandial blood sugar up to 200.    Lab Results  Component Value Date   HGBA1C 9.6 (H) 06/27/2019   HGBA1C 9.1 (H) 02/13/2019   HGBA1C 10.2 (H) 07/18/2018   Lab Results  Component Value Date   MICROALBUR 1.08 12/20/2013   LDLCALC 80 06/27/2019   CREATININE 0.79 06/27/2019   3. At risk for hyperglycemia [name] is at risk for hyperglycemia due to his diagnosis of diabetes mellitus.  Assessment/Plan:   1. Vitamin D deficiency Low Vitamin D level contributes to fatigue and are associated with obesity, breast, and colon cancer. He agrees to continue to take prescription Vitamin D @50 ,000 IU every week and will follow-up for routine testing of Vitamin D, at least 2-3 times per year to avoid over-replacement. - Vitamin D, Ergocalciferol, (DRISDOL) 1.25 MG (50000 UNIT) CAPS capsule; Take 1 capsule  (50,000 Units total) by mouth every 3 (three) days.  Dispense: 10 capsule; Refill: 0  2. Type 2 diabetes mellitus with hyperglycemia, with long-term current use of insulin (HCC) Good blood sugar control is important to decrease the likelihood of diabetic complications such as nephropathy, neuropathy, limb loss, blindness, coronary artery disease, and death. Intensive lifestyle modification including diet, exercise and weight loss are the first line of treatment for diabetes.   3. At risk for hyperglycemia [name] was given approximately 15 minutes of counseling today regarding prevention of hyperglycemia. He was advised of hyperglycemia causes and the fact hyperglycemia is often asymptomatic. [name] was instructed to avoid skipping meals, eat regular protein rich meals and schedule low calorie but protein rich snacks as needed.   Repetitive spaced learning was employed today to elicit superior memory formation and behavioral change  4. Class 1 obesity with serious comorbidity and body mass index (BMI) of 32.0 to 32.9 in adult, unspecified obesity type [name] is currently in the action stage of change. As such, his goal is to continue with weight loss efforts. He has agreed to the Category 3 Plan.   Exercise goals: As is.  Behavioral modification strategies: meal planning and cooking strategies and keeping healthy foods in the home.  [name] has agreed to follow-up with our clinic in 3 weeks.  He will come to his next appointment fasting for IC and labs. He was informed of the importance of frequent follow-up visits to maximize  his success with intensive lifestyle modifications for his multiple health conditions.   Objective:   Blood pressure (!) 149/82, pulse 96, temperature 98 F (36.7 C), temperature source Oral, height 5\' 6"  (1.676 m), weight 201 lb (91.2 kg), SpO2 97 %. Body mass index is 32.44 kg/m.  General: Cooperative, alert, well developed, in no acute distress. HEENT: Conjunctivae and  lids unremarkable. Cardiovascular: Regular rhythm.  Lungs: Normal work of breathing. Neurologic: No focal deficits.   Lab Results  Component Value Date   CREATININE 0.79 06/27/2019   BUN 16 06/27/2019   NA 145 (H) 06/27/2019   K 4.1 06/27/2019   CL 102 06/27/2019   CO2 26 06/27/2019   Lab Results  Component Value Date   ALT 21 06/27/2019   AST 17 06/27/2019   ALKPHOS 66 06/27/2019   BILITOT 0.3 06/27/2019   Lab Results  Component Value Date   HGBA1C 9.6 (H) 06/27/2019   HGBA1C 9.1 (H) 02/13/2019   HGBA1C 10.2 (H) 07/18/2018   HGBA1C 10.4 (H) 03/14/2018   HGBA1C 9.8 (A) 12/13/2017   Lab Results  Component Value Date   TSH 1.240 02/13/2019   Lab Results  Component Value Date   CHOL 162 06/27/2019   HDL 44 06/27/2019   LDLCALC 80 06/27/2019   TRIG 232 (H) 06/27/2019   CHOLHDL 5.5 (H) 03/14/2019   Lab Results  Component Value Date   WBC 4.9 03/14/2019   HGB 15.0 03/14/2019   HCT 45.0 03/14/2019   MCV 84.6 03/14/2019   PLT 286 03/14/2019   Attestation Statements:   Reviewed by clinician on day of visit: allergies, medications, problem list, medical history, surgical history, family history, social history, and previous encounter notes.  I, [name], CMA, am acting as Location manager for [employer], PA-C.  I have reviewed the above documentation for accuracy and completeness, and I agree with the above. Omar Potash, PA-C

## 2019-09-26 ENCOUNTER — Other Ambulatory Visit: Payer: Self-pay

## 2019-09-26 ENCOUNTER — Ambulatory Visit (INDEPENDENT_AMBULATORY_CARE_PROVIDER_SITE_OTHER): Payer: 59 | Admitting: Physician Assistant

## 2019-09-26 ENCOUNTER — Encounter (INDEPENDENT_AMBULATORY_CARE_PROVIDER_SITE_OTHER): Payer: Self-pay | Admitting: Physician Assistant

## 2019-09-26 VITALS — BP 143/80 | HR 93 | Temp 98.5°F | Ht 66.0 in | Wt 197.0 lb

## 2019-09-26 DIAGNOSIS — E785 Hyperlipidemia, unspecified: Secondary | ICD-10-CM | POA: Diagnosis not present

## 2019-09-26 DIAGNOSIS — Z9189 Other specified personal risk factors, not elsewhere classified: Secondary | ICD-10-CM

## 2019-09-26 DIAGNOSIS — E669 Obesity, unspecified: Secondary | ICD-10-CM | POA: Diagnosis not present

## 2019-09-26 DIAGNOSIS — R0602 Shortness of breath: Secondary | ICD-10-CM | POA: Diagnosis not present

## 2019-09-26 DIAGNOSIS — E1169 Type 2 diabetes mellitus with other specified complication: Secondary | ICD-10-CM | POA: Diagnosis not present

## 2019-09-26 DIAGNOSIS — Z6831 Body mass index (BMI) 31.0-31.9, adult: Secondary | ICD-10-CM

## 2019-09-26 NOTE — Progress Notes (Signed)
Chief Complaint:   OBESITY [name] is here to discuss his progress with his obesity treatment plan along with follow-up of his obesity related diagnoses. [name] is on the Category 3 Plan and states he is following his eating plan approximately 70% of the time. [name] states he is walking 2 miles 2-3 times per week.  Today's visit was #: 68 Starting weight: 199 lbs Starting date: 03/14/2018 Today's weight: 197 lbs Today's date: 09/26/2019 Total lbs lost to date: 2 Total lbs lost since last in-office visit: 4  Interim History: [name] states that he has been eating more on plan. He reports eating out less often and taking his lunch to work. His metabolism test today shows a decrease in RMR.  Subjective:   Shortness of breath on exertion. [name] notes increasing shortness of breath with exertion and seems to be worsening over time with weight gain. He notes getting out of breath sooner with activity than he used to. [name] denies shortness of breath at rest or orthopnea. No dizziness or lightheadedness.  Hyperlipidemia associated with type 2 diabetes mellitus ([place]). Fasting blood sugars average 140. [name] is on Victoza, insulin, metformin, and Jardiance. Currently he is managing his diabetes as he doesn't want to see an endocrinologist.  Lab Results  Component Value Date   HGBA1C 9.6 (H) 06/27/2019   HGBA1C 9.1 (H) 02/13/2019   HGBA1C 10.2 (H) 07/18/2018   Lab Results  Component Value Date   MICROALBUR 1.08 12/20/2013   [name] 80 06/27/2019   CREATININE 0.79 06/27/2019   No results found for: INSULIN  At risk for heart disease. [name] is at a higher than average risk for cardiovascular disease due to obesity.   Assessment/Plan:   Shortness of breath on exertion. [name]'s shortness of breath appears to be obesity related and exercise induced. He has agreed to work on weight loss and gradually increase exercise to treat his exercise induced shortness of breath. Will continue to  monitor closely. IC performed. Hemoglobin A1c, VITAMIN D 25 Hydroxy (Vit-D Deficiency, Fractures) labs ordered today.  Hyperlipidemia associated with type 2 diabetes mellitus ([place]). Good blood sugar control is important to decrease the likelihood of diabetic complications such as nephropathy, neuropathy, limb loss, blindness, coronary artery disease, and death. Intensive lifestyle modification including diet, exercise and weight loss are the first line of treatment for diabetes. IC, comprehensive metabolic panel, Hemoglobin A1c, Lipid Panel With LDL/HDL Ratio tests ordered today.  At risk for heart disease. [name] was given approximately 15 minutes of coronary artery disease prevention counseling today. He is 54 y.o. male and has risk factors for heart disease including obesity. We discussed intensive lifestyle modifications today with an emphasis on specific weight loss instructions and strategies.   Repetitive spaced learning was employed today to elicit superior memory formation and behavioral change.  Class 1 obesity with serious comorbidity and body mass index (BMI) of 31.0 to 31.9 in adult, unspecified obesity type.  [name] is currently in the action stage of change. As such, his goal is to continue with weight loss efforts. He has agreed to the Category 3 Plan with only 100-200 snack calories instead of 300 snack calories (modified Category 3 plan).   Exercise goals: For substantial health benefits, adults should do at least 150 minutes (2 hours and 30 minutes) a week of moderate-intensity, or 75 minutes (1 hour and 15 minutes) a week of vigorous-intensity aerobic physical activity, or an equivalent combination of moderate- and vigorous-intensity aerobic activity. Aerobic activity  should be performed in episodes of at least 10 minutes, and preferably, it should be spread throughout the week.  Behavioral modification strategies: no skipping meals and meal planning and cooking strategies.  [name]  has agreed to follow-up with our clinic in 3 weeks. He was informed of the importance of frequent follow-up visits to maximize his success with intensive lifestyle modifications for his multiple health conditions.   [name] was informed we would discuss his lab results at his next visit unless there is a critical issue that needs to be addressed sooner. [name] agreed to keep his next visit at the agreed upon time to discuss these results.  Objective:   Blood pressure (!) 143/80, pulse 93, temperature 98.5 F (36.9 C), temperature source Oral, height 5\' 6"  (1.676 m), weight 197 lb (89.4 kg), SpO2 96 %. Body mass index is 31.8 kg/m.  General: Cooperative, alert, well developed, in no acute distress. HEENT: Conjunctivae and lids unremarkable. Cardiovascular: Regular rhythm.  Lungs: Normal work of breathing. Neurologic: No focal deficits.   Lab Results  Component Value Date   CREATININE 0.79 06/27/2019   BUN 16 06/27/2019   NA 145 (H) 06/27/2019   K 4.1 06/27/2019   CL 102 06/27/2019   CO2 26 06/27/2019   Lab Results  Component Value Date   ALT 21 06/27/2019   AST 17 06/27/2019   ALKPHOS 66 06/27/2019   BILITOT 0.3 06/27/2019   Lab Results  Component Value Date   HGBA1C 9.6 (H) 06/27/2019   HGBA1C 9.1 (H) 02/13/2019   HGBA1C 10.2 (H) 07/18/2018   HGBA1C 10.4 (H) 03/14/2018   HGBA1C 9.8 (A) 12/13/2017   No results found for: INSULIN Lab Results  Component Value Date   TSH 1.240 02/13/2019   Lab Results  Component Value Date   CHOL 162 06/27/2019   HDL 44 06/27/2019   LDLCALC 80 06/27/2019   TRIG 232 (H) 06/27/2019   CHOLHDL 5.5 (H) 03/14/2019   Lab Results  Component Value Date   WBC 4.9 03/14/2019   HGB 15.0 03/14/2019   HCT 45.0 03/14/2019   MCV 84.6 03/14/2019   PLT 286 03/14/2019   No results found for: IRON, TIBC, FERRITIN  Attestation Statements:   Reviewed by clinician on day of visit: allergies, medications, problem list, medical history, surgical  history, family history, social history, and previous encounter notes.  IMichaelene Song, am acting as transcriptionist for Abby Potash, PA-C   I have reviewed the above documentation for accuracy and completeness, and I agree with the above. Abby Potash, PA-C

## 2019-09-27 LAB — COMPREHENSIVE METABOLIC PANEL
ALT: 27 IU/L (ref 0–44)
AST: 21 IU/L (ref 0–40)
Albumin/Globulin Ratio: 2.1 (ref 1.2–2.2)
Albumin: 4.5 g/dL (ref 3.8–4.9)
Alkaline Phosphatase: 59 IU/L (ref 39–117)
BUN/Creatinine Ratio: 20 (ref 9–20)
BUN: 16 mg/dL (ref 6–24)
Bilirubin Total: 0.3 mg/dL (ref 0.0–1.2)
CO2: 25 mmol/L (ref 20–29)
Calcium: 9.6 mg/dL (ref 8.7–10.2)
Chloride: 102 mmol/L (ref 96–106)
Creatinine, Ser: 0.82 mg/dL (ref 0.76–1.27)
GFR calc Af Amer: 116 mL/min/{1.73_m2} (ref >59)
GFR calc non Af Amer: 100 mL/min/{1.73_m2} (ref >59)
Globulin, Total: 2.1 g/dL (ref 1.5–4.5)
Glucose: 208 mg/dL — ABNORMAL HIGH (ref 65–99)
Potassium: 4.5 mmol/L (ref 3.5–5.2)
Sodium: 142 mmol/L (ref 134–144)
Total Protein: 6.6 g/dL (ref 6.0–8.5)

## 2019-09-27 LAB — LIPID PANEL WITH LDL/HDL RATIO
Cholesterol, Total: 161 mg/dL (ref 100–199)
HDL: 40 mg/dL (ref >39)
LDL Chol Calc (NIH): 88 mg/dL (ref 0–99)
LDL/HDL Ratio: 2.2 ratio (ref 0.0–3.6)
Triglycerides: 193 mg/dL — ABNORMAL HIGH (ref 0–149)
VLDL Cholesterol Cal: 33 mg/dL (ref 5–40)

## 2019-09-27 LAB — HEMOGLOBIN A1C
Est. average glucose Bld gHb Est-mCnc: 183 mg/dL
Hgb A1c MFr Bld: 8 % — ABNORMAL HIGH (ref 4.8–5.6)

## 2019-09-27 LAB — VITAMIN D 25 HYDROXY (VIT D DEFICIENCY, FRACTURES): Vit D, 25-Hydroxy: 45 ng/mL (ref 30.0–100.0)

## 2019-09-28 MED FILL — ODEFSEY 200-25-25 MG TABS: 200-25-25 | 30 days supply | Qty: 30 | Fill #4

## 2019-10-03 ENCOUNTER — Ambulatory Visit: Payer: 59 | Admitting: Cardiology

## 2019-10-09 MED FILL — BASAGLAR 100 UNIT/ML KWIKPE: 100 | 30 days supply | Qty: 42 | Fill #0

## 2019-10-20 ENCOUNTER — Encounter (INDEPENDENT_AMBULATORY_CARE_PROVIDER_SITE_OTHER): Payer: Self-pay | Admitting: Physician Assistant

## 2019-10-24 ENCOUNTER — Ambulatory Visit (INDEPENDENT_AMBULATORY_CARE_PROVIDER_SITE_OTHER): Payer: 59 | Admitting: Physician Assistant

## 2019-10-24 NOTE — Telephone Encounter (Signed)
Please r/s

## 2019-10-25 MED FILL — ODEFSEY 200-25-25 MG TABS: 200-25-25 | 30 days supply | Qty: 30 | Fill #5

## 2019-10-26 MED FILL — FREESTYLE LIBRE 14 DAY SENS: 84 days supply | Qty: 6 | Fill #0

## 2019-11-07 ENCOUNTER — Encounter (INDEPENDENT_AMBULATORY_CARE_PROVIDER_SITE_OTHER): Payer: Self-pay | Admitting: Physician Assistant

## 2019-11-21 ENCOUNTER — Emergency Department (HOSPITAL_COMMUNITY): Payer: 59

## 2019-11-21 ENCOUNTER — Ambulatory Visit (INDEPENDENT_AMBULATORY_CARE_PROVIDER_SITE_OTHER): Payer: 59 | Admitting: Physician Assistant

## 2019-11-21 ENCOUNTER — Encounter (HOSPITAL_COMMUNITY): Payer: Self-pay | Admitting: Emergency Medicine

## 2019-11-21 ENCOUNTER — Observation Stay (HOSPITAL_COMMUNITY)
Admission: EM | Admit: 2019-11-21 | Discharge: 2019-11-22 | Disposition: A | Discharge status: Home / Self Care | Payer: 59 | Attending: Family Medicine | Admitting: Family Medicine

## 2019-11-21 ENCOUNTER — Other Ambulatory Visit: Payer: Self-pay

## 2019-11-21 DIAGNOSIS — E1159 Type 2 diabetes mellitus with other circulatory complications: Secondary | ICD-10-CM

## 2019-11-21 DIAGNOSIS — E785 Hyperlipidemia, unspecified: Secondary | ICD-10-CM | POA: Insufficient documentation

## 2019-11-21 DIAGNOSIS — Z794 Long term (current) use of insulin: Secondary | ICD-10-CM | POA: Diagnosis not present

## 2019-11-21 DIAGNOSIS — Z79899 Other long term (current) drug therapy: Secondary | ICD-10-CM | POA: Insufficient documentation

## 2019-11-21 DIAGNOSIS — I252 Old myocardial infarction: Secondary | ICD-10-CM | POA: Diagnosis not present

## 2019-11-21 DIAGNOSIS — Z7982 Long term (current) use of aspirin: Secondary | ICD-10-CM | POA: Diagnosis not present

## 2019-11-21 DIAGNOSIS — Z955 Presence of coronary angioplasty implant and graft: Secondary | ICD-10-CM | POA: Insufficient documentation

## 2019-11-21 DIAGNOSIS — R1110 Vomiting, unspecified: Secondary | ICD-10-CM | POA: Insufficient documentation

## 2019-11-21 DIAGNOSIS — Z8249 Family history of ischemic heart disease and other diseases of the circulatory system: Secondary | ICD-10-CM | POA: Diagnosis not present

## 2019-11-21 DIAGNOSIS — N179 Acute kidney failure, unspecified: Secondary | ICD-10-CM | POA: Insufficient documentation

## 2019-11-21 DIAGNOSIS — R42 Dizziness and giddiness: Secondary | ICD-10-CM | POA: Diagnosis not present

## 2019-11-21 DIAGNOSIS — I2510 Atherosclerotic heart disease of native coronary artery without angina pectoris: Secondary | ICD-10-CM | POA: Diagnosis present

## 2019-11-21 DIAGNOSIS — Z20822 Contact with and (suspected) exposure to covid-19: Secondary | ICD-10-CM | POA: Diagnosis not present

## 2019-11-21 DIAGNOSIS — E1110 Type 2 diabetes mellitus with ketoacidosis without coma: Secondary | ICD-10-CM | POA: Diagnosis not present

## 2019-11-21 DIAGNOSIS — E860 Dehydration: Secondary | ICD-10-CM | POA: Insufficient documentation

## 2019-11-21 DIAGNOSIS — K5900 Constipation, unspecified: Secondary | ICD-10-CM | POA: Diagnosis not present

## 2019-11-21 DIAGNOSIS — E1165 Type 2 diabetes mellitus with hyperglycemia: Secondary | ICD-10-CM

## 2019-11-21 DIAGNOSIS — B20 Human immunodeficiency virus [HIV] disease: Secondary | ICD-10-CM | POA: Diagnosis present

## 2019-11-21 DIAGNOSIS — I10 Essential (primary) hypertension: Secondary | ICD-10-CM | POA: Diagnosis not present

## 2019-11-21 DIAGNOSIS — I2583 Coronary atherosclerosis due to lipid rich plaque: Secondary | ICD-10-CM | POA: Insufficient documentation

## 2019-11-21 DIAGNOSIS — Z21 Asymptomatic human immunodeficiency virus [HIV] infection status: Secondary | ICD-10-CM | POA: Insufficient documentation

## 2019-11-21 DIAGNOSIS — R112 Nausea with vomiting, unspecified: Secondary | ICD-10-CM | POA: Diagnosis not present

## 2019-11-21 LAB — CBC WITH DIFFERENTIAL/PLATELET
Abs Immature Granulocytes: 0.06 10*3/uL (ref 0.00–0.07)
Basophils Absolute: 0 10*3/uL (ref 0.0–0.1)
Basophils Relative: 0 %
Eosinophils Absolute: 0 10*3/uL (ref 0.0–0.5)
Eosinophils Relative: 0 %
HCT: 49.5 % (ref 39.0–52.0)
Hemoglobin: 15.8 g/dL (ref 13.0–17.0)
Immature Granulocytes: 0 %
Lymphocytes Relative: 13 %
Lymphs Abs: 1.7 10*3/uL (ref 0.7–4.0)
MCH: 27.9 pg (ref 26.0–34.0)
MCHC: 31.9 g/dL (ref 30.0–36.0)
MCV: 87.5 fL (ref 80.0–100.0)
Monocytes Absolute: 1.3 10*3/uL — ABNORMAL HIGH (ref 0.1–1.0)
Monocytes Relative: 10 %
Neutro Abs: 10.3 10*3/uL — ABNORMAL HIGH (ref 1.7–7.7)
Neutrophils Relative %: 77 %
Platelets: 389 10*3/uL (ref 150–400)
RBC: 5.66 MIL/uL (ref 4.22–5.81)
RDW: 13.2 % (ref 11.5–15.5)
WBC: 13.4 10*3/uL — ABNORMAL HIGH (ref 4.0–10.5)
nRBC: 0 % (ref 0.0–0.2)

## 2019-11-21 LAB — BLOOD GAS, VENOUS
Acid-base deficit: 10.9 mmol/L — ABNORMAL HIGH (ref 0.0–2.0)
Bicarbonate: 15.5 mmol/L — ABNORMAL LOW (ref 20.0–28.0)
FIO2: 99
O2 Saturation: 72.4 %
Patient temperature: 37.1
pCO2, Ven: 32.5 mmHg — ABNORMAL LOW (ref 44.0–60.0)
pH, Ven: 7.273 (ref 7.250–7.430)
pO2, Ven: 45.4 mmHg — ABNORMAL HIGH (ref 32.0–45.0)

## 2019-11-21 LAB — BASIC METABOLIC PANEL
Anion gap: 17 — ABNORMAL HIGH (ref 5–15)
BUN: 29 mg/dL — ABNORMAL HIGH (ref 6–20)
CO2: 15 mmol/L — ABNORMAL LOW (ref 22–32)
Calcium: 8.5 mg/dL — ABNORMAL LOW (ref 8.9–10.3)
Chloride: 107 mmol/L (ref 98–111)
Creatinine, Ser: 1.63 mg/dL — ABNORMAL HIGH (ref 0.61–1.24)
GFR calc Af Amer: 55 mL/min — ABNORMAL LOW (ref >60)
GFR calc non Af Amer: 47 mL/min — ABNORMAL LOW (ref >60)
Glucose, Bld: 402 mg/dL — ABNORMAL HIGH (ref 70–99)
Potassium: 4.3 mmol/L (ref 3.5–5.1)
Sodium: 139 mmol/L (ref 135–145)

## 2019-11-21 LAB — COMPREHENSIVE METABOLIC PANEL
ALT: 35 U/L (ref 0–44)
AST: 19 U/L (ref 15–41)
Albumin: 4.4 g/dL (ref 3.5–5.0)
Alkaline Phosphatase: 67 U/L (ref 38–126)
Anion gap: 17 — ABNORMAL HIGH (ref 5–15)
BUN: 28 mg/dL — ABNORMAL HIGH (ref 6–20)
CO2: 21 mmol/L — ABNORMAL LOW (ref 22–32)
Calcium: 9.6 mg/dL (ref 8.9–10.3)
Chloride: 102 mmol/L (ref 98–111)
Creatinine, Ser: 2.38 mg/dL — ABNORMAL HIGH (ref 0.61–1.24)
GFR calc Af Amer: 35 mL/min — ABNORMAL LOW (ref >60)
GFR calc non Af Amer: 30 mL/min — ABNORMAL LOW (ref >60)
Glucose, Bld: 232 mg/dL — ABNORMAL HIGH (ref 70–99)
Potassium: 4.3 mmol/L (ref 3.5–5.1)
Sodium: 140 mmol/L (ref 135–145)
Total Bilirubin: 1 mg/dL (ref 0.3–1.2)
Total Protein: 8.1 g/dL (ref 6.5–8.1)

## 2019-11-21 LAB — CBG MONITORING, ED
Glucose-Capillary: 220 mg/dL — ABNORMAL HIGH (ref 70–99)
Glucose-Capillary: 407 mg/dL — ABNORMAL HIGH (ref 70–99)
Glucose-Capillary: 421 mg/dL — ABNORMAL HIGH (ref 70–99)
Glucose-Capillary: 479 mg/dL — ABNORMAL HIGH (ref 70–99)

## 2019-11-21 LAB — SARS CORONAVIRUS 2 BY RT PCR (HOSPITAL ORDER, PERFORMED IN ~~LOC~~ HOSPITAL LAB): SARS Coronavirus 2: NEGATIVE

## 2019-11-21 MED ORDER — ONDANSETRON HCL 4 MG/2ML IJ SOLN
4.0000 mg | Freq: Once | INTRAMUSCULAR | Status: AC
  Administered 2019-11-21: 4 mg via INTRAVENOUS
  Filled 2019-11-21: qty 2

## 2019-11-21 MED ORDER — SODIUM CHLORIDE 0.9 % IV BOLUS
1000.0000 mL | Freq: Once | INTRAVENOUS | Status: AC
  Administered 2019-11-21: 1000 mL via INTRAVENOUS

## 2019-11-21 MED ORDER — ACETAMINOPHEN 650 MG RE SUPP: 650.0000 mg | Freq: Four times a day (QID) | RECTAL | Status: DC | PRN

## 2019-11-21 MED ORDER — ROSUVASTATIN CALCIUM 20 MG PO TABS
40.0000 mg | ORAL_TABLET | Freq: Every day | ORAL | Status: DC
  Administered 2019-11-22: 40 mg via ORAL
  Filled 2019-11-21 (×2): qty 1
  Filled 2019-11-21: qty 2
  Filled 2019-11-21: qty 1

## 2019-11-21 MED ORDER — DEXTROSE 50 % IV SOLN: 0.0000 mL | INTRAVENOUS | Status: DC | PRN

## 2019-11-21 MED ORDER — METOPROLOL SUCCINATE ER 50 MG PO TB24
100.0000 mg | ORAL_TABLET | Freq: Every day | ORAL | Status: DC
  Administered 2019-11-22: 100 mg via ORAL
  Filled 2019-11-21: qty 2

## 2019-11-21 MED ORDER — ONDANSETRON HCL 4 MG/2ML IJ SOLN: 4.0000 mg | Freq: Four times a day (QID) | INTRAMUSCULAR | Status: DC | PRN

## 2019-11-21 MED ORDER — DEXTROSE-NACL 5-0.45 % IV SOLN: INTRAVENOUS | Status: DC

## 2019-11-21 MED ORDER — HEPARIN SODIUM (PORCINE) 5000 UNIT/ML IJ SOLN
5000.0000 [IU] | Freq: Three times a day (TID) | INTRAMUSCULAR | Status: DC
  Administered 2019-11-22: 5000 [IU] via SUBCUTANEOUS
  Filled 2019-11-21: qty 1

## 2019-11-21 MED ORDER — ASPIRIN EC 81 MG PO TBEC: 81.0000 mg | DELAYED_RELEASE_TABLET | Freq: Every evening | ORAL | Status: DC

## 2019-11-21 MED ORDER — SODIUM CHLORIDE 0.9 % IV SOLN: INTRAVENOUS | Status: DC

## 2019-11-21 MED ORDER — POTASSIUM CHLORIDE 10 MEQ/100ML IV SOLN
10.0000 meq | INTRAVENOUS | Status: AC
  Administered 2019-11-21 (×2): 10 meq via INTRAVENOUS
  Filled 2019-11-21 (×2): qty 100

## 2019-11-21 MED ORDER — PANTOPRAZOLE SODIUM 40 MG IV SOLR
40.0000 mg | Freq: Once | INTRAVENOUS | Status: AC
  Administered 2019-11-21: 40 mg via INTRAVENOUS
  Filled 2019-11-21: qty 40

## 2019-11-21 MED ORDER — EMTRICITAB-RILPIVIR-TENOFOV AF 200-25-25 MG PO TABS
1.0000 | ORAL_TABLET | Freq: Every day | ORAL | Status: DC
  Filled 2019-11-21 (×4): qty 1

## 2019-11-21 MED ORDER — POLYETHYLENE GLYCOL 3350 17 G PO PACK: 17.0000 g | PACK | Freq: Every day | ORAL | Status: DC | PRN

## 2019-11-21 MED ORDER — INSULIN REGULAR(HUMAN) IN NACL 100-0.9 UT/100ML-% IV SOLN
INTRAVENOUS | Status: DC
  Administered 2019-11-21: 13 [IU]/h via INTRAVENOUS
  Administered 2019-11-21: 16 [IU]/h via INTRAVENOUS
  Filled 2019-11-21: qty 100

## 2019-11-21 MED ORDER — ONDANSETRON HCL 4 MG PO TABS: 4.0000 mg | ORAL_TABLET | Freq: Four times a day (QID) | ORAL | Status: DC | PRN

## 2019-11-21 MED ORDER — ACETAMINOPHEN 325 MG PO TABS: 650.0000 mg | ORAL_TABLET | Freq: Four times a day (QID) | ORAL | Status: DC | PRN

## 2019-11-21 NOTE — Telephone Encounter (Signed)
Please review

## 2019-11-21 NOTE — ED Triage Notes (Signed)
C/O sudden onset of n/v since yesterday, denies fevers and abd pain

## 2019-11-21 NOTE — ED Notes (Signed)
ED TO INPATIENT HANDOFF REPORT  ED Nurse Name and Phone #:   S Name/Age/Gender [name] 55 y.o. male Room/Bed: APFT23/APFT23  Code Status   Code Status: Prior  Home/SNF/Other Home Patient oriented to: self, place, time and situation Is this baseline? Yes   Triage Complete: Triage complete  Chief Complaint AKI (acute kidney injury) ([place]) [N17.9]  Triage Note C/O sudden onset of n/v since yesterday, denies fevers and abd pain     Allergies Allergies  Allergen Reactions  . Pepcid [Famotidine] Other (See Comments)    Contraindicated with ODEFSEY  . Prilosec [Omeprazole] Other (See Comments)    Contraindicated with RPV (lowers level of this ARV in ODEFSEY    Level of Care/Admitting Diagnosis ED Disposition    ED Disposition Condition [place]: [facility] [[id number]]  Level of Care: Telemetry [5]  Covid Evaluation: Asymptomatic Screening Protocol (No Symptoms)  Diagnosis: AKI (acute kidney injury) [facility]) [[id number]]  Admitting Physician: Bethena Roys [phone]  Attending Physician: Bethena Roys [email or web address]       B Medical/Surgery History Past Medical History:  Diagnosis Date  . CAD (coronary artery disease)    DES.  /  ... Later (10/2007) intervention for in-stent restenosis ;  EF 60%... catheterization... June, 2009  . Chest pain   . Diabetes mellitus type 2, uncontrolled ([place]) 09/27/2014  . Diabetes mellitus type II   . DKA (diabetic ketoacidoses) ([place]) 12/2010; 03/23/2017  . Dyslipidemia   . Ejection fraction    EF 60%, catheterization, 2009  . Gallbladder problem   . History of heart attack   . History of kidney stones   . HIV (human immunodeficiency virus infection) ([place]) dx'd ~ 2002/2003  . HLD (hyperlipidemia)   . HTN (hypertension)   . 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  . Nausea and vomiting 03/23/2017  . 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 Sinus tachycardia    mild at rest.... October, 2010  . Syphilitic hepatitis    Past Surgical History:  Procedure Laterality Date  . CHOLECYSTECTOMY OPEN  07/2001  . COLONOSCOPY N/A 05/28/2017   Procedure: COLONOSCOPY;  Surgeon: Rogene Houston, MD;  Location: [facility] SU[facility];  Service: Endoscopy;  Laterality: N/A;  200 - pt to prep in Endo  . CORONARY ANGIOPLASTY WITH STENT PLACEMENT  03/2006; 10/2007;   . HERNIA REPAIR    . LAPAROSCOPIC INCISIONAL / UMBILICAL / VENTRAL HERNIA REPAIR  07/2001   UHR  . POLYPECTOMY  05/28/2017   Procedure: POLYPECTOMY;  Surgeon: Rogene Houston, MD;  Location: [facility];  Service: Endoscopy;;  colon     A IV Location/Drains/Wounds Patient Lines/Drains/Airways Status    Active Line/Drains/Airways    Name Placement date Placement time Site Days   Peripheral IV 11/21/19 Right Antecubital 11/21/19  1617  Antecubital  less than 1          Intake/Output Last 24 hours  Intake/Output Summary (Last 24 hours) at 11/21/2019 2128 Last data filed at 11/21/2019 1942 Gross per 24 hour  Intake 2000 ml  Output --  Net 2000 ml    Labs/Imaging Results for orders placed or performed during the hospital encounter of 11/21/19 (from the past 48 hour(s))  CBG monitoring, ED     Status: Abnormal   Collection Time: 11/21/19  3:43 PM  Result Value Ref Range  Glucose-Capillary 220 (H) 70 - 99 mg/dL    Comment: Glucose reference range applies only to samples taken after fasting for at least 8 hours.  CBC with Differential/Platelet     Status: Abnormal   Collection Time: 11/21/19  4:07 PM  Result Value Ref Range   WBC 13.4 (H) 4.0 - 10.5 K/uL   RBC 5.66 4.22 - 5.81 MIL/uL   Hemoglobin 15.8 13.0 - 17.0 g/dL   HCT 49.5 39 - 52 %   MCV 87.5 80.0 - 100.0 fL   MCH 27.9 26.0 - 34.0 pg   MCHC 31.9 30.0 - 36.0 g/dL   RDW 13.2 11.5 - 15.5 %   Platelets 389 150 - 400 K/uL   nRBC 0.0 0.0 - 0.2 %    Neutrophils Relative % 77 %   Neutro Abs 10.3 (H) 1.7 - 7.7 K/uL   Lymphocytes Relative 13 %   Lymphs Abs 1.7 0.7 - 4.0 K/uL   Monocytes Relative 10 %   Monocytes Absolute 1.3 (H) 0 - 1 K/uL   Eosinophils Relative 0 %   Eosinophils Absolute 0.0 0 - 0 K/uL   Basophils Relative 0 %   Basophils Absolute 0.0 0 - 0 K/uL   Immature Granulocytes 0 %   Abs Immature Granulocytes 0.06 0.00 - 0.07 K/uL    Comment: Performed at [facility], [address], [place], [place] [address]  Comprehensive metabolic panel     Status: Abnormal   Collection Time: 11/21/19  4:07 PM  Result Value Ref Range   Sodium 140 135 - 145 mmol/L   Potassium 4.3 3.5 - 5.1 mmol/L   Chloride 102 98 - 111 mmol/L   CO2 21 (L) 22 - 32 mmol/L   Glucose, Bld 232 (H) 70 - 99 mg/dL    Comment: Glucose reference range applies only to samples taken after fasting for at least 8 hours.   BUN 28 (H) 6 - 20 mg/dL   Creatinine, Ser 2.38 (H) 0.61 - 1.24 mg/dL   Calcium 9.6 8.9 - 10.3 mg/dL   Total Protein 8.1 6.5 - 8.1 g/dL   Albumin 4.4 3.5 - 5.0 g/dL   AST 19 15 - 41 U/L   ALT 35 0 - 44 U/L   Alkaline Phosphatase 67 38 - 126 U/L   Total Bilirubin 1.0 0.3 - 1.2 mg/dL   GFR calc non Af Amer 30 (L) >60 mL/min   GFR calc Af Amer 35 (L) >60 mL/min   Anion gap 17 (H) 5 - 15    Comment: Performed at [facility], [address] [place], [place] [address]   DG ABD ACUTE 2+V W 1V CHEST  Result Date: 11/21/2019 CLINICAL DATA:  Nausea and vomiting.  Dizziness. EXAM: DG ABDOMEN ACUTE W/ 1V CHEST COMPARISON:  Chest radiograph March 23, 2017; CT abdomen and pelvis January 04, 2011. FINDINGS: PA chest: Lungs are clear. Heart size and pulmonary vascularity are normal. No adenopathy. Supine and upright abdomen: There is moderate stool in the colon. There is no bowel dilatation or air-fluid level to suggest bowel obstruction. No free air. Surgical clips are noted in the right upper quadrant region. IMPRESSION: No bowel obstruction or free  air. Moderate stool in colon. Surgical clips gallbladder fossa region. No edema or airspace opacity. Electronically Signed   By: Lowella Grip III M.D.   On: 11/21/2019 18:55    Pending Labs [employer] (From admission, onward) Comment          Start  Ordered   11/21/19 2057  SARS Coronavirus 2 by RT PCR (hospital order, performed in [place] hospital lab) Nasopharyngeal Nasopharyngeal Swab  (Tier 2 (TAT 2 hrs))  Once,   STAT       Question Answer Comment  Is this test for diagnosis or screening Screening   Symptomatic for COVID-19 as defined by CDC No   Hospitalized for COVID-19 No   Admitted to ICU for COVID-19 No   Previously tested for COVID-19 No   Resident in a congregate (group) care setting No   Employed in healthcare setting No   Has patient completed COVID vaccination(s) (2 doses of Pfizer/Moderna 1 dose of [employer]) Unknown      11/21/19 2056   11/21/19 6503  Basic metabolic panel  ONCE - STAT,   STAT        11/21/19 2004          Vitals/Pain Today's Vitals   11/21/19 1523  BP: 135/87  Pulse: (!) 123  Resp: 18  Temp: 98.7 F (37.1 C)  TempSrc: Oral  SpO2: 96%  Weight: 86.2 kg  Height: 5\' 6"  (1.676 m)  PainSc: 0-No pain    Isolation Precautions No active isolations  Medications Medications  ondansetron (ZOFRAN) injection 4 mg (has no administration in time range)  sodium chloride 0.9 % bolus 1,000 mL (0 mLs Intravenous Stopped 11/21/19 1748)  ondansetron (ZOFRAN) injection 4 mg (4 mg Intravenous Given 11/21/19 1620)  pantoprazole (PROTONIX) injection 40 mg (40 mg Intravenous Given 11/21/19 1620)  sodium chloride 0.9 % bolus 1,000 mL (0 mLs Intravenous Stopped 11/21/19 1942)    Mobility walks Low fall risk   Focused Assessments     R Recommendations: See Admitting Provider Note  Report given to:   Additional Notes:

## 2019-11-21 NOTE — ED Provider Notes (Signed)
[facility] EMERGENCY DEPARTMENT Provider Note   CSN: [id number] Arrival date & time: 11/21/19  1455     History Chief Complaint  Patient presents with  . Emesis    [name] is a 54 y.o. male.  Patient has a history of diabetes and has been vomiting since yesterday no pain  The history is provided by the patient. No language interpreter was used.  Emesis Severity:  Mild Timing:  Constant Quality:  Bilious material Able to tolerate:  Liquids Progression:  Unchanged Chronicity:  New Recent urination:  Normal Context: not post-tussive   Relieved by:  Nothing Worsened by:  Nothing Ineffective treatments:  None tried Associated symptoms: no abdominal pain, no cough, no diarrhea and no headaches        Past Medical History:  Diagnosis Date  . CAD (coronary artery disease)    DES.  /  ... Later (10/2007) intervention for in-stent restenosis ;  EF 60%... catheterization... June, 2009  . Chest pain   . Diabetes mellitus type 2, uncontrolled ([place]) 09/27/2014  . Diabetes mellitus type II   . DKA (diabetic ketoacidoses) ([place]) 12/2010; 03/23/2017  . Dyslipidemia   . Ejection fraction    EF 60%, catheterization, 2009  . Gallbladder problem   . History of heart attack   . History of kidney stones   . HIV (human immunodeficiency virus infection) ([place]) dx'd ~ 2002/2003  . HLD (hyperlipidemia)   . HTN (hypertension)   . 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  . Nausea and vomiting 03/23/2017  . 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 RPR  reverted to negative  . Sinus tachycardia    mild at rest.... October, 2010  . Syphilitic hepatitis     Patient Active Problem List   Diagnosis Date Noted  . Other fatigue 03/14/2018  . Shortness of breath on exertion 03/14/2018  . Uncontrolled type 2 diabetes mellitus with hyperglycemia ([place]) 02/08/2018  . Mixed hyperlipidemia  02/08/2018  . AKI (acute kidney injury) ([place]) 03/23/2017  . Special screening for malignant neoplasms, colon 11/18/2016  . Family history of colon cancer 11/18/2016  . Lung nodule, multiple 09/27/2014  . Type 2 diabetes mellitus without complication, with long-term current use of insulin ([place]) 09/27/2014  . Early syphilis, 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  . Hypercholesterolemia 01/25/2013  . Ejection fraction   . Essential hypertension, benign   . Human immunodeficiency virus (HIV) disease ([place]) 09/11/2011  . Kidney stone on left side 01/04/2011  . DKA (diabetic ketoacidoses) ([place]) 01/04/2011  . Coronary artery disease due to lipid rich plaque   . Secondary syphilis   . LFTs abnormal     Past Surgical History:  Procedure Laterality Date  . CHOLECYSTECTOMY OPEN  07/2001  . COLONOSCOPY N/A 05/28/2017   Procedure: COLONOSCOPY;  Surgeon: Rogene Houston, MD;  Location: [facility] ENDO SUITE;  Service: Endoscopy;  Laterality: N/A;  200 - pt to prep in Endo  . CORONARY ANGIOPLASTY WITH STENT PLACEMENT  03/2006; 10/2007;   . HERNIA REPAIR    . LAPAROSCOPIC INCISIONAL / UMBILICAL / VENTRAL HERNIA REPAIR  07/2001   UHR  . POLYPECTOMY  05/28/2017   Procedure: POLYPECTOMY;  Surgeon: Rogene Houston, MD;  Location: [facility] ENDO SUITE;  Service: Endoscopy;;  colon       Family History  Problem Relation Age of Onset  . Heart attack Father        7 heart attacks  .  CAD Father   . Alcoholism Father   . Hyperlipidemia Mother   . Hypertension Mother   . Anemia Other   . Diabetes Other   . CAD Sister   . Diabetes Sister     Social History   Tobacco Use  . Smoking status: Never Smoker  . Smokeless tobacco: Never Used  Vaping Use  . Vaping Use: Never used  Substance Use Topics  . Alcohol use: Yes    Alcohol/week: 0.0 - 1.0 standard drinks    Comment: Social drinker  . Drug use: No    Home Medications Prior to Admission medications   Medication Sig Start Date End Date Taking? Authorizing  Provider  aspirin 81 MG tablet Take 1 tablet (81 mg total) by mouth every evening. 05/29/17   Rehman, Mechele Dawley, MD  chlorthalidone (HYGROTON) 25 MG tablet Take 1 tablet (25 mg total) by mouth daily. 07/19/18   Lyda Jester M, PA-C  Continuous Blood Gluc Sensor (FREESTYLE LIBRE 14 DAY SENSOR) MISC  02/01/19   [provider]  empagliflozin (JARDIANCE) 25 MG TABS tablet Take 25 mg by mouth daily. 06/27/19   Abby Potash, PA-C  emtricitabine-rilpivir-tenofovir AF (ODEFSEY) 200-25-25 MG TABS tablet Take 1 tablet by mouth daily with breakfast. 05/31/19   Tommy Medal, Lavell Islam, MD  ezetimibe (ZETIA) 10 MG tablet Take 1 tablet (10 mg total) by mouth daily. 07/19/18 10/17/18  Lyda Jester M, PA-C  HUMALOG KWIKPEN 100 UNIT/ML KwikPen  12/13/18   [provider]  insulin glargine (LANTUS SOLOSTAR) 100 UNIT/ML injection Inject 45 Units into the skin at bedtime.     [provider]  insulin lispro (HUMALOG) 100 UNIT/ML injection Inject 10-16 Units into the skin 3 (three) times daily before meals.    [provider]  Insulin Pen Needle (BD PEN NEEDLE NANO 2ND GEN) 32G X 4 MM MISC 1 Package by Does not apply route 2 (two) times daily. 12/12/18   Abby Potash, PA-C  Insulin Pen Needle (BD PEN NEEDLE NANO U/F) 32G X 4 MM MISC 1 Device by Does not apply route daily. 12/26/18   Abby Potash, PA-C  LANTUS SOLOSTAR 100 UNIT/ML Solostar Pen  02/14/19   [provider]  liraglutide (VICTOZA) 18 MG/3ML SOPN Inject 0.3 mLs (1.8 mg total) into the skin every morning. 06/05/19   Abby Potash, PA-C  lisinopril (PRINIVIL,ZESTRIL) 10 MG tablet Take 1 tablet (10 mg total) by mouth daily. 07/19/18   Lyda Jester M, PA-C  metformin (FORTAMET) 1000 MG (OSM) 24 hr tablet Take 1 tablet (1,000 mg total) by mouth 2 (two) times daily with a meal. 06/27/19   Abby Potash, PA-C  metoprolol succinate (TOPROL-XL) 100 MG 24 hr tablet  10/18/18   [provider]  rosuvastatin  (CRESTOR) 40 MG tablet Take 1 tablet (40 mg total) by mouth daily. 07/19/18   Lyda Jester M, PA-C  Vitamin D, Ergocalciferol, (DRISDOL) 1.25 MG (50000 UNIT) CAPS capsule Take 1 capsule (50,000 Units total) by mouth every 3 (three) days. 09/04/19   Abby Potash, PA-C    Allergies    Pepcid [famotidine] and Prilosec [omeprazole]  Review of Systems   Review of Systems  Constitutional: Negative for appetite change and fatigue.  HENT: Negative for congestion, ear discharge and sinus pressure.   Eyes: Negative for discharge.  Respiratory: Negative for cough.   Cardiovascular: Negative for chest pain.  Gastrointestinal: Positive for vomiting. Negative for abdominal pain and diarrhea.  Genitourinary: Negative for frequency and hematuria.  Musculoskeletal:  Negative for back pain.  Skin: Negative for rash.  Neurological: Negative for seizures and headaches.  Psychiatric/Behavioral: Negative for hallucinations.    Physical Exam Updated Vital Signs BP 135/87 (BP Location: Right Arm)   Pulse (!) 123   Temp 98.7 F (37.1 C) (Oral)   Resp 18   Ht 5\' 6"  (1.676 m)   Wt 86.2 kg   SpO2 96%   BMI 30.67 kg/m   Physical Exam Vitals and nursing note reviewed.  Constitutional:      Appearance: He is well-developed.  HENT:     Head: Normocephalic.     Nose: Nose normal.  Eyes:     General: No scleral icterus.    Conjunctiva/sclera: Conjunctivae normal.  Neck:     Thyroid: No thyromegaly.  Cardiovascular:     Rate and Rhythm: Normal rate and regular rhythm.     Heart sounds: No murmur heard.  No friction rub. No gallop.   Pulmonary:     Breath sounds: No stridor. No wheezing or rales.  Chest:     Chest wall: No tenderness.  Abdominal:     General: There is no distension.     Tenderness: There is no abdominal tenderness. There is no rebound.  Musculoskeletal:        General: Normal range of motion.     Cervical back: Neck supple.  Lymphadenopathy:     Cervical: No cervical  adenopathy.  Skin:    Findings: No erythema or rash.  Neurological:     Mental Status: He is alert and oriented to person, place, and time.     Motor: No abnormal muscle tone.     Coordination: Coordination normal.  Psychiatric:        Behavior: Behavior normal.     ED Results / Procedures / Treatments   Labs (all labs ordered are listed, but only abnormal results are displayed) Labs Reviewed  CBC WITH DIFFERENTIAL/PLATELET - Abnormal; Notable for the following components:      Result Value   WBC 13.4 (*)    Neutro Abs 10.3 (*)    Monocytes Absolute 1.3 (*)    All other components within normal limits  COMPREHENSIVE METABOLIC PANEL - Abnormal; Notable for the following components:   CO2 21 (*)    Glucose, Bld 232 (*)    BUN 28 (*)    Creatinine, Ser 2.38 (*)    GFR calc non Af Amer 30 (*)    GFR calc Af Amer 35 (*)    Anion gap 17 (*)    All other components within normal limits  CBG MONITORING, ED - Abnormal; Notable for the following components:   Glucose-Capillary 220 (*)    All other components within normal limits  BASIC METABOLIC PANEL    EKG None  Radiology DG ABD ACUTE 2+V W 1V CHEST  Result Date: 11/21/2019 CLINICAL DATA:  Nausea and vomiting.  Dizziness. EXAM: DG ABDOMEN ACUTE W/ 1V CHEST COMPARISON:  Chest radiograph March 23, 2017; CT abdomen and pelvis January 04, 2011. FINDINGS: PA chest: Lungs are clear. Heart size and pulmonary vascularity are normal. No adenopathy. Supine and upright abdomen: There is moderate stool in the colon. There is no bowel dilatation or air-fluid level to suggest bowel obstruction. No free air. Surgical clips are noted in the right upper quadrant region. IMPRESSION: No bowel obstruction or free air. Moderate stool in colon. Surgical clips gallbladder fossa region. No edema or airspace opacity. Electronically Signed   By: Lowella Grip  III M.D.   On: 11/21/2019 18:55    Procedures Procedures (including critical care  time)  Medications Ordered in ED Medications  ondansetron (ZOFRAN) injection 4 mg (has no administration in time range)  sodium chloride 0.9 % bolus 1,000 mL (0 mLs Intravenous Stopped 11/21/19 1748)  ondansetron (ZOFRAN) injection 4 mg (4 mg Intravenous Given 11/21/19 1620)  pantoprazole (PROTONIX) injection 40 mg (40 mg Intravenous Given 11/21/19 1620)  sodium chloride 0.9 % bolus 1,000 mL (0 mLs Intravenous Stopped 11/21/19 1942)    ED Course  I have reviewed the triage vital signs and the nursing notes.  Pertinent labs & imaging results that were available during my care of the patient were reviewed by me and considered in my medical decision making (see chart for details).    MDM Rules/Calculators/A&P                          Patient with an AKI.  Possible very very mild DKA.  Patient is given IV fluids will be admitted to medicine        This patient presents to the ED for concern of vomiting, this involves an extensive number of treatment options, and is a complaint that carries with it a high risk of complications and morbidity.  The differential diagnosis includes DKA appendicitis   Lab Tests:   I Ordered, reviewed, and interpreted labs, which included CBC and chemistries which showed elevated white count and an AKI  Medicines ordered:   I ordered medication normal saline for severe dehydration along with Zofran for nausea  Imaging Studies ordered:   I ordered imaging studies which included acute abdominal series and  I independently visualized and interpreted imaging which showed unremarkable  Additional history obtained:   Additional history obtained from old records  Previous records obtained and reviewed.  Consultations Obtained:   I consulted hospitalist and discussed lab and imaging findings  Reevaluation:  After the interventions stated above, I reevaluated the patient and found improved  Critical Interventions:  .   Final Clinical  Impression(s) / ED Diagnoses Final diagnoses:  AKI (acute kidney injury) [facility])    Rx / [place] Orders ED Discharge Orders    None       Milton Ferguson, MD 11/21/19 2009

## 2019-11-21 NOTE — H&P (Addendum)
History and Physical    [name]:[id number] DOB: May 08, 1966 DOA: 11/21/2019  PCP: Asencion Noble, MD   Patient coming from: Home  I have personally briefly reviewed patient's old medical records in [place]  Chief Complaint: Vomiting  HPI: [name] is a 54 y.o. male with medical history significant for diabetes mellitus, hypertension, HIV.  Patient presented to the ED with complaints of onset of vomiting that started yesterday afternoon.  Vomiting started within an hour of his lunch at about 3:30 PM yesterday, he reports he has had multiple episodes of vomiting in the past 24 hours.  He ate squash, cantaloupe and green beans.  Nobody else ate from the same pot.  He did not eat restaurants.  Reports abdominal soreness, which he thinks is mostly from multiple episodes of vomiting.  His last bowel movement was yesterday, was normal.  Reports abdominal bloating which he noticed yesterday morning.  No fevers no chills.  ED Course: Tachycardic to 203, pressure systolic 559R to 416L.  WBC 13.4, creatinine 2.38, serum bicarb 21, blood glucose 232, anion gap of 17.  2 L bolus given.  BMP was repeated, showed improved creatinine 1.6, but worsening acidosis bicarb 15, anion gap still 17, glucose now 402.  Abdominal x-ray without acute abnormality.  Hospitalist to admit for acute kidney injury and possibly DKA.  Review of Systems: As per HPI all other systems reviewed and negative.  Past Medical History:  Diagnosis Date  . CAD (coronary artery disease)    DES.  /  ... Later (10/2007) intervention for in-stent restenosis ;  EF 60%... catheterization... June, 2009  . Chest pain   . Diabetes mellitus type 2, uncontrolled ([place]) 09/27/2014  . Diabetes mellitus type II   . DKA (diabetic ketoacidoses) ([place]) 12/2010; 03/23/2017  . Dyslipidemia   . Ejection fraction    EF 60%, catheterization, 2009  . Gallbladder problem   . History of heart attack   . History of kidney stones   . HIV  (human immunodeficiency virus infection) ([place]) dx'd ~ 2002/2003  . HLD (hyperlipidemia)   . HTN (hypertension)   . 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  . Nausea and vomiting 03/23/2017  . 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 RPR  reverted to negative  . Sinus tachycardia    mild at rest.... October, 2010  . Syphilitic hepatitis     Past Surgical History:  Procedure Laterality Date  . CHOLECYSTECTOMY OPEN  07/2001  . COLONOSCOPY N/A 05/28/2017   Procedure: COLONOSCOPY;  Surgeon: Rogene Houston, MD;  Location: [facility] ENDO SU[facility];  Service: Endoscopy;  Laterality: N/A;  200 - pt to prep in Endo  . CORONARY ANGIOPLASTY WITH STENT PLACEMENT  03/2006; 10/2007;   . HERNIA REPAIR    . LAPAROSCOPIC INCISIONAL / UMBILICAL / VENTRAL HERNIA REPAIR  07/2001   UHR  . POLYPECTOMY  05/28/2017   Procedure: POLYPECTOMY;  Surgeon: Rogene Houston, MD;  Location: [facility] ENDO SU[facility];  Service: Endoscopy;;  colon     reports that he has never smoked. He has never used smokeless tobacco. He reports current alcohol use. He reports that he does not use drugs.  Allergies  Allergen Reactions  . Pepcid [Famotidine] Other (See Comments)    Contraindicated with ODEFSEY  . Prilosec [Omeprazole] Other (See Comments)    Contraindicated with RPV (lowers level of this ARV in ODEFSEY    Family  History  Problem Relation Age of Onset  . Heart attack Father        7 heart attacks  . CAD Father   . Alcoholism Father   . Hyperlipidemia Mother   . Hypertension Mother   . Anemia Other   . Diabetes Other   . CAD Sister   . Diabetes Sister     Prior to Admission medications   Medication Sig Start Date End Date Taking? Authorizing Provider  aspirin 81 MG tablet Take 1 tablet (81 mg total) by mouth every evening. 05/29/17   Rehman, Mechele Dawley, MD  chlorthalidone (HYGROTON) 25 MG tablet Take 1 tablet (25 mg total) by  mouth daily. 07/19/18   Lyda Jester M, PA-C  Continuous Blood Gluc Sensor (FREESTYLE LIBRE 14 DAY SENSOR) MISC  02/01/19   [provider]  empagliflozin (JARDIANCE) 25 MG TABS tablet Take 25 mg by mouth daily. 06/27/19   Abby Potash, PA-C  emtricitabine-rilpivir-tenofovir AF (ODEFSEY) 200-25-25 MG TABS tablet Take 1 tablet by mouth daily with breakfast. 05/31/19   Tommy Medal, Lavell Islam, MD  ezetimibe (ZETIA) 10 MG tablet Take 1 tablet (10 mg total) by mouth daily. 07/19/18 10/17/18  Lyda Jester M, PA-C  HUMALOG KWIKPEN 100 UNIT/ML KwikPen  12/13/18   [provider]  insulin glargine (LANTUS SOLOSTAR) 100 UNIT/ML injection Inject 45 Units into the skin at bedtime.     [provider]  insulin lispro (HUMALOG) 100 UNIT/ML injection Inject 10-16 Units into the skin 3 (three) times daily before meals.    [provider]  Insulin Pen Needle (BD PEN NEEDLE NANO 2ND GEN) 32G X 4 MM MISC 1 Package by Does not apply route 2 (two) times daily. 12/12/18   Abby Potash, PA-C  Insulin Pen Needle (BD PEN NEEDLE NANO U/F) 32G X 4 MM MISC 1 Device by Does not apply route daily. 12/26/18   Abby Potash, PA-C  LANTUS SOLOSTAR 100 UNIT/ML Solostar Pen  02/14/19   [provider]  liraglutide (VICTOZA) 18 MG/3ML SOPN Inject 0.3 mLs (1.8 mg total) into the skin every morning. 06/05/19   Abby Potash, PA-C  lisinopril (PRINIVIL,ZESTRIL) 10 MG tablet Take 1 tablet (10 mg total) by mouth daily. 07/19/18   Lyda Jester M, PA-C  metformin (FORTAMET) 1000 MG (OSM) 24 hr tablet Take 1 tablet (1,000 mg total) by mouth 2 (two) times daily with a meal. 06/27/19   Abby Potash, PA-C  metoprolol succinate (TOPROL-XL) 100 MG 24 hr tablet  10/18/18   [provider]  rosuvastatin (CRESTOR) 40 MG tablet Take 1 tablet (40 mg total) by mouth daily. 07/19/18   Lyda Jester M, PA-C  Vitamin D, Ergocalciferol, (DRISDOL) 1.25 MG (50000 UNIT) CAPS capsule Take 1  capsule (50,000 Units total) by mouth every 3 (three) days. 09/04/19   Abby Potash, PA-C    Physical Exam: Vitals:   11/21/19 1523  BP: 135/87  Pulse: (!) 123  Resp: 18  Temp: 98.7 F (37.1 C)  TempSrc: Oral  SpO2: 96%  Weight: 86.2 kg  Height: 5\' 6"  (1.676 m)    Constitutional: NAD, calm, comfortable Vitals:   11/21/19 1523  BP: 135/87  Pulse: (!) 123  Resp: 18  Temp: 98.7 F (37.1 C)  TempSrc: Oral  SpO2: 96%  Weight: 86.2 kg  Height: 5\' 6"  (1.676 m)   Eyes: PERRL, lids and conjunctivae normal ENMT: Mucous membranes are dry. Posterior pharynx clear of any exudate or lesions Neck: normal, supple, no masses, no thyromegaly  Respiratory: clear to auscultation bilaterally, no wheezing, no crackles. Normal respiratory effort. No accessory muscle use.  Cardiovascular: Regular rate and rhythm, no murmurs / rubs / gallops. No extremity edema. 2+ pedal pulses.  Abdomen: Appears bloated,, no tenderness, no masses palpated. No hepatosplenomegaly. Bowel sounds positive.  Musculoskeletal: no clubbing / cyanosis. No joint deformity upper and lower extremities. Good ROM, no contractures. Normal muscle tone.  Skin: no rashes, lesions, ulcers. No induration Neurologic: No apparent cranial nerve abnormality, moving all extremities spontaneously. Psychiatric: Normal judgment and insight. Alert and oriented x 3. Normal mood.   Labs on Admission: I have personally reviewed following labs and imaging studies  CBC: Recent Labs  Lab 11/21/19 1607  WBC 13.4*  NEUTROABS 10.3*  HGB 15.8  HCT 49.5  MCV 87.5  PLT 277   Basic Metabolic Panel: Recent Labs  Lab 11/21/19 1607  NA 140  K 4.3  CL 102  CO2 21*  GLUCOSE 232*  BUN 28*  CREATININE 2.38*  CALCIUM 9.6   Liver Function Tests: Recent Labs  Lab 11/21/19 1607  AST 19  ALT 35  ALKPHOS 67  BILITOT 1.0  PROT 8.1  ALBUMIN 4.4   CBG: Recent Labs  Lab 11/21/19 1543  GLUCAP 220*    Radiological Exams on  Admission: DG ABD ACUTE 2+V W 1V CHEST  Result Date: 11/21/2019 CLINICAL DATA:  Nausea and vomiting.  Dizziness. EXAM: DG ABDOMEN ACUTE W/ 1V CHEST COMPARISON:  Chest radiograph March 23, 2017; CT abdomen and pelvis January 04, 2011. FINDINGS: PA chest: Lungs are clear. Heart size and pulmonary vascularity are normal. No adenopathy. Supine and upright abdomen: There is moderate stool in the colon. There is no bowel dilatation or air-fluid level to suggest bowel obstruction. No free air. Surgical clips are noted in the right upper quadrant region. IMPRESSION: No bowel obstruction or free air. Moderate stool in colon. Surgical clips gallbladder fossa region. No edema or airspace opacity. Electronically Signed   By: Lowella Grip III M.D.   On: 11/21/2019 18:55    EKG: None.   Assessment/Plan Principal Problem:   AKI (acute kidney injury) ([place]) Active Problems:   Coronary artery disease due to lipid rich plaque   Essential hypertension, benign   Human immunodeficiency virus (HIV) disease (H[facility])   Type 2 diabetes mellitus without complication, with long-term current use of insulin ([facility])   Acute kidney injury-creatinine 2.38 >> 1.6 after 2 L bolus.  Baseline 0.7-0.8.  Likely prerenal from vomiting, poor p.o. intake in the setting of chlorthalidone and lisinopril use. - Hydrate -Trend creatinine -Hold chlorthalidone  DKA-blood glucose now 402, anion gap 17, serum bicarb worsening from 21-15 after hydration and with improvement in renal function.  Recent hemoglobin A1c 09/2019 8. -Obtain venous blood gas -Obtain beta hydroxybutyric acid -Insulin drip, DKA protocol -2 L bolus given, continue N/s 125cc/hr  -BMP every 4 hourly check electrolytes -Hold Metformin, Victoza, Jardiance, Lantus  Vomiting-likely gastroenteritis versus symptoms of early DKA as etiology.  Abdominal exam is benign, no diarrhea afebrile.  Mild leukocytosis of 13.4 likely stress reaction.  HIV controlled. -At this time  I have deferred imaging, persistent consider -IV fluids -Bowel rest with clear liquid diet, noncaloric fluids -Zofran as needed  Coronary artery disease-no chest pain, DES 2009. -Resume aspirin, statins  Hypertension-stable. -Hold chlorthalidone and lisinopril -Resume home metoprolol  HIV- 02/2019-CD4 count 880, viral load undetectable, < 20. -Resume Odefsey  DVT prophylaxis: Heparin Code Status: Full code Family Communication: None at bedside Disposition Plan:  1 to 2 days, improvement in renal function, resolution of DKA Consults called: None Admission status: inpt, stepdown I certify that at the point of admission it is my clinical judgment that the patient will require inpatient hospital care spanning beyond 2 midnights from the point of admission due to high intensity of service, high risk for further deterioration and high frequency of surveillance required. The following factors support the patient status of inpatient: Requiring insulin drip as an stepdown level of care, also with vomiting and acute kidney injury, requiring IV medications and assess.   Bethena Roys MD [facility]  11/21/2019, 10:24 PM

## 2019-11-22 ENCOUNTER — Other Ambulatory Visit: Payer: Self-pay

## 2019-11-22 DIAGNOSIS — Z21 Asymptomatic human immunodeficiency virus [HIV] infection status: Secondary | ICD-10-CM | POA: Diagnosis not present

## 2019-11-22 DIAGNOSIS — N179 Acute kidney failure, unspecified: Secondary | ICD-10-CM

## 2019-11-22 DIAGNOSIS — E785 Hyperlipidemia, unspecified: Secondary | ICD-10-CM | POA: Diagnosis not present

## 2019-11-22 DIAGNOSIS — I2583 Coronary atherosclerosis due to lipid rich plaque: Secondary | ICD-10-CM | POA: Diagnosis not present

## 2019-11-22 DIAGNOSIS — Z794 Long term (current) use of insulin: Secondary | ICD-10-CM | POA: Diagnosis not present

## 2019-11-22 DIAGNOSIS — I252 Old myocardial infarction: Secondary | ICD-10-CM | POA: Diagnosis not present

## 2019-11-22 DIAGNOSIS — I10 Essential (primary) hypertension: Secondary | ICD-10-CM | POA: Diagnosis not present

## 2019-11-22 DIAGNOSIS — E1110 Type 2 diabetes mellitus with ketoacidosis without coma: Secondary | ICD-10-CM | POA: Diagnosis not present

## 2019-11-22 DIAGNOSIS — Z20822 Contact with and (suspected) exposure to covid-19: Secondary | ICD-10-CM | POA: Diagnosis not present

## 2019-11-22 LAB — CBC
HCT: 43.1 % (ref 39.0–52.0)
Hemoglobin: 13.7 g/dL (ref 13.0–17.0)
MCH: 28.4 pg (ref 26.0–34.0)
MCHC: 31.8 g/dL (ref 30.0–36.0)
MCV: 89.4 fL (ref 80.0–100.0)
Platelets: 289 10*3/uL (ref 150–400)
RBC: 4.82 MIL/uL (ref 4.22–5.81)
RDW: 13.4 % (ref 11.5–15.5)
WBC: 10.5 10*3/uL (ref 4.0–10.5)
nRBC: 0 % (ref 0.0–0.2)

## 2019-11-22 LAB — GLUCOSE, CAPILLARY
Glucose-Capillary: 165 mg/dL — ABNORMAL HIGH (ref 70–99)
Glucose-Capillary: 180 mg/dL — ABNORMAL HIGH (ref 70–99)
Glucose-Capillary: 182 mg/dL — ABNORMAL HIGH (ref 70–99)
Glucose-Capillary: 189 mg/dL — ABNORMAL HIGH (ref 70–99)
Glucose-Capillary: 219 mg/dL — ABNORMAL HIGH (ref 70–99)
Glucose-Capillary: 209 mg/dL — ABNORMAL HIGH (ref 70–99)

## 2019-11-22 LAB — BASIC METABOLIC PANEL
Anion gap: 13 (ref 5–15)
Anion gap: 12 (ref 5–15)
Anion gap: 11 (ref 5–15)
Anion gap: 9 (ref 5–15)
BUN: 30 mg/dL — ABNORMAL HIGH (ref 6–20)
BUN: 31 mg/dL — ABNORMAL HIGH (ref 6–20)
BUN: 27 mg/dL — ABNORMAL HIGH (ref 6–20)
BUN: 27 mg/dL — ABNORMAL HIGH (ref 6–20)
CO2: 17 mmol/L — ABNORMAL LOW (ref 22–32)
CO2: 19 mmol/L — ABNORMAL LOW (ref 22–32)
CO2: 20 mmol/L — ABNORMAL LOW (ref 22–32)
CO2: 21 mmol/L — ABNORMAL LOW (ref 22–32)
Calcium: 8.6 mg/dL — ABNORMAL LOW (ref 8.9–10.3)
Calcium: 8.6 mg/dL — ABNORMAL LOW (ref 8.9–10.3)
Calcium: 8.5 mg/dL — ABNORMAL LOW (ref 8.9–10.3)
Calcium: 8.4 mg/dL — ABNORMAL LOW (ref 8.9–10.3)
Chloride: 108 mmol/L (ref 98–111)
Chloride: 108 mmol/L (ref 98–111)
Chloride: 106 mmol/L (ref 98–111)
Chloride: 107 mmol/L (ref 98–111)
Creatinine, Ser: 1.49 mg/dL — ABNORMAL HIGH (ref 0.61–1.24)
Creatinine, Ser: 1.24 mg/dL (ref 0.61–1.24)
Creatinine, Ser: 1.04 mg/dL (ref 0.61–1.24)
Creatinine, Ser: 0.96 mg/dL (ref 0.61–1.24)
GFR calc Af Amer: >60 mL/min (ref >60)
GFR calc Af Amer: >60 mL/min (ref >60)
GFR calc Af Amer: >60 mL/min (ref >60)
GFR calc Af Amer: >60 mL/min (ref >60)
GFR calc non Af Amer: 52 mL/min — ABNORMAL LOW (ref >60)
GFR calc non Af Amer: >60 mL/min (ref >60)
GFR calc non Af Amer: >60 mL/min (ref >60)
GFR calc non Af Amer: >60 mL/min (ref >60)
Glucose, Bld: 258 mg/dL — ABNORMAL HIGH (ref 70–99)
Glucose, Bld: 152 mg/dL — ABNORMAL HIGH (ref 70–99)
Glucose, Bld: 210 mg/dL — ABNORMAL HIGH (ref 70–99)
Glucose, Bld: 209 mg/dL — ABNORMAL HIGH (ref 70–99)
Potassium: 3.9 mmol/L (ref 3.5–5.1)
Potassium: 3.9 mmol/L (ref 3.5–5.1)
Potassium: 3.6 mmol/L (ref 3.5–5.1)
Potassium: 3.6 mmol/L (ref 3.5–5.1)
Sodium: 138 mmol/L (ref 135–145)
Sodium: 139 mmol/L (ref 135–145)
Sodium: 137 mmol/L (ref 135–145)
Sodium: 137 mmol/L (ref 135–145)

## 2019-11-22 LAB — CBG MONITORING, ED
Glucose-Capillary: 140 mg/dL — ABNORMAL HIGH (ref 70–99)
Glucose-Capillary: 142 mg/dL — ABNORMAL HIGH (ref 70–99)
Glucose-Capillary: 145 mg/dL — ABNORMAL HIGH (ref 70–99)
Glucose-Capillary: 147 mg/dL — ABNORMAL HIGH (ref 70–99)
Glucose-Capillary: 148 mg/dL — ABNORMAL HIGH (ref 70–99)
Glucose-Capillary: 194 mg/dL — ABNORMAL HIGH (ref 70–99)
Glucose-Capillary: 277 mg/dL — ABNORMAL HIGH (ref 70–99)
Glucose-Capillary: 390 mg/dL — ABNORMAL HIGH (ref 70–99)

## 2019-11-22 LAB — BETA-HYDROXYBUTYRIC ACID
Beta-Hydroxybutyric Acid: >8 mmol/L — ABNORMAL HIGH (ref 0.05–0.27)
Beta-Hydroxybutyric Acid: 2.27 mmol/L — ABNORMAL HIGH (ref 0.05–0.27)
Beta-Hydroxybutyric Acid: 0.37 mmol/L — ABNORMAL HIGH (ref 0.05–0.27)

## 2019-11-22 MED ORDER — BISACODYL 10 MG RE SUPP: 10.0000 mg | Freq: Once | RECTAL | Status: DC

## 2019-11-22 MED ORDER — CHLORTHALIDONE 25 MG PO TABS: 12.5000 mg | ORAL_TABLET | Freq: Every day | ORAL | 3 refills | Status: DC

## 2019-11-22 MED ORDER — CHLORHEXIDINE GLUCONATE CLOTH 2 % EX PADS
6.0000 | MEDICATED_PAD | Freq: Every day | CUTANEOUS | Status: DC
  Administered 2019-11-22: 6 via TOPICAL

## 2019-11-22 MED ORDER — ASPIRIN 81 MG PO TABS: 81.0000 mg | ORAL_TABLET | Freq: Every day | ORAL | 3 refills | Status: DC

## 2019-11-22 MED ORDER — INSULIN ASPART 100 UNIT/ML ~~LOC~~ SOLN
6.0000 [IU] | Freq: Three times a day (TID) | SUBCUTANEOUS | Status: DC
  Administered 2019-11-22: 6 [IU] via SUBCUTANEOUS

## 2019-11-22 MED ORDER — INSULIN ASPART 100 UNIT/ML ~~LOC~~ SOLN
10.0000 [IU] | Freq: Once | SUBCUTANEOUS | Status: AC
  Administered 2019-11-22: 10 [IU] via SUBCUTANEOUS

## 2019-11-22 MED ORDER — POLYETHYLENE GLYCOL 3350 17 G PO PACK: 17.0000 g | PACK | Freq: Every day | ORAL | 0 refills | Status: DC

## 2019-11-22 MED ORDER — INSULIN GLARGINE 100 UNIT/ML ~~LOC~~ SOLN
50.0000 [IU] | Freq: Every day | SUBCUTANEOUS | Status: DC
  Filled 2019-11-22 (×2): qty 0.5

## 2019-11-22 MED ORDER — INSULIN GLARGINE 100 UNIT/ML ~~LOC~~ SOLN
40.0000 [IU] | Freq: Every day | SUBCUTANEOUS | Status: DC
  Administered 2019-11-22: 40 [IU] via SUBCUTANEOUS
  Filled 2019-11-22 (×2): qty 0.4

## 2019-11-22 MED ORDER — POLYETHYLENE GLYCOL 3350 17 G PO PACK
17.0000 g | PACK | Freq: Two times a day (BID) | ORAL | Status: DC
  Filled 2019-11-22: qty 1

## 2019-11-22 MED ORDER — INSULIN ASPART 100 UNIT/ML ~~LOC~~ SOLN
0.0000 [IU] | Freq: Three times a day (TID) | SUBCUTANEOUS | Status: DC
  Administered 2019-11-22: 5 [IU] via SUBCUTANEOUS

## 2019-11-22 MED FILL — POLYETHYLENE GLYCOL 3350 PO: 17 | 14 days supply | Qty: 238 | Fill #0

## 2019-11-22 MED FILL — ASPIRIN 81 MG TBEC: 81 | 30 days supply | Qty: 30 | Fill #0

## 2019-11-22 MED FILL — CHLORTHALIDONE 25 MG TABS: 25 | 90 days supply | Qty: 45 | Fill #0

## 2019-11-22 NOTE — Progress Notes (Signed)
Discharge instructions provided. Patient verbalized understanding and had no further questions.

## 2019-11-22 NOTE — Progress Notes (Signed)
Inpatient Diabetes Program Recommendations  AACE/ADA: New Consensus Statement on Inpatient Glycemic Control   Target Ranges:  Prepandial:   less than 140 mg/dL      Peak postprandial:   less than 180 mg/dL (1-2 hours)      Critically ill patients:  140 - 180 mg/dL   Results for AMADI, FRADY (MRN [id number]) as of 11/22/2019 10:43  Ref. Range 11/22/2019 00:13 11/22/2019 01:13 11/22/2019 02:12 11/22/2019 03:14 11/22/2019 04:17 11/22/2019 05:39 11/22/2019 06:25 11/22/2019 07:32 11/22/2019 10:08  Glucose-Capillary Latest Ref Range: 70 - 99 mg/dL 390 (H) 277 (H) 194 (H) 140 (H) 142 (H) 148 (H) 145 (H) 147 (H) 180 (H)  Results for GIBRAN, VESELKA (MRN [id number]) as of 11/22/2019 10:43  Ref. Range 11/21/2019 22:19 11/22/2019 05:41  Beta-Hydroxybutyric Acid Latest Ref Range: 0.05 - 0.27 mmol/L >8.00 (H) 2.27 (H)  Results for OSIE, AMPARO (MRN [id number]) as of 11/22/2019 10:43  Ref. Range 02/13/2019 10:45 06/27/2019 12:54 09/26/2019 11:32  Hemoglobin A1C Latest Ref Range: 4.8 - 5.6 % 9.1 (H) 9.6 (H) 8.0 (H)   Review of Glycemic Control  Diabetes history: DM2 Outpatient Diabetes medications: Lantus 45 units QHS, Humalog 11-15 units TID with meals, Jardiance 25 mg daily, Victoza 1.8 mg QAM, Metformin 1500 mg BID Current orders for Inpatient glycemic control: IV insulin  Inpatient Diabetes Program Recommendations:    Insulin: IV insulin should be continued until acidosis is resolved (CO >20, AG <10-12, and Beta-hydroxybutyric acid less than 0.5) as determined by MD. Once acidosis is resolved and MD is ready to transition from IV to SQ insulin, please consider ordering Lantus 35 units Q24H, CBGs Q4H, Novolog 0-15 units Q4H, and Novolog 6 units TID with meals for meal coverage.  NOTE: Per chart, patient has DM2 and sees PCP (Dr. Willey Blade) for DM management. Patient has seen Dr. Dorris Fetch (Endocrinologist) in the past (last office note in chart 02/08/2018).  Patient was started on IV insulin for DKA and per labs,  beta-hydroxybutyric acid was 2.27 today at 5:41 am and labs at 9:48 am with CO2 20 and AG 11.  Last A1C in the chart was 8% on 09/26/19 indicating an average glucose of 183 mg/dl.    Thanks, Barnie Alderman, RN, MSN, CDE Diabetes Coordinator Inpatient Diabetes Program [phone] (Team Pager from 8am to 5pm)

## 2019-11-22 NOTE — Discharge Summary (Signed)
[name], is a 54 y.o. male  DOB [birth year]  MRN [id number].  Admission date:  11/21/2019  Admitting Physician  Bethena Roys, MD  Discharge Date:  11/22/2019   Primary MD  Asencion Noble, MD  Recommendations for primary care physician for things to follow:   1)Please Decrease chlorthalidone to 12.5 mg daily 2)Drink plenty lots of low caloric fluids 3)Follow-up with PCP in 1 to 2 weeks for recheck and reevaluation 4)Avoid constipation  Admission Diagnosis  DKA (diabetic ketoacidoses) ([place]) [E11.10] AKI (acute kidney injury) ([place]) [N17.9]   Discharge Diagnosis  DKA (diabetic ketoacidoses) ([place]) [E11.10] AKI (acute kidney injury) ([place]) [N17.9]    Principal Problem:   AKI (acute kidney injury) ([place]) Active Problems:   Coronary artery disease due to lipid rich plaque   DKA (diabetic ketoacidoses) ([place])   Essential hypertension, benign   Human immunodeficiency virus (HIV) disease ([place])   Type 2 diabetes mellitus without complication, with long-term current use of insulin ([place])      Past Medical History:  Diagnosis Date  . CAD (coronary artery disease)    DES.  /  ... Later (10/2007) intervention for in-stent restenosis ;  EF 60%... catheterization... June, 2009  . Chest pain   . Diabetes mellitus type 2, uncontrolled ([place]) 09/27/2014  . Diabetes mellitus type II   . DKA (diabetic ketoacidoses) ([place]) 12/2010; 03/23/2017  . Dyslipidemia   . Ejection fraction    EF 60%, catheterization, 2009  . Gallbladder problem   . History of heart attack   . History of kidney stones   . HIV (human immunodeficiency virus infection) ([place]) dx'd ~ 2002/2003  . HLD (hyperlipidemia)   . HTN (hypertension)   . 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  . Nausea and vomiting 03/23/2017  . 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 RPR  reverted to negative  . Sinus tachycardia    mild at rest.... October, 2010  . Syphilitic hepatitis     Past Surgical History:  Procedure Laterality Date  . CHOLECYSTECTOMY OPEN  07/2001  . COLONOSCOPY N/A 05/28/2017   Procedure: COLONOSCOPY;  Surgeon: Rogene Houston, MD;  Location: [facility];  Service: Endoscopy;  Laterality: N/A;  200 - pt to prep in Endo  . CORONARY ANGIOPLASTY WITH STENT PLACEMENT  03/2006; 10/2007;   . HERNIA REPAIR    . LAPAROSCOPIC INCISIONAL / UMBILICAL / VENTRAL HERNIA REPAIR  07/2001   UHR  . POLYPECTOMY  05/28/2017   Procedure: POLYPECTOMY;  Surgeon: Rogene Houston, MD;  Location: [facility];  Service: Endoscopy;;  colon       HPI  from the history and physical done on the day of admission:    Chief Complaint: Vomiting  HPI: [name] is a 54 y.o. male with medical history significant for diabetes mellitus, hypertension, HIV.  Patient presented to the ED with complaints of onset of vomiting that started yesterday afternoon.  Vomiting  started within an hour of his lunch at about 3:30 PM yesterday, he reports he has had multiple episodes of vomiting in the past 24 hours.  He ate squash, cantaloupe and green beans.  Nobody else ate from the same pot.  He did not eat restaurants.  Reports abdominal soreness, which he thinks is mostly from multiple episodes of vomiting.  His last bowel movement was yesterday, was normal.  Reports abdominal bloating which he noticed yesterday morning.  No fevers no chills.  ED Course: Tachycardic to 003, pressure systolic 491P to 915A.  WBC 13.4, creatinine 2.38, serum bicarb 21, blood glucose 232, anion gap of 17.  2 L bolus given.  BMP was repeated, showed improved creatinine 1.6, but worsening acidosis bicarb 15, anion gap still 17, glucose now 402.  Abdominal x-ray without acute abnormality.  Hospitalist to admit for acute kidney injury and possibly DKA.  Review of Systems: As per  HPI all other systems reviewed and negative.     Hospital Course:        A/p 1)DKA--- patient was admitted with elevated anion gap, metabolic acidosis--he met DKA criteria on admission treated with IV insulin via Endo tool protocol --transitioned to subcu insulin after resolution of DKA pathophysiology -Back to hydroxybutyric acid improved from over 8 on admission to 0.37 on discharge -[place] went down to 9 from 17 on admission - -Patient is tolerating oral intake well  2)Constipation and vomiting--- no further emesis, patient had BMs with laxatives,  3)AKI----acute kidney injury due to dehydration in the setting of DKA and persistent emesis -   creatinine on admission= 2.38, baseline creatinine = 0.8   , creatinine is now= 0.96 at the time of discharge --, renally adjust medications, avoid nephrotoxic agents / dehydration  / hypotension -Acute kidney injury resolved with resolution of emesis and with IV fluids and increase oral intake  4)CAD-we will, chest pain-free, history of angioplasty and stents in 2009, okay to continue aspirin , metoprolol and Crestor  5)HiV--- continue outpatient regimen, recent viral load was undetectable  6)HTN--stable, reduce chlorthalidone due to risk of dehydration -Continue metoprolol and lisinopril  Discharge Condition: Stable  Follow UP--PCP as advised   Follow-up Information    Asencion Noble, MD Follow up in 1 week(s).   Specialty: Internal Medicine Contact information: [address] [place] [address] [phone]        Tommy Medal, Lavell Islam, MD .   Specialty: Infectious Diseases Contact information: [place] [place] [address] [phone]                Diet and Activity recommendation:  As advised  Discharge Instructions    Discharge Instructions    Call MD for:  difficulty breathing, headache or visual disturbances   Complete by: As directed    Call MD for:  persistant dizziness or  light-headedness   Complete by: As directed    Call MD for:  persistant nausea and vomiting   Complete by: As directed    Call MD for:  severe uncontrolled pain   Complete by: As directed    Call MD for:  temperature >100.4   Complete by: As directed    Diet - low sodium heart healthy   Complete by: As directed    Diet Carb Modified   Complete by: As directed    Discharge instructions   Complete by: As directed    1)Please Decrease chlorthalidone to 12.5 mg daily 2)Drink plenty lots of low caloric  fluids 3)Follow-up with PCP in 1 to 2 weeks for recheck and reevaluation 4)Avoid constipation   Increase activity slowly   Complete by: As directed         Discharge Medications     Allergies as of 11/22/2019      Reactions   Pepcid [famotidine] Other (See Comments)   Contraindicated with ODEFSEY   Prilosec [omeprazole] Other (See Comments)   Contraindicated with RPV (lowers level of this ARV in ODEFSEY      Medication List    STOP taking these medications   ezetimibe 10 MG tablet Commonly known as: ZETIA     TAKE these medications   aspirin 81 MG tablet Take 1 tablet (81 mg total) by mouth daily with breakfast. What changed: when to take this   chlorthalidone 25 MG tablet Commonly known as: HYGROTON Take 0.5 tablets (12.5 mg total) by mouth daily. What changed: how much to take   HumaLOG KwikPen 100 UNIT/ML KwikPen Generic drug: insulin lispro Inject 11-15 Units into the skin 3 (three) times daily. Per sliding scale-patient monitored adding 1 unit after levels of 150   Jardiance 25 MG Tabs tablet Generic drug: empagliflozin Take 25 mg by mouth daily.   Lantus SoloStar 100 UNIT/ML injection Generic drug: insulin glargine Inject 45 Units into the skin at bedtime.   liraglutide 18 MG/3ML Sopn Commonly known as: VICTOZA Inject 0.3 mLs (1.8 mg total) into the skin every morning.   lisinopril 10 MG tablet Commonly known as: ZESTRIL Take 1 tablet (10 mg total)  by mouth daily.   metFORMIN 1000 MG tablet Commonly known as: GLUCOPHAGE Take 1,000 mg by mouth 2 (two) times daily with a meal.   metFORMIN 500 MG 24 hr tablet Commonly known as: GLUCOPHAGE-XR Take 500 mg by mouth in the morning and at bedtime.   metoprolol succinate 100 MG 24 hr tablet Commonly known as: TOPROL-XL Take 100 mg by mouth.   Odefsey 200-25-25 MG Tabs tablet Generic drug: emtricitabine-rilpivir-tenofovir AF Take 1 tablet by mouth daily with breakfast.   polyethylene glycol 17 g packet Commonly known as: MIRALAX / GLYCOLAX Take 17 g by mouth daily.   rosuvastatin 40 MG tablet Commonly known as: CRESTOR Take 1 tablet (40 mg total) by mouth daily.   Vitamin D (Ergocalciferol) 1.25 MG (50000 UNIT) Caps capsule Commonly known as: DRISDOL Take 1 capsule (50,000 Units total) by mouth every 3 (three) days. What changed:   when to take this  additional instructions       Major procedures and Radiology Reports - PLEASE review detailed and final reports for all details, in brief -   DG ABD ACUTE 2+V W 1V CHEST  Result Date: 11/21/2019 CLINICAL DATA:  Nausea and vomiting.  Dizziness. EXAM: DG ABDOMEN ACUTE W/ 1V CHEST COMPARISON:  Chest radiograph March 23, 2017; CT abdomen and pelvis January 04, 2011. FINDINGS: PA chest: Lungs are clear. Heart size and pulmonary vascularity are normal. No adenopathy. Supine and upright abdomen: There is moderate stool in the colon. There is no bowel dilatation or air-fluid level to suggest bowel obstruction. No free air. Surgical clips are noted in the right upper quadrant region. IMPRESSION: No bowel obstruction or free air. Moderate stool in colon. Surgical clips gallbladder fossa region. No edema or airspace opacity. Electronically Signed   By: Lowella Grip III M.D.   On: 11/21/2019 18:55    Micro Results    Recent Results (from the past 240 hour(s))  SARS Coronavirus 2 by RT  PCR (hospital order, performed in [facility]  hospital lab) Nasopharyngeal Nasopharyngeal Swab     Status: None   Collection Time: 11/21/19  8:57 PM   Specimen: Nasopharyngeal Swab  Result Value Ref Range Status   SARS Coronavirus 2 NEGATIVE NEGATIVE Final    Comment: (NOTE) SARS-CoV-2 target nucleic acids are NOT DETECTED.  The SARS-CoV-2 RNA is generally detectable in upper and lower respiratory specimens during the acute phase of infection. The lowest concentration of SARS-CoV-2 viral copies this assay can detect is 250 copies / mL. A negative result does not preclude SARS-CoV-2 infection and should not be used as the sole basis for treatment or other patient management decisions.  A negative result may occur with improper specimen collection / handling, submission of specimen other than nasopharyngeal swab, presence of viral mutation(s) within the areas targeted by this assay, and inadequate number of viral copies (<250 copies / mL). A negative result must be combined with clinical observations, patient history, and epidemiological information.  Fact Sheet for Patients:   StrictlyIdeas.no  Fact Sheet for Healthcare Providers: BankingDealers.co.za  This test is not yet approved or  cleared by the [place] FDA and has been authorized for detection and/or diagnosis of SARS-CoV-2 by FDA under an Emergency Use Authorization (EUA).  This EUA will remain in effect (meaning this test can be used) for the duration of the COVID-19 declaration under Section 564(b)(1) of the Act, 21 U.S.C. section 360bbb-3(b)(1), unless the authorization is terminated or revoked sooner.  Performed at [facility], [address], [place], [place] [address]        Today   Subjective    [name] today has no new complaints         No Nausea, Vomiting or Diarrhea  -Eating and drinking well, no fevers or chills   Patient has been seen and examined prior to discharge   Objective    Blood pressure 97/60, pulse 93, temperature 98.4 F (36.9 C), resp. rate (!) 25, height 5' 6"  (1.676 m), weight 86.2 kg, SpO2 96 %.   Intake/Output Summary (Last 24 hours) at 11/22/2019 1707 Last data filed at 11/22/2019 1620 Gross per 24 hour  Intake 3983.12 ml  Output 620 ml  Net 3363.12 ml    Exam Gen:- Awake Alert, no acute distress  HEENT:- .AT, No sclera icterus Neck-Supple Neck,No JVD,.  Lungs-  CTAB , good air movement bilaterally  CV- S1, S2 normal, regular Abd-  +ve B.Sounds, Abd Soft, No tenderness,    Extremity/Skin:- No  edema,   good pulses Psych-affect is appropriate, oriented x3 Neuro-no new focal deficits, no tremors    Data Review   CBC w Diff:  Lab Results  Component Value Date   WBC 10.5 11/22/2019   HGB 13.7 11/22/2019   HGB 15.7 02/13/2019   HCT 43.1 11/22/2019   HCT 47.1 02/13/2019   PLT 289 11/22/2019   PLT 298 02/13/2019   LYMPHOPCT 13 11/21/2019   MONOPCT 10 11/21/2019   EOSPCT 0 11/21/2019   BASOPCT 0 11/21/2019    CMP:  Lab Results  Component Value Date   NA 137 11/22/2019   NA 142 09/26/2019   K 3.6 11/22/2019   CL 107 11/22/2019   CO2 21 (L) 11/22/2019   BUN 27 (H) 11/22/2019   BUN 16 09/26/2019   CREATININE 0.96 11/22/2019   CREATININE 0.82 03/14/2019   PROT 8.1 11/21/2019   PROT 6.6 09/26/2019   ALBUMIN 4.4 11/21/2019   ALBUMIN 4.5 09/26/2019  BILITOT 1.0 11/21/2019   BILITOT 0.3 09/26/2019   ALKPHOS 67 11/21/2019   AST 19 11/21/2019   ALT 35 11/21/2019  .   Total Discharge time is about 33 minutes  Roxan Hockey M.D on 11/22/2019 at 5:07 PM  Go to [email or web address] -  for contact info  [facility] - Office  [phone]

## 2019-11-22 NOTE — Discharge Instructions (Signed)
1)Please Decrease chlorthalidone to 12.5 mg daily 2)Drink plenty lots of low caloric fluids 3)Follow-up with PCP in 1 to 2 weeks for recheck and reevaluation 4)Avoid constipation

## 2019-11-23 ENCOUNTER — Other Ambulatory Visit: Payer: Self-pay | Admitting: Infectious Disease

## 2019-11-23 DIAGNOSIS — B20 Human immunodeficiency virus [HIV] disease: Secondary | ICD-10-CM

## 2019-11-23 LAB — MRSA PCR SCREENING: MRSA by PCR: NEGATIVE

## 2019-11-23 MED FILL — ODEFSEY 200-25-25 MG TABS: 200-25-25 | 30 days supply | Qty: 30 | Fill #0

## 2019-11-24 ENCOUNTER — Other Ambulatory Visit: Payer: Self-pay | Admitting: *Deleted

## 2019-11-24 NOTE — Patient Outreach (Signed)
[place] [facility]) Care Management  11/24/2019  JENIEL SLAUSON Dec 31, 1965 [id number]   Transition of care telephone call  Referral received:11/22/19 Initial outreach:11/24/19 Insurance: [facility]   Initial unsuccessful telephone call to patient's preferred number in order to complete transition of care assessment; no answer, left HIPAA compliant voicemail message requesting return call.   Objective: Per the electronic medical record, Mr.Wampole  was hospitalized at [facility] from 6/29-6/30  With/for Acute Kidney injury, nausea vomiting, DKA  . Comorbidities include: Diabetes type 2, HIV,  He was discharged to home on 11/22/19 without the need for home health services or durable medical equipment per the discharge summary.   Plan: This RNCM will route unsuccessful outreach letter with [place] Management pamphlet and 24 hour Nurse Advice Line Magnet to [place] Management clinical pool to be mailed to patient's home address. This RNCM will attempt another outreach within 4 business days.   Joylene Draft, RN, BSN  [place] Management Coordinator  [phone]- Mobile [phone]- Toll Free Main Office

## 2019-11-28 DIAGNOSIS — E1010 Type 1 diabetes mellitus with ketoacidosis without coma: Secondary | ICD-10-CM | POA: Diagnosis not present

## 2019-11-28 DIAGNOSIS — R1310 Dysphagia, unspecified: Secondary | ICD-10-CM | POA: Diagnosis not present

## 2019-11-29 ENCOUNTER — Other Ambulatory Visit: Payer: Self-pay | Admitting: *Deleted

## 2019-11-29 NOTE — Patient Outreach (Signed)
[place] [facility]) Care Management  11/29/2019  [name] 1965/06/03 [id number]   Transition of care call Referral received: 11/22/19 Initial outreach attempt: 11/24/19 Insurance: [employer]    2nd unsuccessful telephone call to patient's preferred contact number in order to complete post hospital discharge transition of care assessment , no answer left HIPAA compliant message requesting return call.    Objective: Per the electronic medical record, [name]  was hospitalized at [facility] from 6/29-6/30  With/for Acute Kidney injury, nausea vomiting, DKA  . Comorbidities include: Diabetes type 2, HIV,  He was discharged to home on 11/22/19 without the need for home health services or durable medical equipment per the discharge summary.    Plan If no return call from patient will attempt 3rd outreach in the next 4 business days.    Joylene Draft, RN, BSN  [place] Management Coordinator  [phone]- Mobile [phone]- Toll Free Main Office

## 2019-11-30 ENCOUNTER — Encounter (INDEPENDENT_AMBULATORY_CARE_PROVIDER_SITE_OTHER): Payer: Self-pay

## 2019-11-30 ENCOUNTER — Other Ambulatory Visit (INDEPENDENT_AMBULATORY_CARE_PROVIDER_SITE_OTHER): Payer: Self-pay | Admitting: *Deleted

## 2019-11-30 DIAGNOSIS — R1310 Dysphagia, unspecified: Secondary | ICD-10-CM

## 2019-11-30 MED FILL — HUMALOG 100 UNITS/ML KWIKPE: 100 | 90 days supply | Qty: 90 | Fill #0

## 2019-12-04 ENCOUNTER — Other Ambulatory Visit: Payer: Self-pay | Admitting: *Deleted

## 2019-12-04 NOTE — Patient Outreach (Signed)
[place] [facility]) Care Management  12/04/2019  [name] Woods 16-May-1966 [id number]   Transition of care call Referral received: 11/22/19 Initial outreach attempt: 11/24/19 Insurance: [place] unsuccessful telephone call to patient's preferred contact number in order to complete post hospital discharge transition of care assessment; no answer, left HIPAA compliant message requesting return call.   Objective: Per the electronic medical record, Mr.Omarawas hospitalized at [facility] from 6/29-6/30With/for Acute Kidney injury, nausea vomiting, DKA. Comorbidities include: Diabetes type 2, HIV,He was discharged to home on 6/30/21without the need for home health services or durable medical equipment per the discharge summary.  Plan: If no return call from patient, will close case to [place] Management services in 10 business days after initial post hospital discharge outreach, on 11/24/19   Joylene Draft, RN, BSN  [place] Management Coordinator  [phone]- Mobile [phone]- [place] .

## 2019-12-07 ENCOUNTER — Other Ambulatory Visit: Payer: Self-pay | Admitting: *Deleted

## 2019-12-07 NOTE — Patient Outreach (Signed)
[facility]) Care Management  12/07/2019  [name] [birth year] [id number]   Transition of care /Case Closure Unsuccessful outreach    Referral received:11/22/19 Initial outreach:11/24/19 Insurance: [employer]   Unable to complete post hospital discharge transition of care assessment. No return call form patient after 3 call attempts and no response to request to contact RN Care Coordinator in unsuccessful outreach letter mailed to home on 11/24/19.  Objective: Per the electronic medical record, [name] hospitalized at [facility] from 6/29-6/30With/for Acute Kidney injury, nausea vomiting, DKA. Comorbidities include: Diabetes type 2, HIV,He was discharged to home on 6/30/21without the need for home health services or durable medical equipment per the discharge summary. Plan Case closed to [facility] [employer] as it has been 10 days since initial post discharge outreach attempt.    Joylene Draft, RN, BSN  [place] Management Coordinator  [phone]- Mobile [phone]- Toll Free Main Office

## 2019-12-20 ENCOUNTER — Encounter (INDEPENDENT_AMBULATORY_CARE_PROVIDER_SITE_OTHER): Payer: Self-pay

## 2019-12-22 ENCOUNTER — Other Ambulatory Visit: Payer: Self-pay | Admitting: Cardiology

## 2019-12-25 MED FILL — ODEFSEY 200-25-25 MG TABS: 200-25-25 | 30 days supply | Qty: 30 | Fill #1

## 2019-12-26 ENCOUNTER — Encounter (INDEPENDENT_AMBULATORY_CARE_PROVIDER_SITE_OTHER): Payer: Self-pay

## 2019-12-26 ENCOUNTER — Other Ambulatory Visit: Payer: Self-pay

## 2019-12-26 ENCOUNTER — Other Ambulatory Visit (HOSPITAL_COMMUNITY)
Admission: RE | Admit: 2019-12-26 | Discharge: 2019-12-26 | Disposition: A | Discharge status: Home / Self Care | Payer: 59 | Source: Ambulatory Visit | Attending: Internal Medicine | Admitting: Internal Medicine

## 2019-12-26 DIAGNOSIS — Z20822 Contact with and (suspected) exposure to covid-19: Secondary | ICD-10-CM | POA: Insufficient documentation

## 2019-12-26 DIAGNOSIS — Z01812 Encounter for preprocedural laboratory examination: Secondary | ICD-10-CM | POA: Diagnosis not present

## 2019-12-26 LAB — SARS CORONAVIRUS 2 (TAT 6-24 HRS): SARS Coronavirus 2: NEGATIVE

## 2019-12-28 ENCOUNTER — Other Ambulatory Visit: Payer: Self-pay

## 2019-12-28 ENCOUNTER — Ambulatory Visit (HOSPITAL_COMMUNITY)
Admission: RE | Admit: 2019-12-28 | Discharge: 2019-12-28 | Disposition: A | Discharge status: Home / Self Care | Payer: 59 | Source: Ambulatory Visit | Attending: Internal Medicine | Admitting: Internal Medicine

## 2019-12-28 ENCOUNTER — Encounter (HOSPITAL_COMMUNITY)
Admission: RE | Disposition: A | Discharge status: Home / Self Care | Payer: Self-pay | Source: Ambulatory Visit | Attending: Internal Medicine

## 2019-12-28 ENCOUNTER — Encounter (HOSPITAL_COMMUNITY): Payer: Self-pay | Admitting: Internal Medicine

## 2019-12-28 ENCOUNTER — Other Ambulatory Visit: Payer: Self-pay | Admitting: Infectious Disease

## 2019-12-28 DIAGNOSIS — I2510 Atherosclerotic heart disease of native coronary artery without angina pectoris: Secondary | ICD-10-CM | POA: Insufficient documentation

## 2019-12-28 DIAGNOSIS — I10 Essential (primary) hypertension: Secondary | ICD-10-CM | POA: Diagnosis not present

## 2019-12-28 DIAGNOSIS — Z955 Presence of coronary angioplasty implant and graft: Secondary | ICD-10-CM | POA: Diagnosis not present

## 2019-12-28 DIAGNOSIS — K222 Esophageal obstruction: Secondary | ICD-10-CM | POA: Diagnosis not present

## 2019-12-28 DIAGNOSIS — R1310 Dysphagia, unspecified: Secondary | ICD-10-CM | POA: Diagnosis not present

## 2019-12-28 DIAGNOSIS — E119 Type 2 diabetes mellitus without complications: Secondary | ICD-10-CM | POA: Insufficient documentation

## 2019-12-28 DIAGNOSIS — Z8249 Family history of ischemic heart disease and other diseases of the circulatory system: Secondary | ICD-10-CM | POA: Insufficient documentation

## 2019-12-28 DIAGNOSIS — K228 Other specified diseases of esophagus: Secondary | ICD-10-CM | POA: Insufficient documentation

## 2019-12-28 DIAGNOSIS — Z794 Long term (current) use of insulin: Secondary | ICD-10-CM | POA: Diagnosis not present

## 2019-12-28 DIAGNOSIS — Z8349 Family history of other endocrine, nutritional and metabolic diseases: Secondary | ICD-10-CM | POA: Diagnosis not present

## 2019-12-28 DIAGNOSIS — I252 Old myocardial infarction: Secondary | ICD-10-CM | POA: Insufficient documentation

## 2019-12-28 DIAGNOSIS — Z888 Allergy status to other drugs, medicaments and biological substances status: Secondary | ICD-10-CM | POA: Insufficient documentation

## 2019-12-28 DIAGNOSIS — Z7982 Long term (current) use of aspirin: Secondary | ICD-10-CM | POA: Insufficient documentation

## 2019-12-28 DIAGNOSIS — Z21 Asymptomatic human immunodeficiency virus [HIV] infection status: Secondary | ICD-10-CM | POA: Insufficient documentation

## 2019-12-28 DIAGNOSIS — K2100 Gastro-esophageal reflux disease with esophagitis, without bleeding: Secondary | ICD-10-CM | POA: Insufficient documentation

## 2019-12-28 DIAGNOSIS — Z79899 Other long term (current) drug therapy: Secondary | ICD-10-CM | POA: Diagnosis not present

## 2019-12-28 DIAGNOSIS — Z833 Family history of diabetes mellitus: Secondary | ICD-10-CM | POA: Diagnosis not present

## 2019-12-28 DIAGNOSIS — E785 Hyperlipidemia, unspecified: Secondary | ICD-10-CM | POA: Insufficient documentation

## 2019-12-28 DIAGNOSIS — R1314 Dysphagia, pharyngoesophageal phase: Secondary | ICD-10-CM | POA: Diagnosis not present

## 2019-12-28 HISTORY — PX: ESOPHAGOGASTRODUODENOSCOPY: SHX5428

## 2019-12-28 HISTORY — PX: BIOPSY: SHX5522

## 2019-12-28 HISTORY — PX: ESOPHAGEAL DILATION: SHX303

## 2019-12-28 LAB — GLUCOSE, CAPILLARY: Glucose-Capillary: 85 mg/dL (ref 70–99)

## 2019-12-28 SURGERY — EGD (ESOPHAGOGASTRODUODENOSCOPY)
Anesthesia: Moderate Sedation

## 2019-12-28 MED ORDER — BICTEGRAVIR-EMTRICITAB-TENOFOV 50-200-25 MG PO TABS: 1.0000 | ORAL_TABLET | Freq: Every day | ORAL | 5 refills | Status: DC

## 2019-12-28 MED ORDER — LIDOCAINE VISCOUS HCL 2 % MT SOLN
OROMUCOSAL | Status: DC | PRN
  Administered 2019-12-28: 1 via OROMUCOSAL

## 2019-12-28 MED ORDER — LIDOCAINE VISCOUS HCL 2 % MT SOLN
OROMUCOSAL | Status: AC
  Filled 2019-12-28: qty 15

## 2019-12-28 MED ORDER — STERILE WATER FOR IRRIGATION IR SOLN
Status: DC | PRN
  Administered 2019-12-28: 1.5 mL

## 2019-12-28 MED ORDER — SODIUM CHLORIDE 0.9 % IV SOLN: INTRAVENOUS | Status: DC

## 2019-12-28 MED ORDER — MEPERIDINE HCL 50 MG/ML IJ SOLN
INTRAMUSCULAR | Status: DC | PRN
  Administered 2019-12-28 (×2): 25 mg via INTRAVENOUS

## 2019-12-28 MED ORDER — MIDAZOLAM HCL 5 MG/5ML IJ SOLN
INTRAMUSCULAR | Status: DC | PRN
  Administered 2019-12-28: 1 mg via INTRAVENOUS
  Administered 2019-12-28 (×2): 2 mg via INTRAVENOUS

## 2019-12-28 MED ORDER — PANTOPRAZOLE SODIUM 40 MG PO TBEC: 40.0000 mg | DELAYED_RELEASE_TABLET | Freq: Every day | ORAL | 1 refills | Status: DC

## 2019-12-28 MED ORDER — MEPERIDINE HCL 50 MG/ML IJ SOLN
INTRAMUSCULAR | Status: AC
  Filled 2019-12-28: qty 1

## 2019-12-28 MED ORDER — MIDAZOLAM HCL 5 MG/5ML IJ SOLN
INTRAMUSCULAR | Status: AC
  Filled 2019-12-28: qty 10

## 2019-12-28 NOTE — Progress Notes (Signed)
Patient with reflux esophagitis requiring PPI which means he can not be on RPV any longer  Will switch him to [facility] and have labs checked in 1-2 months

## 2019-12-28 NOTE — H&P (Signed)
[name] is an 54 y.o. male.   Chief Complaint: Patient is here for esophagogastroduodenoscopy with esophageal dilation. HPI: Patient is 54 year old Caucasian multiple medical problems including coronary artery disease diabetes mellitus HIV (HIV 1 RNA quant undetectable) who presents with 3 to 4 months history of dysphagia to solids.  He points to upper and mid sternal area site of bolus obstruction.  He has had multiple episodes of food impaction relieved with regurgitation and spontaneously.  He denies heartburn.  He denies anorexia or weight loss abdominal pain or melena.  Past Medical History:  Diagnosis Date  . CAD (coronary artery disease)    DES.  /  ... Later (10/2007) intervention for in-stent restenosis ;  EF 60%... catheterization... June, 2009  . Chest pain   . Diabetes mellitus type 2, uncontrolled ([place]) 09/27/2014  . Diabetes mellitus type II   . DKA (diabetic ketoacidoses) ([place]) 12/2010; 03/23/2017  . Dyslipidemia   . Ejection fraction    EF 60%, catheterization, 2009  . Gallbladder problem   . History of heart attack   . History of kidney stones   . HIV (human immunodeficiency virus infection) ([place]) dx'd ~ 2002/2003  . HLD (hyperlipidemia)   . HTN (hypertension)   . 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  . Nausea and vomiting 03/23/2017  . 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 RPR  reverted to negative  . Sinus tachycardia    mild at rest.... October, 2010  . Syphilitic hepatitis     Past Surgical History:  Procedure Laterality Date  . CHOLECYSTECTOMY OPEN  07/2001  . COLONOSCOPY N/A 05/28/2017   Procedure: COLONOSCOPY;  Surgeon: Rogene Houston, MD;  Location: [facility];  Service: Endoscopy;  Laterality: N/A;  200 - pt to prep in Endo  . CORONARY ANGIOPLASTY WITH STENT PLACEMENT  03/2006; 10/2007;   . HERNIA REPAIR    . LAPAROSCOPIC INCISIONAL /  UMBILICAL / VENTRAL HERNIA REPAIR  07/2001   UHR  . POLYPECTOMY  05/28/2017   Procedure: POLYPECTOMY;  Surgeon: Rogene Houston, MD;  Location: [facility];  Service: Endoscopy;;  colon    Family History  Problem Relation Age of Onset  . Heart attack Father        7 heart attacks  . CAD Father   . Alcoholism Father   . Hyperlipidemia Mother   . Hypertension Mother   . Anemia Other   . Diabetes Other   . CAD Sister   . Diabetes Sister    Social History:  reports that he has never smoked. He has never used smokeless tobacco. He reports current alcohol use. He reports that he does not use drugs.  Allergies:  Allergies  Allergen Reactions  . Pepcid [Famotidine] Other (See Comments)    Contraindicated with ODEFSEY  . Prilosec [Omeprazole] Other (See Comments)    Contraindicated with RPV (lowers level of this ARV in ODEFSEY    Medications Prior to Admission  Medication Sig Dispense Refill  . aspirin 81 MG tablet Take 1 tablet (81 mg total) by mouth daily with breakfast. 30 tablet 3  . chlorthalidone (HYGROTON) 25 MG tablet Take 0.5 tablets (12.5 mg total) by mouth daily. 45 tablet 3  . HUMALOG KWIKPEN 100 UNIT/ML KwikPen Inject 11-15 Units into the skin 3 (three) times daily. Per sliding scale-patient monitored adding 1 unit after levels of 150    . insulin glargine (LANTUS SOLOSTAR)  100 UNIT/ML injection Inject 45 Units into the skin at bedtime.     . liraglutide (VICTOZA) 18 MG/3ML SOPN Inject 0.3 mLs (1.8 mg total) into the skin every morning. 3 pen 0  . lisinopril (PRINIVIL,ZESTRIL) 10 MG tablet Take 1 tablet (10 mg total) by mouth daily. 90 tablet 3  . metFORMIN (GLUCOPHAGE-XR) 500 MG 24 hr tablet Take 500 mg by mouth in the morning and at bedtime.    . metoprolol succinate (TOPROL-XL) 100 MG 24 hr tablet Take 100 mg by mouth daily.     . Multiple Vitamins-Minerals (MULTIVITAMIN WITH MINERALS) tablet Take 1 tablet by mouth daily.    . ODEFSEY 200-25-25 MG TABS tablet TAKE 1  TABLET BY MOUTH DAILY WITH BREAKFAST. 30 tablet 5  . rosuvastatin (CRESTOR) 40 MG tablet Take 1 tablet (40 mg total) by mouth daily. 90 tablet 3  . Vitamin D, Ergocalciferol, (DRISDOL) 1.25 MG (50000 UNIT) CAPS capsule Take 1 capsule (50,000 Units total) by mouth every 3 (three) days. (Patient taking differently: Take 50,000 Units by mouth 2 (two) times a week. Sundays and Wednesdays) 10 capsule 0  . empagliflozin (JARDIANCE) 25 MG TABS tablet Take 25 mg by mouth daily. (Patient not taking: Reported on 12/15/2019) 90 tablet 0  . polyethylene glycol (MIRALAX / GLYCOLAX) 17 g packet Take 17 g by mouth daily. (Patient not taking: Reported on 12/15/2019) 14 each 0    Results for orders placed or performed during the hospital encounter of 12/28/19 (from the past 48 hour(s))  Glucose, capillary     Status: None   Collection Time: 12/28/19 10:22 AM  Result Value Ref Range   Glucose-Capillary 85 70 - 99 mg/dL    Comment: Glucose reference range applies only to samples taken after fasting for at least 8 hours.   No results found.  Review of Systems  Blood pressure (!) 150/79, temperature 98.8 F (37.1 C), temperature source Oral, resp. rate 19, height 5\' 6"  (1.676 m), SpO2 98 %. Physical Exam HENT:     Mouth/Throat:     Mouth: Mucous membranes are moist.     Pharynx: Oropharynx is clear.  Eyes:     General: No scleral icterus.    Conjunctiva/sclera: Conjunctivae normal.  Cardiovascular:     Rate and Rhythm: Normal rate and regular rhythm.     Heart sounds: Normal heart sounds. No murmur heard.   Pulmonary:     Effort: Pulmonary effort is normal.     Breath sounds: Normal breath sounds.  Abdominal:     General: There is no distension.     Palpations: Abdomen is soft. There is no mass.     Tenderness: There is no abdominal tenderness.  Musculoskeletal:        General: No swelling.  Lymphadenopathy:     Cervical: No cervical adenopathy.  Skin:    General: Skin is warm and dry.   Neurological:     Mental Status: He is alert.      Assessment/Plan Esophageal dysphagia. History of HIV on maintenance therapy with undetectable HIV-1 RNA levels. Esophagogastroduodenoscopy with possible esophageal dilation.  Hildred Laser, MD 12/28/2019, 10:49 AM

## 2019-12-28 NOTE — Discharge Instructions (Signed)
Resume aspirin on December 31, 2019. Discontinue Odefsey and begin Biktarvy 1 tablet by mouth daily. Pantoprazole 40 mg by mouth 30 minutes before breakfast daily. Resume other medications as before. No driving for 24 hours. Physician will call with biopsy results.  Please arrange office visit with Dr. Tommy Medal in 1 month.      Upper Endoscopy, Adult, Care After This sheet gives you information about how to care for yourself after your procedure. Your health care provider may also give you more specific instructions. If you have problems or questions, contact your health care provider. What can I expect after the procedure? After the procedure, it is common to have:  A sore throat.  Mild stomach pain or discomfort.  Bloating.  Nausea. Follow these instructions at home:   Follow instructions from your health care provider about what to eat or drink after your procedure.  Return to your normal activities as told by your health care provider. Ask your health care provider what activities are safe for you.  Take over-the-counter and prescription medicines only as told by your health care provider.  Do not drive for 24 hours if you were given a sedative during your procedure.  Keep all follow-up visits as told by your health care provider. This is important. Contact a health care provider if you have:  A sore throat that lasts longer than one day.  Trouble swallowing. Get help right away if:  You vomit blood or your vomit looks like coffee grounds.  You have: ? A fever. ? Bloody, black, or tarry stools. ? A severe sore throat or you cannot swallow. ? Difficulty breathing. ? Severe pain in your chest or abdomen. Summary  After the procedure, it is common to have a sore throat, mild stomach discomfort, bloating, and nausea.  Do not drive for 24 hours if you were given a sedative during the procedure.  Follow instructions from your health care provider about what to eat  or drink after your procedure.  Return to your normal activities as told by your health care provider. This information is not intended to replace advice given to you by your health care provider. Make sure you discuss any questions you have with your health care provider. Document Revised: 11/02/2017 Document Reviewed: 10/11/2017 Elsevier Patient Education  [place].

## 2019-12-28 NOTE — Op Note (Signed)
[facility] Patient Name: [name] Procedure Date: 12/28/2019 10:36 AM MRN: [id number] Date of Birth: 11/13/1965 Attending MD: Hildred Laser , MD CSN: [id number] Age: 54 Admit Type: Outpatient Procedure:                Upper GI endoscopy Indications:              Esophageal dysphagia Providers:                Hildred Laser, MD, Charlsie Quest. Theda Sers RN, RN,                            Crystal Page, Nelma Rothman, Technician Referring MD:             Asencion Noble, MD Medicines:                Lidocaine spray, Meperidine 50 mg IV, Midazolam 5                            mg IV Complications:            No immediate complications. Estimated Blood Loss:     Estimated blood loss was minimal. Procedure:                Pre-Anesthesia Assessment:                           - Prior to the procedure, a History and Physical                            was performed, and patient medications and                            allergies were reviewed. The patient's tolerance of                            previous anesthesia was also reviewed. The risks                            and benefits of the procedure and the sedation                            options and risks were discussed with the patient.                            All questions were answered, and informed consent                            was obtained. Prior Anticoagulants: The patient has                            taken no previous anticoagulant or antiplatelet                            agents except for aspirin. ASA Grade Assessment:  III - A patient with severe systemic disease. After                            reviewing the risks and benefits, the patient was                            deemed in satisfactory condition to undergo the                            procedure.                           After obtaining informed consent, the endoscope was                            passed under direct vision. Throughout  the                            procedure, the patient's blood pressure, pulse, and                            oxygen saturations were monitored continuously. The                            [id number]-H190 ([id number]) scope was introduced through the                            mouth, and advanced to the second part of duodenum.                            The upper GI endoscopy was accomplished without                            difficulty. The patient tolerated the procedure                            well. Scope In: 10:59:06 AM Scope Out: 11:11:08 AM Total Procedure Duration: 0 hours 12 minutes 2 seconds  Findings:      The hypopharynx was normal.      Localized mild mucosal changes characterized by discoloration and       longitudinal markings were found in the mid esophagus. Biopsies were       taken with a cold forceps for histology.      LA Grade B (one or more mucosal breaks greater than 5 mm, not extending       between the tops of two mucosal folds) esophagitis with no bleeding was       found 38 to 40 cm from the incisors.      One benign-appearing, intrinsic mild stenosis was found 40 cm from the       incisors. The stenosis was traversed. A TTS dilator was passed through       the scope. Dilation with a 15-16.5-18 mm balloon dilator was performed       to 15 mm, 16.5 mm and 18 mm. The dilation site was examined and showed  mild mucosal disruption, moderate improvement in luminal narrowing and       no perforation.      The entire examined stomach was normal.      The duodenal bulb and second portion of the duodenum were normal. Impression:               - Normal hypopharynx.                           - Discolored, longitudinally marked mucosa in the                            esophagus. Biopsied.                           - LA Grade B reflux esophagitis with no bleeding.                           - Benign-appearing esophageal stenosis. Dilated.                           - Normal  stomach.                           - Normal duodenal bulb and second portion of the                            duodenum. Moderate Sedation:      Moderate (conscious) sedation was administered by the endoscopy nurse       and supervised by the endoscopist. The following parameters were       monitored: oxygen saturation, heart rate, blood pressure, CO2       capnography and response to care. Total physician intraservice time was       17 minutes. Recommendation:           - Patient has a contact number available for                            emergencies. The signs and symptoms of potential                            delayed complications were discussed with the                            patient. Return to normal activities tomorrow.                            Written discharge instructions were provided to the                            patient.                           - Resume previous diet today.                           - Continue present medications but discontinue  Odefsey and begin Biktary one tablet by mouth                            daily( discussed with Dr. Tommy Medal).                           - Use Protonix (pantoprazole) 40 mg PO daily 30                            minutes before breakfast.                           - No aspirin, ibuprofen, naproxen, or other                            non-steroidal anti-inflammatory drugs for 3 days.                           - Await pathology results.                           - Followup with Dr. Rhina Brackett Dam in one month. Procedure Code(s):        --- Professional ---                           [phone], Esophagogastroduodenoscopy, flexible,                            transoral; with transendoscopic balloon dilation of                            esophagus (less than 30 mm diameter)                           G0500, Moderate sedation services provided by the                            same physician or other  qualified health care                            professional performing a gastrointestinal                            endoscopic service that sedation supports,                            requiring the presence of an independent trained                            observer to assist in the monitoring of the                            patient's level of consciousness and physiological  status; initial 15 minutes of intra-service time;                            patient age 49 years or older (additional time may                            be reported with [phone], as appropriate) Diagnosis Code(s):        --- Professional ---                           K22.8, Other specified diseases of esophagus                           K21.00, Gastro-esophageal reflux disease with                            esophagitis, without bleeding                           K22.2, Esophageal obstruction                           R13.14, Dysphagia, pharyngoesophageal phase CPT copyright 2019 American Medical Association. All rights reserved. The codes documented in this report are preliminary and upon coder review may  be revised to meet current compliance requirements. Hildred Laser, MD Hildred Laser, MD 12/28/2019 11:50:10 AM This report has been signed electronically. Number of Addenda: 0

## 2019-12-29 ENCOUNTER — Telehealth: Payer: Self-pay

## 2019-12-29 ENCOUNTER — Other Ambulatory Visit: Payer: Self-pay | Admitting: Pharmacist

## 2019-12-29 ENCOUNTER — Other Ambulatory Visit: Payer: Self-pay | Admitting: Infectious Disease

## 2019-12-29 DIAGNOSIS — B20 Human immunodeficiency virus [HIV] disease: Secondary | ICD-10-CM

## 2019-12-29 LAB — SURGICAL PATHOLOGY

## 2019-12-29 MED ORDER — BICTEGRAVIR-EMTRICITAB-TENOFOV 50-200-25 MG PO TABS: 1.0000 | ORAL_TABLET | Freq: Every day | ORAL | 11 refills | Status: DC

## 2019-12-29 NOTE — Telephone Encounter (Signed)
Patient called office requesting to speak with Dr. Tommy Medal about endoscopy results.   Tucker Minter Lorita Officer, RN

## 2019-12-29 NOTE — Progress Notes (Signed)
I have called patient and reviewed new medicine Biktarvy which he will pick up on Monday

## 2019-12-29 NOTE — Telephone Encounter (Signed)
Got ahold of him

## 2020-01-01 MED FILL — BASAGLAR 100 UNIT/ML KWIKPE: 100 | 28 days supply | Qty: 30 | Fill #1

## 2020-01-01 MED FILL — BIKTARVY 50-200-25 MG TABS: 50-200-25 | 30 days supply | Qty: 30 | Fill #0

## 2020-01-02 ENCOUNTER — Encounter (HOSPITAL_COMMUNITY): Payer: Self-pay | Admitting: Internal Medicine

## 2020-01-09 ENCOUNTER — Encounter (INDEPENDENT_AMBULATORY_CARE_PROVIDER_SITE_OTHER): Payer: Self-pay | Admitting: Physician Assistant

## 2020-01-09 ENCOUNTER — Ambulatory Visit (INDEPENDENT_AMBULATORY_CARE_PROVIDER_SITE_OTHER): Payer: 59 | Admitting: Physician Assistant

## 2020-01-09 ENCOUNTER — Other Ambulatory Visit: Payer: Self-pay

## 2020-01-09 VITALS — BP 152/84 | HR 92 | Temp 97.9°F | Ht 66.0 in | Wt 196.0 lb

## 2020-01-09 DIAGNOSIS — E785 Hyperlipidemia, unspecified: Secondary | ICD-10-CM | POA: Diagnosis not present

## 2020-01-09 DIAGNOSIS — Z9189 Other specified personal risk factors, not elsewhere classified: Secondary | ICD-10-CM

## 2020-01-09 DIAGNOSIS — E559 Vitamin D deficiency, unspecified: Secondary | ICD-10-CM

## 2020-01-09 DIAGNOSIS — E669 Obesity, unspecified: Secondary | ICD-10-CM | POA: Diagnosis not present

## 2020-01-09 DIAGNOSIS — Z6831 Body mass index (BMI) 31.0-31.9, adult: Secondary | ICD-10-CM | POA: Diagnosis not present

## 2020-01-09 DIAGNOSIS — E1169 Type 2 diabetes mellitus with other specified complication: Secondary | ICD-10-CM | POA: Diagnosis not present

## 2020-01-09 MED ORDER — METFORMIN HCL ER (MOD) 1000 MG PO TB24: 1000.0000 mg | ORAL_TABLET | Freq: Two times a day (BID) | ORAL | 0 refills | Status: DC

## 2020-01-09 MED ORDER — VITAMIN D (ERGOCALCIFEROL) 1.25 MG (50000 UNIT) PO CAPS: 50000.0000 [IU] | ORAL_CAPSULE | ORAL | 0 refills | Status: DC

## 2020-01-09 MED ORDER — OZEMPIC (0.25 OR 0.5 MG/DOSE) 2 MG/1.5ML ~~LOC~~ SOPN: 0.5000 mg | PEN_INJECTOR | SUBCUTANEOUS | 0 refills | Status: DC

## 2020-01-09 MED FILL — OZEMPIC 0.25 OR 0.5 MG/DOSE: 2 | 28 days supply | Qty: 2 | Fill #0

## 2020-01-09 MED FILL — VIT D2 1.25 MG (50,000 UNIT: 1.25 MG | 30 days supply | Qty: 10 | Fill #0

## 2020-01-10 NOTE — Progress Notes (Signed)
Chief Complaint:   OBESITY [name] is here to discuss his progress with his obesity treatment plan along with follow-up of his obesity related diagnoses. [name] is on the Category 3 Plan and states he is following his eating plan approximately 0% of the time. [name] states he is walking the dog.   Today's visit was #: 61 Starting weight: 199 lbs Starting date: 03/14/2018 Today's weight: 196 lbs Today's date: 01/09/2020 Total lbs lost to date: 3 Total lbs lost since last in-office visit: 1  Interim History: [name] recently had a dysphagic reaction to his HIV medication and went to the ER. He had an EGD, had esophageal dilation and polyps were found. His HIV medications were changed. He states GI discontinued his Jardiance, but is unsure of the reason and it is not in note from GI.  Subjective:   Type 2 diabetes mellitus with hyperlipidemia ([place]). Fasting blood sugars are in the range of 70's to 250's; after meals 150-300. [name] states that he has been eating whatever he can swallow. He has a questionable history of type I versus type II diabetes mellitus. GI stopped Jardiance for whatever reason.  Lab Results  Component Value Date   HGBA1C 8.0 (H) 09/26/2019   HGBA1C 9.6 (H) 06/27/2019   HGBA1C 9.1 (H) 02/13/2019   Lab Results  Component Value Date   MICROALBUR 1.08 12/20/2013   LDLCALC 88 09/26/2019   CREATININE 0.96 11/22/2019   No results found for: INSULIN  Vitamin D deficiency. No nausea, vomiting, or muscle weakness on prescription Vitamin D supplementation.    Ref. Range 09/26/2019 11:32  Vitamin D, 25-Hydroxy Latest Ref Range: 30.0 - 100.0 ng/mL 45.0   At risk for hypoglycemia. [name] is at increased risk for hypoglycemia due to changes in diet, diagnosis of diabetes, and/or insulin use.  Assessment/Plan:   Type 2 diabetes mellitus with hyperlipidemia ([place]). Good blood sugar control is important to decrease the likelihood of diabetic complications such as  nephropathy, neuropathy, limb loss, blindness, coronary artery disease, and death. Intensive lifestyle modification including diet, exercise and weight loss are the first line of treatment for diabetes. [place]ight will change to Semaglutide,0.25 or 0.5MG /DOS, (OZEMPIC, 0.25 OR 0.5 MG/DOSE,) 2 MG/1.5ML SOPN 0.25 mg #1 with 0 refills and will discontinue Victoza. Prescription was given for metFORMIN (GLUMETZA) 1000 MG (MOD) 24 hr tablet BID #60 with 0 refills. Ambulatory referral to Endocrinology, Dr. Cruzita Lederer.  Vitamin D deficiency. Low Vitamin D level contributes to fatigue and are associated with obesity, breast, and colon cancer. He was given a refill on his Vitamin D, Ergocalciferol, (DRISDOL) 1.25 MG (50000 UNIT) CAPS capsule every 3 days #10 with 0 refills and will follow-up for routine testing of Vitamin D, at least 2-3 times per year to avoid over-replacement.   At risk for hypoglycemia. [name] was given approximately 15 minutes of counseling today regarding prevention of hypoglycemia. He was advised of symptoms of hypoglycemia. [name] was instructed to avoid skipping meals, eat regular protein rich meals and schedule low calorie snacks as needed.   Repetitive spaced learning was employed today to elicit superior memory formation and behavioral change.  Class 1 obesity with serious comorbidity and body mass index (BMI) of 31.0 to 31.9 in adult, unspecified obesity type.   [name] is currently in the action stage of change. As such, his goal is to continue with weight loss efforts. He has agreed to keeping a food journal and adhering to recommended goals of 1500 calories and 95  grams of protein.   Fasting labs will be obtained at his next visit along with C-peptide.  Exercise goals: For substantial health benefits, adults should do at least 150 minutes (2 hours and 30 minutes) a week of moderate-intensity, or 75 minutes (1 hour and 15 minutes) a week of vigorous-intensity aerobic physical activity, or an  equivalent combination of moderate- and vigorous-intensity aerobic activity. Aerobic activity should be performed in episodes of at least 10 minutes, and preferably, it should be spread throughout the week.  Behavioral modification strategies: meal planning and cooking strategies and keeping healthy foods in the home.  [name] has agreed to follow-up with our clinic in 2 weeks. He was informed of the importance of frequent follow-up visits to maximize his success with intensive lifestyle modifications for his multiple health conditions.   Objective:   Blood pressure (!) 152/84, pulse 92, temperature 97.9 F (36.6 C), temperature source Oral, height 5\' 6"  (1.676 m), weight 196 lb (88.9 kg), SpO2 97 %. Body mass index is 31.64 kg/m.  General: Cooperative, alert, well developed, in no acute distress. HEENT: Conjunctivae and lids unremarkable. Cardiovascular: Regular rhythm.  Lungs: Normal work of breathing. Neurologic: No focal deficits.   Lab Results  Component Value Date   CREATININE 0.96 11/22/2019   BUN 27 (H) 11/22/2019   NA 137 11/22/2019   K 3.6 11/22/2019   CL 107 11/22/2019   CO2 21 (L) 11/22/2019   Lab Results  Component Value Date   ALT 35 11/21/2019   AST 19 11/21/2019   ALKPHOS 67 11/21/2019   BILITOT 1.0 11/21/2019   Lab Results  Component Value Date   HGBA1C 8.0 (H) 09/26/2019   HGBA1C 9.6 (H) 06/27/2019   HGBA1C 9.1 (H) 02/13/2019   HGBA1C 10.2 (H) 07/18/2018   HGBA1C 10.4 (H) 03/14/2018   No results found for: INSULIN Lab Results  Component Value Date   TSH 1.240 02/13/2019   Lab Results  Component Value Date   CHOL 161 09/26/2019   HDL 40 09/26/2019   LDLCALC 88 09/26/2019   TRIG 193 (H) 09/26/2019   CHOLHDL 5.5 (H) 03/14/2019   Lab Results  Component Value Date   WBC 10.5 11/22/2019   HGB 13.7 11/22/2019   HCT 43.1 11/22/2019   MCV 89.4 11/22/2019   PLT 289 11/22/2019   No results found for: IRON, TIBC, FERRITIN  Attestation Statements:     Reviewed by clinician on day of visit: allergies, medications, problem list, medical history, surgical history, family history, social history, and previous encounter notes.  IMichaelene Woods, am acting as transcriptionist for Abby Potash, PA-C   I have reviewed the above documentation for accuracy and completeness, and I agree with the above. Abby Potash, PA-C

## 2020-01-21 ENCOUNTER — Encounter (INDEPENDENT_AMBULATORY_CARE_PROVIDER_SITE_OTHER): Payer: Self-pay | Admitting: Physician Assistant

## 2020-01-22 MED FILL — FREESTYLE LIBRE 14 DAY SENS: 84 days supply | Qty: 6 | Fill #1

## 2020-01-23 ENCOUNTER — Ambulatory Visit (INDEPENDENT_AMBULATORY_CARE_PROVIDER_SITE_OTHER): Payer: 59 | Admitting: Physician Assistant

## 2020-01-23 ENCOUNTER — Encounter (INDEPENDENT_AMBULATORY_CARE_PROVIDER_SITE_OTHER): Payer: Self-pay | Admitting: Physician Assistant

## 2020-01-23 ENCOUNTER — Other Ambulatory Visit: Payer: Self-pay

## 2020-01-23 VITALS — BP 167/84 | HR 93 | Temp 98.4°F | Ht 66.0 in | Wt 193.0 lb

## 2020-01-23 DIAGNOSIS — E1169 Type 2 diabetes mellitus with other specified complication: Secondary | ICD-10-CM | POA: Diagnosis not present

## 2020-01-23 DIAGNOSIS — E559 Vitamin D deficiency, unspecified: Secondary | ICD-10-CM | POA: Diagnosis not present

## 2020-01-23 DIAGNOSIS — E7849 Other hyperlipidemia: Secondary | ICD-10-CM

## 2020-01-23 DIAGNOSIS — Z6832 Body mass index (BMI) 32.0-32.9, adult: Secondary | ICD-10-CM | POA: Diagnosis not present

## 2020-01-23 DIAGNOSIS — E669 Obesity, unspecified: Secondary | ICD-10-CM

## 2020-01-23 DIAGNOSIS — Z9189 Other specified personal risk factors, not elsewhere classified: Secondary | ICD-10-CM | POA: Diagnosis not present

## 2020-01-23 DIAGNOSIS — E785 Hyperlipidemia, unspecified: Secondary | ICD-10-CM | POA: Diagnosis not present

## 2020-01-23 NOTE — Progress Notes (Signed)
Chief Complaint:   OBESITY Shown [name] is here to discuss his progress with his obesity treatment plan along with follow-up of his obesity related diagnoses. [name] is on the Category 3 Plan and states he is following his eating plan approximately 85% of the time. [name] states he is walking the dog 30 minutes 2 times per week.  Today's visit was #: 62 Starting weight: 199 lbs Starting date: 03/14/2018 Today's weight: 193 lbs Today's date: 01/23/2020 Total lbs lost to date: 6 Total lbs lost since last in-office visit: 3  Interim History: [name] reports that he has trouble eating on plan when at work due to his busy schedule. That is when he is most likely to skip meals. He has not been taking his blood sugars regularly due to having to remove the sensor from his arm during an x-ray and he was not able to get a replacement prescription..  Subjective:   Type 2 diabetes mellitus with hyperlipidemia ([place]). Hulan is on Ozempic, Humalog, and Lantus. Fasting blood sugars are in the range of 150 to 170 with postprandials averaging 170 to 270. He has an appointment with Dr. Cruzita Lederer coming up. He is not checking his blood sugars consistently.  Lab Results  Component Value Date   HGBA1C 8.0 (H) 09/26/2019   HGBA1C 9.6 (H) 06/27/2019   HGBA1C 9.1 (H) 02/13/2019   Lab Results  Component Value Date   MICROALBUR 1.08 12/20/2013   LDLCALC 88 09/26/2019   CREATININE 0.96 11/22/2019   No results found for: INSULIN  Other hyperlipidemia. [name] is on Crestor. No chest pain or myalgias. He is due for labs.   Lab Results  Component Value Date   CHOL 161 09/26/2019   HDL 40 09/26/2019   LDLCALC 88 09/26/2019   TRIG 193 (H) 09/26/2019   CHOLHDL 5.5 (H) 03/14/2019   Lab Results  Component Value Date   ALT 35 11/21/2019   AST 19 11/21/2019   ALKPHOS 67 11/21/2019   BILITOT 1.0 11/21/2019   The 10-year ASCVD risk score [name] DC Jr., et al., 2013) is: 16.5%   Values used to calculate the  score:     Age: 38 years     Sex: Male     Is Non-Hispanic African American: No     Diabetic: Yes     Tobacco smoker: No     Systolic Blood Pressure: 226 mmHg     Is BP treated: Yes     HDL Cholesterol: 40 mg/dL     Total Cholesterol: 161 mg/dL  Vitamin D deficiency. [name] is on prescription Vitamin D twice weekly. No nausea, vomiting, or muscle weakness.    Ref. Range 09/26/2019 11:32  Vitamin D, 25-Hydroxy Latest Ref Range: 30.0 - 100.0 ng/mL 45.0   At risk for hypoglycemia. [name] is at increased risk for hypoglycemia due to changes in diet, diagnosis of diabetes, and/or insulin use.   Assessment/Plan:   Type 2 diabetes mellitus with hyperlipidemia ([place]). Good blood sugar control is important to decrease the likelihood of diabetic complications such as nephropathy, neuropathy, limb loss, blindness, coronary artery disease, and death. Intensive lifestyle modification including diet, exercise and weight loss are the first line of treatment for diabetes. Jaiveer will see Dr. Cruzita Lederer for an initial visit in October 2021. He will increase his Ozempic to 1 mg every week. Labs will be checked today.  Other hyperlipidemia. Cardiovascular risk and specific lipid/LDL goals reviewed.  We discussed several lifestyle modifications today and Ilan will  continue to work on diet, exercise and weight loss efforts. Orders and follow up as documented in patient record. He will continue his medication as directed.   Counseling Intensive lifestyle modifications are the first line treatment for this issue. . Dietary changes: Increase soluble fiber. Decrease simple carbohydrates. . Exercise changes: Moderate to vigorous-intensity aerobic activity 150 minutes per week if tolerated.  . Lipid-lowering medications: see documented in medical record.  Vitamin D deficiency. Low Vitamin D level contributes to fatigue and are associated with obesity, breast, and colon cancer. He was given a refill on prescription  Vitamin D @50 ,000 IU every 3 days #10 with 0 refills and VITAMIN D 25 Hydroxy (Vit-D Deficiency, Fractures) level will be checked today.  At risk for hypoglycemia. [name] was given approximately 15 minutes of counseling today regarding prevention of hypoglycemia. He was advised of symptoms of hypoglycemia. [name] was instructed to avoid skipping meals, eat regular protein rich meals and schedule low calorie snacks as needed.   Repetitive spaced learning was employed today to elicit superior memory formation and behavioral change.  Class 1 obesity with serious comorbidity and body mass index (BMI) of 32.0 to 32.9 in adult, unspecified obesity type.   Torion is currently in the action stage of change. As such, his goal is to continue with weight loss efforts. He has agreed to the Category 3 Plan.   Exercise goals: For substantial health benefits, adults should do at least 150 minutes (2 hours and 30 minutes) a week of moderate-intensity, or 75 minutes (1 hour and 15 minutes) a week of vigorous-intensity aerobic physical activity, or an equivalent combination of moderate- and vigorous-intensity aerobic activity. Aerobic activity should be performed in episodes of at least 10 minutes, and preferably, it should be spread throughout the week.  Behavioral modification strategies: increasing lean protein intake and no skipping meals.  [name] has agreed to follow-up with our clinic in 3 weeks. He was informed of the importance of frequent follow-up visits to maximize his success with intensive lifestyle modifications for his multiple health conditions.   Objective:   Blood pressure (!) 167/84, pulse 93, temperature 98.4 F (36.9 C), temperature source Oral, height 5\' 6"  (1.676 m), weight 193 lb (87.5 kg), SpO2 97 %. Body mass index is 31.15 kg/m.  General: Cooperative, alert, well developed, in no acute distress. HEENT: Conjunctivae and lids unremarkable. Cardiovascular: Regular rhythm.  Lungs: Normal  work of breathing. Neurologic: No focal deficits.   Lab Results  Component Value Date   CREATININE 0.96 11/22/2019   BUN 27 (H) 11/22/2019   NA 137 11/22/2019   K 3.6 11/22/2019   CL 107 11/22/2019   CO2 21 (L) 11/22/2019   Lab Results  Component Value Date   ALT 35 11/21/2019   AST 19 11/21/2019   ALKPHOS 67 11/21/2019   BILITOT 1.0 11/21/2019   Lab Results  Component Value Date   HGBA1C 8.0 (H) 09/26/2019   HGBA1C 9.6 (H) 06/27/2019   HGBA1C 9.1 (H) 02/13/2019   HGBA1C 10.2 (H) 07/18/2018   HGBA1C 10.4 (H) 03/14/2018   No results found for: INSULIN Lab Results  Component Value Date   TSH 1.240 02/13/2019   Lab Results  Component Value Date   CHOL 161 09/26/2019   HDL 40 09/26/2019   LDLCALC 88 09/26/2019   TRIG 193 (H) 09/26/2019   CHOLHDL 5.5 (H) 03/14/2019   Lab Results  Component Value Date   WBC 10.5 11/22/2019   HGB 13.7 11/22/2019   HCT 43.1  11/22/2019   MCV 89.4 11/22/2019   PLT 289 11/22/2019   No results found for: IRON, TIBC, FERRITIN  Attestation Statements:   Reviewed by clinician on day of visit: allergies, medications, problem list, medical history, surgical history, family history, social history, and previous encounter notes.  IMichaelene Song, am acting as transcriptionist for Abby Potash, PA-C   I have reviewed the above documentation for accuracy and completeness, and I agree with the above. Abby Potash, PA-C

## 2020-01-24 LAB — LIPID PANEL
Chol/HDL Ratio: 6.9 ratio — ABNORMAL HIGH (ref 0.0–5.0)
Cholesterol, Total: 281 mg/dL — ABNORMAL HIGH (ref 100–199)
HDL: 41 mg/dL (ref >39)
LDL Chol Calc (NIH): 200 mg/dL — ABNORMAL HIGH (ref 0–99)
Triglycerides: 208 mg/dL — ABNORMAL HIGH (ref 0–149)
VLDL Cholesterol Cal: 40 mg/dL (ref 5–40)

## 2020-01-24 LAB — COMPREHENSIVE METABOLIC PANEL
ALT: 27 IU/L (ref 0–44)
AST: 24 IU/L (ref 0–40)
Albumin/Globulin Ratio: 1.9 (ref 1.2–2.2)
Albumin: 4.4 g/dL (ref 3.8–4.9)
Alkaline Phosphatase: 79 IU/L (ref 48–121)
BUN/Creatinine Ratio: 19 (ref 9–20)
BUN: 16 mg/dL (ref 6–24)
Bilirubin Total: 0.3 mg/dL (ref 0.0–1.2)
CO2: 22 mmol/L (ref 20–29)
Calcium: 9.3 mg/dL (ref 8.7–10.2)
Chloride: 104 mmol/L (ref 96–106)
Creatinine, Ser: 0.83 mg/dL (ref 0.76–1.27)
GFR calc Af Amer: 115 mL/min/{1.73_m2} (ref >59)
GFR calc non Af Amer: 100 mL/min/{1.73_m2} (ref >59)
Globulin, Total: 2.3 g/dL (ref 1.5–4.5)
Glucose: 95 mg/dL (ref 65–99)
Potassium: 4.3 mmol/L (ref 3.5–5.2)
Sodium: 143 mmol/L (ref 134–144)
Total Protein: 6.7 g/dL (ref 6.0–8.5)

## 2020-01-24 LAB — MICROALBUMIN, URINE: Microalbumin, Urine: 120.3 ug/mL

## 2020-01-24 LAB — VITAMIN D 25 HYDROXY (VIT D DEFICIENCY, FRACTURES): Vit D, 25-Hydroxy: 24.8 ng/mL — ABNORMAL LOW (ref 30.0–100.0)

## 2020-01-24 LAB — HEMOGLOBIN A1C
Est. average glucose Bld gHb Est-mCnc: 206 mg/dL
Hgb A1c MFr Bld: 8.8 % — ABNORMAL HIGH (ref 4.8–5.6)

## 2020-01-30 MED FILL — BIKTARVY 50-200-25 MG TABS: 50-200-25 | 30 days supply | Qty: 30 | Fill #1

## 2020-02-13 ENCOUNTER — Ambulatory Visit (INDEPENDENT_AMBULATORY_CARE_PROVIDER_SITE_OTHER): Payer: 59 | Admitting: Physician Assistant

## 2020-02-13 ENCOUNTER — Encounter (INDEPENDENT_AMBULATORY_CARE_PROVIDER_SITE_OTHER): Payer: Self-pay | Admitting: Physician Assistant

## 2020-02-13 ENCOUNTER — Other Ambulatory Visit: Payer: Self-pay

## 2020-02-13 ENCOUNTER — Other Ambulatory Visit (HOSPITAL_COMMUNITY): Payer: Self-pay | Admitting: Bariatrics

## 2020-02-13 VITALS — BP 163/91 | HR 93 | Temp 98.1°F | Ht 66.0 in | Wt 192.0 lb

## 2020-02-13 DIAGNOSIS — E1169 Type 2 diabetes mellitus with other specified complication: Secondary | ICD-10-CM | POA: Diagnosis not present

## 2020-02-13 DIAGNOSIS — E7849 Other hyperlipidemia: Secondary | ICD-10-CM | POA: Diagnosis not present

## 2020-02-13 DIAGNOSIS — E669 Obesity, unspecified: Secondary | ICD-10-CM | POA: Diagnosis not present

## 2020-02-13 DIAGNOSIS — E559 Vitamin D deficiency, unspecified: Secondary | ICD-10-CM

## 2020-02-13 DIAGNOSIS — Z6831 Body mass index (BMI) 31.0-31.9, adult: Secondary | ICD-10-CM

## 2020-02-13 DIAGNOSIS — E785 Hyperlipidemia, unspecified: Secondary | ICD-10-CM

## 2020-02-13 DIAGNOSIS — Z9189 Other specified personal risk factors, not elsewhere classified: Secondary | ICD-10-CM | POA: Diagnosis not present

## 2020-02-13 MED ORDER — OZEMPIC (0.25 OR 0.5 MG/DOSE) 2 MG/1.5ML ~~LOC~~ SOPN: 0.5000 mg | PEN_INJECTOR | SUBCUTANEOUS | 0 refills | Status: DC

## 2020-02-13 MED ORDER — "BD DISP NEEDLES 27G X 1/2"" MISC": 0 refills | Status: DC

## 2020-02-13 MED ORDER — METFORMIN HCL ER (MOD) 1000 MG PO TB24: 1000.0000 mg | ORAL_TABLET | Freq: Two times a day (BID) | ORAL | 0 refills | Status: DC

## 2020-02-13 MED ORDER — VITAMIN D (ERGOCALCIFEROL) 1.25 MG (50000 UNIT) PO CAPS: 50000.0000 [IU] | ORAL_CAPSULE | ORAL | 0 refills | Status: DC

## 2020-02-13 MED FILL — VIT D2 1.25 MG (50,000 UNIT: 1.25 MG | 30 days supply | Qty: 10 | Fill #0

## 2020-02-13 MED FILL — OZEMPIC 0.25 OR 0.5 MG/DOSE: 2 | 84 days supply | Qty: 5 | Fill #0

## 2020-02-13 MED FILL — BD TB SYRINGE 27GX1/2: 27G X 1/2" | 84 days supply | Qty: 12 | Fill #0

## 2020-02-14 NOTE — Progress Notes (Signed)
Chief Complaint:   OBESITY [name] is here to discuss his progress with his obesity treatment plan along with follow-up of his obesity related diagnoses. [name] is on the Category 3 Plan and states he is following his eating plan approximately 80% of the time. Shine states he is walking the dog for exercise.  Today's visit was #: 30 Starting weight: 199 lbs Starting date: 03/14/2018 Today's weight: 192 lbs Today's date: 02/13/2020 Total lbs lost to date: 7 Total lbs lost since last in-office visit: 1  Interim History: [name] states that he has been eating Special K cereal for breakfast and has been feeling more bloated. His lunch meal often gets pushed back because of a busy work schedule.  Subjective:   Type 2 diabetes mellitus with hyperlipidemia ([place]). [name] reports one episode of hypoglycemia at 44 after he ate a house salad. He is on metformin, Ozempic, and Lantus. He has an appointment with Dr. Cruzita Lederer next month.   Lab Results  Component Value Date   HGBA1C 8.8 (H) 01/23/2020   HGBA1C 8.0 (H) 09/26/2019   HGBA1C 9.6 (H) 06/27/2019   Lab Results  Component Value Date   MICROALBUR 1.08 12/20/2013   LDLCALC 200 (H) 01/23/2020   CREATININE 0.83 01/23/2020   No results found for: INSULIN  Other hyperlipidemia. [name] is on Crestor 40 mg daily. He is managed by his PCP, Dr. Asencion Noble. His last levels were remarkably worse.  Lab Results  Component Value Date   CHOL 281 (H) 01/23/2020   HDL 41 01/23/2020   LDLCALC 200 (H) 01/23/2020   TRIG 208 (H) 01/23/2020   CHOLHDL 6.9 (H) 01/23/2020   Lab Results  Component Value Date   ALT 27 01/23/2020   AST 24 01/23/2020   ALKPHOS 79 01/23/2020   BILITOT 0.3 01/23/2020   The 10-year ASCVD risk score Omar Bussing DC Jr., et al., 2013) is: 28.5%   Values used to calculate the score:     Age: 54 years     Sex: Male     Is Non-Hispanic African American: No     Diabetic: Yes     Tobacco smoker: No     Systolic Blood  Pressure: 163 mmHg     Is BP treated: Yes     HDL Cholesterol: 41 mg/dL     Total Cholesterol: 281 mg/dL  Vitamin D deficiency. [name] is on Vitamin D twice weekly and states he thinks he is taking it as prescribed.   Ref. Range 01/23/2020 08:14  Vitamin D, 25-Hydroxy Latest Ref Range: 30.0 - 100.0 ng/mL 24.8 (L)   At risk for obstructive sleep apnea. [name] is at increased risk for sleep apnea due to obesity.  Assessment/Plan:   Type 2 diabetes mellitus with hyperlipidemia ([place]). Good blood sugar control is important to decrease the likelihood of diabetic complications such as nephropathy, neuropathy, limb loss, blindness, coronary artery disease, and death. Intensive lifestyle modification including diet, exercise and weight loss are the first line of treatment for diabetes. Refills were given for metFORMIN (GLUMETZA) 1000 MG (MOD) 24 hr tablet #60 with 0 refills, Semaglutide,0.25 or 0.5MG /DOS, (OZEMPIC, 0.25 OR 0.5 MG/DOSE,) 2 MG/1.5ML SOPN #5 pens with 0 refills, and NEEDLE, DISP, 27 G (BD DISP NEEDLES) 27G X 1/2" MISC #100 with 0 refills.  Other hyperlipidemia. Cardiovascular risk and specific lipid/LDL goals reviewed.  We discussed several lifestyle modifications today and Shawnta will continue to work on diet, exercise and weight loss efforts. Orders and follow up  as documented in patient record. Jujuan will follow-up with his PCP regarding his lipid panel and medication adjustment.  Counseling Intensive lifestyle modifications are the first line treatment for this issue. . Dietary changes: Increase soluble fiber. Decrease simple carbohydrates. . Exercise changes: Moderate to vigorous-intensity aerobic activity 150 minutes per week if tolerated. . Lipid-lowering medications: see documented in medical record.  Vitamin D deficiency. Low Vitamin D level contributes to fatigue and are associated with obesity, breast, and colon cancer. He was given a refill on his Vitamin D, Ergocalciferol,  (DRISDOL) 1.25 MG (50000 UNIT) CAPS capsule every 3 days #10 with 0 refills and will follow-up for routine testing of Vitamin D, at least 2-3 times per year to avoid over-replacement.   At risk for obstructive sleep apnea. [name] was given approximately 15 minutes of coronary artery disease prevention counseling today. He is 54 y.o. male and has risk factors for obstructive sleep apnea including obesity. We discussed intensive lifestyle modifications today with an emphasis on specific weight loss instructions and strategies.  Repetitive spaced learning was employed today to elicit superior memory formation and behavioral change.  Class 1 obesity with serious comorbidity and body mass index (BMI) of 31.0 to 31.9 in adult, unspecified obesity type.   [name] is currently in the action stage of change. As such, his goal is to continue with weight loss efforts. He has agreed to the Category 3 Plan.   Exercise goals: For substantial health benefits, adults should do at least 150 minutes (2 hours and 30 minutes) a week of moderate-intensity, or 75 minutes (1 hour and 15 minutes) a week of vigorous-intensity aerobic physical activity, or an equivalent combination of moderate- and vigorous-intensity aerobic activity. Aerobic activity should be performed in episodes of at least 10 minutes, and preferably, it should be spread throughout the week.  Behavioral modification strategies: increasing lean protein intake and no skipping meals.  [name] has agreed to follow-up with our clinic in 3 weeks. He was informed of the importance of frequent follow-up visits to maximize his success with intensive lifestyle modifications for his multiple health conditions.   Objective:   Blood pressure (!) 163/91, pulse 93, temperature 98.1 F (36.7 C), height 5\' 6"  (1.676 m), weight 192 lb (87.1 kg), SpO2 97 %. Body mass index is 30.99 kg/m.  General: Cooperative, alert, well developed, in no acute distress. HEENT:  Conjunctivae and lids unremarkable. Cardiovascular: Regular rhythm.  Lungs: Normal work of breathing. Neurologic: No focal deficits.   Lab Results  Component Value Date   CREATININE 0.83 01/23/2020   BUN 16 01/23/2020   NA 143 01/23/2020   K 4.3 01/23/2020   CL 104 01/23/2020   CO2 22 01/23/2020   Lab Results  Component Value Date   ALT 27 01/23/2020   AST 24 01/23/2020   ALKPHOS 79 01/23/2020   BILITOT 0.3 01/23/2020   Lab Results  Component Value Date   HGBA1C 8.8 (H) 01/23/2020   HGBA1C 8.0 (H) 09/26/2019   HGBA1C 9.6 (H) 06/27/2019   HGBA1C 9.1 (H) 02/13/2019   HGBA1C 10.2 (H) 07/18/2018   No results found for: INSULIN Lab Results  Component Value Date   TSH 1.240 02/13/2019   Lab Results  Component Value Date   CHOL 281 (H) 01/23/2020   HDL 41 01/23/2020   LDLCALC 200 (H) 01/23/2020   TRIG 208 (H) 01/23/2020   CHOLHDL 6.9 (H) 01/23/2020   Lab Results  Component Value Date   WBC 10.5 11/22/2019   HGB 13.7  11/22/2019   HCT 43.1 11/22/2019   MCV 89.4 11/22/2019   PLT 289 11/22/2019   No results found for: IRON, TIBC, FERRITIN  Attestation Statements:   Reviewed by clinician on day of visit: allergies, medications, problem list, medical history, surgical history, family history, social history, and previous encounter notes.  IMichaelene Song, am acting as transcriptionist for Abby Potash, PA-C   I have reviewed the above documentation for accuracy and completeness, and I agree with the above. Abby Potash, PA-C

## 2020-02-16 ENCOUNTER — Other Ambulatory Visit (INDEPENDENT_AMBULATORY_CARE_PROVIDER_SITE_OTHER): Payer: Self-pay | Admitting: Physician Assistant

## 2020-02-16 DIAGNOSIS — Z794 Long term (current) use of insulin: Secondary | ICD-10-CM

## 2020-02-16 DIAGNOSIS — E1165 Type 2 diabetes mellitus with hyperglycemia: Secondary | ICD-10-CM

## 2020-02-19 ENCOUNTER — Other Ambulatory Visit (INDEPENDENT_AMBULATORY_CARE_PROVIDER_SITE_OTHER): Payer: Self-pay | Admitting: Physician Assistant

## 2020-02-19 DIAGNOSIS — Z794 Long term (current) use of insulin: Secondary | ICD-10-CM

## 2020-02-19 DIAGNOSIS — E1165 Type 2 diabetes mellitus with hyperglycemia: Secondary | ICD-10-CM

## 2020-02-20 ENCOUNTER — Other Ambulatory Visit (INDEPENDENT_AMBULATORY_CARE_PROVIDER_SITE_OTHER): Payer: Self-pay | Admitting: Bariatrics

## 2020-02-20 MED ORDER — METFORMIN HCL ER 500 MG PO TB24: 1000.0000 mg | ORAL_TABLET | Freq: Two times a day (BID) | ORAL | 0 refills | Status: DC

## 2020-02-20 MED FILL — metFORMIN HCL ER 500 MG TB2: 500 | 30 days supply | Qty: 120 | Fill #0

## 2020-02-21 MED FILL — BASAGLAR 100 UNIT/ML KWIKPE: 100 | 28 days supply | Qty: 30 | Fill #2

## 2020-02-23 ENCOUNTER — Encounter: Payer: Self-pay | Admitting: Internal Medicine

## 2020-02-26 MED FILL — BIKTARVY 50-200-25 MG TABS: 50-200-25 | 30 days supply | Qty: 30 | Fill #2

## 2020-02-27 ENCOUNTER — Other Ambulatory Visit: Payer: Self-pay | Admitting: Internal Medicine

## 2020-02-27 ENCOUNTER — Other Ambulatory Visit: Payer: Self-pay

## 2020-02-27 ENCOUNTER — Encounter: Payer: Self-pay | Admitting: Internal Medicine

## 2020-02-27 ENCOUNTER — Ambulatory Visit (INDEPENDENT_AMBULATORY_CARE_PROVIDER_SITE_OTHER): Payer: 59 | Admitting: Internal Medicine

## 2020-02-27 VITALS — BP 160/90 | HR 101 | Ht 66.0 in | Wt 198.0 lb

## 2020-02-27 DIAGNOSIS — E1165 Type 2 diabetes mellitus with hyperglycemia: Secondary | ICD-10-CM | POA: Diagnosis not present

## 2020-02-27 DIAGNOSIS — E1159 Type 2 diabetes mellitus with other circulatory complications: Secondary | ICD-10-CM | POA: Diagnosis not present

## 2020-02-27 MED ORDER — OZEMPIC (1 MG/DOSE) 4 MG/3ML ~~LOC~~ SOPN
1.0000 mg | PEN_INJECTOR | SUBCUTANEOUS | 3 refills | Status: DC
Start: 2020-02-27 — End: 2020-02-27

## 2020-02-27 MED FILL — OZEMPIC (1 MG/DOSE) 4 MG/3M: 4 | 84 days supply | Qty: 9 | Fill #0

## 2020-02-27 NOTE — Patient Instructions (Addendum)
Please move: - Metformin ER 2000 mg with dinner  Increase: - Ozempic to 1 mg weekly  Decrease: - Lantus to 38 units at bedtime  Change: - Humalog (15 min before meals): 10-12 units before b'fast 10-15 units before lunch 10-15 units before dinner If sugars improve after increasing Ozempic, decrease Humalog doses further.  Please return in 2 months with your sugar log.   PATIENT INSTRUCTIONS FOR TYPE 2 DIABETES:  DIET AND EXERCISE Diet and exercise is an important part of diabetic treatment.  We recommended aerobic exercise in the form of brisk walking (working between 40-60% of maximal aerobic capacity, similar to brisk walking) for 150 minutes per week (such as 30 minutes five days per week) along with 3 times per week performing 'resistance' training (using various gauge rubber tubes with handles) 5-10 exercises involving the major muscle groups (upper body, lower body and core) performing 10-15 repetitions (or near fatigue) each exercise. Start at half the above goal but build slowly to reach the above goals. If limited by weight, joint pain, or disability, we recommend daily walking in a swimming pool with water up to waist to reduce pressure from joints while allow for adequate exercise.    BLOOD GLUCOSES Monitoring your blood glucoses is important for continued management of your diabetes. Please check your blood glucoses 2-4 times a day: fasting, before meals and at bedtime (you can rotate these measurements - e.g. one day check before the 3 meals, the next day check before 2 of the meals and before bedtime, etc.).   HYPOGLYCEMIA (low blood sugar) Hypoglycemia is usually a reaction to not eating, exercising, or taking too much insulin/ other diabetes drugs.  Symptoms include tremors, sweating, hunger, confusion, headache, etc. Treat IMMEDIATELY with 15 grams of Carbs: . 4 glucose tablets .  cup regular juice/soda . 2 tablespoons raisins . 4 teaspoons sugar . 1 tablespoon  honey Recheck blood glucose in 15 mins and repeat above if still symptomatic/blood glucose <100.  RECOMMENDATIONS TO REDUCE YOUR RISK OF DIABETIC COMPLICATIONS: * Take your prescribed MEDICATION(S) * Follow a DIABETIC diet: Complex carbs, fiber rich foods, (monounsaturated and polyunsaturated) fats * AVOID saturated/trans fats, high fat foods, >2,300 mg salt per day. * EXERCISE at least 5 times a week for 30 minutes or preferably daily.  * DO NOT SMOKE OR DRINK more than 1 drink a day. * Check your FEET every day. Do not wear tightfitting shoes. Contact us if you develop an ulcer * See your EYE doctor once a year or more if needed * Get a FLU shot once a year * Get a PNEUMONIA vaccine once before and once after age 66 years  GOALS:  * Your Hemoglobin A1c of <7%  * fasting sugars need to be <130 * after meals sugars need to be <180 (2h after you start eating) * Your Systolic BP should be 409 or lower  * Your Diastolic BP should be 80 or lower  * Your HDL (Good Cholesterol) should be 40 or higher  * Your LDL (Bad Cholesterol) should be 100 or lower. * Your Triglycerides should be 150 or lower  * Your Urine microalbumin (kidney function) should be <30 * Your Body Mass Index should be 25 or lower    Please consider the following ways to cut down carbs and fat and increase fiber and micronutrients in your diet: - substitute whole grain for white bread or pasta - substitute brown rice for white rice - substitute 90-calorie flat bread pieces  for slices of bread when possible - substitute sweet potatoes or yams for white potatoes - substitute humus for margarine - substitute tofu for cheese when possible - substitute almond or rice milk for regular milk (would not drink soy milk daily due to concern for soy estrogen influence on breast cancer risk) - substitute dark chocolate for other sweets when possible - substitute water - can add lemon or orange slices for taste - for diet sodas  (artificial sweeteners will trick your body that you can eat sweets without getting calories and will lead you to overeating and weight gain in the long run) - do not skip breakfast or other meals (this will slow down the metabolism and will result in more weight gain over time)  - can try smoothies made from fruit and almond/rice milk in am instead of regular breakfast - can also try old-fashioned (not instant) oatmeal made with almond/rice milk in am - order the dressing on the side when eating salad at a restaurant (pour less than half of the dressing on the salad) - eat as little meat as possible - can try juicing, but should not forget that juicing will get rid of the fiber, so would alternate with eating raw veg./fruits or drinking smoothies - use as little oil as possible, even when using olive oil - can dress a salad with a mix of balsamic vinegar and lemon juice, for e.g. - use agave nectar, stevia sugar, or regular sugar rather than artificial sweateners - steam or broil/roast veggies  - snack on veggies/fruit/nuts (unsalted, preferably) when possible, rather than processed foods - reduce or eliminate aspartame in diet (it is in diet sodas, chewing gum, etc) Read the labels!  Try to read Dr. Janene Harvey book: "Program for Reversing Diabetes" for other ideas for healthy eating.

## 2020-02-27 NOTE — Progress Notes (Addendum)
Patient ID: [name], male   DOB: 1965/07/21, 54 y.o.   MRN: [id number]   This visit occurred during the SARS-CoV-2 public health emergency.  Safety protocols were in place, including screening questions prior to the visit, additional usage of staff PPE, and extensive cleaning of exam room while observing appropriate contact time as indicated for disinfecting solutions.   HPI: [name] is a 54 y.o.-year-old male, referred by Abby Potash, PA-C, for management of DM2, dx in 1996-2000, insulin-dependent since ~2001, uncontrolled, with CV complications (had 3 coronary stents - last 2011).  Reviewed HbA1c: Lab Results  Component Value Date   HGBA1C 8.8 (H) 01/23/2020   HGBA1C 8.0 (H) 09/26/2019   HGBA1C 9.6 (H) 06/27/2019   HGBA1C 9.1 (H) 02/13/2019   HGBA1C 10.2 (H) 07/18/2018   HGBA1C 10.4 (H) 03/14/2018   HGBA1C 9.8 (A) 12/13/2017   HGBA1C 10.6 (H) 03/23/2017   HGBA1C 8.5 03/30/2009   Pt is on a regimen of: - Metformin ER 1000 mg 2x a day, with meals - Ozempic 0.5 mg weekly - in past 2 mo - Lantus 45 units at bedtime - Humalog 11-15 units 3x a day, before meals - inconsistently while working (3 days a week) He was on Novolog and Regular insulin. He has been on Trulicity >> severe fatigue, complete lack of appetite. Stopped 3 mo ago after a hospitalization. He tried [place] >> does not remember why he stopped.  Pt checks his sugars >4x a day [employer]) and they are: - am: 83-160, 200 - 2h after b'fast: n/c - before lunch: 40s, 60s-120 - 2h after lunch: n/c - before dinner: 100-225 (misses lunchtime Humalog) - 2h after dinner: n/c - bedtime: 150-250 - nighttime: n/c Lowest sugar was LO [employer]; he has hypoglycemia awareness at 24.  Highest sugar was 300s. He has a h/o DKA x3, last 2 years ago.  Pt's meals are: - Breakfast: cereal (Special K protein) + banana; 2 eggs 4 [place] links - Lunch: salads or soups - Dinner: meat + veggies (green)  - Snacks: does not have a  sweet tooth He exercises 1-2 times a week.  Of note, he is seen in the [facility] x 1 year.  - no CKD, last BUN/creatinine:  Lab Results  Component Value Date   BUN 16 01/23/2020   BUN 27 (H) 11/22/2019   CREATININE 0.83 01/23/2020   CREATININE 0.96 11/22/2019  On lisinopril 10. H/o kidney stones.  - + HL; last set of lipids: Lab Results  Component Value Date   CHOL 281 (H) 01/23/2020   HDL 41 01/23/2020   LDLCALC 200 (H) 01/23/2020   TRIG 208 (H) 01/23/2020   CHOLHDL 6.9 (H) 01/23/2020  On Crestor 40 - not consistently before the above labs.  - last eye exam was in fall 2020: No DR.   - no numbness and tingling in his feet.  On ASA 81.  Pt has FH of DM in sister - dx'ed 66 y/o.  He also has a history of HTN, obesity class I.  ROS: Constitutional: no weight gain, no weight loss, no fatigue, no subjective hyperthermia, no subjective hypothermia, no nocturia Eyes: no blurry vision, no xerophthalmia ENT: no sore throat, no nodules palpated in neck, no dysphagia, no odynophagia, no hoarseness, no tinnitus, no hypoacusis Cardiovascular: no CP, no SOB, no palpitations, no leg swelling Respiratory: no cough, no SOB, no wheezing Gastrointestinal: no N, no V, no D, no C, no acid reflux Musculoskeletal: no  muscle, no joint aches Skin: no rash, no hair loss Neurological: no tremors, no numbness or tingling/no dizziness/no HAs Psychiatric: no depression, no anxiety  Past Medical History:  Diagnosis Date  . CAD (coronary artery disease)    DES.  /  ... Later (10/2007) intervention for in-stent restenosis ;  EF 60%... catheterization... June, 2009  . Chest pain   . Diabetes mellitus type 2, uncontrolled ([place]) 09/27/2014  . Diabetes mellitus type II   . DKA (diabetic ketoacidoses) 12/2010; 03/23/2017  . Dyslipidemia   . Ejection fraction    EF 60%, catheterization, 2009  . Gallbladder problem   . History of heart attack   . History of kidney stones   . HIV  (human immunodeficiency virus infection) ([place]) dx'd ~ 2002/2003  . HLD (hyperlipidemia)   . HTN (hypertension)   . 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  . Nausea and vomiting 03/23/2017  . 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 RPR  reverted to negative  . Sinus tachycardia    mild at rest.... October, 2010  . Syphilitic hepatitis    Past Surgical History:  Procedure Laterality Date  . BIOPSY  12/28/2019   Procedure: BIOPSY;  Surgeon: Rogene Houston, MD;  [facility];  Service: Endoscopy;;  . CHOLECYSTECTOMY OPEN  07/2001  . COLONOSCOPY N/A 05/28/2017   Procedure: COLONOSCOPY;  Surgeon: Rogene Houston, MD;  [facility];  Service: Endoscopy;  Laterality: N/A;  200 - pt to prep in Endo  . CORONARY ANGIOPLASTY WITH STENT PLACEMENT  03/2006; 10/2007;   . ESOPHAGEAL DILATION N/A 12/28/2019   Procedure: ESOPHAGEAL DILATION;  Surgeon: Rogene Houston, MD;  [facility];  Service: Endoscopy;  Laterality: N/A;  . ESOPHAGOGASTRODUODENOSCOPY N/A 12/28/2019   Procedure: ESOPHAGOGASTRODUODENOSCOPY (EGD);  Surgeon: Rogene Houston, MD;  [facility];  Service: Endoscopy;  Laterality: N/A;  125, per Lelon Frohlich moved to 10:55 pt aware  . HERNIA REPAIR    . LAPAROSCOPIC INCISIONAL / UMBILICAL / VENTRAL HERNIA REPAIR  07/2001   UHR  . POLYPECTOMY  05/28/2017   Procedure: POLYPECTOMY;  Surgeon: Rogene Houston, MD;  [facility];  Service: Endoscopy;;  colon   Social History   Socioeconomic History  . Marital status: Single    Spouse name: Not on file  . Number of children: 0  . Years of education: Not on file  . Highest education level: Not on file  Occupational History  . 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 Smoking status: Never Smoker  . Smokeless tobacco: Never Used  Vaping Use  .  Vaping Use: Never used  Substance and Sexual Activity  . Alcohol use: Yes    Alcohol/week: 0.0 - 1.0 standard drinks    Comment: Social drinker, vodka, once a month  . Drug use: No  . Sexual activity: Not Currently    Partners: Male    Comment: offered condoms 02/2019  Other Topics Concern  . Not on file  Social History Narrative  . Not on file   Social Determinants of Health   Financial Resource Strain:   . Difficulty of Paying Living Expenses: Not on file  Food Insecurity:   . Worried About Charity fundraiser in the Last Year: Not on file  . Ran Out of Food in the Last Year: Not on file  Transportation Needs:   .  Lack of Transportation (Medical): Not on file  . Lack of Transportation (Non-Medical): Not on file  Physical Activity:   . Days of Exercise per Week: Not on file  . Minutes of Exercise per Session: Not on file  Stress:   . Feeling of Stress : Not on file  Social Connections:   . Frequency of Communication with Friends and Family: Not on file  . Frequency of Social Gatherings with Friends and Family: Not on file  . Attends Religious Services: Not on file  . Active Member of Clubs or Organizations: Not on file  . Attends Archivist Meetings: Not on file  . Marital Status: Not on file  Intimate Partner Violence:   . Fear of Current or Ex-Partner: Not on file  . Emotionally Abused: Not on file  . Physically Abused: Not on file  . Sexually Abused: Not on file   Current Outpatient Medications on File Prior to Visit  Medication Sig Dispense Refill  . aspirin 81 MG tablet Take 1 tablet (81 mg total) by mouth daily with breakfast. 30 tablet 3  . bictegravir-emtricitabine-tenofovir AF (BIKTARVY) 50-200-25 MG TABS tablet Take 1 tablet by mouth daily. 30 tablet 11  . chlorthalidone (HYGROTON) 25 MG tablet Take 0.5 tablets (12.5 mg total) by mouth daily. 45 tablet 3  . HUMALOG KWIKPEN 100 UNIT/ML KwikPen Inject 11-15 Units into the skin 3 (three) times daily.  Per sliding scale-patient monitored adding 1 unit after levels of 150    . insulin glargine (LANTUS SOLOSTAR) 100 UNIT/ML injection Inject 45 Units into the skin at bedtime.     [name] lisinopril (PRINIVIL,ZESTRIL) 10 MG tablet Take 1 tablet (10 mg total) by mouth daily. 90 tablet 3  . metFORMIN (GLUCOPHAGE XR) 500 MG 24 hr tablet Take 2 tablets (1,000 mg total) by mouth 2 (two) times daily. 120 tablet 0  . metoprolol succinate (TOPROL-XL) 100 MG 24 hr tablet Take 100 mg by mouth daily.     . Multiple Vitamins-Minerals (MULTIVITAMIN WITH MINERALS) tablet Take 1 tablet by mouth daily.    . pantoprazole (PROTONIX) 40 MG tablet Take 1 tablet (40 mg total) by mouth daily before breakfast. 90 tablet 1  . rosuvastatin (CRESTOR) 40 MG tablet Take 1 tablet (40 mg total) by mouth daily. 90 tablet 3  . Semaglutide,0.25 or 0.5MG /DOS, (OZEMPIC, 0.25 OR 0.5 MG/DOSE,) 2 MG/1.5ML SOPN Inject 0.5 mg into the skin once a week. 6 mL 0  . Vitamin D, Ergocalciferol, (DRISDOL) 1.25 MG (50000 UNIT) CAPS capsule Take 1 capsule (50,000 Units total) by mouth every 3 (three) days. 10 capsule 0   No current facility-administered medications on file prior to visit.   Allergies  Allergen Reactions  . Pepcid [Famotidine] Other (See Comments)    Contraindicated with ODEFSEY  . Prilosec [Omeprazole] Other (See Comments)    Contraindicated with RPV (lowers level of this ARV in ODEFSEY   Family History  Problem Relation Age of Onset  . Heart attack Father        7 heart attacks  . CAD Father   . Alcoholism Father   . Hyperlipidemia Mother   . Hypertension Mother   . Anemia Other   . Diabetes Other   . CAD Sister   . Diabetes Sister     PE: BP (!) 160/90   Pulse (!) 101   Ht 5\' 6"  (1.676 m)   Wt 198 lb (89.8 kg)   SpO2 95%   BMI 31.96 kg/m  Wt Readings  from Last 3 Encounters:  02/27/20 198 lb (89.8 kg)  02/13/20 192 lb (87.1 kg)  01/23/20 193 lb (87.5 kg)   Constitutional: overweight, in NAD Eyes: PERRLA,  EOMI, no exophthalmos ENT: moist mucous membranes, no thyromegaly, no cervical lymphadenopathy Cardiovascular: RRR, No MRG Respiratory: CTA B Gastrointestinal: abdomen soft, NT, ND, BS+ Musculoskeletal: no deformities, strength intact in all 4 Skin: moist, warm, no rashes Neurological: no tremor with outstretched hands, DTR normal in all 4  ASSESSMENT: 1. DM2, insulin-dependent, uncontrolled, with complications - CAD - s/p stents   PLAN:  1. Patient with long-standing, uncontrolled diabetes, on oral antidiabetic regimen, basal-bolus insulin regimen, and weekly GLP-1 receptor agonist, with still poor control.  Latest HbA1c was reviewed from 01/23/2020 and this was 8.8%. -At today's visit, we reviewed his blood sugars at home.  They are quite fluctuating in the morning, lower in the middle of the day if he is working that day and is active, and can be quite high before and after dinner especially if he misses his Humalog before lunch, which is happening when he is working.  He works 3 days a week.  We discussed about the importance of taking Humalog whenever he eats, ideally 15 minutes before every meal. -Since sugars are high in the morning we will move the entire dose of Metformin with dinner.  We will also increase the Ozempic to cover his meals better, while trying to decrease the Humalog with breakfast to avoid low blood sugars.  Also, to avoid lows (he had several 40s), will decrease the Lantus. -We also discussed about improving diet, which is high fat for now.  With an LDL cholesterol of 200 (however, he was inconsistent with Crestor at that time), I suggested a lower fat diet.  Discussed alternatives especially to breakfast but also given written instructions about improving diet.  Discussed about the concept of insulin resistance and how to improve it.  He may need a referral to nutrition at next visit. - I suggested to:  Patient Instructions  Please move: - Metformin ER 2000 mg with  dinner  Increase: - Ozempic to 1 mg weekly  Decrease: - Lantus to 38 units at bedtime  Change: - Humalog (15 min before meals): 10-12 units before b'fast 10-15 units before lunch 10-15 units before dinner If sugars improve after increasing Ozempic, decrease Humalog doses further.  Please return in 2 months with your sugar log.   - Strongly advised him to start checking sugars at different times of the day - check 3x a day, rotating checks - discussed about CBG targets for treatment: 80-130 mg/dL before meals and <180 mg/dL after meals; target HbA1c <7%. - given sugar log and advised how to fill it and to bring it at next appt  - given foot care handout and explained the principles  - given instructions for hypoglycemia management "15-15 rule"  - advised for yearly eye exams  - Return to clinic in 3 mo with sugar log   Philemon Kingdom, MD PhD [facility] Endocrinology

## 2020-02-29 ENCOUNTER — Telehealth: Payer: Self-pay | Admitting: Cardiology

## 2020-02-29 NOTE — Telephone Encounter (Signed)
[place] with me, thank ou for letting me know

## 2020-02-29 NOTE — Telephone Encounter (Signed)
Patient would like to switch from Dr. Meda Coffee to Dr. Domenic Polite, do to location.  Are you both in agreement?

## 2020-03-01 NOTE — Telephone Encounter (Signed)
Yes, that should be fine.

## 2020-03-05 ENCOUNTER — Inpatient Hospital Stay (HOSPITAL_COMMUNITY)
Admission: EM | Admit: 2020-03-05 | Discharge: 2020-03-06 | DRG: 638 | Disposition: A | Discharge status: Home / Self Care | Payer: 59 | Attending: Internal Medicine | Admitting: Internal Medicine

## 2020-03-05 ENCOUNTER — Observation Stay (HOSPITAL_COMMUNITY): Payer: 59

## 2020-03-05 ENCOUNTER — Other Ambulatory Visit: Payer: Self-pay

## 2020-03-05 ENCOUNTER — Encounter (HOSPITAL_COMMUNITY): Payer: Self-pay | Admitting: Emergency Medicine

## 2020-03-05 ENCOUNTER — Ambulatory Visit (INDEPENDENT_AMBULATORY_CARE_PROVIDER_SITE_OTHER): Payer: 59 | Admitting: Physician Assistant

## 2020-03-05 DIAGNOSIS — E7800 Pure hypercholesterolemia, unspecified: Secondary | ICD-10-CM | POA: Diagnosis present

## 2020-03-05 DIAGNOSIS — Z794 Long term (current) use of insulin: Secondary | ICD-10-CM | POA: Diagnosis not present

## 2020-03-05 DIAGNOSIS — I252 Old myocardial infarction: Secondary | ICD-10-CM | POA: Diagnosis not present

## 2020-03-05 DIAGNOSIS — E785 Hyperlipidemia, unspecified: Secondary | ICD-10-CM | POA: Diagnosis present

## 2020-03-05 DIAGNOSIS — R000 Tachycardia, unspecified: Secondary | ICD-10-CM | POA: Diagnosis not present

## 2020-03-05 DIAGNOSIS — Z7982 Long term (current) use of aspirin: Secondary | ICD-10-CM | POA: Diagnosis not present

## 2020-03-05 DIAGNOSIS — Z79899 Other long term (current) drug therapy: Secondary | ICD-10-CM

## 2020-03-05 DIAGNOSIS — E875 Hyperkalemia: Secondary | ICD-10-CM | POA: Diagnosis not present

## 2020-03-05 DIAGNOSIS — N179 Acute kidney failure, unspecified: Secondary | ICD-10-CM | POA: Diagnosis not present

## 2020-03-05 DIAGNOSIS — Z7984 Long term (current) use of oral hypoglycemic drugs: Secondary | ICD-10-CM | POA: Diagnosis not present

## 2020-03-05 DIAGNOSIS — I2510 Atherosclerotic heart disease of native coronary artery without angina pectoris: Secondary | ICD-10-CM | POA: Diagnosis present

## 2020-03-05 DIAGNOSIS — I10 Essential (primary) hypertension: Secondary | ICD-10-CM | POA: Diagnosis not present

## 2020-03-05 DIAGNOSIS — E872 Acidosis: Secondary | ICD-10-CM | POA: Diagnosis not present

## 2020-03-05 DIAGNOSIS — E860 Dehydration: Secondary | ICD-10-CM | POA: Diagnosis not present

## 2020-03-05 DIAGNOSIS — R739 Hyperglycemia, unspecified: Secondary | ICD-10-CM

## 2020-03-05 DIAGNOSIS — E871 Hypo-osmolality and hyponatremia: Secondary | ICD-10-CM

## 2020-03-05 DIAGNOSIS — Z20822 Contact with and (suspected) exposure to covid-19: Secondary | ICD-10-CM | POA: Diagnosis present

## 2020-03-05 DIAGNOSIS — Z21 Asymptomatic human immunodeficiency virus [HIV] infection status: Secondary | ICD-10-CM | POA: Diagnosis present

## 2020-03-05 DIAGNOSIS — Z888 Allergy status to other drugs, medicaments and biological substances status: Secondary | ICD-10-CM | POA: Diagnosis not present

## 2020-03-05 DIAGNOSIS — Z87442 Personal history of urinary calculi: Secondary | ICD-10-CM

## 2020-03-05 DIAGNOSIS — Z955 Presence of coronary angioplasty implant and graft: Secondary | ICD-10-CM

## 2020-03-05 DIAGNOSIS — R0689 Other abnormalities of breathing: Secondary | ICD-10-CM | POA: Diagnosis not present

## 2020-03-05 DIAGNOSIS — R0600 Dyspnea, unspecified: Secondary | ICD-10-CM | POA: Diagnosis not present

## 2020-03-05 DIAGNOSIS — E1165 Type 2 diabetes mellitus with hyperglycemia: Secondary | ICD-10-CM | POA: Diagnosis not present

## 2020-03-05 DIAGNOSIS — Z833 Family history of diabetes mellitus: Secondary | ICD-10-CM | POA: Diagnosis not present

## 2020-03-05 DIAGNOSIS — E1110 Type 2 diabetes mellitus with ketoacidosis without coma: Principal | ICD-10-CM | POA: Diagnosis present

## 2020-03-05 DIAGNOSIS — Z83438 Family history of other disorder of lipoprotein metabolism and other lipidemia: Secondary | ICD-10-CM

## 2020-03-05 DIAGNOSIS — R112 Nausea with vomiting, unspecified: Secondary | ICD-10-CM

## 2020-03-05 DIAGNOSIS — D72829 Elevated white blood cell count, unspecified: Secondary | ICD-10-CM | POA: Diagnosis present

## 2020-03-05 DIAGNOSIS — Z8249 Family history of ischemic heart disease and other diseases of the circulatory system: Secondary | ICD-10-CM | POA: Diagnosis not present

## 2020-03-05 DIAGNOSIS — E8729 Other acidosis: Secondary | ICD-10-CM

## 2020-03-05 DIAGNOSIS — E559 Vitamin D deficiency, unspecified: Secondary | ICD-10-CM

## 2020-03-05 DIAGNOSIS — I4510 Unspecified right bundle-branch block: Secondary | ICD-10-CM | POA: Diagnosis not present

## 2020-03-05 LAB — BASIC METABOLIC PANEL
Anion gap: 20 — ABNORMAL HIGH (ref 5–15)
Anion gap: 15 (ref 5–15)
Anion gap: 13 (ref 5–15)
BUN: 44 mg/dL — ABNORMAL HIGH (ref 6–20)
BUN: 43 mg/dL — ABNORMAL HIGH (ref 6–20)
BUN: 40 mg/dL — ABNORMAL HIGH (ref 6–20)
BUN: 38 mg/dL — ABNORMAL HIGH (ref 6–20)
BUN: 30 mg/dL — ABNORMAL HIGH (ref 6–20)
CO2: <7 mmol/L — ABNORMAL LOW (ref 22–32)
CO2: 7 mmol/L — ABNORMAL LOW (ref 22–32)
CO2: 17 mmol/L — ABNORMAL LOW (ref 22–32)
CO2: 23 mmol/L (ref 22–32)
CO2: 27 mmol/L (ref 22–32)
Calcium: 8.4 mg/dL — ABNORMAL LOW (ref 8.9–10.3)
Calcium: 8.8 mg/dL — ABNORMAL LOW (ref 8.9–10.3)
Calcium: 8.7 mg/dL — ABNORMAL LOW (ref 8.9–10.3)
Calcium: 8.5 mg/dL — ABNORMAL LOW (ref 8.9–10.3)
Calcium: 8 mg/dL — ABNORMAL LOW (ref 8.9–10.3)
Chloride: 93 mmol/L — ABNORMAL LOW (ref 98–111)
Chloride: 100 mmol/L (ref 98–111)
Chloride: 105 mmol/L (ref 98–111)
Chloride: 105 mmol/L (ref 98–111)
Chloride: 105 mmol/L (ref 98–111)
Creatinine, Ser: 2.94 mg/dL — ABNORMAL HIGH (ref 0.61–1.24)
Creatinine, Ser: 2.74 mg/dL — ABNORMAL HIGH (ref 0.61–1.24)
Creatinine, Ser: 2.08 mg/dL — ABNORMAL HIGH (ref 0.61–1.24)
Creatinine, Ser: 1.7 mg/dL — ABNORMAL HIGH (ref 0.61–1.24)
Creatinine, Ser: 1.2 mg/dL (ref 0.61–1.24)
GFR, Estimated: 23 mL/min — ABNORMAL LOW (ref >60)
GFR, Estimated: 25 mL/min — ABNORMAL LOW (ref >60)
GFR, Estimated: 35 mL/min — ABNORMAL LOW (ref >60)
GFR, Estimated: 45 mL/min — ABNORMAL LOW (ref >60)
GFR, Estimated: >60 mL/min (ref >60)
Glucose, Bld: 1088 mg/dL (ref 70–99)
Glucose, Bld: 704 mg/dL (ref 70–99)
Glucose, Bld: 294 mg/dL — ABNORMAL HIGH (ref 70–99)
Glucose, Bld: 182 mg/dL — ABNORMAL HIGH (ref 70–99)
Glucose, Bld: 158 mg/dL — ABNORMAL HIGH (ref 70–99)
Potassium: 6.6 mmol/L (ref 3.5–5.1)
Potassium: 4.4 mmol/L (ref 3.5–5.1)
Potassium: 3.5 mmol/L (ref 3.5–5.1)
Potassium: 3.3 mmol/L — ABNORMAL LOW (ref 3.5–5.1)
Potassium: 3.2 mmol/L — ABNORMAL LOW (ref 3.5–5.1)
Sodium: 132 mmol/L — ABNORMAL LOW (ref 135–145)
Sodium: 140 mmol/L (ref 135–145)
Sodium: 142 mmol/L (ref 135–145)
Sodium: 143 mmol/L (ref 135–145)
Sodium: 145 mmol/L (ref 135–145)

## 2020-03-05 LAB — RESPIRATORY PANEL BY RT PCR (FLU A&B, COVID)
Influenza A by PCR: NEGATIVE
Influenza B by PCR: NEGATIVE
SARS Coronavirus 2 by RT PCR: NEGATIVE

## 2020-03-05 LAB — CBG MONITORING, ED
Glucose-Capillary: >600 mg/dL (ref 70–99)
Glucose-Capillary: >600 mg/dL (ref 70–99)
Glucose-Capillary: >600 mg/dL (ref 70–99)
Glucose-Capillary: >600 mg/dL (ref 70–99)
Glucose-Capillary: >600 mg/dL (ref 70–99)
Glucose-Capillary: 482 mg/dL — ABNORMAL HIGH (ref 70–99)
Glucose-Capillary: 529 mg/dL (ref 70–99)
Glucose-Capillary: 347 mg/dL — ABNORMAL HIGH (ref 70–99)
Glucose-Capillary: 418 mg/dL — ABNORMAL HIGH (ref 70–99)
Glucose-Capillary: 248 mg/dL — ABNORMAL HIGH (ref 70–99)
Glucose-Capillary: 355 mg/dL — ABNORMAL HIGH (ref 70–99)
Glucose-Capillary: 190 mg/dL — ABNORMAL HIGH (ref 70–99)
Glucose-Capillary: 160 mg/dL — ABNORMAL HIGH (ref 70–99)
Glucose-Capillary: 180 mg/dL — ABNORMAL HIGH (ref 70–99)

## 2020-03-05 LAB — CBC WITH DIFFERENTIAL/PLATELET
Abs Immature Granulocytes: 0.58 10*3/uL — ABNORMAL HIGH (ref 0.00–0.07)
Basophils Absolute: 0.1 10*3/uL (ref 0.0–0.1)
Basophils Relative: 1 %
Eosinophils Absolute: 0 10*3/uL (ref 0.0–0.5)
Eosinophils Relative: 0 %
HCT: 48.3 % (ref 39.0–52.0)
Hemoglobin: 14.7 g/dL (ref 13.0–17.0)
Immature Granulocytes: 3 %
Lymphocytes Relative: 6 %
Lymphs Abs: 1.2 10*3/uL (ref 0.7–4.0)
MCH: 29.4 pg (ref 26.0–34.0)
MCHC: 30.4 g/dL (ref 30.0–36.0)
MCV: 96.6 fL (ref 80.0–100.0)
Monocytes Absolute: 1.3 10*3/uL — ABNORMAL HIGH (ref 0.1–1.0)
Monocytes Relative: 7 %
Neutro Abs: 16.5 10*3/uL — ABNORMAL HIGH (ref 1.7–7.7)
Neutrophils Relative %: 83 %
Platelets: 388 10*3/uL (ref 150–400)
RBC: 5 MIL/uL (ref 4.22–5.81)
RDW: 13 % (ref 11.5–15.5)
WBC: 19.7 10*3/uL — ABNORMAL HIGH (ref 4.0–10.5)
nRBC: 0 % (ref 0.0–0.2)

## 2020-03-05 LAB — BLOOD GAS, VENOUS
Acid-base deficit: 26.8 mmol/L — ABNORMAL HIGH (ref 0.0–2.0)
Bicarbonate: 6 mmol/L — ABNORMAL LOW (ref 20.0–28.0)
Drawn by: 1528
FIO2: 100
O2 Saturation: 95.1 %
Patient temperature: 37
pCO2, Ven: 11.9 mmHg — CL (ref 44.0–60.0)
pH, Ven: 6.99 — CL (ref 7.250–7.430)
pO2, Ven: 123 mmHg — ABNORMAL HIGH (ref 32.0–45.0)

## 2020-03-05 LAB — CBC
HCT: 45.1 % (ref 39.0–52.0)
Hemoglobin: 14.3 g/dL (ref 13.0–17.0)
MCH: 29.3 pg (ref 26.0–34.0)
MCHC: 31.7 g/dL (ref 30.0–36.0)
MCV: 92.4 fL (ref 80.0–100.0)
Platelets: 307 10*3/uL (ref 150–400)
RBC: 4.88 MIL/uL (ref 4.22–5.81)
RDW: 12.8 % (ref 11.5–15.5)
WBC: 23.8 10*3/uL — ABNORMAL HIGH (ref 4.0–10.5)
nRBC: 0 % (ref 0.0–0.2)

## 2020-03-05 LAB — PROCALCITONIN: Procalcitonin: 3.99 ng/mL

## 2020-03-05 LAB — GLUCOSE, CAPILLARY
Glucose-Capillary: 152 mg/dL — ABNORMAL HIGH (ref 70–99)
Glucose-Capillary: 172 mg/dL — ABNORMAL HIGH (ref 70–99)
Glucose-Capillary: 150 mg/dL — ABNORMAL HIGH (ref 70–99)
Glucose-Capillary: 139 mg/dL — ABNORMAL HIGH (ref 70–99)

## 2020-03-05 LAB — BETA-HYDROXYBUTYRIC ACID
Beta-Hydroxybutyric Acid: >8 mmol/L — ABNORMAL HIGH (ref 0.05–0.27)
Beta-Hydroxybutyric Acid: 5.64 mmol/L — ABNORMAL HIGH (ref 0.05–0.27)

## 2020-03-05 LAB — MRSA PCR SCREENING: MRSA by PCR: NEGATIVE

## 2020-03-05 MED ORDER — METOPROLOL TARTRATE 5 MG/5ML IV SOLN
2.5000 mg | Freq: Four times a day (QID) | INTRAVENOUS | Status: DC
  Administered 2020-03-05 – 2020-03-06 (×4): 2.5 mg via INTRAVENOUS
  Filled 2020-03-05 (×4): qty 5

## 2020-03-05 MED ORDER — SODIUM BICARBONATE 8.4 % IV SOLN
INTRAVENOUS | Status: AC
  Filled 2020-03-05: qty 150

## 2020-03-05 MED ORDER — INSULIN REGULAR(HUMAN) IN NACL 100-0.9 UT/100ML-% IV SOLN
INTRAVENOUS | Status: DC
  Administered 2020-03-05: 11.5 [IU]/h via INTRAVENOUS
  Filled 2020-03-05 (×2): qty 100

## 2020-03-05 MED ORDER — DEXTROSE IN LACTATED RINGERS 5 % IV SOLN: INTRAVENOUS | Status: DC

## 2020-03-05 MED ORDER — ORAL CARE MOUTH RINSE
15.0000 mL | Freq: Two times a day (BID) | OROMUCOSAL | Status: DC
  Administered 2020-03-05 – 2020-03-06 (×2): 15 mL via OROMUCOSAL

## 2020-03-05 MED ORDER — INSULIN REGULAR(HUMAN) IN NACL 100-0.9 UT/100ML-% IV SOLN: INTRAVENOUS | Status: DC

## 2020-03-05 MED ORDER — INSULIN ASPART 100 UNIT/ML ~~LOC~~ SOLN
0.0000 [IU] | Freq: Three times a day (TID) | SUBCUTANEOUS | Status: DC
  Administered 2020-03-06: 5 [IU] via SUBCUTANEOUS
  Administered 2020-03-06: 11 [IU] via SUBCUTANEOUS

## 2020-03-05 MED ORDER — PROCHLORPERAZINE EDISYLATE 10 MG/2ML IJ SOLN
10.0000 mg | Freq: Four times a day (QID) | INTRAMUSCULAR | Status: AC | PRN
  Administered 2020-03-05: 10 mg via INTRAVENOUS
  Filled 2020-03-05: qty 2

## 2020-03-05 MED ORDER — SODIUM BICARBONATE 8.4 % IV SOLN: 50.0000 meq | Freq: Once | INTRAVENOUS | Status: AC

## 2020-03-05 MED ORDER — LACTATED RINGERS IV BOLUS
1000.0000 mL | Freq: Once | INTRAVENOUS | Status: AC
  Administered 2020-03-05: 1000 mL via INTRAVENOUS

## 2020-03-05 MED ORDER — CHLORHEXIDINE GLUCONATE CLOTH 2 % EX PADS
6.0000 | MEDICATED_PAD | Freq: Every day | CUTANEOUS | Status: DC
  Administered 2020-03-05: 6 via TOPICAL

## 2020-03-05 MED ORDER — LACTATED RINGERS IV SOLN: INTRAVENOUS | Status: DC

## 2020-03-05 MED ORDER — METOPROLOL TARTRATE 5 MG/5ML IV SOLN: 2.5000 mg | Freq: Four times a day (QID) | INTRAVENOUS | Status: DC

## 2020-03-05 MED ORDER — SODIUM BICARBONATE 8.4 % IV SOLN
INTRAVENOUS | Status: AC
  Administered 2020-03-05: 50 meq via INTRAVENOUS
  Filled 2020-03-05: qty 50

## 2020-03-05 MED ORDER — INSULIN ASPART 100 UNIT/ML ~~LOC~~ SOLN: 0.0000 [IU] | Freq: Every day | SUBCUTANEOUS | Status: DC

## 2020-03-05 MED ORDER — LACTATED RINGERS IV BOLUS
20.0000 mL/kg | Freq: Once | INTRAVENOUS | Status: AC
  Administered 2020-03-05: 1796 mL via INTRAVENOUS

## 2020-03-05 MED ORDER — ONDANSETRON HCL 4 MG/2ML IJ SOLN
4.0000 mg | Freq: Four times a day (QID) | INTRAMUSCULAR | Status: DC | PRN
  Administered 2020-03-05 (×2): 4 mg via INTRAVENOUS
  Filled 2020-03-05 (×2): qty 2

## 2020-03-05 MED ORDER — DEXTROSE 50 % IV SOLN: 0.0000 mL | INTRAVENOUS | Status: DC | PRN

## 2020-03-05 MED ORDER — POTASSIUM CHLORIDE 2 MEQ/ML IV SOLN
INTRAVENOUS | Status: DC
  Filled 2020-03-05 (×2): qty 1000

## 2020-03-05 MED ORDER — CALCIUM GLUCONATE-NACL 1-0.675 GM/50ML-% IV SOLN
1.0000 g | Freq: Once | INTRAVENOUS | Status: AC
  Administered 2020-03-05: 1000 mg via INTRAVENOUS
  Filled 2020-03-05: qty 50

## 2020-03-05 MED ORDER — INSULIN GLARGINE 100 UNIT/ML ~~LOC~~ SOLN
18.0000 [IU] | Freq: Every day | SUBCUTANEOUS | Status: DC
  Administered 2020-03-05: 18 [IU] via SUBCUTANEOUS
  Filled 2020-03-05: qty 0.18

## 2020-03-05 MED ORDER — STERILE WATER FOR INJECTION IV SOLN
INTRAVENOUS | Status: DC
  Filled 2020-03-05 (×6): qty 850

## 2020-03-05 MED ORDER — ENOXAPARIN SODIUM 40 MG/0.4ML ~~LOC~~ SOLN
40.0000 mg | SUBCUTANEOUS | Status: DC
  Administered 2020-03-05: 40 mg via SUBCUTANEOUS
  Filled 2020-03-05: qty 0.4

## 2020-03-05 NOTE — ED Notes (Signed)
Called [facility] for transport to [facility].

## 2020-03-05 NOTE — ED Provider Notes (Signed)
[facility] EMERGENCY DEPARTMENT Provider Note   CSN: [id number] Arrival date & time: 03/05/20  0444     History Chief Complaint  Patient presents with  . Hyperglycemia    [name] is a 54 y.o. male.   Hyperglycemia Blood sugar level PTA:  >500 Severity:  Moderate Onset quality:  Gradual Duration:  16 hours Timing:  Constant Progression:  Worsening Chronicity:  Recurrent Diabetes status:  Controlled with insulin Relieved by:  None tried Ineffective treatments:  None tried Associated symptoms: abdominal pain, nausea and shortness of breath   Associated symptoms: no confusion and no dehydration        Past Medical History:  Diagnosis Date  . CAD (coronary artery disease)    DES.  /  ... Later (10/2007) intervention for in-stent restenosis ;  EF 60%... catheterization... June, 2009  . Chest pain   . Diabetes mellitus type 2, uncontrolled ([place]) 09/27/2014  . Diabetes mellitus type II   . DKA (diabetic ketoacidoses) 12/2010; 03/23/2017  . Dyslipidemia   . Ejection fraction    EF 60%, catheterization, 2009  . Gallbladder problem   . History of heart attack   . History of kidney stones   . HIV (human immunodeficiency virus infection) ([place]) dx'd ~ 2002/2003  . HLD (hyperlipidemia)   . HTN (hypertension)   . 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  . Nausea and vomiting 03/23/2017  . 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 RPR  reverted to negative  . Sinus tachycardia    mild at rest.... October, 2010  . Syphilitic hepatitis     Patient Active Problem List   Diagnosis Date Noted  . Other fatigue 03/14/2018  . Shortness of breath on exertion 03/14/2018  . Uncontrolled type 2 diabetes mellitus with hyperglycemia ([place]) 02/08/2018  . Mixed hyperlipidemia 02/08/2018  . AKI (acute kidney injury) ([place]) 03/23/2017  . Special screening for malignant neoplasms,  colon 11/18/2016  . Family history of colon cancer 11/18/2016  . Lung nodule, multiple 09/27/2014  . Poorly controlled type 2 diabetes mellitus with circulatory disorder ([place]) 09/27/2014  . Early syphilis, 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  . Hypercholesterolemia 01/25/2013  . Ejection fraction   . Essential hypertension, benign   . Human immunodeficiency virus (HIV) disease ([place]) 09/11/2011  . Kidney stone on left side 01/04/2011  . DKA (diabetic ketoacidoses) 01/04/2011  . Coronary artery disease due to lipid rich plaque   . Secondary syphilis   . LFTs abnormal     Past Surgical History:  Procedure Laterality Date  . BIOPSY  12/28/2019   Procedure: BIOPSY;  Surgeon: Rogene Houston, MD;  Location: [facility] END[facility];  Service: Endoscopy;;  . CHOLECYSTECTOMY OPEN  07/2001  . COLONOSCOPY N/A 05/28/2017   Procedure: COLONOSCOPY;  Surgeon: Rogene Houston, MD;  Location: [facility] ENDO SUITE;  Service: Endoscopy;  Laterality: N/A;  200 - pt to prep in Endo  . CORONARY ANGIOPLASTY WITH STENT PLACEMENT  03/2006; 10/2007;   . ESOPHAGEAL DILATION N/A 12/28/2019   Procedure: ESOPHAGEAL DILATION;  Surgeon: Rogene Houston, MD;  Location: [facility] END[facility] SU[facility];  Service: Endoscopy;  Laterality: N/A;  . ESOPHAGOGASTRODUODENOSCOPY N/A 12/28/2019   Procedure: ESOPHAGOGASTRODUODENOSCOPY (EGD);  Surgeon: Rogene Houston, MD;  Location: [facility] END[facility] SU[facility];  Service: Endoscopy;  Laterality: N/A;  125, per Lelon Frohlich moved to 10:55 pt aware  . HERNIA REPAIR    . LAPAROSCOPIC INCISIONAL / UMBILICAL / VENTRAL HERNIA  REPAIR  07/2001   UHR  . POLYPECTOMY  05/28/2017   Procedure: POLYPECTOMY;  Surgeon: Rogene Houston, MD;  Location: [facility];  Service: Endoscopy;;  colon       Family History  Problem Relation Age of Onset  . Heart attack Father        7 heart attacks  . CAD Father   . Alcoholism Father   . Hyperlipidemia Mother   . Hypertension Mother   . Anemia Other   . Diabetes Other   . CAD Sister   . Diabetes Sister      Social History   Tobacco Use  . Smoking status: Never Smoker  . Smokeless tobacco: Never Used  Vaping Use  . Vaping Use: Never used  Substance Use Topics  . Alcohol use: Yes    Alcohol/week: 0.0 - 1.0 standard drinks    Comment: Social drinker, vodka, once a month  . Drug use: No    Home Medications Prior to Admission medications   Medication Sig Start Date End Date Taking? Authorizing Provider  aspirin 81 MG tablet Take 1 tablet (81 mg total) by mouth daily with breakfast. 12/31/19   Rehman, Mechele Dawley, MD  bictegravir-emtricitabine-tenofovir AF (BIKTARVY) 50-200-25 MG TABS tablet Take 1 tablet by mouth daily. 12/29/19   Kuppelweiser, Cassie L, RPH-CPP  chlorthalidone (HYGROTON) 25 MG tablet Take 0.5 tablets (12.5 mg total) by mouth daily. 11/22/19   Roxan Hockey, MD  HUMALOG KWIKPEN 100 UNIT/ML KwikPen Inject 11-15 Units into the skin 3 (three) times daily. Per sliding scale-patient monitored adding 1 unit after levels of 150 12/13/18   [provider]  insulin glargine (LANTUS SOLOSTAR) 100 UNIT/ML injection Inject 38 Units into the skin at bedtime.    [provider]  lisinopril (PRINIVIL,ZESTRIL) 10 MG tablet Take 1 tablet (10 mg total) by mouth daily. 07/19/18   Lyda Jester M, PA-C  metFORMIN (GLUCOPHAGE XR) 500 MG 24 hr tablet Take 2 tablets (1,000 mg total) by mouth 2 (two) times daily. 02/20/20   [name] Lesch A, DO  metoprolol succinate (TOPROL-XL) 100 MG 24 hr tablet Take 100 mg by mouth daily.  10/18/18   [provider]  Multiple Vitamins-Minerals (MULTIVITAMIN WITH MINERALS) tablet Take 1 tablet by mouth daily.    [provider]  pantoprazole (PROTONIX) 40 MG tablet Take 1 tablet (40 mg total) by mouth daily before breakfast. 12/28/19   Rehman, Mechele Dawley, MD  rosuvastatin (CRESTOR) 40 MG tablet Take 1 tablet (40 mg total) by mouth daily. 07/19/18   Lyda Jester M, PA-C  Semaglutide, 1 MG/DOSE, (OZEMPIC, 1 MG/DOSE,) 4 MG/3ML SOPN  Inject 1 mg into the skin once a week. 02/27/20   Philemon Kingdom, MD  Vitamin D, Ergocalciferol, (DRISDOL) 1.25 MG (50000 UNIT) CAPS capsule Take 1 capsule (50,000 Units total) by mouth every 3 (three) days. 02/13/20   [name] Lesch A, DO    Allergies    Pepcid [famotidine] and Prilosec [omeprazole]  Review of Systems   Review of Systems  Respiratory: Positive for shortness of breath.   Gastrointestinal: Positive for abdominal pain and nausea.  Psychiatric/Behavioral: Negative for confusion.  All other systems reviewed and are negative.   Physical Exam Updated Vital Signs BP (!) 136/54   Pulse (!) 135   Temp 98.5 F (36.9 C) (Oral)   Resp (!) 45   Ht 5\' 6"  (1.676 m)   Wt 89.8 kg   SpO2 98%   BMI 31.96 kg/m   Physical Exam  Vitals and nursing note reviewed.  Constitutional:      General: He is in acute distress.     Appearance: He is well-developed. He is ill-appearing.  HENT:     Head: Normocephalic and atraumatic.     Mouth/Throat:     Mouth: Mucous membranes are moist.     Pharynx: Oropharynx is clear.  Eyes:     Pupils: Pupils are equal, round, and reactive to light.  Cardiovascular:     Rate and Rhythm: Tachycardia present.  Pulmonary:     Effort: Tachypnea present. No respiratory distress.  Abdominal:     General: There is no distension.  Musculoskeletal:        General: Normal range of motion.     Cervical back: Normal range of motion.  Neurological:     General: No focal deficit present.     Mental Status: He is alert and oriented to person, place, and time.     ED Results / Procedures / Treatments   Labs (all labs ordered are listed, but only abnormal results are displayed) Labs Reviewed  BASIC METABOLIC PANEL - Abnormal; Notable for the following components:      Result Value   Sodium 132 (*)    Potassium 6.6 (*)    Chloride 93 (*)    CO2 <7 (*)    Glucose, Bld 1,088 (*)    BUN 44 (*)    Creatinine, Ser 2.94 (*)    Calcium 8.4 (*)    GFR,  Estimated 23 (*)    All other components within normal limits  BETA-HYDROXYBUTYRIC ACID - Abnormal; Notable for the following components:   Beta-Hydroxybutyric Acid >8.00 (*)    All other components within normal limits  CBC WITH DIFFERENTIAL/PLATELET - Abnormal; Notable for the following components:   WBC 19.7 (*)    Neutro Abs 16.5 (*)    Monocytes Absolute 1.3 (*)    Abs Immature Granulocytes 0.58 (*)    All other components within normal limits  BLOOD GAS, VENOUS - Abnormal; Notable for the following components:   pH, Ven 6.990 (*)    pCO2, Ven 11.9 (*)    pO2, Ven 123.0 (*)    Bicarbonate 6.0 (*)    Acid-base deficit 26.8 (*)    All other components within normal limits  CBG MONITORING, ED - Abnormal; Notable for the following components:   Glucose-Capillary >600 (*)    All other components within normal limits  RESPIRATORY PANEL BY RT PCR (FLU A&B, COVID)  BASIC METABOLIC PANEL  BASIC METABOLIC PANEL  BASIC METABOLIC PANEL  BASIC METABOLIC PANEL  BETA-HYDROXYBUTYRIC ACID  BETA-HYDROXYBUTYRIC ACID  URINALYSIS, ROUTINE W REFLEX MICROSCOPIC  CBG MONITORING, ED    EKG EKG Interpretation  Date/Time:  Tuesday March 05 2020 04:53:10 EDT Ventricular Rate:  133 PR Interval:    QRS Duration: 106 QT Interval:  323 QTC Calculation: 481 R Axis:   -65 Text Interpretation: Sinus tachycardia Ventricular premature complex Inferior infarct, old Consider anterior infarct Baseline wander in lead(s) V1 Confirmed by Merrily Pew [phone]) on 03/05/2020 5:45:58 AM   Radiology No results found.  Procedures .Critical Care Performed by: Merrily Pew, MD Authorized by: Merrily Pew, MD   Critical care provider statement:    Critical care time (minutes):  45   Critical care was necessary to treat or prevent imminent or life-threatening deterioration of the following conditions:  Endocrine crisis   Critical care was time spent personally by me on the following activities:  Discussions with consultants, evaluation of patient's response to treatment, examination of patient, ordering and performing treatments and interventions, ordering and review of laboratory studies, ordering and review of radiographic studies, pulse oximetry, re-evaluation of patient's condition, obtaining history from patient or surrogate and review of old charts   (including critical care time)  Medications Ordered in ED Medications  insulin regular, human (MYXREDLIN) 100 units/ 100 mL infusion (11.5 Units/hr Intravenous New Bag/Given 03/05/20 0552)  lactated ringers infusion (has no administration in time range)  dextrose 5 % in lactated ringers infusion (0 mLs Intravenous Hold 03/05/20 0552)  dextrose 50 % solution 0-50 mL (has no administration in time range)  lactated ringers bolus 1,000 mL (has no administration in time range)  calcium gluconate 1 g/ 50 mL sodium chloride IVPB (has no administration in time range)  sodium bicarbonate injection 50 mEq (has no administration in time range)  sodium bicarbonate 150 mEq in sterile water 1,000 mL infusion (has no administration in time range)  lactated ringers bolus 1,000 mL (0 mLs Intravenous Stopped 03/05/20 0553)  lactated ringers bolus 1,796 mL (1,796 mLs Intravenous New Bag/Given 03/05/20 0504)    ED Course  I have reviewed the triage vital signs and the nursing notes.  Pertinent labs & imaging results that were available during my care of the patient were reviewed by me and considered in my medical decision making (see chart for details).    MDM Rules/Calculators/A&P                         Hyperglycemia with tachycardia and tachypnea. Possibly DKA. Will eval for same. Unclear etiology.  Discussed with hospitalist who recommends bicarb infusion and calcium. Added on.   Final Clinical Impression(s) / ED Diagnoses Final diagnoses:  Diabetic ketoacidosis without coma associated with type 2 diabetes mellitus [facility])    Rx / DC  Orders ED Discharge Orders    None       Daliana Leverett, Corene Cornea, MD 03/05/20 [phone]

## 2020-03-05 NOTE — ED Notes (Signed)
CRITICAL VALUE ALERT  Critical Value:  Venous Ph 6.99 Venous CO2 11.9  Date & Time Notied:  03/05/2020 0104  Provider Notified: Dr. Dayna Barker  Orders Received/Actions taken: see chart

## 2020-03-05 NOTE — ED Notes (Signed)
CRITICAL VALUE ALERT  Critical Value:  K+ 6.6 Glucose 1088  Date & Time Notied:  03/05/2020 0534  Provider Notified: Dr. Dayna Barker  Orders Received/Actions taken: see chart

## 2020-03-05 NOTE — H&P (Signed)
History and Physical  [name]:[id number] DOB: Sep 06, 1965 DOA: 03/05/2020  Referring physician: Merrily Pew, MD PCP: Asencion Noble, MD  Outpatient specialists: Philemon Kingdom, MD (endocrinologist) Patient coming from: Home  Chief Complaint: Elevated blood sugar level  HPI: [name] is a 54 y.o. male with medical history significant for type 2 diabetes mellitus, hypertension, hyperlipidemia who presents to the emergency department accompanied by mother due to worsening generalized weakness which started yesterday.  History was obtained from patient and mother.  Per mother at bedside, patient was with her yesterday in the morning and patient states that he was tired and needed to return home, she checked on him in the evening and noted that patient was having increased work of breathing, so she insisted that patient should go home with her, on getting to mother's house, patient's condition worsened with increased thirst, and urinary frequency.  He states that he checked his blood glucose at home and his glucose monitor kept reading "low".  He states that he has been compliant with his medication, except for today due to feeling sick.  He complained of several episodes of nonbilious, nonbloody vomitus and denies fever, chills, headache, chest pain and diarrhea.  Patient denies any recent change in medication.  ED Course:  In the emergency department, he was tachypneic and tachycardic, but other vital signs were within normal range.  Work-up in the ED showed leukocytosis, hyponatremia, hyperkalemia (6.6), BUN to creatinine 44/2.94 (baseline creatinine at 0.8-1.2), and hyperglycemia (blood glucose level 1088), beta hydroxybutyrate > 8.0.  Calcium gluconate was given, IV bicarbonate was started and patient was started on DKA protocol.  Hospitalist was asked to admit patient for further evaluation and management.  Review of Systems: Constitutional: Negative for chills and fever.  HENT:  Negative for ear pain and sore throat.   Eyes: Negative for pain and visual disturbance.  Respiratory: Positive for increased work of breathing and shortness of breath.   Cardiovascular: Negative for chest pain and palpitations.  Gastrointestinal: Positive for nausea, vomiting and abdominal pain Endocrine: Positive for increased thirst and frequent urination.    Genitourinary: Negative for decreased urine volume, dysuria Musculoskeletal: Negative for arthralgias and back pain.  Skin: Negative for color change and rash.  Allergic/Immunologic: Negative for immunocompromised state.  Neurological: Negative for tremors, syncope, speech difficulty, weakness, light-headedness and headaches.  Hematological: Does not bruise/bleed easily.  All other systems reviewed and are negative   Past Medical History:  Diagnosis Date  . CAD (coronary artery disease)    DES.  /  ... Later (10/2007) intervention for in-stent restenosis ;  EF 60%... catheterization... June, 2009  . Chest pain   . Diabetes mellitus type 2, uncontrolled ([place]) 09/27/2014  . Diabetes mellitus type II   . DKA (diabetic ketoacidoses) 12/2010; 03/23/2017  . Dyslipidemia   . Ejection fraction    EF 60%, catheterization, 2009  . Gallbladder problem   . History of heart attack   . History of kidney stones   . HIV (human immunodeficiency virus infection) ([place]) dx'd ~ 2002/2003  . HLD (hyperlipidemia)   . HTN (hypertension)   . 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  . Nausea and vomiting 03/23/2017  . 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 RPR  reverted to negative  . Sinus tachycardia    mild at rest.... October, 2010  . Syphilitic hepatitis    Past Surgical History:  Procedure Laterality Date  .  BIOPSY  12/28/2019   Procedure: BIOPSY;  Surgeon: Rogene Houston, MD;  [facility];  Service: Endoscopy;;  .  CHOLECYSTECTOMY OPEN  07/2001  . COLONOSCOPY N/A 05/28/2017   Procedure: COLONOSCOPY;  Surgeon: Rogene Houston, MD;  Location: [facility];  Service: Endoscopy;  Laterality: N/A;  200 - pt to prep in Endo  . CORONARY ANGIOPLASTY WITH STENT PLACEMENT  03/2006; 10/2007;   . ESOPHAGEAL DILATION N/A 12/28/2019   Procedure: ESOPHAGEAL DILATION;  Surgeon: Rogene Houston, MD;  [facility];  Service: Endoscopy;  Laterality: N/A;  . ESOPHAGOGASTRODUODENOSCOPY N/A 12/28/2019   Procedure: ESOPHAGOGASTRODUODENOSCOPY (EGD);  Surgeon: Rogene Houston, MD;  [facility];  Service: Endoscopy;  Laterality: N/A;  125, per Lelon Frohlich moved to 10:55 pt aware  . HERNIA REPAIR    . LAPAROSCOPIC INCISIONAL / UMBILICAL / VENTRAL HERNIA REPAIR  07/2001   UHR  . POLYPECTOMY  05/28/2017   Procedure: POLYPECTOMY;  Surgeon: Rogene Houston, MD;  [facility];  Service: Endoscopy;;  colon    Social History:  reports that he has never smoked. He has never used smokeless tobacco. He reports current alcohol use. He reports that he does not use drugs.   Allergies  Allergen Reactions  . Pepcid [Famotidine] Other (See Comments)    Contraindicated with ODEFSEY  . Prilosec [Omeprazole] Other (See Comments)    Contraindicated with RPV (lowers level of this ARV in ODEFSEY    Family History  Problem Relation Age of Onset  . Heart attack Father        7 heart attacks  . CAD Father   . Alcoholism Father   . Hyperlipidemia Mother   . Hypertension Mother   . Anemia Other   . Diabetes Other   . CAD Sister   . Diabetes Sister     Prior to Admission medications   Medication Sig Start Date End Date Taking? Authorizing Provider  aspirin 81 MG tablet Take 1 tablet (81 mg total) by mouth daily with breakfast. 12/31/19   Rehman, Mechele Dawley, MD  bictegravir-emtricitabine-tenofovir AF (BIKTARVY) 50-200-25 MG TABS tablet Take 1 tablet by mouth daily. 12/29/19   Kuppelweiser, Cassie L, RPH-CPP  chlorthalidone  (HYGROTON) 25 MG tablet Take 0.5 tablets (12.5 mg total) by mouth daily. 11/22/19   Roxan Hockey, MD  HUMALOG KWIKPEN 100 UNIT/ML KwikPen Inject 11-15 Units into the skin 3 (three) times daily. Per sliding scale-patient monitored adding 1 unit after levels of 150 12/13/18   [provider]  insulin glargine (LANTUS SOLOSTAR) 100 UNIT/ML injection Inject 38 Units into the skin at bedtime.    [provider]  lisinopril (PRINIVIL,ZESTRIL) 10 MG tablet Take 1 tablet (10 mg total) by mouth daily. 07/19/18   Lyda Jester M, PA-C  metFORMIN (GLUCOPHAGE XR) 500 MG 24 hr tablet Take 2 tablets (1,000 mg total) by mouth 2 (two) times daily. 02/20/20   [name] Lesch A, DO  metoprolol succinate (TOPROL-XL) 100 MG 24 hr tablet Take 100 mg by mouth daily.  10/18/18   [provider]  Multiple Vitamins-Minerals (MULTIVITAMIN WITH MINERALS) tablet Take 1 tablet by mouth daily.    [provider]  pantoprazole (PROTONIX) 40 MG tablet Take 1 tablet (40 mg total) by mouth daily before breakfast. 12/28/19   Rehman, Mechele Dawley, MD  rosuvastatin (CRESTOR) 40 MG tablet Take 1 tablet (40 mg total) by mouth daily. 07/19/18   Lyda Jester M, PA-C  Semaglutide, 1 MG/DOSE, (OZEMPIC,  1 MG/DOSE,) 4 MG/3ML SOPN Inject 1 mg into the skin once a week. 02/27/20   Philemon Kingdom, MD  Vitamin D, Ergocalciferol, (DRISDOL) 1.25 MG (50000 UNIT) CAPS capsule Take 1 capsule (50,000 Units total) by mouth every 3 (three) days. 02/13/20   Georgia Lopes, DO    Physical Exam: BP (!) 117/58   Pulse (!) 138   Temp 98.5 F (36.9 C) (Oral)   Resp (!) 37   Ht 5\' 6"  (1.676 m)   Wt 89.8 kg   SpO2 100%   BMI 31.96 kg/m   . General: 54 y.o. year-old male ill appearing and in acute distress.  Alert and oriented x3. [name] HEENT: Dry mucous membrane.  NCAT, EOMI . Neck: Supple, trachea media . Cardiovascular: Tachycardia.  Regular rate and rhythm with no rubs or gallops.  No thyromegaly or JVD noted.  No  lower extremity edema. 2/4 pulses in all 4 extremities. [name] Respiratory: Tachypnea.  Clear to auscultation with no wheezes or rales.  . Abdomen: Soft nontender nondistended with normal bowel sounds x4 quadrants. . Muskuloskeletal: No cyanosis, clubbing or edema noted bilaterally . Neuro: CN II-XII intact, strength, sensation, reflexes intact . Skin: No ulcerative lesions noted or rashes . Psychiatry: Judgement and insight appear normal. Mood is appropriate for condition and setting          Labs on Admission:  Basic Metabolic Panel: Recent Labs  Lab 03/05/20 0457  NA 132*  K 6.6*  CL 93*  CO2 <7*  GLUCOSE 1,088*  BUN 44*  CREATININE 2.94*  CALCIUM 8.4*   Liver Function Tests: No results for input(s): AST, ALT, ALKPHOS, BILITOT, PROT, ALBUMIN in the last 168 hours. No results for input(s): LIPASE, AMYLASE in the last 168 hours. No results for input(s): AMMONIA in the last 168 hours. CBC: Recent Labs  Lab 03/05/20 0457  WBC 19.7*  NEUTROABS 16.5*  HGB 14.7  HCT 48.3  MCV 96.6  PLT 388   Cardiac Enzymes: No results for input(s): CKTOTAL, CKMB, CKMBINDEX, TROPONINI in the last 168 hours.  BNP (last 3 results) No results for input(s): BNP in the last 8760 hours.  ProBNP (last 3 results) No results for input(s): PROBNP in the last 8760 hours.  CBG: Recent Labs  Lab 03/05/20 0451 03/05/20 0624 03/05/20 0710  GLUCAP >600* >600* >600*    Radiological Exams on Admission: No results found.  EKG: I independently viewed the EKG done and my findings are as followed: Sinus tachycardia at a rate of 123 bpm with PVCs  Assessment/Plan Present on Admission: . DKA (diabetic ketoacidosis) ([place]) . Essential hypertension, benign . AKI (acute kidney injury) ([place]) . Hypercholesterolemia  Principal Problem:   DKA (diabetic ketoacidosis) ([place]) Active Problems:   Essential hypertension, benign   Hypercholesterolemia   AKI (acute kidney injury) ([facility])   High anion gap  metabolic acidosis   Leukocytosis   Dehydration   Hyponatremia   Hyperkalemia   Hyperglycemia   Nausea & vomiting  High anion gap metabolic acidosis secondary to DKA/HHS Hyperglycemia secondary to poorly controlled type II DM It is quite unknown what triggered patient's DKA at this time ABG: 6.99/11.9/123/6.0 Continue insulin drip, IV LR per DKA protocol, IV KCl held at this time due to hyperkalemia (6.6), with plan to reinstate KCl with IV fluid when K+ < 5.3 Transition IV NS to D5 1/2NS when serum glucose reaches 250mg /dL Continue IV bicarbonate Continue serial BMP and beta hydroxybutyrate Continue to monitor for anion gap closure prior to transitioning  patient to subcu insulin Continue NPO  Nausea and vomiting in the setting of above Continue IV Zofran 4 mg every 6 hours as needed  Acute kidney injury/dehydration BUN to creatinine 44/2.94 (baseline creatinine at 0.8-1.2 Continue IV hydration Renally adjust medications, avoid nephrotoxic agents/dehydration/hypotension  Hyperkalemia K+ 6.6; calcium gluconate was given to protect the heart Continue insulin drip Continue to monitor potassium level with morning labs  Hyponatremia caused by hyperglycemia Na 132, corrected sodium level based on blood glucose (1,088) is 148 (hypernatremia) Continue IV hydration and continue to monitor sodium level with morning labs  Leukocytosis possibly reactive WBC 19.7 No obvious source of infection at this time Chest x-ray to rule out aspiration considering recurrent vomiting episodes will be checked Urinalysis pending Continue to monitor WBC with morning labs  Essential hypertension (controlled) Resume home lisinopril and metoprolol when patient resumes oral intake (currently n.p.o.)  Hypercholesterolemia Resume home Crestor when patient resumes oral intake (currently n.p.o.)  DVT prophylaxis: Lovenox  Code Status: Full code  Family Communication: Mom at bedside (all questions  answered to satisfaction)  Disposition Plan:  Patient is from:                        home Anticipated DC to:                   home Anticipated DC date:               1 day Anticipated DC barriers:           Patient is unstable at this time due to being in DKA/HHS and require inpatient treatment.   Consults called: None  Admission status: Observation    Bernadette Hoit MD [facility] Password [facility]  03/05/2020, 7:13 AM

## 2020-03-05 NOTE — ED Notes (Signed)
I assumed care of this patient at this time. At the time of assuming care, patient appears to be asleep in bed with family at the bedside. Respirations are even and unlabored with equal chest rise and fall. Pt is alert and oriented. All questions and concerns voiced addressed by this RN. Educated on hourly rounding and call light use and verbalized understanding at this time. Will continue to monitor.

## 2020-03-05 NOTE — Progress Notes (Signed)
PROGRESS NOTE  [name] IWP:[id number] DOB: 11-Jul-1965 DOA: 03/05/2020 PCP: Asencion Noble, MD  Brief History:  54 year old male with a history of diabetes mellitus type 2, hyperlipidemia, HIV, late latent syphilis, hypertension, coronary artery disease presenting with 1 day history of generalized weakness, nausea, and vomiting.  Patient states that he had been in his usual state of health until 03/03/2020 when he returned home from his mother's house.  He stated that he began feeling bad early in the morning even while he was in his mother's house helping her out.  When he returned home, he began having nausea and vomiting--too numerous to count episodes without any blood.  He also began having abdominal pain.  As result, he presented for further evaluation.  Interestingly, the patient states that he checked his CBGs at home, and they were reading "low".  He denies any recent fevers, chills, headache, neck pain, chest pain, shortness breath, abdominal pain, dysuria, hematuria, hematochezia, melena. In the emergency department, the patient was afebrile and hemodynamically stable.  He was tachycardic into the 140s.  Oxygen saturation was 100% room air.  BMP showed a serum creatinine 2.94, potassium 6.6, sodium 132.  WBC 19.7, hemoglobin 14.7, platelets 288,000.  The patient was started IV fluids and IV insulin for DKA.  VBG showed 6.9 90/12/123/6.  The patient was started on bicarbonate drip  Assessment/Plan: DKA type II -patient started on IV insulin with q 1 hour CBG check and q 4 hour BMPs -pt started on aggressive fluid resuscitation -Electrolytes were monitored and repleted -transitioned to [name] insulin once anion gap closed -diet was advanced once anion gap closed -01/23/2020 HbA1C--8.8 -Continue bicarbonate drip  Uncontrolled diabetes mellitus type 2 with hyperglycemia -Transition to subcutaneous insulin once out of DKA -Holding Ozempic and Metformin  Acute kidney injury  -Baseline creatinine 0.8-1.0 -Presented with serum creatinine 2.94 -Secondary to volume depletion -Continue fluid resuscitation -Holding lisinopril  Hyperkalemia -Anticipate improvement with insulin drip -Patient was given temporizing measures -Serial BMP  Essential hypertension -Start IV metoprolol until the patient is able to tolerate p.o. -Holding chlorthalidone and lisinopril  Hyperlipidemia -Start Crestor  HIV -Continue Biktarvy  Hyponatremia -Secondary to hypoglycemia and volume depletion -Continue normal saline  Leukocytosis Likely stress demargination -Remain off antibiotics for now -Personally reviewed chest x-ray--no consolidation -Obtain UA -Check PCT  Coronary artery disease -Resume aspirin -No chest pain presently -Personally reviewed EKG--sinus tachycardia, nonspecific ST changes      Status is: Observation  The patient will require care spanning > 2 midnights and should be moved to inpatient because: IV treatments appropriate due to intensity of illness or inability to take PO  Dispo: The patient is from: Home              Anticipated d/c is to: Home              Anticipated d/c date is: 2 days              Patient currently is not medically stable to d/c.        Family Communication:   No Family at bedside  Consultants:  none  Code Status:  FULL  DVT Prophylaxis:   [place] Lovenox   Procedures: As Listed in Progress Note Above  Antibiotics: None       Subjective: Patient states that his vomiting is improved.  He still nauseous.  He denies any abdominal pain, chest pain, shortness breath, headache, neck pain,  fevers, chills.  There is no dysuria or hematuria.  Objective: Vitals:   03/05/20 0500 03/05/20 0600 03/05/20 0700 03/05/20 0800  BP: (!) 136/54 135/62 (!) 117/58 (!) 104/56  Pulse: (!) 135 (!) 134 (!) 138 (!) 141  Resp: (!) 45 (!) 42 (!) 37 (!) 33  Temp:      TempSrc:      SpO2: 98% 100% 100% 98%  Weight:       Height:        Intake/Output Summary (Last 24 hours) at 03/05/2020 0859 Last data filed at 03/05/2020 0703 Gross per 24 hour  Intake 2796 ml  Output -  Net 2796 ml   Weight change:  Exam:   General:  Pt is alert, follows commands appropriately, not in acute distress  HEENT: No icterus, No thrush, No neck mass, [place]/AT  Cardiovascular: RRR, S1/S2, no rubs, no gallops  Respiratory: CTA bilaterally, no wheezing, no crackles, no rhonchi  Abdomen: Soft/+BS, non tender, non distended, no guarding  Extremities: No edema, No lymphangitis, No petechiae, No rashes, no synovitis   Data Reviewed: I have personally reviewed following labs and imaging studies Basic Metabolic Panel: Recent Labs  Lab 03/05/20 0457  NA 132*  K 6.6*  CL 93*  CO2 <7*  GLUCOSE 1,088*  BUN 44*  CREATININE 2.94*  CALCIUM 8.4*   Liver Function Tests: No results for input(s): AST, ALT, ALKPHOS, BILITOT, PROT, ALBUMIN in the last 168 hours. No results for input(s): LIPASE, AMYLASE in the last 168 hours. No results for input(s): AMMONIA in the last 168 hours. Coagulation Profile: No results for input(s): INR, PROTIME in the last 168 hours. CBC: Recent Labs  Lab 03/05/20 0457  WBC 19.7*  NEUTROABS 16.5*  HGB 14.7  HCT 48.3  MCV 96.6  PLT 388   Cardiac Enzymes: No results for input(s): CKTOTAL, CKMB, CKMBINDEX, TROPONINI in the last 168 hours. BNP: Invalid input(s): POCBNP CBG: Recent Labs  Lab 03/05/20 0451 03/05/20 0624 03/05/20 0710 03/05/20 0822  GLUCAP >600* >600* >600* >600*   HbA1C: No results for input(s): HGBA1C in the last 72 hours. Urine analysis:    Component Value Date/Time   COLORURINE STRAW (A) 03/23/2017 2058   APPEARANCEUR CLEAR 03/23/2017 2058   LABSPEC 1.028 03/23/2017 2058   PHURINE 5.0 03/23/2017 2058   GLUCOSEU >=500 (A) 03/23/2017 2058   HGBUR NEGATIVE 03/23/2017 2058   [place] 03/23/2017 2058   KETONESUR 80 (A) 03/23/2017 2058   PROTEINUR  NEGATIVE 03/23/2017 2058   UROBILINOGEN 0.2 01/04/2011 0710   NITRITE NEGATIVE 03/23/2017 2058   LEUKOCYTESUR NEGATIVE 03/23/2017 2058   Sepsis Labs: @LABRCNTIP (procalcitonin:4,lacticidven:4) ) Recent Results (from the past 240 hour(s))  Respiratory Panel by RT PCR (Flu A&B, Covid) - Nasopharyngeal Swab     Status: None   Collection Time: 03/05/20  5:42 AM   Specimen: Nasopharyngeal Swab  Result Value Ref Range Status   SARS Coronavirus 2 by RT PCR NEGATIVE NEGATIVE Final    Comment: (NOTE) SARS-CoV-2 target nucleic acids are NOT DETECTED.  The SARS-CoV-2 RNA is generally detectable in upper respiratoy specimens during the acute phase of infection. The lowest concentration of SARS-CoV-2 viral copies this assay can detect is 131 copies/mL. A negative result does not preclude SARS-Cov-2 infection and should not be used as the sole basis for treatment or other patient management decisions. A negative result may occur with  improper specimen collection/handling, submission of specimen other than nasopharyngeal swab, presence of viral mutation(s) within the areas targeted by this  assay, and inadequate number of viral copies (<131 copies/mL). A negative result must be combined with clinical observations, patient history, and epidemiological information. The expected result is Negative.  Fact Sheet for Patients:  PinkCheek.be  Fact Sheet for Healthcare Providers:  GravelBags.it  This test is no t yet approved or cleared by the [place] FDA and  has been authorized for detection and/or diagnosis of SARS-CoV-2 by FDA under an Emergency Use Authorization (EUA). This EUA will remain  in effect (meaning this test can be used) for the duration of the COVID-19 declaration under Section 564(b)(1) of the Act, 21 U.S.C. section 360bbb-3(b)(1), unless the authorization is terminated or revoked sooner.     Influenza A by PCR  NEGATIVE NEGATIVE Final   Influenza B by PCR NEGATIVE NEGATIVE Final    Comment: (NOTE) The Xpert Xpress SARS-CoV-2/FLU/RSV assay is intended as an aid in  the diagnosis of influenza from Nasopharyngeal swab specimens and  should not be used as a sole basis for treatment. Nasal washings and  aspirates are unacceptable for Xpert Xpress SARS-CoV-2/FLU/RSV  testing.  Fact Sheet for Patients: PinkCheek.be  Fact Sheet for Healthcare Providers: GravelBags.it  This test is not yet approved or cleared by the [place] FDA and  has been authorized for detection and/or diagnosis of SARS-CoV-2 by  FDA under an Emergency Use Authorization (EUA). This EUA will remain  in effect (meaning this test can be used) for the duration of the  Covid-19 declaration under Section 564(b)(1) of the Act, 21  U.S.C. section 360bbb-3(b)(1), unless the authorization is  terminated or revoked. Performed at [facility], [address] [place], [place] [address]      Scheduled Meds: . enoxaparin (LOVENOX) injection  40 mg Subcutaneous Q24H   Continuous Infusions: . dextrose 5% lactated ringers Stopped (03/05/20 0552)  . insulin 11.5 Units/hr (03/05/20 0552)  . lactated ringers    .  sodium bicarbonate (isotonic) infusion in sterile water 125 mL/hr at 03/05/20 7408    Procedures/Studies: DG Chest Port 1 View  Result Date: 03/05/2020 CLINICAL DATA:  Dyspnea. Additional history provided: DKA, altered mental status, wheezing, COVID -10 1221. Additional history provided: History of HIV, diabetes, hypertension, nonsmoker. EXAM: PORTABLE CHEST 1 VIEW COMPARISON:  Prior chest radiograph 11/21/2019. FINDINGS: Heart size within normal limits. No appreciable airspace consolidation. No evidence of pleural effusion or pneumothorax. No acute bony abnormality identified. IMPRESSION: No evidence of acute cardiopulmonary abnormality. Electronically Signed   By: Kellie Simmering DO   On: 03/05/2020 07:51    Orson Eva, DO  [facility]  If 7PM-7AM, please contact night-coverage [email or web address] Password [email or web address] 03/05/2020, 8:59 AM   LOS: 0 days

## 2020-03-05 NOTE — ED Notes (Signed)
Continue insulin at 11.5

## 2020-03-05 NOTE — ED Notes (Signed)
CRITICAL VALUE ALERT  Critical Value:  704 glucose  Date & Time Notied:  0922 03/05/20  Provider Notified: [email or web address]  Orders Received/Actions taken: md notified

## 2020-03-05 NOTE — Progress Notes (Signed)
Inpatient Diabetes Program Recommendations  AACE/ADA: New Consensus Statement on Inpatient Glycemic Control (2015)  Target Ranges:  Prepandial:   less than 140 mg/dL      Peak postprandial:   less than 180 mg/dL (1-2 hours)      Critically ill patients:  140 - 180 mg/dL   Lab Results  Component Value Date   GLUCAP >600 ([name]) 03/05/2020   HGBA1C 8.8 ([place]) 01/23/2020    Review of Glycemic Control  Diabetes history: DM type 2 Outpatient Diabetes medications: Lantus 38 units qhs, Metformin 2000 mg at supper, Humalog 11-15 units tid, Semaglutide 1 mg weekly Current orders for Inpatient glycemic control:  IV insulin gtt/Endotool  Inpatient Diabetes Program Recommendations:    DKA remain on IV insulin for now. Noted WBC's elevated.  Unsure why glucose levels are so elevated with pt taking insulin at home. Pt sees Dr. Cruzita Lederer, Endocrinologist for DM management. Last visit was 10/5. Glucose levels ranged from hypoglycemia up to 300. Adjustments made. Pt was to schedule follow up in 2 months.   Pt will need to call Dr. Arman Filter office after hospitalization to inform them of DKA in addition to medication adjustments if needed.  Thanks, Tama Headings RN, MSN, BC-ADM Inpatient Diabetes Coordinator Team Pager [phone] (8a-5p)

## 2020-03-05 NOTE — Progress Notes (Signed)
Transition orders received from [place] NP. Will follow the orders and transition him off the insulin drip at 0100. Will continue to monitor.

## 2020-03-05 NOTE — ED Notes (Signed)
Insulin drip stays at 11.5

## 2020-03-05 NOTE — ED Triage Notes (Signed)
Pt arrives c/o hyperglycemia. CBG 589 for EMS.

## 2020-03-06 DIAGNOSIS — E1110 Type 2 diabetes mellitus with ketoacidosis without coma: Secondary | ICD-10-CM | POA: Diagnosis not present

## 2020-03-06 LAB — GLUCOSE, CAPILLARY
Glucose-Capillary: 132 mg/dL — ABNORMAL HIGH (ref 70–99)
Glucose-Capillary: 134 mg/dL — ABNORMAL HIGH (ref 70–99)
Glucose-Capillary: 154 mg/dL — ABNORMAL HIGH (ref 70–99)
Glucose-Capillary: 208 mg/dL — ABNORMAL HIGH (ref 70–99)
Glucose-Capillary: 301 mg/dL — ABNORMAL HIGH (ref 70–99)

## 2020-03-06 LAB — CBC
HCT: 38.5 % — ABNORMAL LOW (ref 39.0–52.0)
Hemoglobin: 12.7 g/dL — ABNORMAL LOW (ref 13.0–17.0)
MCH: 28.8 pg (ref 26.0–34.0)
MCHC: 33 g/dL (ref 30.0–36.0)
MCV: 87.3 fL (ref 80.0–100.0)
Platelets: 236 10*3/uL (ref 150–400)
RBC: 4.41 MIL/uL (ref 4.22–5.81)
RDW: 13.2 % (ref 11.5–15.5)
WBC: 14.1 10*3/uL — ABNORMAL HIGH (ref 4.0–10.5)
nRBC: 0 % (ref 0.0–0.2)

## 2020-03-06 LAB — BASIC METABOLIC PANEL
Anion gap: 9 (ref 5–15)
BUN: 27 mg/dL — ABNORMAL HIGH (ref 6–20)
CO2: 31 mmol/L (ref 22–32)
Calcium: 8.1 mg/dL — ABNORMAL LOW (ref 8.9–10.3)
Chloride: 103 mmol/L (ref 98–111)
Creatinine, Ser: 1.18 mg/dL (ref 0.61–1.24)
GFR, Estimated: >60 mL/min (ref >60)
Glucose, Bld: 146 mg/dL — ABNORMAL HIGH (ref 70–99)
Potassium: 3.5 mmol/L (ref 3.5–5.1)
Sodium: 143 mmol/L (ref 135–145)

## 2020-03-06 LAB — URINALYSIS, ROUTINE W REFLEX MICROSCOPIC
Bilirubin Urine: NEGATIVE
Glucose, UA: 150 mg/dL — AB
Hgb urine dipstick: NEGATIVE
Ketones, ur: 80 mg/dL — AB
Leukocytes,Ua: NEGATIVE
Nitrite: NEGATIVE
Protein, ur: NEGATIVE mg/dL
Specific Gravity, Urine: 1.023 (ref 1.005–1.030)
pH: 6 (ref 5.0–8.0)

## 2020-03-06 MED ORDER — INSULIN GLARGINE 100 UNIT/ML ~~LOC~~ SOLN: 38.0000 [IU] | Freq: Every day | SUBCUTANEOUS | Status: DC

## 2020-03-06 MED ORDER — BICTEGRAVIR-EMTRICITAB-TENOFOV 50-200-25 MG PO TABS
1.0000 | ORAL_TABLET | Freq: Every day | ORAL | Status: DC
  Administered 2020-03-06: 1 via ORAL
  Filled 2020-03-06: qty 1

## 2020-03-06 MED ORDER — CHLORTHALIDONE 25 MG PO TABS: 12.5000 mg | ORAL_TABLET | Freq: Every day | ORAL | Status: DC

## 2020-03-06 MED ORDER — INSULIN ASPART 100 UNIT/ML ~~LOC~~ SOLN
6.0000 [IU] | Freq: Three times a day (TID) | SUBCUTANEOUS | Status: DC
  Administered 2020-03-06: 6 [IU] via SUBCUTANEOUS

## 2020-03-06 MED ORDER — VITAMIN D (ERGOCALCIFEROL) 1.25 MG (50000 UNIT) PO CAPS: 50000.0000 [IU] | ORAL_CAPSULE | ORAL | Status: DC

## 2020-03-06 MED ORDER — METFORMIN HCL ER 500 MG PO TB24: 2000.0000 mg | ORAL_TABLET | Freq: Every day | ORAL | Status: DC

## 2020-03-06 MED ORDER — LISINOPRIL 10 MG PO TABS: 10.0000 mg | ORAL_TABLET | Freq: Every day | ORAL | Status: DC

## 2020-03-06 MED ORDER — ADULT MULTIVITAMIN W/MINERALS CH: 1.0000 | ORAL_TABLET | Freq: Every day | ORAL | Status: DC

## 2020-03-06 MED ORDER — ROSUVASTATIN CALCIUM 20 MG PO TABS
40.0000 mg | ORAL_TABLET | Freq: Every day | ORAL | Status: DC
  Administered 2020-03-06: 40 mg via ORAL
  Filled 2020-03-06: qty 2

## 2020-03-06 MED ORDER — INSULIN GLARGINE 100 UNIT/ML ~~LOC~~ SOLN
20.0000 [IU] | Freq: Once | SUBCUTANEOUS | Status: AC
  Administered 2020-03-06: 20 [IU] via SUBCUTANEOUS
  Filled 2020-03-06: qty 0.2

## 2020-03-06 MED ORDER — INSULIN GLARGINE 100 UNIT/ML ~~LOC~~ SOLN: 45.0000 [IU] | Freq: Every day | SUBCUTANEOUS | Status: DC

## 2020-03-06 MED ORDER — ASPIRIN EC 81 MG PO TBEC: 81.0000 mg | DELAYED_RELEASE_TABLET | Freq: Every day | ORAL | Status: DC

## 2020-03-06 MED ORDER — METOPROLOL SUCCINATE ER 25 MG PO TB24
100.0000 mg | ORAL_TABLET | Freq: Every day | ORAL | Status: DC
  Administered 2020-03-06: 100 mg via ORAL
  Filled 2020-03-06: qty 4

## 2020-03-06 NOTE — TOC Initial Note (Signed)
Transition of Care [facility]) - Initial/Assessment Note    Patient Details  Name: [name] MRN: [id number] Date of Birth: Jul 09, 1965  Transition [facility] CM/SW Contact:    Leeroy Cha, RN Phone Number: 03/06/2020, 8:26 AM  Clinical Narrative:                 54 y.o. male with medical history significant for type 2 diabetes mellitus, hypertension, hyperlipidemia who presents to the emergency department accompanied by mother due to worsening generalized weakness which started yesterday.  History was obtained from patient and mother.  Per mother at bedside, patient was with her yesterday in the morning and patient states that he was tired and needed to return home, she checked on him in the evening and noted that patient was having increased work of breathing, so she insisted that patient should go home with her, on getting to mother's house, patient's condition worsened with increased thirst, and urinary frequency.  He states that he checked his blood glucose at home and his glucose monitor kept reading "low".  He states that he has been compliant with his medication, except for today due to feeling sick.  He complained of several episodes of nonbilious, nonbloody vomitus and denies fever, chills, headache, chest pain and diarrhea.  Patient denies any recent change in medication. [id number] room air, iv insulin, iv lr with 20 kcl at 125, iv lr at 125/hr, wbc 14.1, bld glucose 146, capillary glucose 301. Following for progression Following for possible toc needs Diabetes counselor to see pt. Expected Discharge Plan: Home/Self Care Barriers to Discharge: Barriers Unresolved (comment)   Patient Goals and CMS Choice Patient states their goals for this hospitalization and ongoing recovery are:: to go home CMS Medicare.gov Compare Post Acute Care list provided to:: Patient    Expected Discharge Plan and Services Expected Discharge Plan: Home/Self Care       Living arrangements for  the past 2 months: Single Family Home                                      Prior Living Arrangements/Services Living arrangements for the past 2 months: Single Family Home Lives with:: Self Patient language and need for interpreter reviewed:: Yes Do you feel safe going back to the place where you live?: Yes      Need for Family Participation in Patient Care: Yes (Comment) Care giver support system in place?: Yes (comment)   Criminal Activity/Legal Involvement Pertinent to Current Situation/Hospitalization: No - Comment as needed  Activities of Daily Living Home Assistive Devices/Equipment: Eyeglasses, CBG Meter ADL Screening (condition at time of admission) Patient's cognitive ability adequate to safely complete daily activities?: Yes Is the patient deaf or have difficulty hearing?: No Does the patient have difficulty seeing, even when wearing glasses/contacts?: No Does the patient have difficulty concentrating, remembering, or making decisions?: No Patient able to express need for assistance with ADLs?: Yes Does the patient have difficulty dressing or bathing?: No Independently performs ADLs?: Yes (appropriate for developmental age) Does the patient have difficulty walking or climbing stairs?: No Weakness of Legs: Both Weakness of Arms/Hands: Both  Permission Sought/Granted                  Emotional Assessment Appearance:: Appears stated age Attitude/Demeanor/Rapport: Engaged Affect (typically observed): Calm Orientation: : Oriented to Self, Oriented to Place, Oriented to  Time, Oriented to Situation Alcohol /  Substance Use: Not Applicable Psych Involvement: No (comment)  Admission diagnosis:  Dyspnea [R06.00] DKA (diabetic ketoacidosis) ([place]) [E11.10] DKA, type 2 ([place]) [E11.10] Diabetic ketoacidosis without coma associated with type 2 diabetes mellitus ([place]) [E11.10] Patient Active Problem List   Diagnosis Date Noted  . DKA (diabetic ketoacidosis) ([place])  03/05/2020  . High anion gap metabolic acidosis 11/15/7626  . Leukocytosis 03/05/2020  . Dehydration 03/05/2020  . Hyponatremia 03/05/2020  . Hyperkalemia 03/05/2020  . Hyperglycemia 03/05/2020  . Nausea & vomiting 03/05/2020  . DKA, type 2 ([place]) 03/05/2020  . Other fatigue 03/14/2018  . Shortness of breath on exertion 03/14/2018  . Uncontrolled type 2 diabetes mellitus with hyperglycemia ([place]) 02/08/2018  . Mixed hyperlipidemia 02/08/2018  . AKI (acute kidney injury) ([place]) 03/23/2017  . Special screening for malignant neoplasms, colon 11/18/2016  . Family history of colon cancer 11/18/2016  . Lung nodule, multiple 09/27/2014  . Poorly controlled type 2 diabetes mellitus with circulatory disorder ([place]) 09/27/2014  . Early syphilis, 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  . Hypercholesterolemia 01/25/2013  . Ejection fraction   . Essential hypertension, benign   . Human immunodeficiency virus (HIV) disease ([place]) 09/11/2011  . Kidney stone on left side 01/04/2011  . DKA (diabetic ketoacidoses) 01/04/2011  . Coronary artery disease due to lipid rich plaque   . Secondary syphilis   . LFTs abnormal    PCP:  Asencion Noble, MD Pharmacy:   [place], [place] - [address] [place] [address] Phone: [phone] Fax: [phone]     Social Determinants of Health (SDOH) Interventions    Readmission Risk Interventions No flowsheet data found.

## 2020-03-06 NOTE — Discharge Summary (Addendum)
Physician Discharge Summary  [name] QIW:[id number] DOB: 1965/10/05  PCP: Asencion Noble, MD  Admitted from: Home Discharged to: Home  Admit date: 03/05/2020 Discharge date: 03/06/2020  Recommendations for Outpatient Follow-up:    Follow-up Information    Asencion Noble, MD. Schedule an appointment as soon as possible for a visit in 1 week(s).   Specialty: Internal Medicine Why: To be seen with repeat labs (CBC & BMP). Contact information: [address] [place] [address] [phone]        Tommy Medal, Lavell Islam, MD. Schedule an appointment as soon as possible for a visit.   Specialty: Infectious Diseases Contact information: [address] [address] [phone]        Philemon Kingdom, MD. Schedule an appointment as soon as possible for a visit.   Specialty: Internal Medicine Why: Can call her office with any questions pertaining to your blood sugar control. Contact information: [address] [place] [address] [phone]                Home Health: None Equipment/Devices: None  Discharge Condition: Improved and stable CODE STATUS: Full Diet recommendation: Heart healthy & diabetic diet.  Discharge Diagnoses:  Principal Problem:   DKA (diabetic ketoacidosis) ([place]) Active Problems:   Essential hypertension, benign   Hypercholesterolemia   AKI (acute kidney injury) (HCC)   High anion gap metabolic acidosis   Leukocytosis   Dehydration   Hyponatremia   Hyperkalemia   Hyperglycemia   Nausea & vomiting   DKA, type 2 (HCC)   Brief Summary: 54 year old male with PMH of DM2/IDDM, HLD, HIV, late latent syphilis, HTN, CAD, initially presented to the [facility] ED accompanied by his mother due to worsening generalized weakness which started the day prior.  Patient was reportedly with his mother on 03/04/2020 and in the morning he felt tired and returned home.  She checked on him in the  evening and noted that patient was having increased work of breathing and so she insisted that patient showed go home with her.  On returning to patient's mother's house, his condition worsened with increased thirst and urinary frequency.  He checked his blood sugar at home and his glucose monitor kept reading "low".  He reports compliance with all his medications.  He also reported several episodes of nonbilious, nonbloody emesis and denied fever, chills, headache, chest pain or diarrhea.  He recently saw an endocrinologist for the first time.  The only change was increase in his dose of Ozempic which he started 03/03/2020.  ED course: In the ED patient was noted to be tachypneic, tachycardic but other vital signs were within normal range.  Lab work significant for: VBG: pH 6.99, PCO2 11.9, PO2 123, acid base deficit 26.8, HCO3 6 and oxygen saturation 95.1.  Initial BMP: Sodium 132, potassium 6.6, chloride 93, bicarbonate <7, blood glucose 1088, BUN 44, creatinine 2.94.  CBC: WBC 19.7, hemoglobin 14.7, platelets 338.  Beta hydroxybutyric acid >8.  Flu panel, RSV panel and COVID-19 PCR negative.  In the ED he was given calcium gluconate, IV bicarbonate and initiated on DKA protocol.  Due to lack of stepdown unit beds, patient was then transferred to the [facility].  Assessment and plan:  DKA in type II DM not at goal: -Patient was treated per DKA protocol with close BMP monitoring, CBG monitoring, aggressive IV fluid hydration and IV insulin drip.  He was also briefly on bicarbonate drip. -After his DKA  resolved and his glycemic control improved, patient was transitioned to subcutaneous Lantus.  He received Lantus 18 units at approximately 10 PM last night and started on NovoLog SSI.  Diet was resumed.  Patient did make quicker than expected recovery.  Nausea and vomiting which were likely related to DKA have resolved and patient is tolerating diet. -Hemoglobin A1c 01/23/2020: 8.8. -This morning  I discussed with patient's endocrinologist Dr. Benjiman Core and reviewed patient's case in detail.  It is not entirely clear as to precipitant for his DKA.  CBG this morning: 301 mg per DL.  As advised and agreed with her, gave Lantus 20 units x 1 this morning, for Lantus 35 units x 1 at bedtime tonight and then return to his prior regimen of Lantus , continue prior home dose of Humalog, Metformin, Ozempic. -There is high concern that patient's freestyle Elenor Legato is not working the way it should.  He was advised to change the sensor after he went home.  Also for a day or 2, simultaneously check his CBG using the freestyle libre as well as fingerstick CBG to make sure they correlate.  He claims compliance with diet and his meds. -As per Dr. Arman Filter advice, he is to keep up his prior follow-up visit with her but is advised to contact her office if there are any issues with his glycemic control.  He verbalized understanding.  She will also run some tests as an outpatient to evaluate for type I DM. -Reviewed office visit with Dr. Cruzita Lederer on 10/5: She had made several medication adjustments.  Patient verbalized changing Metformin ER to 2 g at bedtime, increasing Ozempic to 1 mg weekly, however unsure if he had reduced Lantus to 38 units at bedtime (she had cut back on his Lantus dose due to episodes of hypoglycemia that he reported).  Would leave him on current Humalog doses as noted below, close CBG monitoring and follow-up with PCP and endocrinology as outpatient.  Acute kidney injury: -Baseline creatinine 0.8-1.0. -Presented with creatinine of 2.94. -Likely secondary to dehydration related to DKA. -Creatinine has improved to 1.18 after aggressive IV fluid resuscitation. -Advised to hold lisinopril and chlorthalidone for additional 48 hours to allow for complete recovery before resuming. -Follow BMP closely as outpatient.  Hyperkalemia -Secondary to DKA, AKI and lisinopril. -S/p temporizing measures  as noted above. -Serial BMP monitored and hyperkalemia has resolved. -Holding lisinopril and chlorthalidone for 48 hours prior to resumption at home.  Follow BMP closely as outpatient.  Essential hypertension -Briefly on IV scheduled metoprolol while he was n.p.o. from inability to tolerate oral intake. -Resumed prior home dose of Toprol-XL 100 mg daily this morning.  His mild sinus tachycardia this morning was probably related to rebound from lower dose of beta-blockers and also increased his IV fluids briefly in case he was still somewhat volume depleted.  Increased ad lib. oral fluid intake.  Hyperlipidemia: -Resume home dose of Crestor.  HIV -Continue Biktarvy.  As per discussion with Dr. Cruzita Lederer, this is not likely to cause issues with his DM control.  Dehydration with hyponatremia: - Hyponatremia a combination of dehydration and hyperglycemia/pseudohyponatremia. - Resolved.  Leukocytosis -Likely stress margination.  Improved.  No clinical concern for infection. -Chest x-ray without acute findings.  Suggestive of UTI. -Although procalcitonin is elevated, clinically without infection or pneumonia.  CAD -No anginal symptoms. -Continue aspirin, statins and beta-blockers.  Improved glycemic control.  Consultations:  None  Procedures:  None   Discharge Instructions  Discharge Instructions  Call MD for:  difficulty breathing, headache or visual disturbances   Complete by: As directed    Call MD for:  extreme fatigue   Complete by: As directed    Call MD for:  persistant dizziness or light-headedness   Complete by: As directed    Call MD for:  persistant nausea and vomiting   Complete by: As directed    Call MD for:  severe uncontrolled pain   Complete by: As directed    Call MD for:  temperature >100.4   Complete by: As directed    Diet - low sodium heart healthy   Complete by: As directed    Diet Carb Modified   Complete by: As directed    Increase activity  slowly   Complete by: As directed        Medication List    STOP taking these medications   pantoprazole 40 MG tablet Commonly known as: PROTONIX     TAKE these medications   aspirin 81 MG tablet Take 1 tablet (81 mg total) by mouth daily with breakfast.   bictegravir-emtricitabine-tenofovir AF 50-200-25 MG Tabs tablet Commonly known as: BIKTARVY Take 1 tablet by mouth daily.   chlorthalidone 25 MG tablet Commonly known as: HYGROTON Take 0.5 tablets (12.5 mg total) by mouth daily. Start taking on: March 08, 2020 What changed: These instructions start on March 08, 2020. If you are unsure what to do until then, ask your doctor or other care provider.   HumaLOG KwikPen 100 UNIT/ML KwikPen Generic drug: insulin lispro Inject 11-15 Units into the skin 3 (three) times daily. Per sliding scale-patient monitored adding 1 unit after levels of 150   insulin glargine 100 UNIT/ML injection Commonly known as: LANTUS Inject 0.38 mLs (38 Units total) into the skin at bedtime. Take 35 units at bedtime only for tonight (03/06/2020) and return to previous dose of 38 units at bedtime from tomorrow night (03/07/2020). What changed:   how much to take  additional instructions   lisinopril 10 MG tablet Commonly known as: ZESTRIL Take 1 tablet (10 mg total) by mouth daily. Start taking on: March 08, 2020 What changed: These instructions start on March 08, 2020. If you are unsure what to do until then, ask your doctor or other care provider.   metFORMIN 500 MG 24 hr tablet Commonly known as: Glucophage XR Take 4 tablets (2,000 mg total) by mouth at bedtime. What changed:   how much to take  when to take this   metoprolol succinate 100 MG 24 hr tablet Commonly known as: TOPROL-XL Take 100 mg by mouth daily.   multivitamin with minerals tablet Take 1 tablet by mouth daily.   Ozempic (1 MG/DOSE) 4 MG/3ML Sopn Generic drug: Semaglutide (1 MG/DOSE) Inject 1 mg into the skin  once a week.   rosuvastatin 40 MG tablet Commonly known as: CRESTOR Take 1 tablet (40 mg total) by mouth daily.   Vitamin D (Ergocalciferol) 1.25 MG (50000 UNIT) Caps capsule Commonly known as: DRISDOL Take 1 capsule (50,000 Units total) by mouth 2 (two) times a week. Sunday & Wednesday Start taking on: March 07, 2020      Allergies  Allergen Reactions  . Pepcid [Famotidine] Other (See Comments)    Contraindicated with ODEFSEY  . Prilosec [Omeprazole] Other (See Comments)    Contraindicated with RPV (lowers level of this ARV in ODEFSEY      Procedures/Studies: DG Chest Port 1 View  Result Date: 03/05/2020 CLINICAL DATA:  Dyspnea. Additional  history provided: DKA, altered mental status, wheezing, COVID -10 1221. Additional history provided: History of HIV, diabetes, hypertension, nonsmoker. EXAM: PORTABLE CHEST 1 VIEW COMPARISON:  Prior chest radiograph 11/21/2019. FINDINGS: Heart size within normal limits. No appreciable airspace consolidation. No evidence of pleural effusion or pneumothorax. No acute bony abnormality identified. IMPRESSION: No evidence of acute cardiopulmonary abnormality. Electronically Signed   By: Kellie Simmering DO   On: 03/05/2020 07:51      Subjective: Patient feels somewhat generally weak but denies any other complaints.  Feels much better compared to admission.  No nausea, vomiting or pain.  Tolerating diet.  Denies dizziness, lightheadedness, chest pain or palpitations.  Discharge Exam:  Vitals:   03/06/20 0800 03/06/20 0822 03/06/20 0900 03/06/20 1000  BP: (!) 149/70  (!) 141/72 133/69  Pulse: (!) 105  (!) 105 (!) 106  Resp: 20  19 18   Temp:  98.2 F (36.8 C)    TempSrc:  Oral    SpO2: 91%  93% 92%  Weight:      Height:        General: Pleasant young male, moderately built and overweight lying comfortably propped up in bed without distress.  Oral mucosa moist. Cardiovascular: S1 & S2 heard, RRR, S1/S2 +. No murmurs, rubs, gallops or  clicks. No JVD or pedal edema.  Telemetry personally reviewed: Mild sinus tachycardia in the low 100s but otherwise no arrhythmias. Respiratory: Clear to auscultation without wheezing, rhonchi or crackles. No increased work of breathing. Abdominal:  Non distended, non tender & soft. No organomegaly or masses appreciated. Normal bowel sounds heard. CNS: Alert and oriented. No focal deficits. Extremities: no edema, no cyanosis    The results of significant diagnostics from this hospitalization (including imaging, microbiology, ancillary and laboratory) are listed below for reference.     Microbiology: Recent Results (from the past 240 hour(s))  Respiratory Panel by RT PCR (Flu A&B, Covid) - Nasopharyngeal Swab     Status: None   Collection Time: 03/05/20  5:42 AM   Specimen: Nasopharyngeal Swab  Result Value Ref Range Status   SARS Coronavirus 2 by RT PCR NEGATIVE NEGATIVE Final    Comment: (NOTE) SARS-CoV-2 target nucleic acids are NOT DETECTED.  The SARS-CoV-2 RNA is generally detectable in upper respiratoy specimens during the acute phase of infection. The lowest concentration of SARS-CoV-2 viral copies this assay can detect is 131 copies/mL. A negative result does not preclude SARS-Cov-2 infection and should not be used as the sole basis for treatment or other patient management decisions. A negative result may occur with  improper specimen collection/handling, submission of specimen other than nasopharyngeal swab, presence of viral mutation(s) within the areas targeted by this assay, and inadequate number of viral copies (<131 copies/mL). A negative result must be combined with clinical observations, patient history, and epidemiological information. The expected result is Negative.  Fact Sheet for Patients:  PinkCheek.be  Fact Sheet for Healthcare Providers:  GravelBags.it  This test is no t yet approved or cleared by  the [place] FDA and  has been authorized for detection and/or diagnosis of SARS-CoV-2 by FDA under an Emergency Use Authorization (EUA). This EUA will remain  in effect (meaning this test can be used) for the duration of the COVID-19 declaration under Section 564(b)(1) of the Act, 21 U.S.C. section 360bbb-3(b)(1), unless the authorization is terminated or revoked sooner.     Influenza A by PCR NEGATIVE NEGATIVE Final   Influenza B by PCR NEGATIVE NEGATIVE Final  Comment: (NOTE) The Xpert Xpress SARS-CoV-2/FLU/RSV assay is intended as an aid in  the diagnosis of influenza from Nasopharyngeal swab specimens and  should not be used as a sole basis for treatment. Nasal washings and  aspirates are unacceptable for Xpert Xpress SARS-CoV-2/FLU/RSV  testing.  Fact Sheet for Patients: PinkCheek.be  Fact Sheet for Healthcare Providers: GravelBags.it  This test is not yet approved or cleared by the [place] FDA and  has been authorized for detection and/or diagnosis of SARS-CoV-2 by  FDA under an Emergency Use Authorization (EUA). This EUA will remain  in effect (meaning this test can be used) for the duration of the  Covid-19 declaration under Section 564(b)(1) of the Act, 21  U.S.C. section 360bbb-3(b)(1), unless the authorization is  terminated or revoked. Performed at [facility], [address] [place], [place] [address]   MRSA PCR Screening     Status: None   Collection Time: 03/05/20  6:21 PM   Specimen: Nasal Mucosa; Nasopharyngeal  Result Value Ref Range Status   MRSA by PCR NEGATIVE NEGATIVE Final    Comment:        The GeneXpert MRSA Assay (FDA approved for NASAL specimens only), is one component of a comprehensive MRSA colonization surveillance program. It is not intended to diagnose MRSA infection nor to guide or monitor treatment for MRSA infections. Performed at [facility],  [place] [address] [place], [place] [address]      Labs: CBC: Recent Labs  Lab 03/05/20 0457 03/05/20 0859 03/06/20 0647  WBC 19.7* 23.8* 14.1*  NEUTROABS 16.5*  --   --   HGB 14.7 14.3 12.7*  HCT 48.3 45.1 38.5*  MCV 96.6 92.4 87.3  PLT 388 307 338    Basic Metabolic Panel: Recent Labs  Lab 03/05/20 0859 03/05/20 1334 03/05/20 1600 03/05/20 2128 03/06/20 0248  NA 140 142 143 145 143  K 4.4 3.5 3.3* 3.2* 3.5  CL 100 105 105 105 103  CO2 7* 17* 23 27 31   GLUCOSE 704* 294* 182* 158* 146*  BUN 43* 40* 38* 30* 27*  CREATININE 2.74* 2.08* 1.70* 1.20 1.18  CALCIUM 8.8* 8.7* 8.5* 8.0* 8.1*    Liver Function Tests: No results for input(s): AST, ALT, ALKPHOS, BILITOT, PROT, ALBUMIN in the last 168 hours.  CBG: Recent Labs  Lab 03/05/20 2310 03/06/20 0009 03/06/20 0057 03/06/20 0750 03/06/20 1150  GLUCAP 139* 134* 132* 301* 208*     Urinalysis    Component Value Date/Time   COLORURINE YELLOW 03/06/2020 0626   APPEARANCEUR CLEAR 03/06/2020 0626   LABSPEC 1.023 03/06/2020 0626   PHURINE 6.0 03/06/2020 0626   GLUCOSEU 150 (A) 03/06/2020 0626   HGBUR NEGATIVE 03/06/2020 0626   BILIRUBINUR NEGATIVE 03/06/2020 0626   KETONESUR 80 (A) 03/06/2020 0626   PROTEINUR NEGATIVE 03/06/2020 0626   UROBILINOGEN 0.2 01/04/2011 0710   NITRITE NEGATIVE 03/06/2020 0626   LEUKOCYTESUR NEGATIVE 03/06/2020 0626      Time coordinating discharge: 40 minutes  SIGNED:  Vernell Leep, MD, FACP, [facility]. [facility]  To contact the attending provider between 7A-7P or the covering provider during after hours 7P-7A, please log into the web site [email or web address] and access using universal [place] password for that web site. If you do not have the password, please call the hospital operator.

## 2020-03-06 NOTE — Discharge Instructions (Signed)

## 2020-03-06 NOTE — Progress Notes (Signed)
Inpatient Diabetes Program Recommendations  AACE/ADA: New Consensus Statement on Inpatient Glycemic Control (2015)  Target Ranges:  Prepandial:   less than 140 mg/dL      Peak postprandial:   less than 180 mg/dL (1-2 hours)      Critically ill patients:  140 - 180 mg/dL   Lab Results  Component Value Date   GLUCAP 301 (H) 03/06/2020   HGBA1C 8.8 (H) 01/23/2020    Review of Glycemic Control  Diabetes history: DM2 Outpatient Diabetes medications: Lantus 38 units QHS, metformin 2000 mg at supper, Humalog 11-15 units tid, Semaglutide 1 mg weekly Current orders for Inpatient glycemic control: Lantus 18 units QHS, Novolog 0-15 units tidwc and hs + 6 units tidwc  HgbA1C - 8.8% Endo - Dr Cruzita Lederer. Last visit 02/27/20. Supposed to f/u in 2 months  Inpatient Diabetes Program Recommendations:     Follow-up with Endo after hospitalization.  Spoke with pt at length regarding his diabetes control and reason for DKA admission. Pt states his Elenor Legato was reading low and he ate mints to bring it up. Started feeling bad and EMS was called. Blood sugar > 600 on admission. Pt states he had not taken insulin on day he was hospitalized. We discussed HgbA1C of 8.8% and goal of 7%. Also discussed monitoring of blood sugars at least 3-4x/day. Discussed how exercise, eating, meds, stress all affect blood sugar control. Hypo awareness and s/s and treatment. Pt voiced understanding. No questions.  Also spoke with RN regarding above.  Thank you. Lorenda Peck, RD, LDN, CDE Inpatient Diabetes Coordinator [phone]

## 2020-03-07 ENCOUNTER — Other Ambulatory Visit: Payer: Self-pay | Admitting: *Deleted

## 2020-03-07 NOTE — Patient Outreach (Signed)
[facility]) Care Management  03/07/2020  [name] [birth year] [id number]   Transition of care telephone call  Referral received:03/06/20 Initial outreach:03/07/20 Insurance: [facility]   Initial unsuccessful telephone call to patient's preferred number in order to complete transition of care assessment; no answer, left HIPAA compliant voicemail message requesting return call.   Objective: Per the electronic medical record, [name]  was hospitalized at [facility] from 10/12=03/06/20 with/for Diabetes Ketoacidosis  . Comorbidities include: Type 2 Diabetes, 8.8% A1c, Hypertension  He was discharged to home on 03/06/20 without the need for home health services or durable medical equipment per the discharge summary.   Plan: This RNCM will route unsuccessful outreach letter with [place] Management pamphlet and 24 hour Nurse Advice Line Magnet to [place] Management clinical pool to be mailed to patient's home address. This RNCM will attempt another outreach within 4 business days.  Joylene Draft, RN, BSN  [place] Management Coordinator  [phone]- Mobile [phone]- Toll Free Main Office

## 2020-03-11 ENCOUNTER — Other Ambulatory Visit (HOSPITAL_COMMUNITY): Payer: Self-pay | Admitting: Internal Medicine

## 2020-03-12 ENCOUNTER — Other Ambulatory Visit: Payer: Self-pay | Admitting: *Deleted

## 2020-03-12 NOTE — Patient Outreach (Signed)
[facility]) Care Management  03/12/2020  [name] 10/11/1965 [id number]   Transition of care call Referral received: 03/06/20 Initial outreach attempt:03/07/20 Insurance: [employer]    2nd unsuccessful telephone call to patient's preferred contact number in order to complete post hospital discharge transition of care assessment , no answer left HIPAA compliant message requesting return call.    Objective: Per the electronic medical record, [name]  was hospitalized at [facility] from 10/12=03/06/20 with/for Diabetes Ketoacidosis  . Comorbidities include: Type 2 Diabetes, 8.8% A1c, Hypertension  He was discharged to home on 03/06/20 without the need for home health services or durable medical equipment per the discharge summary.   Plan If no return call from patient will attempt 3rd outreach in the next 4 business days.   Joylene Draft, RN, BSN  [place] Management Coordinator  [phone]- Mobile [phone]- Toll Free Main Office

## 2020-03-14 MED FILL — LANTUS SOLOSTAR 100 UNITS/M: 100 | 28 days supply | Qty: 30 | Fill #0

## 2020-03-15 ENCOUNTER — Other Ambulatory Visit: Payer: Self-pay | Admitting: *Deleted

## 2020-03-15 NOTE — Patient Outreach (Signed)
[facility]) Care Management  03/15/2020  [name] Dec 20, 1965 [id number]   Transition of care call Referral received: 03/06/20 Initial outreach attempt: 03/07/20 Insurance: [place] unsuccessful telephone call to patient's preferred contact number in order to complete post hospital discharge transition of care assessment; no answer, left HIPAA compliant message requesting return call.   Objective: Per the electronic medical record, Mr. Ladnier hospitalized at [facility] from 10/12=10/13/21with/for Diabetes Ketoacidosis. Comorbidities include: Type 2 Diabetes, 8.8% A1c, HypertensionHe was discharged to home on 10/13/21without the need for home health services or durable medical equipment per the discharge summary.   Plan: If no return call from patient will plan return call in the next 3 weeks and follow case closure workflow.   Joylene Draft, RN, BSN  [place] Management Coordinator  [phone]- Mobile [phone]- Toll Free Main Office

## 2020-03-18 ENCOUNTER — Other Ambulatory Visit: Payer: Self-pay

## 2020-03-18 ENCOUNTER — Encounter: Payer: Self-pay | Admitting: Infectious Disease

## 2020-03-18 ENCOUNTER — Other Ambulatory Visit (HOSPITAL_COMMUNITY): Payer: Self-pay | Admitting: Internal Medicine

## 2020-03-18 ENCOUNTER — Ambulatory Visit (INDEPENDENT_AMBULATORY_CARE_PROVIDER_SITE_OTHER): Payer: 59 | Admitting: Infectious Disease

## 2020-03-18 VITALS — BP 154/80 | HR 85 | Temp 97.8°F | Wt 169.0 lb

## 2020-03-18 DIAGNOSIS — I2510 Atherosclerotic heart disease of native coronary artery without angina pectoris: Secondary | ICD-10-CM | POA: Diagnosis not present

## 2020-03-18 DIAGNOSIS — E1165 Type 2 diabetes mellitus with hyperglycemia: Secondary | ICD-10-CM | POA: Diagnosis not present

## 2020-03-18 DIAGNOSIS — B20 Human immunodeficiency virus [HIV] disease: Secondary | ICD-10-CM

## 2020-03-18 DIAGNOSIS — E1110 Type 2 diabetes mellitus with ketoacidosis without coma: Secondary | ICD-10-CM | POA: Diagnosis not present

## 2020-03-18 DIAGNOSIS — I2583 Coronary atherosclerosis due to lipid rich plaque: Secondary | ICD-10-CM | POA: Diagnosis not present

## 2020-03-18 DIAGNOSIS — E782 Mixed hyperlipidemia: Secondary | ICD-10-CM | POA: Diagnosis not present

## 2020-03-18 DIAGNOSIS — A5149 Other secondary syphilitic conditions: Secondary | ICD-10-CM

## 2020-03-18 MED ORDER — BICTEGRAVIR-EMTRICITAB-TENOFOV 50-200-25 MG PO TABS: 1.0000 | ORAL_TABLET | Freq: Every day | ORAL | 11 refills | Status: DC

## 2020-03-18 MED FILL — ROSUVASTATIN CALCIUM 40 MG: 40 | 90 days supply | Qty: 90 | Fill #0

## 2020-03-18 MED FILL — LISINOPRIL 10 MG TABS: 10 | 90 days supply | Qty: 90 | Fill #0

## 2020-03-18 NOTE — Progress Notes (Signed)
Subjective:  " What is that new drug that you can get once or twice a month there is control your HIV?  Patient ID: [name], male    DOB: April 24, 1966, 54 y.o.   MRN: [id number]  HPI  [name] is a 54 year old Caucasian man living with HIV that is perfectly controlled on Biktarvy.  He was recently admitted to [facility] and an [facility] with diabetic ketoacidosis.  This happened after his diabetes medication had been changed.  He does have a continuous glucose monitoring system in addition to his glucometer.  He is very much interested in [place].having seen TV commercial about it.  Past Medical History:  Diagnosis Date  . CAD (coronary artery disease)    DES.  /  ... Later (10/2007) intervention for in-stent restenosis ;  EF 60%... catheterization... June, 2009  . Chest pain   . Diabetes mellitus type 2, uncontrolled ([place]) 09/27/2014  . Diabetes mellitus type II   . DKA (diabetic ketoacidoses) 12/2010; 03/23/2017  . Dyslipidemia   . Ejection fraction    EF 60%, catheterization, 2009  . Gallbladder problem   . History of heart attack   . History of kidney stones   . HIV (human immunodeficiency virus infection) ([place]) dx'd ~ 2002/2003  . HLD (hyperlipidemia)   . HTN (hypertension)   . 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  . Nausea and vomiting 03/23/2017  . 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 RPR  reverted to negative  . Sinus tachycardia    mild at rest.... October, 2010  . Syphilitic hepatitis     Past Surgical History:  Procedure Laterality Date  . BIOPSY  12/28/2019   Procedure: BIOPSY;  Surgeon: Rogene Houston, MD;  [facility];  Service: Endoscopy;;  . CHOLECYSTECTOMY OPEN  07/2001  . COLONOSCOPY N/A 05/28/2017   Procedure: COLONOSCOPY;  Surgeon: Rogene Houston, MD;  [facility];  Service: Endoscopy;  Laterality: N/A;  200 -  pt to prep in Endo  . CORONARY ANGIOPLASTY WITH STENT PLACEMENT  03/2006; 10/2007;   . ESOPHAGEAL DILATION N/A 12/28/2019   Procedure: ESOPHAGEAL DILATION;  Surgeon: Rogene Houston, MD;  [facility];  Service: Endoscopy;  Laterality: N/A;  . ESOPHAGOGASTRODUODENOSCOPY N/A 12/28/2019   Procedure: ESOPHAGOGASTRODUODENOSCOPY (EGD);  Surgeon: Rogene Houston, MD;  [facility];  Service: Endoscopy;  Laterality: N/A;  125, per Lelon Frohlich moved to 10:55 pt aware  . HERNIA REPAIR    . LAPAROSCOPIC INCISIONAL / UMBILICAL / VENTRAL HERNIA REPAIR  07/2001   UHR  . POLYPECTOMY  05/28/2017   Procedure: POLYPECTOMY;  Surgeon: Rogene Houston, MD;  [facility];  Service: Endoscopy;;  colon    Family History  Problem Relation Age of Onset  . Heart attack Father        7 heart attacks  . CAD Father   . Alcoholism Father   . Hyperlipidemia Mother   . Hypertension Mother   . Anemia Other   . Diabetes Other   . CAD Sister   . Diabetes Sister       Social History   Socioeconomic History  . Marital status: Single    Spouse name: Not on file  . Number of children: 0  . Years of education: Not on file  . Highest education level: Not on file  Occupational History  . 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 Smoking status: Never Smoker  . Smokeless tobacco: Never Used  Vaping Use  . Vaping Use: Never used  Substance and Sexual Activity  . Alcohol use: Yes    Alcohol/week: 0.0 - 1.0 standard drinks    Comment: Social drinker, vodka, once a month  . Drug use: No  . Sexual activity: Not Currently    Partners: Male    Comment: declined condoms  Other Topics Concern  . Not on file  Social History Narrative  . Not on file   Social Determinants of Health   Financial Resource Strain:   . Difficulty of Paying Living Expenses: Not on file  Food Insecurity:   . Worried About Charity fundraiser in the Last Year: Not on  file  . Ran Out of Food in the Last Year: Not on file  Transportation Needs:   . Lack of Transportation (Medical): Not on file  . Lack of Transportation (Non-Medical): Not on file  Physical Activity:   . Days of Exercise per Week: Not on file  . Minutes of Exercise per Session: Not on file  Stress:   . Feeling of Stress : Not on file  Social Connections:   . Frequency of Communication with Friends and Family: Not on file  . Frequency of Social Gatherings with Friends and Family: Not on file  . Attends Religious Services: Not on file  . Active Member of Clubs or Organizations: Not on file  . Attends Archivist Meetings: Not on file  . Marital Status: Not on file    Allergies  Allergen Reactions  . Pepcid [Famotidine] Other (See Comments)    Contraindicated with ODEFSEY  . Prilosec [Omeprazole] Other (See Comments)    Contraindicated with RPV (lowers level of this ARV in ODEFSEY     Current Outpatient Medications:  .  aspirin 81 MG tablet, Take 1 tablet (81 mg total) by mouth daily with breakfast., Disp: 30 tablet, Rfl: 3 .  bictegravir-emtricitabine-tenofovir AF (BIKTARVY) 50-200-25 MG TABS tablet, Take 1 tablet by mouth daily., Disp: 30 tablet, Rfl: 11 .  chlorthalidone (HYGROTON) 25 MG tablet, Take 0.5 tablets (12.5 mg total) by mouth daily., Disp: , Rfl:  .  HUMALOG KWIKPEN 100 UNIT/ML KwikPen, Inject 11-15 Units into the skin 3 (three) times daily. Per sliding scale-patient monitored adding 1 unit after levels of 150, Disp: , Rfl:  .  insulin glargine (LANTUS) 100 UNIT/ML injection, Inject 0.38 mLs (38 Units total) into the skin at bedtime. Take 35 units at bedtime only for tonight (03/06/2020) and return to previous dose of 38 units at bedtime from tomorrow night (03/07/2020)., Disp: , Rfl:  .  lisinopril (ZESTRIL) 10 MG tablet, Take 1 tablet (10 mg total) by mouth daily., Disp: , Rfl:  .  metFORMIN (GLUCOPHAGE XR) 500 MG 24 hr tablet, Take 4 tablets (2,000 mg total)  by mouth at bedtime., Disp: , Rfl:  .  metoprolol succinate (TOPROL-XL) 100 MG 24 hr tablet, Take 100 mg by mouth daily. , Disp: , Rfl:  .  Multiple Vitamins-Minerals (MULTIVITAMIN WITH MINERALS) tablet, Take 1 tablet by mouth daily., Disp: , Rfl:  .  rosuvastatin (CRESTOR) 40 MG tablet, Take 1 tablet (40 mg total) by mouth daily., Disp: 90 tablet, Rfl: 3 .  Semaglutide, 1 MG/DOSE, (OZEMPIC, 1 MG/DOSE,) 4 MG/3ML SOPN, Inject 1 mg into the skin once a week., Disp: 9 mL, Rfl: 3 .  Vitamin D, Ergocalciferol, (DRISDOL)  1.25 MG (50000 UNIT) CAPS capsule, Take 1 capsule (50,000 Units total) by mouth 2 (two) times a week. Sunday & Wednesday, Disp: , Rfl:    Review of Systems  Constitutional: Negative for activity change, appetite change, chills, diaphoresis, fatigue, fever and unexpected weight change.  HENT: Negative for congestion, rhinorrhea, sinus pressure, sneezing, sore throat and trouble swallowing.   Eyes: Negative for photophobia and visual disturbance.  Respiratory: Negative for apnea, cough, chest tightness, shortness of breath, wheezing and stridor.   Cardiovascular: Negative for chest pain, palpitations and leg swelling.  Gastrointestinal: Negative for abdominal distention, abdominal pain, anal bleeding, blood in stool, constipation, diarrhea, nausea and vomiting.  Genitourinary: Negative for difficulty urinating, dysuria, flank pain and hematuria.  Musculoskeletal: Negative for arthralgias, back pain, gait problem, joint swelling and myalgias.  Skin: Negative for color change, pallor, rash and wound.  Neurological: Negative for dizziness, tremors, weakness and light-headedness.  Hematological: Negative for adenopathy. Does not bruise/bleed easily.  Psychiatric/Behavioral: Negative for agitation, behavioral problems, confusion, decreased concentration, dysphoric mood and sleep disturbance.       Objective:   Physical Exam Constitutional:      Appearance: He is well-developed.  HENT:      Head: Normocephalic and atraumatic.  Eyes:     Conjunctiva/sclera: Conjunctivae normal.  Cardiovascular:     Rate and Rhythm: Normal rate and regular rhythm.  Pulmonary:     Effort: Pulmonary effort is normal. No respiratory distress.     Breath sounds: No wheezing.  Abdominal:     General: There is no distension.     Palpations: Abdomen is soft.  Musculoskeletal:        General: No tenderness. Normal range of motion.     Cervical back: Normal range of motion and neck supple.  Skin:    General: Skin is warm and dry.     Coloration: Skin is not pale.     Findings: No erythema or rash.  Neurological:     General: No focal deficit present.     Mental Status: He is alert and oriented to person, place, and time.  Psychiatric:        Mood and Affect: Mood normal.        Behavior: Behavior normal.        Thought Content: Thought content normal.        Judgment: Judgment normal.           Assessment & Plan:  HIV disease: Check labs today continue Biktarvy but he would very much like to switch over to [place] and I will give his name to Cassie  DM: PCP.  Need for vaccinations he has had 3 doses of Pfizer and has had his flu shot.  Syphilis treated :

## 2020-03-19 LAB — T-HELPER CELL (CD4) - (RCID CLINIC ONLY)
CD4 % Helper T Cell: 47 % (ref 33–65)
CD4 T Cell Abs: 1066 /uL (ref 400–1790)

## 2020-03-20 LAB — COMPLETE METABOLIC PANEL WITH GFR
AG Ratio: 1.5 (calc) (ref 1.0–2.5)
ALT: 22 U/L (ref 9–46)
AST: 20 U/L (ref 10–35)
Albumin: 4.1 g/dL (ref 3.6–5.1)
Alkaline phosphatase (APISO): 67 U/L (ref 35–144)
BUN: 18 mg/dL (ref 7–25)
CO2: 26 mmol/L (ref 20–32)
Calcium: 9.6 mg/dL (ref 8.6–10.3)
Chloride: 101 mmol/L (ref 98–110)
Creat: 0.97 mg/dL (ref 0.70–1.33)
GFR, Est African American: 102 mL/min/{1.73_m2} (ref >=60)
GFR, Est Non African American: 88 mL/min/{1.73_m2} (ref >=60)
Globulin: 2.8 g/dL (calc) (ref 1.9–3.7)
Glucose, Bld: 166 mg/dL — ABNORMAL HIGH (ref 65–99)
Potassium: 4.3 mmol/L (ref 3.5–5.3)
Sodium: 136 mmol/L (ref 135–146)
Total Bilirubin: 0.7 mg/dL (ref 0.2–1.2)
Total Protein: 6.9 g/dL (ref 6.1–8.1)

## 2020-03-20 LAB — LIPID PANEL
Cholesterol: 194 mg/dL (ref <200)
HDL: 47 mg/dL (ref >=40)
LDL Cholesterol (Calc): 119 mg/dL (calc) — ABNORMAL HIGH
Non-HDL Cholesterol (Calc): 147 mg/dL (calc) — ABNORMAL HIGH (ref <130)
Total CHOL/HDL Ratio: 4.1 (calc) (ref <5.0)
Triglycerides: 163 mg/dL — ABNORMAL HIGH (ref <150)

## 2020-03-20 LAB — CBC WITH DIFFERENTIAL/PLATELET
Absolute Monocytes: 579 cells/uL (ref 200–950)
Basophils Absolute: 33 cells/uL (ref 0–200)
Basophils Relative: 0.5 %
Eosinophils Absolute: 59 cells/uL (ref 15–500)
Eosinophils Relative: 0.9 %
HCT: 43.3 % (ref 38.5–50.0)
Hemoglobin: 14.5 g/dL (ref 13.2–17.1)
Lymphs Abs: 2431 cells/uL (ref 850–3900)
MCH: 28.8 pg (ref 27.0–33.0)
MCHC: 33.5 g/dL (ref 32.0–36.0)
MCV: 86.1 fL (ref 80.0–100.0)
MPV: 8.6 fL (ref 7.5–12.5)
Monocytes Relative: 8.9 %
Neutro Abs: 3400 cells/uL (ref 1500–7800)
Neutrophils Relative %: 52.3 %
Platelets: 371 10*3/uL (ref 140–400)
RBC: 5.03 10*6/uL (ref 4.20–5.80)
RDW: 12.8 % (ref 11.0–15.0)
Total Lymphocyte: 37.4 %
WBC: 6.5 10*3/uL (ref 3.8–10.8)

## 2020-03-20 LAB — RPR: RPR Ser Ql: NONREACTIVE

## 2020-03-20 LAB — HIV-1 RNA QUANT-NO REFLEX-BLD
HIV 1 RNA Quant: <20 Copies/mL
HIV-1 RNA Quant, Log: <1.3 Log cps/mL

## 2020-03-26 MED FILL — BIKTARVY 50-200-25 MG TABS: 50-200-25 | 30 days supply | Qty: 30 | Fill #3

## 2020-04-03 ENCOUNTER — Other Ambulatory Visit (INDEPENDENT_AMBULATORY_CARE_PROVIDER_SITE_OTHER): Payer: Self-pay | Admitting: Bariatrics

## 2020-04-03 MED FILL — FREESTYLE LIBRE 14 DAY SENS: 84 days supply | Qty: 6 | Fill #0

## 2020-04-03 NOTE — Telephone Encounter (Signed)
Mrs. [name].

## 2020-04-03 NOTE — Telephone Encounter (Signed)
Left a patient a vmail

## 2020-04-03 NOTE — Telephone Encounter (Signed)
Dr.Brown 

## 2020-04-04 ENCOUNTER — Other Ambulatory Visit (INDEPENDENT_AMBULATORY_CARE_PROVIDER_SITE_OTHER): Payer: Self-pay | Admitting: Bariatrics

## 2020-04-04 DIAGNOSIS — E1169 Type 2 diabetes mellitus with other specified complication: Secondary | ICD-10-CM

## 2020-04-05 ENCOUNTER — Other Ambulatory Visit: Payer: Self-pay | Admitting: *Deleted

## 2020-04-05 NOTE — Patient Outreach (Signed)
[facility]) Care Management  04/05/2020  [name] Aug 27, 1965 [id number]   Transition of care /Case Closure Unsuccessful outreach    Referral received:03/06/20 Initial outreach:03/07/20 Insurance: UMR   #4 call attempt  Unable to complete post hospital discharge transition of care assessment. No return call from patient after 3 call attempts and no response to request to contact RN Care Coordinator in unsuccessful outreach letter mailed to home on 03/07/20/21.  Objective: Per the electronic medical record, Mr. Vangorder hospitalized at [facility] from 10/12=10/13/21with/for Diabetes Ketoacidosis. Comorbidities include: Type 2 Diabetes, 8.8% A1c, HypertensionHe was discharged to home on 10/13/21without the need for home health services or durable medical equipment per the discharge summary.    Plan Case closed to [place] care management services unsuccessful call attempts x 4.    Joylene Draft, RN, BSN  [place] Management Coordinator  [phone]- Mobile [phone]- Toll Free Main Office

## 2020-04-11 MED FILL — HUMALOG 100 UNITS/ML KWIKPE: 100 | 90 days supply | Qty: 90 | Fill #1

## 2020-04-15 ENCOUNTER — Encounter (INDEPENDENT_AMBULATORY_CARE_PROVIDER_SITE_OTHER): Payer: Self-pay

## 2020-04-22 MED FILL — BIKTARVY 50-200-25 MG TABS: 50-200-25 | 30 days supply | Qty: 30 | Fill #4

## 2020-04-29 ENCOUNTER — Ambulatory Visit: Payer: 59 | Admitting: Internal Medicine

## 2020-04-29 NOTE — Progress Notes (Deleted)
Patient ID: [name], male   DOB: 30-Jan-1966, 54 y.o.   MRN: [id number]   This visit occurred during the SARS-CoV-2 public health emergency.  Safety protocols were in place, including screening questions prior to the visit, additional usage of staff PPE, and extensive cleaning of exam room while observing appropriate contact time as indicated for disinfecting solutions.   HPI: [name] is a 54 y.o.-year-old male, referred by Asencion Noble, MD, for management of DM2, dx in 1996-2000, insulin-dependent since ~2001, uncontrolled, with CV complications (had 3 coronary stents - last 2011).  Last visit 2 months ago.  Reviewed his HbA1c levels: Lab Results  Component Value Date   HGBA1C 8.8 (H) 01/23/2020   HGBA1C 8.0 (H) 09/26/2019   HGBA1C 9.6 (H) 06/27/2019   HGBA1C 9.1 (H) 02/13/2019   HGBA1C 10.2 (H) 07/18/2018   HGBA1C 10.4 (H) 03/14/2018   HGBA1C 9.8 (A) 12/13/2017   HGBA1C 10.6 (H) 03/23/2017   HGBA1C 8.5 03/30/2009   In 02/2020, We changed his regimen as follows: - Metformin ER 1000 mg 2x a day, with meals >> 2000 mg with dinner - Ozempic 0.5 mg weekly >> 1 mg weekly - Lantus 45 units at bedtime >> 38 units with dinner - Humalog 11-15 units 3x a day, before meals - inconsistently while working (3 days a week) >>  10-12 units before b'fast 10-15 units before lunch 10-15 units before dinner He was on Novolog and Regular insulin. He has been on Trulicity >> severe fatigue, complete lack of appetite. Stopped 3 mo ago after a hospitalization. He tried [place] >> does not remember why he stopped.  Pt checks his sugars more than 4 times a day with his libre CGM. - am: 83-160, 200 - 2h after b'fast: n/c - before lunch: 40s, 60s-120 - 2h after lunch: n/c - before dinner: 100-225 (misses lunchtime Humalog) - 2h after dinner: n/c - bedtime: 150-250 - nighttime: n/c Lowest sugar was LO [employer]; he has hypoglycemia awareness in the 70s.  Highest sugar was 300s. He has a h/o DKA  x4, last 03/05/2020, with glucose was 1088.  Pt's meals are: - Breakfast: cereal (Special K protein) + banana; 2 eggs 4 [place] links - Lunch: salads or soups - Dinner: meat + veggies (green)  - Snacks: does not have a sweet tooth He exercises 1-2 times a week.  He continues to see: Weight management clinic -started more than a year ago  -No CKD, last BUN/creatinine:  Lab Results  Component Value Date   BUN 18 03/18/2020   BUN 27 (H) 03/06/2020   CREATININE 0.97 03/18/2020   CREATININE 1.18 03/06/2020  On lisinopril 10. He has a history of kidney stones.  -+ HL; last set of lipids: Lab Results  Component Value Date   CHOL 194 03/18/2020   HDL 47 03/18/2020   LDLCALC 119 (H) 03/18/2020   TRIG 163 (H) 03/18/2020   CHOLHDL 4.1 03/18/2020  On Crestor 40.  - last eye exam was fall 2020: No DR  - no numbness and tingling in his feet.  On ASA 81.  Pt has FH of DM in sister - dx'ed 63 y/o.  He also has a history of HTN, obesity, secondary syphilis, HIV infection.  ROS: Constitutional: no weight gain/no weight loss, no fatigue, no subjective hyperthermia, no subjective hypothermia Eyes: no blurry vision, no xerophthalmia ENT: no sore throat, no nodules palpated in neck, no dysphagia, no odynophagia, no hoarseness Cardiovascular: no CP/no SOB/no palpitations/no leg swelling  Respiratory: no cough/no SOB/no wheezing Gastrointestinal: no N/no V/no D/no C/no acid reflux Musculoskeletal: no muscle aches/no joint aches Skin: no rashes, no hair loss Neurological: no tremors/no numbness/no tingling/no dizziness  I reviewed pt's medications, allergies, PMH, social hx, family hx, and changes were documented in the history of present illness. Otherwise, unchanged from my initial visit note.  Past Medical History:  Diagnosis Date  . CAD (coronary artery disease)    DES.  /  ... Later (10/2007) intervention for in-stent restenosis ;  EF 60%... catheterization... June, 2009  . Chest  pain   . Diabetes mellitus type 2, uncontrolled ([place]) 09/27/2014  . Diabetes mellitus type II   . DKA (diabetic ketoacidoses) 12/2010; 03/23/2017  . Dyslipidemia   . Ejection fraction    EF 60%, catheterization, 2009  . Gallbladder problem   . History of heart attack   . History of kidney stones   . HIV (human immunodeficiency virus infection) ([place]) dx'd ~ 2002/2003  . HLD (hyperlipidemia)   . HTN (hypertension)   . 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  . Nausea and vomiting 03/23/2017  . 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 RPR  reverted to negative  . Sinus tachycardia    mild at rest.... October, 2010  . Syphilitic hepatitis    Past Surgical History:  Procedure Laterality Date  . BIOPSY  12/28/2019   Procedure: BIOPSY;  Surgeon: Rogene Houston, MD;  [facility];  Service: Endoscopy;;  . CHOLECYSTECTOMY OPEN  07/2001  . COLONOSCOPY N/A 05/28/2017   Procedure: COLONOSCOPY;  Surgeon: Rogene Houston, MD;  [facility];  Service: Endoscopy;  Laterality: N/A;  200 - pt to prep in Endo  . CORONARY ANGIOPLASTY WITH STENT PLACEMENT  03/2006; 10/2007;   . ESOPHAGEAL DILATION N/A 12/28/2019   Procedure: ESOPHAGEAL DILATION;  Surgeon: Rogene Houston, MD;  [facility];  Service: Endoscopy;  Laterality: N/A;  . ESOPHAGOGASTRODUODENOSCOPY N/A 12/28/2019   Procedure: ESOPHAGOGASTRODUODENOSCOPY (EGD);  Surgeon: Rogene Houston, MD;  [facility];  Service: Endoscopy;  Laterality: N/A;  125, per Lelon Frohlich moved to 10:55 pt aware  . HERNIA REPAIR    . LAPAROSCOPIC INCISIONAL / UMBILICAL / VENTRAL HERNIA REPAIR  07/2001   UHR  . POLYPECTOMY  05/28/2017   Procedure: POLYPECTOMY;  Surgeon: Rogene Houston, MD;  [facility];  Service: Endoscopy;;  colon   Social History   Socioeconomic History  . Marital status: Single    Spouse name: Not  on file  . Number of children: 0  . Years of education: Not on file  . Highest education level: Not on file  Occupational History  . 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 Smoking status: Never Smoker  . Smokeless tobacco: Never Used  Vaping Use  . Vaping Use: Never used  Substance and Sexual Activity  . Alcohol use: Yes    Alcohol/week: 0.0 - 1.0 standard drinks    Comment: Social drinker, vodka, once a month  . Drug use: No  . Sexual activity: Not Currently    Partners: Male    Comment: declined condoms  Other Topics Concern  . Not on file  Social History Narrative  . Not on file   Social Determinants of Health   Financial Resource Strain:   . Difficulty of Paying Living Expenses: Not on file  Food Insecurity:   . Worried About Charity fundraiser in the Last Year: Not on file  . Ran Out of Food in the Last Year: Not on file  Transportation Needs:   . Lack of Transportation (Medical): Not on file  . Lack of Transportation (Non-Medical): Not on file  Physical Activity:   . Days of Exercise per Week: Not on file  . Minutes of Exercise per Session: Not on file  Stress:   . Feeling of Stress : Not on file  Social Connections:   . Frequency of Communication with Friends and Family: Not on file  . Frequency of Social Gatherings with Friends and Family: Not on file  . Attends Religious Services: Not on file  . Active Member of Clubs or Organizations: Not on file  . Attends Archivist Meetings: Not on file  . Marital Status: Not on file  Intimate Partner Violence:   . Fear of Current or Ex-Partner: Not on file  . Emotionally Abused: Not on file  . Physically Abused: Not on file  . Sexually Abused: Not on file   Current Outpatient Medications on File Prior to Visit  Medication Sig Dispense Refill  . aspirin 81 MG tablet Take 1 tablet (81 mg total) by mouth daily with breakfast. 30 tablet 3  .  bictegravir-emtricitabine-tenofovir AF (BIKTARVY) 50-200-25 MG TABS tablet Take 1 tablet by mouth daily. 30 tablet 11  . chlorthalidone (HYGROTON) 25 MG tablet Take 0.5 tablets (12.5 mg total) by mouth daily.    [name] HUMALOG KWIKPEN 100 UNIT/ML KwikPen Inject 11-15 Units into the skin 3 (three) times daily. Per sliding scale-patient monitored adding 1 unit after levels of 150    . insulin glargine (LANTUS) 100 UNIT/ML injection Inject 0.38 mLs (38 Units total) into the skin at bedtime. Take 35 units at bedtime only for tonight (03/06/2020) and return to previous dose of 38 units at bedtime from tomorrow night (03/07/2020).    [name] lisinopril (ZESTRIL) 10 MG tablet Take 1 tablet (10 mg total) by mouth daily.    . metFORMIN (GLUCOPHAGE XR) 500 MG 24 hr tablet Take 4 tablets (2,000 mg total) by mouth at bedtime.    . metoprolol succinate (TOPROL-XL) 100 MG 24 hr tablet Take 100 mg by mouth daily.     . Multiple Vitamins-Minerals (MULTIVITAMIN WITH MINERALS) tablet Take 1 tablet by mouth daily.    . rosuvastatin (CRESTOR) 40 MG tablet Take 1 tablet (40 mg total) by mouth daily. 90 tablet 3  . Semaglutide, 1 MG/DOSE, (OZEMPIC, 1 MG/DOSE,) 4 MG/3ML SOPN Inject 1 mg into the skin once a week. 9 mL 3  . Vitamin D, Ergocalciferol, (DRISDOL) 1.25 MG (50000 UNIT) CAPS capsule Take 1 capsule (50,000 Units total) by mouth 2 (two) times a week. Sunday & Wednesday     No current facility-administered medications on file prior to visit.   Allergies  Allergen Reactions  . Pepcid [Famotidine] Other (See Comments)    Contraindicated with ODEFSEY  . Prilosec [Omeprazole] Other (See Comments)    Contraindicated with RPV (lowers level of this ARV in ODEFSEY   Family History  Problem Relation Age of Onset  . Heart attack Father        7 heart attacks  . CAD Father   . Alcoholism Father   . Hyperlipidemia Mother   . Hypertension Mother   . Anemia Other   . Diabetes Other   . CAD Sister   . Diabetes Sister  PE: There were no vitals taken for this visit. Wt Readings from Last 3 Encounters:  03/18/20 169 lb (76.7 kg)  03/05/20 192 lb 3.9 oz (87.2 kg)  02/27/20 198 lb (89.8 kg)   Constitutional: overweight, in NAD Eyes: PERRLA, EOMI, no exophthalmos ENT: moist mucous membranes, no thyromegaly, no cervical lymphadenopathy Cardiovascular: RRR, No MRG Respiratory: CTA B Gastrointestinal: abdomen soft, NT, ND, BS+ Musculoskeletal: no deformities, strength intact in all 4 Skin: moist, warm, no rashes Neurological: no tremor with outstretched hands, DTR normal in all 4  ASSESSMENT: 1. DM2, insulin-dependent, uncontrolled, with complications - CAD - s/p stents   PLAN:  1. Patient with long-standing, uncontrolled diabetes, on oral antidiabetic regimen, basal-bolus insulin regimen, and weekly GLP-1 receptor agonist, with still poor control.  Latest HbA1c was reviewed from 01/23/2020 and this was 8.8%. -At today's visit, we reviewed his blood sugars at home.  They are quite fluctuating in the morning, lower in the middle of the day if he is working that day and is active, and can be quite high before and after dinner especially if he misses his Humalog before lunch, which is happening when he is working.  He works 3 days a week.  We discussed about the importance of taking Humalog whenever he eats, ideally 15 minutes before every meal. -Since sugars are high in the morning we will move the entire dose of Metformin with dinner.  We will also increase the Ozempic to cover his meals better, while trying to decrease the Humalog with breakfast to avoid low blood sugars.  Also, to avoid lows (he had several 40s), will decrease the Lantus. -We also discussed about improving diet, which is high fat for now.  With an LDL cholesterol of 200 (however, he was inconsistent with Crestor at that time), I suggested a lower fat diet.  Discussed alternatives especially to breakfast but also given written instructions  about improving diet.  Discussed about the concept of insulin resistance and how to improve it.  He may need a referral to nutrition at next visit.  - I suggested to:  Patient Instructions  Please continue: - Metformin ER 2000 mg with dinner - Ozempic 1 mg weekly - Lantus 38 units at bedtime - Humalog (15 min before meals): 10-12 units before b'fast 10-15 units before lunch 10-15 units before dinner If sugars improve after increasing Ozempic, decrease Humalog doses further.  Please return in 3 months with your sugar log.   - we checked his HbA1c: 7%  - advised to check sugars at different times of the day - 1x a day, rotating check times - advised for yearly eye exams >> he is UTD - return to clinic in 3 months  Philemon Kingdom, MD PhD [facility]

## 2020-05-21 MED FILL — BIKTARVY 50-200-25 MG TABS: 50-200-25 | 30 days supply | Qty: 30 | Fill #5

## 2020-05-31 DIAGNOSIS — H524 Presbyopia: Secondary | ICD-10-CM | POA: Diagnosis not present

## 2020-05-31 DIAGNOSIS — Z135 Encounter for screening for eye and ear disorders: Secondary | ICD-10-CM | POA: Diagnosis not present

## 2020-05-31 DIAGNOSIS — H5213 Myopia, bilateral: Secondary | ICD-10-CM | POA: Diagnosis not present

## 2020-05-31 DIAGNOSIS — H52223 Regular astigmatism, bilateral: Secondary | ICD-10-CM | POA: Diagnosis not present

## 2020-06-04 ENCOUNTER — Encounter (INDEPENDENT_AMBULATORY_CARE_PROVIDER_SITE_OTHER): Payer: Self-pay | Admitting: Physician Assistant

## 2020-06-04 ENCOUNTER — Ambulatory Visit (INDEPENDENT_AMBULATORY_CARE_PROVIDER_SITE_OTHER): Payer: 59 | Admitting: Physician Assistant

## 2020-06-04 ENCOUNTER — Other Ambulatory Visit: Payer: Self-pay

## 2020-06-04 VITALS — BP 140/89 | HR 121 | Temp 98.7°F | Ht 66.0 in | Wt 196.0 lb

## 2020-06-04 DIAGNOSIS — Z794 Long term (current) use of insulin: Secondary | ICD-10-CM

## 2020-06-04 DIAGNOSIS — Z6830 Body mass index (BMI) 30.0-30.9, adult: Secondary | ICD-10-CM

## 2020-06-04 DIAGNOSIS — Z9189 Other specified personal risk factors, not elsewhere classified: Secondary | ICD-10-CM

## 2020-06-04 DIAGNOSIS — E669 Obesity, unspecified: Secondary | ICD-10-CM | POA: Diagnosis not present

## 2020-06-04 DIAGNOSIS — E7849 Other hyperlipidemia: Secondary | ICD-10-CM

## 2020-06-04 DIAGNOSIS — E1165 Type 2 diabetes mellitus with hyperglycemia: Secondary | ICD-10-CM | POA: Diagnosis not present

## 2020-06-04 MED ORDER — METFORMIN HCL ER 500 MG PO TB24: 2000.0000 mg | ORAL_TABLET | Freq: Every day | ORAL | Status: DC

## 2020-06-06 NOTE — Progress Notes (Signed)
Chief Complaint:   OBESITY [name] is here to discuss his progress with his obesity treatment plan along with follow-up of his obesity related diagnoses. [name] is on the Category 3 Plan and states he is following his eating plan approximately 75% of the time. [name] states he is walking for 30-40 minutes 3 times per week.  Today's visit was #: 101  Starting weight: 199 lbs Starting date: 03/14/2018 Today's weight: 191 lbs Today's date: 06/04/2020 Total lbs lost to date: 8 Total lbs lost since last in-office visit: 1  Interim History: [name] is having difficulty eating all the food on the plan due to lack of hunger from [place]. He is checking his blood sugars multiple times daily. He has a recent history of DKA with hospitalization. No adjustment in his medications were made at that time.  Subjective:   1. Type 2 diabetes mellitus with hyperglycemia, with long-term current use of insulin (HCC) [name] is on Ozempic and insulin. His fasting BGs range between 73 and 399. He denies hypoglycemia. He saw Dr. Cruzita Lederer and states it was not a good fit. He would like to find another Endocrinologist.   2. Other hyperlipidemia essiental hypertenison [name] is managed by Cardiology. His blood pressure is elevated today, and he denies headache or chest pain.  3. At risk for hypoglycemia [name] is at increased risk for hypoglycemia due to changes in diet, diagnosis of diabetes, and/or insulin use.   Assessment/Plan:   1. Type 2 diabetes mellitus with hyperglycemia, with long-term current use of insulin (HCC) Good blood sugar control is important to decrease the likelihood of diabetic complications such as nephropathy, neuropathy, limb loss, blindness, coronary artery disease, and death. Intensive lifestyle modification including diet, exercise and weight loss are the first line of treatment for diabetes. [name] will continue checking his BGs at least 4 times per day. We will refill metformin #120 for 1  month.  2. Other hyperlipidemia essiental hypertenison Cardiovascular risk and specific lipid/LDL goals reviewed. We discussed several lifestyle modifications today. [name] will continue to work on diet, exercise and weight loss efforts. Orders and follow up as documented in patient record.   Counseling Intensive lifestyle modifications are the first line treatment for this issue. . Dietary changes: Increase soluble fiber. Decrease simple carbohydrates. . Exercise changes: Moderate to vigorous-intensity aerobic activity 150 minutes per week if tolerated. . Lipid-lowering medications: see documented in medical record.  3. At risk for hypoglycemia [name] was given approximately 15 minutes of counseling today regarding prevention of hypoglycemia. He was advised of symptoms of hypoglycemia. [name] was instructed to avoid skipping meals, eat regular protein rich meals and schedule low calorie snacks as needed.   Repetitive spaced learning was employed today to elicit superior memory formation and behavioral change  4. Class 1 obesity with serious comorbidity and body mass index (BMI) of 30.0 to 30.9 in adult, unspecified obesity type [name] is currently in the action stage of change. As such, his goal is to continue with weight loss efforts. He has agreed to the Category 3 Plan.   Exercise goals: As is.  Behavioral modification strategies: increasing lean protein intake and no skipping meals.  [name] has agreed to follow-up with our clinic in 2 weeks. He was informed of the importance of frequent follow-up visits to maximize his success with intensive lifestyle modifications for his multiple health conditions.   Objective:   Blood pressure 140/89, pulse (!) 121, temperature 98.7 F (37.1 C), height 5\' 6"  (1.676 m), weight  196 lb (88.9 kg). Body mass index is 31.64 kg/m.  General: Cooperative, alert, well developed, in no acute distress. HEENT: Conjunctivae and lids  unremarkable. Cardiovascular: Regular rhythm.  Lungs: Normal work of breathing. Neurologic: No focal deficits.   Lab Results  Component Value Date   CREATININE 0.97 03/18/2020   BUN 18 03/18/2020   NA 136 03/18/2020   K 4.3 03/18/2020   CL 101 03/18/2020   CO2 26 03/18/2020   Lab Results  Component Value Date   ALT 22 03/18/2020   AST 20 03/18/2020   ALKPHOS 79 01/23/2020   BILITOT 0.7 03/18/2020   Lab Results  Component Value Date   HGBA1C 8.8 (H) 01/23/2020   HGBA1C 8.0 (H) 09/26/2019   HGBA1C 9.6 (H) 06/27/2019   HGBA1C 9.1 (H) 02/13/2019   HGBA1C 10.2 (H) 07/18/2018   No results found for: INSULIN Lab Results  Component Value Date   TSH 1.240 02/13/2019   Lab Results  Component Value Date   CHOL 194 03/18/2020   HDL 47 03/18/2020   LDLCALC 119 (H) 03/18/2020   TRIG 163 (H) 03/18/2020   CHOLHDL 4.1 03/18/2020   Lab Results  Component Value Date   WBC 6.5 03/18/2020   HGB 14.5 03/18/2020   HCT 43.3 03/18/2020   MCV 86.1 03/18/2020   PLT 371 03/18/2020   No results found for: IRON, TIBC, FERRITIN  Attestation Statements:   Reviewed by clinician on day of visit: allergies, medications, problem list, medical history, surgical history, family history, social history, and previous encounter notes.   Wilhemena Durie, am acting as transcriptionist for [employer], PA-C.  I have reviewed the above documentation for accuracy and completeness, and I agree with the above. Abby Potash, PA-C

## 2020-06-13 ENCOUNTER — Other Ambulatory Visit (INDEPENDENT_AMBULATORY_CARE_PROVIDER_SITE_OTHER): Payer: Self-pay | Admitting: Bariatrics

## 2020-06-13 DIAGNOSIS — E1169 Type 2 diabetes mellitus with other specified complication: Secondary | ICD-10-CM

## 2020-06-13 NOTE — Telephone Encounter (Signed)
This patient was last seen by Abby Potash, PA-C and currently has an upcoming appt scheduled on 06/24/20 with Dr. Juleen China.

## 2020-06-17 MED FILL — BIKTARVY 50-200-25 MG TABS: 50-200-25 | 30 days supply | Qty: 30 | Fill #6

## 2020-06-17 MED FILL — metFORMIN HCL ER 500 MG TB2: 500 | 30 days supply | Qty: 120 | Fill #0

## 2020-06-18 DIAGNOSIS — I2510 Atherosclerotic heart disease of native coronary artery without angina pectoris: Secondary | ICD-10-CM | POA: Diagnosis not present

## 2020-06-18 DIAGNOSIS — E1122 Type 2 diabetes mellitus with diabetic chronic kidney disease: Secondary | ICD-10-CM | POA: Diagnosis not present

## 2020-06-24 ENCOUNTER — Ambulatory Visit (INDEPENDENT_AMBULATORY_CARE_PROVIDER_SITE_OTHER): Payer: 59 | Admitting: Family Medicine

## 2020-06-24 ENCOUNTER — Encounter (INDEPENDENT_AMBULATORY_CARE_PROVIDER_SITE_OTHER): Payer: Self-pay

## 2020-06-28 MED FILL — BASAGLAR 100 UNIT/ML KWIKPE: 100 | 28 days supply | Qty: 30 | Fill #3

## 2020-06-28 NOTE — Telephone Encounter (Signed)
Patient is scheduled with Dr. Domenic Polite 09/09/20

## 2020-07-05 MED FILL — FREESTYLE LIBRE 14 DAY SENS: 84 days supply | Qty: 6 | Fill #1

## 2020-07-15 MED FILL — BIKTARVY 50-200-25 MG TABS: 50-200-25 | 30 days supply | Qty: 30 | Fill #7

## 2020-08-13 ENCOUNTER — Other Ambulatory Visit (HOSPITAL_COMMUNITY): Payer: Self-pay | Admitting: Internal Medicine

## 2020-08-14 MED FILL — HUMALOG 100 UNITS/ML KWIKPE: 100 | 90 days supply | Qty: 90 | Fill #2

## 2020-08-19 ENCOUNTER — Other Ambulatory Visit (HOSPITAL_COMMUNITY): Payer: Self-pay | Admitting: Internal Medicine

## 2020-08-19 MED FILL — BASAGLAR 100 UNIT/ML KWIKPE: 100 | 28 days supply | Qty: 30 | Fill #0

## 2020-08-22 ENCOUNTER — Other Ambulatory Visit (HOSPITAL_COMMUNITY): Payer: Self-pay

## 2020-08-29 ENCOUNTER — Other Ambulatory Visit (HOSPITAL_COMMUNITY): Payer: Self-pay

## 2020-09-01 ENCOUNTER — Encounter (INDEPENDENT_AMBULATORY_CARE_PROVIDER_SITE_OTHER): Payer: Self-pay

## 2020-09-02 ENCOUNTER — Ambulatory Visit (INDEPENDENT_AMBULATORY_CARE_PROVIDER_SITE_OTHER): Payer: 59 | Admitting: Physician Assistant

## 2020-09-04 ENCOUNTER — Other Ambulatory Visit (HOSPITAL_COMMUNITY): Payer: Self-pay

## 2020-09-04 MED FILL — Bictegravir-Emtricitabine-Tenofovir AF Tab 50-200-25 MG: ORAL | 30 days supply | Qty: 30 | Fill #0 | Status: AC

## 2020-09-09 ENCOUNTER — Ambulatory Visit: Payer: 59 | Admitting: Cardiology

## 2020-09-09 ENCOUNTER — Other Ambulatory Visit (HOSPITAL_COMMUNITY): Payer: Self-pay

## 2020-09-09 ENCOUNTER — Encounter: Payer: Self-pay | Admitting: Cardiology

## 2020-09-09 ENCOUNTER — Other Ambulatory Visit: Payer: Self-pay

## 2020-09-09 VITALS — BP 200/104 | HR 100 | Ht 66.0 in | Wt 205.6 lb

## 2020-09-09 DIAGNOSIS — I25119 Atherosclerotic heart disease of native coronary artery with unspecified angina pectoris: Secondary | ICD-10-CM | POA: Diagnosis not present

## 2020-09-09 DIAGNOSIS — E782 Mixed hyperlipidemia: Secondary | ICD-10-CM

## 2020-09-09 DIAGNOSIS — I10 Essential (primary) hypertension: Secondary | ICD-10-CM | POA: Diagnosis not present

## 2020-09-09 MED ORDER — LISINOPRIL 10 MG PO TABS
ORAL_TABLET | Freq: Every day | ORAL | 3 refills | Status: DC
  Filled 2020-09-09: qty 90, 90d supply, fill #0
  Filled 2020-12-30: qty 90, 90d supply, fill #1
  Filled 2021-04-14: qty 90, 90d supply, fill #2

## 2020-09-09 MED ORDER — CHLORTHALIDONE 25 MG PO TABS: 12.5000 mg | ORAL_TABLET | Freq: Every day | ORAL | 3 refills | Status: DC

## 2020-09-09 MED ORDER — METOPROLOL SUCCINATE ER 100 MG PO TB24
100.0000 mg | ORAL_TABLET | Freq: Every day | ORAL | 3 refills | Status: DC
  Filled 2020-09-09: qty 90, 90d supply, fill #0
  Filled 2020-12-30: qty 90, 90d supply, fill #1
  Filled 2021-04-08: qty 90, 90d supply, fill #2
  Filled 2021-04-14: qty 90, 90d supply, fill #3

## 2020-09-09 MED ORDER — ROSUVASTATIN CALCIUM 40 MG PO TABS
40.0000 mg | ORAL_TABLET | Freq: Every day | ORAL | 3 refills | Status: DC
  Filled 2020-09-09: qty 90, 90d supply, fill #0
  Filled 2020-12-30: qty 90, 90d supply, fill #1
  Filled 2021-04-14: qty 90, 90d supply, fill #2

## 2020-09-09 NOTE — Progress Notes (Signed)
Cardiology Office Note  Date: 09/09/2020   ID: [name], [name] Dec 24, 1965, MRN [id number]  PCP:  Asencion Noble, MD  Cardiologist:  Rozann Lesches, MD Electrophysiologist:  None   Chief Complaint  Patient presents with  . Cardiac follow-up    History of Present Illness: [name] is a 55 y.o. male former patient of Dr. Ron [name] and more recently Dr. Meda Coffee now presenting to establish follow-up with me.  I reviewed his records and updated the chart.  He was last seen by Ms. Ladoris Gene in February 2020.  He presents today for a routine visit.  He does not report any obvious angina symptoms at this time.  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  No regular exercise plan at this time, but he does enjoy walking his two Huskies on his days off.  We went over his medications.  He has been out of Toprol-XL, chlorthalidone, lisinopril, and Crestor since October of last year.  Blood pressure significantly elevated today.  I did review his last lipid panel as noted below.  Past Medical History:  Diagnosis Date  . 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 Diabetes mellitus type II   . Essential hypertension   . History of heart attack   . History of kidney stones   . HIV (human immunodeficiency virus infection) ([place])    Diagnosed 2002  . LFTs abnormal   . Mixed hyperlipidemia   . 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  . Syphilitic hepatitis   . Type 2 diabetes mellitus ([place])     Past Surgical History:  Procedure Laterality Date  . BIOPSY  12/28/2019   Procedure: BIOPSY;  Surgeon: Rogene Houston, MD;  [facility];  Service: Endoscopy;;  . CHOLECYSTECTOMY OPEN  07/2001  . COLONOSCOPY N/A 05/28/2017   Procedure: COLONOSCOPY;  Surgeon: Rogene Houston, MD;  [facility];  Service: Endoscopy;  Laterality: N/A;   200 - pt to prep in Endo  . CORONARY ANGIOPLASTY WITH STENT PLACEMENT  03/2006; 10/2007;   . ESOPHAGEAL DILATION N/A 12/28/2019   Procedure: ESOPHAGEAL DILATION;  Surgeon: Rogene Houston, MD;  [facility];  Service: Endoscopy;  Laterality: N/A;  . ESOPHAGOGASTRODUODENOSCOPY N/A 12/28/2019   Procedure: ESOPHAGOGASTRODUODENOSCOPY (EGD);  Surgeon: Rogene Houston, MD;  [facility];  Service: Endoscopy;  Laterality: N/A;  125, per Lelon Frohlich moved to 10:55 pt aware  . HERNIA REPAIR    . LAPAROSCOPIC INCISIONAL / UMBILICAL / VENTRAL HERNIA REPAIR  07/2001   UHR  . POLYPECTOMY  05/28/2017   Procedure: POLYPECTOMY;  Surgeon: Rogene Houston, MD;  [facility];  Service: Endoscopy;;  colon    Medications: Reviewed  Allergies:  Pepcid [famotidine] and Prilosec [omeprazole]   ROS: No palpitations or syncope.  Physical Exam: VS:  BP (!) 200/104   Pulse 100   Ht 5\' 6"  (1.676 m)   Wt 205 lb 9.6 oz (93.3 kg)   SpO2 97%   BMI 33.18 kg/m , BMI Body mass index is 33.18 kg/m.  Wt Readings from Last 3 Encounters:  09/09/20 205 lb 9.6 oz (93.3 kg)  06/04/20 196 lb (88.9 kg)  03/18/20 169 lb (76.7 kg)    General: Patient appears comfortable at rest. HEENT: Conjunctiva and lids normal, wearing a mask. Neck:  Supple, no elevated JVP or carotid bruits, no thyromegaly. Lungs: Clear to auscultation, nonlabored breathing at rest. Cardiac: Regular rate and rhythm, no S3 or significant systolic murmur, no pericardial rub. Extremities: No pitting edema.  ECG:  An ECG dated 03/05/2020 was personally reviewed today and demonstrated:  Sinus tachycardia with lead motion artifact, decreased R wave progression.  Recent Labwork: 03/18/2020: ALT 22; AST 20; BUN 18; Creat 0.97; Hemoglobin 14.5; Platelets 371; Potassium 4.3; Sodium 136     Component Value Date/Time   CHOL 194 03/18/2020 0954   CHOL 281 (H) 01/23/2020 0814   TRIG 163 (H) 03/18/2020 0954   HDL 47 03/18/2020 0954    HDL 41 01/23/2020 0814   CHOLHDL 4.1 03/18/2020 0954   VLDL 59 (H) 10/28/2015 0957   LDLCALC 119 (H) 03/18/2020 0954    Other Studies Reviewed Today:  Echocardiogram 07/10/2011: - Left ventricle: The cavity size was normal. Wall thickness  was normal. Systolic function was normal. The estimated  ejection fraction was in the range of 55% to 60%. Wall  motion was normal; there were no regional wall motion  abnormalities. Features are consistent with a pseudonormal  left ventricular filling pattern, with concomitant  abnormal relaxation and increased filling pressure (grade  2 diastolic dysfunction).  - Atrial septum: No defect or patent foramen ovale was  identified.    Assessment and Plan:  1.  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  He is clinically stable without active angina at this time.  No recent ischemic testing, last ECG noted above.  For now plan will be to resume regular medical therapy.  Continue aspirin. Refilling Toprol-XL, lisinopril, chlorthalidone, Toprol-XL, and Crestor.  2.  Essential hypertension, blood pressure significantly elevated in the absence of regular medical therapy.  Resume Toprol-XL, lisinopril, and chlorthalidone at prior doses.  He has an automatic blood pressure cuff to check measurements at home.  Further adjustments can be made if needed.  3.  Mixed hyperlipidemia with history of severe hypertriglyceridemia.  Resume Crestor 40 mg daily which he was previously tolerating.  Initiation of Zetia was discussed by APP at last visit although not initiated.  Recheck FLP and LFTs for next encounter and make adjustments from there.  Medication Adjustments/Labs and Tests Ordered: Current medicines are reviewed at length with the patient today.  Concerns regarding medicines are outlined above.   Tests Ordered: Orders Placed This Encounter  Procedures  . Lipid panel  . Hepatic function panel     Medication Changes: Meds ordered this encounter  Medications  . chlorthalidone (HYGROTON) 25 MG tablet    Sig: Take 0.5 tablets (12.5 mg total) by mouth daily.    Dispense:  45 tablet    Refill:  3  . lisinopril (ZESTRIL) 10 MG tablet    Sig: TAKE 1 TABLET BY MOUTH EVERY DAY    Dispense:  90 tablet    Refill:  3  . rosuvastatin (CRESTOR) 40 MG tablet    Sig: Take 1 tablet (40 mg total) by mouth daily.    Dispense:  90 tablet    Refill:  3  . metoprolol succinate (TOPROL-XL) 100 MG 24 hr tablet    Sig: Take 1 tablet (100 mg total) by mouth daily.    Dispense:  90 tablet    Refill:  3    Disposition:  Follow up 3 months in the [place] office.  Signed, Satira Sark, MD, [facility] 09/09/2020 11:54  [place] [facility] [place], [place], [place] [address] Phone: [phone]; Fax: [phone]

## 2020-09-09 NOTE — Patient Instructions (Signed)
Medication Instructions:  Continue all current medications.  Labwork:  FLP, LFT - orders given today - please do just prior to next visit  Reminder:  Nothing to eat or drink after 12 midnight prior to labs.  Office will contact with results via phone or letter.    Testing/Procedures: none  Follow-Up: 3 months   Any Other Special Instructions Will Be Listed Below (If Applicable).  If you need a refill on your cardiac medications before your next appointment, please call your pharmacy.

## 2020-09-10 ENCOUNTER — Other Ambulatory Visit (HOSPITAL_COMMUNITY): Payer: Self-pay

## 2020-09-16 DIAGNOSIS — E1122 Type 2 diabetes mellitus with diabetic chronic kidney disease: Secondary | ICD-10-CM | POA: Diagnosis not present

## 2020-09-16 DIAGNOSIS — I2510 Atherosclerotic heart disease of native coronary artery without angina pectoris: Secondary | ICD-10-CM | POA: Diagnosis not present

## 2020-09-20 ENCOUNTER — Other Ambulatory Visit (HOSPITAL_COMMUNITY): Payer: Self-pay

## 2020-09-20 MED FILL — Continuous Glucose System Sensor: 28 days supply | Qty: 2 | Fill #0 | Status: AC

## 2020-09-23 ENCOUNTER — Other Ambulatory Visit: Payer: Self-pay

## 2020-09-23 ENCOUNTER — Ambulatory Visit (INDEPENDENT_AMBULATORY_CARE_PROVIDER_SITE_OTHER): Payer: 59 | Admitting: Physician Assistant

## 2020-09-23 ENCOUNTER — Encounter (INDEPENDENT_AMBULATORY_CARE_PROVIDER_SITE_OTHER): Payer: Self-pay | Admitting: Physician Assistant

## 2020-09-23 VITALS — BP 155/96 | HR 94 | Temp 98.2°F | Ht 66.0 in | Wt 199.0 lb

## 2020-09-23 DIAGNOSIS — E7849 Other hyperlipidemia: Secondary | ICD-10-CM | POA: Diagnosis not present

## 2020-09-23 DIAGNOSIS — E1165 Type 2 diabetes mellitus with hyperglycemia: Secondary | ICD-10-CM

## 2020-09-23 DIAGNOSIS — Z794 Long term (current) use of insulin: Secondary | ICD-10-CM | POA: Diagnosis not present

## 2020-09-23 DIAGNOSIS — Z9189 Other specified personal risk factors, not elsewhere classified: Secondary | ICD-10-CM | POA: Diagnosis not present

## 2020-09-23 DIAGNOSIS — E669 Obesity, unspecified: Secondary | ICD-10-CM | POA: Diagnosis not present

## 2020-09-23 DIAGNOSIS — E559 Vitamin D deficiency, unspecified: Secondary | ICD-10-CM

## 2020-09-23 DIAGNOSIS — Z6832 Body mass index (BMI) 32.0-32.9, adult: Secondary | ICD-10-CM

## 2020-09-23 MED ORDER — VITAMIN D (ERGOCALCIFEROL) 1.25 MG (50000 UNIT) PO CAPS: 50000.0000 [IU] | ORAL_CAPSULE | ORAL | Status: DC

## 2020-09-24 LAB — COMPREHENSIVE METABOLIC PANEL
ALT: 27 IU/L (ref 0–44)
AST: 22 IU/L (ref 0–40)
Albumin/Globulin Ratio: 1.3 (ref 1.2–2.2)
Albumin: 4.3 g/dL (ref 3.8–4.9)
Alkaline Phosphatase: 76 IU/L (ref 44–121)
BUN/Creatinine Ratio: 21 — ABNORMAL HIGH (ref 9–20)
BUN: 17 mg/dL (ref 6–24)
Bilirubin Total: 0.3 mg/dL (ref 0.0–1.2)
CO2: 21 mmol/L (ref 20–29)
Calcium: 10.2 mg/dL (ref 8.7–10.2)
Chloride: 101 mmol/L (ref 96–106)
Creatinine, Ser: 0.81 mg/dL (ref 0.76–1.27)
Globulin, Total: 3.2 g/dL (ref 1.5–4.5)
Glucose: 72 mg/dL (ref 65–99)
Potassium: 4.4 mmol/L (ref 3.5–5.2)
Sodium: 142 mmol/L (ref 134–144)
Total Protein: 7.5 g/dL (ref 6.0–8.5)
eGFR: 104 mL/min/{1.73_m2} (ref >59)

## 2020-09-24 LAB — INSULIN, RANDOM: INSULIN: 2.2 u[IU]/mL — ABNORMAL LOW (ref 2.6–24.9)

## 2020-09-24 LAB — LIPID PANEL
Chol/HDL Ratio: 4.1 ratio (ref 0.0–5.0)
Cholesterol, Total: 186 mg/dL (ref 100–199)
HDL: 45 mg/dL (ref >39)
LDL Chol Calc (NIH): 104 mg/dL — ABNORMAL HIGH (ref 0–99)
Triglycerides: 216 mg/dL — ABNORMAL HIGH (ref 0–149)
VLDL Cholesterol Cal: 37 mg/dL (ref 5–40)

## 2020-09-24 LAB — HEMOGLOBIN A1C
Est. average glucose Bld gHb Est-mCnc: 217 mg/dL
Hgb A1c MFr Bld: 9.2 % — ABNORMAL HIGH (ref 4.8–5.6)

## 2020-09-24 LAB — VITAMIN D 25 HYDROXY (VIT D DEFICIENCY, FRACTURES): Vit D, 25-Hydroxy: 27.4 ng/mL — ABNORMAL LOW (ref 30.0–100.0)

## 2020-09-24 NOTE — Progress Notes (Signed)
Chief Complaint:   OBESITY [name] is here to discuss his progress with his obesity treatment plan along with follow-up of his obesity related diagnoses. [name] is on the Category 3 Plan and states he is following his eating plan approximately 0% of the time. [name] states he is walking for 30-45 minutes 3 times per week.  Today's visit was #: 30 Starting weight: 199 lbs Starting date: 03/14/2018 Today's weight: 199 lbs Today's date: 09/24/2020 Total lbs lost to date: 0 Total lbs lost since last in-office visit: 0  Interim History: [name] reports that he has not been following the plan, but he has been eating more protein. He is traveling to the beach next week.  Subjective:   1. Type 2 diabetes mellitus with hyperglycemia, with long-term current use of insulin ([name]) [name] is on Lantus, Tresiba, Basaglar, Humalog, metformin, and Ozempic. He is managed by Dr. Willey Blade. He reports that he becomes hypoglycemic in the middle of the night or when he skips meals.  2. Vitamin D deficiency [name] is on Vit D, and he denies nausea, vomiting, or muscle weakness.  3. At risk for heart disease [name] is at a higher than average risk for cardiovascular disease due to obesity.   Assessment/Plan:   1. Type 2 diabetes mellitus with hyperglycemia, with long-term current use of insulin (HCC) We will check labs today, and [name] will continue his medications. Good blood sugar control is important to decrease the likelihood of diabetic complications such as nephropathy, neuropathy, limb loss, blindness, coronary artery disease, and death. Intensive lifestyle modification including diet, exercise and weight loss are the first line of treatment for diabetes.   - Comprehensive metabolic panel - Hemoglobin A1c - Insulin, random - Lipid panel  2. Vitamin D deficiency Low Vitamin D level contributes to fatigue and are associated with obesity, breast, and colon cancer. We will check labs today, and we will  refill prescription Vitamin D for 1 month. [name] will follow-up for routine testing of Vitamin D, at least 2-3 times per year to avoid over-replacement.  - Vitamin D, Ergocalciferol, (DRISDOL) 1.25 MG (50000 UNIT) CAPS capsule; Take 1 capsule (50,000 Units total) by mouth 2 (two) times a week. Sunday & Wednesday  Dispense: 5 capsule - VITAMIN D 25 Hydroxy (Vit-D Deficiency, Fractures)  3. At risk for heart disease [name] was given approximately 15 minutes of coronary artery disease prevention counseling today. He is 55 y.o. male and has risk factors for heart disease including obesity. We discussed intensive lifestyle modifications today with an emphasis on specific weight loss instructions and strategies.   Repetitive spaced learning was employed today to elicit superior memory formation and behavioral change.  4. Class 1 obesity with serious comorbidity and body mass index (BMI) of 32.0 to 32.9 in adult, unspecified obesity type [name] is currently in the action stage of change. As such, his goal is to continue with weight loss efforts. He has agreed to the Category 3 Plan.   Exercise goals: As is.  Behavioral modification strategies: increasing lean protein intake, meal planning and cooking strategies and travel eating strategies.  [name] has agreed to follow-up with our clinic in 3 weeks. He was informed of the importance of frequent follow-up visits to maximize his success with intensive lifestyle modifications for his multiple health conditions.   [name] was informed we would discuss his lab results at his next visit unless there is a critical issue that needs to be addressed sooner. [name] agreed to keep his  next visit at the agreed upon time to discuss these results.  Objective:   Blood pressure (!) 155/96, pulse 94, temperature 98.2 F (36.8 C), height 5\' 6"  (1.676 m), weight 199 lb (90.3 kg), SpO2 95 %. Body mass index is 32.12 kg/m.  General: Cooperative, alert, well developed, in  no acute distress. HEENT: Conjunctivae and lids unremarkable. Cardiovascular: Regular rhythm.  Lungs: Normal work of breathing. Neurologic: No focal deficits.   Lab Results  Component Value Date   CREATININE 0.81 09/23/2020   BUN 17 09/23/2020   NA 142 09/23/2020   K 4.4 09/23/2020   CL 101 09/23/2020   CO2 21 09/23/2020   Lab Results  Component Value Date   ALT 27 09/23/2020   AST 22 09/23/2020   ALKPHOS 76 09/23/2020   BILITOT 0.3 09/23/2020   Lab Results  Component Value Date   HGBA1C 9.2 (H) 09/23/2020   HGBA1C 8.8 (H) 01/23/2020   HGBA1C 8.0 (H) 09/26/2019   HGBA1C 9.6 (H) 06/27/2019   HGBA1C 9.1 (H) 02/13/2019   Lab Results  Component Value Date   INSULIN WILL FOLLOW 09/23/2020   Lab Results  Component Value Date   TSH 1.240 02/13/2019   Lab Results  Component Value Date   CHOL 186 09/23/2020   HDL 45 09/23/2020   LDLCALC 104 (H) 09/23/2020   TRIG 216 (H) 09/23/2020   CHOLHDL 4.1 09/23/2020   Lab Results  Component Value Date   WBC 6.5 03/18/2020   HGB 14.5 03/18/2020   HCT 43.3 03/18/2020   MCV 86.1 03/18/2020   PLT 371 03/18/2020   No results found for: IRON, TIBC, FERRITIN  Attestation Statements:   Reviewed by clinician on day of visit: allergies, medications, problem list, medical history, surgical history, family history, social history, and previous encounter notes.   Wilhemena Durie, am acting as transcriptionist for [employer], PA-C.  I have reviewed the above documentation for accuracy and completeness, and I agree with the above. Abby Potash, PA-C

## 2020-09-26 ENCOUNTER — Other Ambulatory Visit (HOSPITAL_COMMUNITY): Payer: Self-pay

## 2020-09-26 MED FILL — Insulin Lispro Soln Pen-injector 100 Unit/ML (1 Unit Dial): SUBCUTANEOUS | 90 days supply | Qty: 90 | Fill #0 | Status: CN

## 2020-10-08 ENCOUNTER — Other Ambulatory Visit (HOSPITAL_COMMUNITY): Payer: Self-pay

## 2020-10-08 MED FILL — Bictegravir-Emtricitabine-Tenofovir AF Tab 50-200-25 MG: ORAL | 30 days supply | Qty: 30 | Fill #1 | Status: AC

## 2020-10-09 ENCOUNTER — Other Ambulatory Visit (HOSPITAL_COMMUNITY): Payer: Self-pay | Admitting: Internal Medicine

## 2020-10-09 ENCOUNTER — Other Ambulatory Visit (HOSPITAL_COMMUNITY): Payer: Self-pay

## 2020-10-09 MED FILL — Insulin Glargine Soln Pen-Injector 100 Unit/ML: SUBCUTANEOUS | 42 days supply | Qty: 30 | Fill #0 | Status: CN

## 2020-10-10 ENCOUNTER — Other Ambulatory Visit (HOSPITAL_COMMUNITY): Payer: Self-pay

## 2020-10-14 ENCOUNTER — Other Ambulatory Visit: Payer: Self-pay

## 2020-10-14 ENCOUNTER — Encounter (INDEPENDENT_AMBULATORY_CARE_PROVIDER_SITE_OTHER): Payer: Self-pay | Admitting: Physician Assistant

## 2020-10-14 ENCOUNTER — Ambulatory Visit (INDEPENDENT_AMBULATORY_CARE_PROVIDER_SITE_OTHER): Payer: 59 | Admitting: Physician Assistant

## 2020-10-14 ENCOUNTER — Other Ambulatory Visit (HOSPITAL_COMMUNITY): Payer: Self-pay

## 2020-10-14 VITALS — BP 150/89 | HR 108 | Temp 98.0°F | Ht 66.0 in | Wt 199.0 lb

## 2020-10-14 DIAGNOSIS — E1165 Type 2 diabetes mellitus with hyperglycemia: Secondary | ICD-10-CM

## 2020-10-14 DIAGNOSIS — E66811 Obesity, class 1: Secondary | ICD-10-CM

## 2020-10-14 DIAGNOSIS — Z9189 Other specified personal risk factors, not elsewhere classified: Secondary | ICD-10-CM

## 2020-10-14 DIAGNOSIS — Z794 Long term (current) use of insulin: Secondary | ICD-10-CM | POA: Diagnosis not present

## 2020-10-14 DIAGNOSIS — Z6832 Body mass index (BMI) 32.0-32.9, adult: Secondary | ICD-10-CM

## 2020-10-14 DIAGNOSIS — E669 Obesity, unspecified: Secondary | ICD-10-CM

## 2020-10-14 MED ORDER — OZEMPIC (2 MG/DOSE) 8 MG/3ML ~~LOC~~ SOPN
2.0000 mg | PEN_INJECTOR | SUBCUTANEOUS | 0 refills | Status: DC
Start: 2020-10-14 — End: 2021-03-19
  Filled 2020-10-14: qty 9, 84d supply, fill #0

## 2020-10-14 NOTE — Progress Notes (Signed)
Chief Complaint:   OBESITY [name] is here to discuss his progress with his obesity treatment plan along with follow-up of his obesity related diagnoses. [name] is on the Category 3 Plan and states he is following his eating plan approximately 40% of the time. [name] states he is walking for 30 minutes 3 times per week.  Today's visit was #: 53 Starting weight: 199 lbs Starting date: 03/14/2018 Today's weight: 199 lbs Today's date: 10/14/2020 Total lbs lost to date: 0 Total lbs lost since last in-office visit: 0  Interim History: [name] reports that he was on vacation for a week. He has been without his Basaglar insulin for 5 days due to lack of insurance coverage. His blood sugars have been elevated. He has been eating low carb for the last week.  Subjective:   1. Type 2 diabetes mellitus with hyperglycemia, with long-term current use of insulin (HCC) Sanav has been without his Basaglar insulin for 5 days due to lack of insurance approval. His fasting BGs are now averaging between 245-350. He is trying to get in touch with his primary care physician. He denies hypoglycemia.  2. At risk for side effect of medication [name] is at risk for drug side effects due to increased Ozempic dose.  Assessment/Plan:   1. Type 2 diabetes mellitus with hyperglycemia, with long-term current use of insulin (HCC) [name] will follow up with Dr. Willey Blade in regards to his insulin. [place] agreed to increase Ozempic to 2 mg weekly with no refills. Good blood sugar control is important to decrease the likelihood of diabetic complications such as nephropathy, neuropathy, limb loss, blindness, coronary artery disease, and death. Intensive lifestyle modification including diet, exercise and weight loss are the first line of treatment for diabetes.   - Semaglutide, 2 MG/DOSE, (OZEMPIC, 2 MG/DOSE,) 8 MG/3ML SOPN; Inject 2 mg into the skin once a week.  Dispense: 9 mL; Refill: 0  2. At risk for side effect of  medication [name] was given approximately 15 minutes of drug side effect counseling today.  We discussed side effect possibility and risk versus benefits. [name] agreed to the medication and will contact this office if these side effects are intolerable.  Repetitive spaced learning was employed today to elicit superior memory formation and behavioral change.  3. Class 1 obesity with serious comorbidity and body mass index (BMI) of 32.0 to 32.9 in adult, unspecified obesity type [name] is currently in the action stage of change. As such, his goal is to continue with weight loss efforts. He has agreed to the Category 3 Plan.   Exercise goals: As is.  Behavioral modification strategies: increasing lean protein intake and meal planning and cooking strategies.  [name] has agreed to follow-up with our clinic in 2 weeks. He was informed of the importance of frequent follow-up visits to maximize his success with intensive lifestyle modifications for his multiple health conditions.   Objective:   Blood pressure (!) 150/89, pulse (!) 108, temperature 98 F (36.7 C), height 5\' 6"  (1.676 m), weight 199 lb (90.3 kg), SpO2 97 %. Body mass index is 32.12 kg/m.  General: Cooperative, alert, well developed, in no acute distress. HEENT: Conjunctivae and lids unremarkable. Cardiovascular: Regular rhythm.  Lungs: Normal work of breathing. Neurologic: No focal deficits.   Lab Results  Component Value Date   CREATININE 0.81 09/23/2020   BUN 17 09/23/2020   NA 142 09/23/2020   K 4.4 09/23/2020   CL 101 09/23/2020   CO2 21 09/23/2020  Lab Results  Component Value Date   ALT 27 09/23/2020   AST 22 09/23/2020   ALKPHOS 76 09/23/2020   BILITOT 0.3 09/23/2020   Lab Results  Component Value Date   HGBA1C 9.2 (H) 09/23/2020   HGBA1C 8.8 (H) 01/23/2020   HGBA1C 8.0 (H) 09/26/2019   HGBA1C 9.6 (H) 06/27/2019   HGBA1C 9.1 (H) 02/13/2019   Lab Results  Component Value Date   INSULIN 2.2 (L)  09/23/2020   Lab Results  Component Value Date   TSH 1.240 02/13/2019   Lab Results  Component Value Date   CHOL 186 09/23/2020   HDL 45 09/23/2020   LDLCALC 104 (H) 09/23/2020   TRIG 216 (H) 09/23/2020   CHOLHDL 4.1 09/23/2020   Lab Results  Component Value Date   WBC 6.5 03/18/2020   HGB 14.5 03/18/2020   HCT 43.3 03/18/2020   MCV 86.1 03/18/2020   PLT 371 03/18/2020   No results found for: IRON, TIBC, FERRITIN  Attestation Statements:   Reviewed by clinician on day of visit: allergies, medications, problem list, medical history, surgical history, family history, social history, and previous encounter notes.   Wilhemena Durie, am acting as transcriptionist for [employer], PA-C.  I have reviewed the above documentation for accuracy and completeness, and I agree with the above. Abby Potash, PA-C

## 2020-10-15 ENCOUNTER — Other Ambulatory Visit (INDEPENDENT_AMBULATORY_CARE_PROVIDER_SITE_OTHER): Payer: Self-pay | Admitting: Internal Medicine

## 2020-10-15 ENCOUNTER — Other Ambulatory Visit (HOSPITAL_COMMUNITY): Payer: Self-pay

## 2020-10-15 MED ORDER — INSULIN PEN NEEDLE 31G X 5 MM MISC
4 refills | Status: AC
  Filled 2020-10-15: qty 100, 25d supply, fill #0
  Filled 2021-06-12: qty 100, 25d supply, fill #1
  Filled 2021-08-19: qty 100, 25d supply, fill #2

## 2020-10-15 MED ORDER — INSULIN LISPRO (1 UNIT DIAL) 100 UNIT/ML (KWIKPEN)
PEN_INJECTOR | SUBCUTANEOUS | 4 refills | Status: DC
  Filled 2020-10-15: qty 90, 65d supply, fill #0
  Filled 2020-10-16 – 2020-10-17 (×2): qty 30, 28d supply, fill #0
  Filled 2020-11-21: qty 30, 28d supply, fill #1
  Filled 2020-12-31: qty 30, 28d supply, fill #2
  Filled 2020-12-31 – 2021-02-16 (×2): qty 30, 28d supply, fill #3
  Filled 2021-03-14: qty 30, 28d supply, fill #4
  Filled 2021-05-08: qty 30, 21d supply, fill #5
  Filled 2021-07-20: qty 30, 21d supply, fill #6
  Filled 2021-08-19: qty 30, 21d supply, fill #7

## 2020-10-15 MED FILL — Continuous Glucose System Sensor: 28 days supply | Qty: 2 | Fill #1 | Status: CN

## 2020-10-15 MED FILL — Insulin Glargine Soln Pen-Injector 100 Unit/ML: SUBCUTANEOUS | 28 days supply | Qty: 30 | Fill #0 | Status: CN

## 2020-10-15 MED FILL — Insulin Lispro Soln Pen-injector 100 Unit/ML (1 Unit Dial): SUBCUTANEOUS | 90 days supply | Qty: 90 | Fill #0 | Status: CN

## 2020-10-15 MED FILL — Insulin Degludec Soln Pen-Injector 100 Unit/ML: SUBCUTANEOUS | 30 days supply | Qty: 51 | Fill #0 | Status: CN

## 2020-10-15 MED FILL — Continuous Glucose System Sensor: 28 days supply | Qty: 2 | Fill #1 | Status: AC

## 2020-10-16 ENCOUNTER — Other Ambulatory Visit (HOSPITAL_COMMUNITY): Payer: Self-pay

## 2020-10-16 MED ORDER — TRESIBA FLEXTOUCH 100 UNIT/ML ~~LOC~~ SOPN
PEN_INJECTOR | SUBCUTANEOUS | 12 refills | Status: DC
  Filled 2020-10-16: qty 51, 30d supply, fill #0

## 2020-10-17 ENCOUNTER — Other Ambulatory Visit (HOSPITAL_COMMUNITY): Payer: Self-pay

## 2020-10-17 MED FILL — Insulin Glargine Soln Pen-Injector 100 Unit/ML: SUBCUTANEOUS | 2 days supply | Qty: 3 | Fill #0 | Status: CN

## 2020-10-31 ENCOUNTER — Other Ambulatory Visit: Payer: Self-pay

## 2020-11-04 ENCOUNTER — Other Ambulatory Visit: Payer: Self-pay

## 2020-11-04 ENCOUNTER — Other Ambulatory Visit (HOSPITAL_COMMUNITY): Payer: Self-pay

## 2020-11-04 ENCOUNTER — Ambulatory Visit (INDEPENDENT_AMBULATORY_CARE_PROVIDER_SITE_OTHER): Payer: 59 | Admitting: Family Medicine

## 2020-11-04 ENCOUNTER — Encounter (INDEPENDENT_AMBULATORY_CARE_PROVIDER_SITE_OTHER): Payer: Self-pay | Admitting: Family Medicine

## 2020-11-04 VITALS — BP 121/73 | HR 75 | Temp 98.3°F | Ht 66.0 in | Wt 194.0 lb

## 2020-11-04 DIAGNOSIS — E1169 Type 2 diabetes mellitus with other specified complication: Secondary | ICD-10-CM

## 2020-11-04 DIAGNOSIS — Z6832 Body mass index (BMI) 32.0-32.9, adult: Secondary | ICD-10-CM | POA: Diagnosis not present

## 2020-11-04 DIAGNOSIS — E669 Obesity, unspecified: Secondary | ICD-10-CM | POA: Diagnosis not present

## 2020-11-04 DIAGNOSIS — E1165 Type 2 diabetes mellitus with hyperglycemia: Secondary | ICD-10-CM | POA: Diagnosis not present

## 2020-11-04 DIAGNOSIS — F419 Anxiety disorder, unspecified: Secondary | ICD-10-CM

## 2020-11-04 DIAGNOSIS — E785 Hyperlipidemia, unspecified: Secondary | ICD-10-CM

## 2020-11-04 DIAGNOSIS — E559 Vitamin D deficiency, unspecified: Secondary | ICD-10-CM

## 2020-11-04 DIAGNOSIS — Z794 Long term (current) use of insulin: Secondary | ICD-10-CM

## 2020-11-04 DIAGNOSIS — Z9189 Other specified personal risk factors, not elsewhere classified: Secondary | ICD-10-CM

## 2020-11-04 MED ORDER — METFORMIN HCL ER 500 MG PO TB24
ORAL_TABLET | ORAL | 0 refills | Status: DC
  Filled 2020-11-04: qty 120, 30d supply, fill #0

## 2020-11-04 MED ORDER — VITAMIN D (ERGOCALCIFEROL) 1.25 MG (50000 UNIT) PO CAPS
50000.0000 [IU] | ORAL_CAPSULE | ORAL | 0 refills | Status: DC
  Filled 2020-11-04: qty 8, 28d supply, fill #0

## 2020-11-06 ENCOUNTER — Other Ambulatory Visit (HOSPITAL_COMMUNITY): Payer: Self-pay

## 2020-11-06 ENCOUNTER — Encounter (INDEPENDENT_AMBULATORY_CARE_PROVIDER_SITE_OTHER): Payer: Self-pay | Admitting: Family Medicine

## 2020-11-06 MED ORDER — ESCITALOPRAM OXALATE 10 MG PO TABS
10.0000 mg | ORAL_TABLET | Freq: Every day | ORAL | 0 refills | Status: DC
  Filled 2020-11-06: qty 30, 30d supply, fill #0

## 2020-11-06 NOTE — Progress Notes (Signed)
Chief Complaint:   OBESITY [name] is here to discuss his progress with his obesity treatment plan along with follow-up of his obesity related diagnoses.   Today's visit was #: 12 Starting weight: 199 lbs Starting date: 03/14/2018 Today's weight: 194 lbs Today's date: 11/04/2020 Weight change since last visit: -6 lbs Total lbs lost to date: 6 lbs Body mass index is 31.31 kg/m.  Total weight loss percentage to date: -2.51%  Interim History: [name] reports his fasting blood sugar is 146. He is currently taking Ozempic 2 mg, Humalog 35 units 4 times per day per sliding scale index, and Metformin 500 mg.  Current Meal Plan: the Category 3 Plan for 70% of the time.  Current Exercise Plan: Walking on the treadmill for 10 minutes 3 times per week. Current Anti-Obesity Medications: Ozempic 2 mg subcutaneously weekly. Side effects: None.  Assessment/Plan:   1. Type 2 diabetes mellitus with hyperglycemia, with long-term current use of insulin (HCC) Diabetes Mellitus: Not at goal. Medication: Ozempic 2 mg, Humalog 35 units 4 times per day per sliding scale index, and Metformin 500 mg. Issues reviewed: blood sugar goals, complications of diabetes mellitus, hypoglycemia prevention and treatment, exercise, and nutrition. He reports his blood sugars are improving. We discussed Mounjaro.  Plan: The patient was encouraged to monitor blood sugars regularly and to bring their log to the next appointment for review.  The patient will continue to focus on protein-rich, low simple carbohydrate foods. We reviewed the importance of hydration, regular exercise for stress reduction, and restorative sleep.   Lab Results  Component Value Date   HGBA1C 9.2 (H) 09/23/2020   HGBA1C 8.8 (H) 01/23/2020   HGBA1C 8.0 (H) 09/26/2019   Lab Results  Component Value Date   MICROALBUR 1.08 12/20/2013   LDLCALC 104 (H) 09/23/2020   CREATININE 0.81 09/23/2020   - Refill metFORMIN (GLUCOPHAGE-XR) 500 MG 24 hr  tablet; TAKE 2 TABLET BY MOUTH TWICE DAILY WITH A MEAL  Dispense: 120 tablet; Refill: 0  2. Vitamin D deficiency Not at goal. Current vitamin D is 27.4, tested on 09/23/2020. Optimal goal > 50 ng/dL.   Plan: Continue to take prescription Vitamin D @50 ,000 IU every week as prescribed.  Follow-up for routine testing of Vitamin D, at least 2-3 times per year to avoid over-replacement.  - Refill Vitamin D, Ergocalciferol, (DRISDOL) 1.25 MG (50000 UNIT) CAPS capsule; Take 1 capsule (50,000 Units total) by mouth 2 (two) times a week. Sunday & Wednesday  Dispense: 8 capsule; Refill: 0  3. Hyperlipidemia associated with type 2 diabetes mellitus ([place]) Course: Not at goal. Lipid-lowering medications: Crestor 40 mg daily.   Plan: Dietary changes: Increase soluble fiber, decrease simple carbohydrates, decrease saturated fat. Exercise changes: Moderate to vigorous-intensity aerobic activity 150 minutes per week or as tolerated. We will continue to monitor along with PCP/specialists as it pertains to his weight loss journey.  Lab Results  Component Value Date   CHOL 186 09/23/2020   HDL 45 09/23/2020   LDLCALC 104 (H) 09/23/2020   TRIG 216 (H) 09/23/2020   CHOLHDL 4.1 09/23/2020   Lab Results  Component Value Date   ALT 27 09/23/2020   AST 22 09/23/2020   ALKPHOS 76 09/23/2020   BILITOT 0.3 09/23/2020   The 10-year ASCVD risk score [name], et al., 2013) is: 11.2%   Values used to calculate the score:     Age: 55 years     Sex: Male     Is Non-Hispanic  African American: No     Diabetic: Yes     Tobacco smoker: No     Systolic Blood Pressure: 789 mmHg     Is BP treated: Yes     HDL Cholesterol: 45 mg/dL     Total Cholesterol: 186 mg/dL  4. Anxiety with emotional eating Behavior modification techniques were discussed today to help [name] deal with his anxiety.    - Refill escitalopram (LEXAPRO) 10 MG tablet; Take 1 tablet (10 mg total) by mouth daily.  Dispense: 30 tablet; Refill:  0  5. At risk for heart disease Due to [facility] current state of health and medical condition(s), he is at a higher risk for heart disease.   This puts the patient at much greater risk to subsequently develop cardiopulmonary conditions that can significantly affect patient's quality of life in a negative manner as well.    At least 9 minutes was spent on counseling [name] about these concerns today. Initial goal is to lose at least 5-10% of starting weight to help reduce these risk factors.  We will continue to reassess these conditions on a fairly regular basis in an attempt to decrease patient's overall morbidity and mortality.  Evidence-based interventions for health behavior change were utilized today including the discussion of self monitoring techniques, problem-solving barriers and SMART goal setting techniques.  Specifically regarding patient's less desirable eating habits and patterns, we employed the technique of small changes when [name] has not been able to fully commit to his prudent nutritional plan.  6. Obesity with current BMI of 31.4 Course: Steffon is currently in the action stage of change. As such, his goal is to continue with weight loss efforts.   Nutrition goals: He has agreed to the Category 3 Plan.   Exercise goals: As is  Behavioral modification strategies: increasing lean protein intake, decreasing simple carbohydrates, and increasing vegetables.  [name] has agreed to follow-up with our clinic in 3 weeks. He was informed of the importance of frequent follow-up visits to maximize his success with intensive lifestyle modifications for his multiple health conditions.   Objective:   Blood pressure 121/73, pulse 75, temperature 98.3 F (36.8 C), height 5\' 6"  (1.676 m), weight 194 lb (88 kg), SpO2 97 %. Body mass index is 31.31 kg/m.  General: Cooperative, alert, well developed, in no acute distress. HEENT: Conjunctivae and lids unremarkable. Cardiovascular: Regular  rhythm.  Lungs: Normal work of breathing. Neurologic: No focal deficits.   Lab Results  Component Value Date   CREATININE 0.81 09/23/2020   BUN 17 09/23/2020   NA 142 09/23/2020   K 4.4 09/23/2020   CL 101 09/23/2020   CO2 21 09/23/2020   Lab Results  Component Value Date   ALT 27 09/23/2020   AST 22 09/23/2020   ALKPHOS 76 09/23/2020   BILITOT 0.3 09/23/2020   Lab Results  Component Value Date   HGBA1C 9.2 (H) 09/23/2020   HGBA1C 8.8 (H) 01/23/2020   HGBA1C 8.0 (H) 09/26/2019   HGBA1C 9.6 (H) 06/27/2019   HGBA1C 9.1 (H) 02/13/2019   Lab Results  Component Value Date   INSULIN 2.2 (L) 09/23/2020   Lab Results  Component Value Date   TSH 1.240 02/13/2019   Lab Results  Component Value Date   CHOL 186 09/23/2020   HDL 45 09/23/2020   LDLCALC 104 (H) 09/23/2020   TRIG 216 (H) 09/23/2020   CHOLHDL 4.1 09/23/2020   Lab Results  Component Value Date   WBC 6.5 03/18/2020  HGB 14.5 03/18/2020   HCT 43.3 03/18/2020   MCV 86.1 03/18/2020   PLT 371 03/18/2020   Attestation Statements:   Reviewed by clinician on day of visit: allergies, medications, problem list, medical history, surgical history, family history, social history, and previous encounter notes.  Leodis Binet Friedenbach, CMA, am acting as Location manager for [employer], DO.  I have reviewed the above documentation for accuracy and completeness, and I agree with the above. Briscoe Deutscher, DO

## 2020-11-07 ENCOUNTER — Other Ambulatory Visit (HOSPITAL_COMMUNITY): Payer: Self-pay

## 2020-11-07 MED FILL — Continuous Glucose System Sensor: 28 days supply | Qty: 2 | Fill #2 | Status: AC

## 2020-11-11 ENCOUNTER — Other Ambulatory Visit (HOSPITAL_COMMUNITY): Payer: Self-pay

## 2020-11-12 ENCOUNTER — Other Ambulatory Visit (HOSPITAL_COMMUNITY): Payer: Self-pay

## 2020-11-12 MED FILL — Bictegravir-Emtricitabine-Tenofovir AF Tab 50-200-25 MG: ORAL | 30 days supply | Qty: 30 | Fill #2 | Status: AC

## 2020-11-19 ENCOUNTER — Other Ambulatory Visit (HOSPITAL_COMMUNITY): Payer: Self-pay

## 2020-11-21 ENCOUNTER — Other Ambulatory Visit (HOSPITAL_COMMUNITY): Payer: Self-pay

## 2020-11-21 ENCOUNTER — Other Ambulatory Visit (INDEPENDENT_AMBULATORY_CARE_PROVIDER_SITE_OTHER): Payer: Self-pay | Admitting: Family Medicine

## 2020-11-21 DIAGNOSIS — F419 Anxiety disorder, unspecified: Secondary | ICD-10-CM

## 2020-11-21 NOTE — Telephone Encounter (Signed)
Dr.Wallace °

## 2020-11-22 MED FILL — Insulin Glargine Soln Pen-Injector 100 Unit/ML: SUBCUTANEOUS | 28 days supply | Qty: 30 | Fill #0 | Status: AC

## 2020-11-23 ENCOUNTER — Other Ambulatory Visit (HOSPITAL_COMMUNITY): Payer: Self-pay

## 2020-12-02 ENCOUNTER — Other Ambulatory Visit (HOSPITAL_COMMUNITY): Payer: Self-pay

## 2020-12-03 ENCOUNTER — Other Ambulatory Visit (HOSPITAL_COMMUNITY): Payer: Self-pay

## 2020-12-03 MED FILL — Continuous Glucose System Sensor: 28 days supply | Qty: 2 | Fill #3 | Status: AC

## 2020-12-09 ENCOUNTER — Ambulatory Visit (INDEPENDENT_AMBULATORY_CARE_PROVIDER_SITE_OTHER): Payer: 59 | Admitting: Family Medicine

## 2020-12-09 ENCOUNTER — Encounter (INDEPENDENT_AMBULATORY_CARE_PROVIDER_SITE_OTHER): Payer: Self-pay

## 2020-12-10 ENCOUNTER — Other Ambulatory Visit: Payer: Self-pay | Admitting: Pharmacist

## 2020-12-10 ENCOUNTER — Other Ambulatory Visit (HOSPITAL_COMMUNITY): Payer: Self-pay

## 2020-12-10 DIAGNOSIS — B20 Human immunodeficiency virus [HIV] disease: Secondary | ICD-10-CM

## 2020-12-10 MED ORDER — BIKTARVY 50-200-25 MG PO TABS
1.0000 | ORAL_TABLET | Freq: Every day | ORAL | 2 refills | Status: DC
  Filled 2020-12-10 – 2020-12-11 (×2): qty 30, 30d supply, fill #0
  Filled 2021-01-09: qty 30, 30d supply, fill #1
  Filled 2021-01-30: qty 30, 30d supply, fill #2

## 2020-12-11 ENCOUNTER — Other Ambulatory Visit (HOSPITAL_COMMUNITY): Payer: Self-pay

## 2020-12-23 NOTE — Progress Notes (Signed)
Cardiology Office Note  Date: 12/24/2020   ID: Omar, [name], 1967, MRN [id number]  PCP:  Omar Noble, MD  Cardiologist:  Omar Lesches, MD Electrophysiologist:  None   Chief Complaint  Patient presents with   Cardiac follow-up    History of Present Illness: [name] is a 55 y.o. male last seen in April.  He presents for a follow-up visit.  Reports no angina symptoms and states that he has been compliant with his medications.  He continues to follow with Dr. Willey Woods.  Lipid panel in May revealed LDL 104 and HDL 45.  He is on Crestor 40 mg daily, we discussed addition of Zetia.  I reviewed the remainder of his medications which are noted below.  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   Syphilitic hepatitis    Type 2 diabetes mellitus [facility])     Past Surgical History:  Procedure Laterality Date   BIOPSY  12/28/2019   Procedure: BIOPSY;  Surgeon: Omar Houston, MD;  [facility];  Service: Endoscopy;;   CHOLECYSTECTOMY OPEN  07/2001   COLONOSCOPY N/A 05/28/2017   Procedure: COLONOSCOPY;  Surgeon: Omar Houston, MD;  Location[facility];  Service: Endoscopy;  Laterality: N/A;  200 - pt to prep in Endo   CORONARY ANGIOPLASTY WITH STENT PLACEMENT  03/2006; 10/2007;    ESOPHAGEAL DILATION N/A 12/28/2019   Procedure: ESOPHAGEAL DILATION;  Surgeon: Omar Houston, MD;  Location: [facility];  Service: Endoscopy;  Laterality: N/A;    ESOPHAGOGASTRODUODENOSCOPY N/A 12/28/2019   Procedure: ESOPHAGOGASTRODUODENOSCOPY (EGD);  Surgeon: Omar Houston, MD;  Location[facility];  Service: Endoscopy;  Laterality: N/A;  125, per [name] moved to 10:55 pt aware   HERNIA REPAIR     LAPAROSCOPIC INCISIONAL / [place] / [place]  07/2001   UHR   POLYPECTOMY  05/28/2017   Procedure: POLYPECTOMY;  Surgeon: Omar Houston, MD;  Location: [facility];  Service: Endoscopy;;  colon    Current Outpatient Medications  Medication Sig Dispense Refill   aspirin 81 MG tablet Take 1 tablet (81 mg total) by mouth daily with breakfast. 30 tablet 3   bictegravir-emtricitabine-tenofovir AF (BIKTARVY) 50-200-25 MG TABS tablet TAKE 1 TABLET BY MOUTH DAILY. 30 tablet 2   chlorthalidone (HYGROTON) 25 MG tablet Take 0.5 tablets (12.5 mg total) by mouth daily. 45 tablet 3   Continuous Blood Gluc Sensor (FREESTYLE LIBRE 14 DAY SENSOR) MISC USE AS DIRECTED 2 each 12   escitalopram (LEXAPRO) 10 MG tablet Take 1 tablet (10 mg total) by mouth daily. 30 tablet 0   ezetimibe (ZETIA) 10 MG tablet Take 1 tablet (10 mg total) by mouth daily. 90 tablet 3   Insulin Glargine (BASAGLAR KWIKPEN) 100 UNIT/ML INJECT 70 UNITS UNDER THE SKIN TWO TIMES DAILY 30 mL 12   insulin lispro (HUMALOG) 100 UNIT/ML KwikPen Inject 35 Units  subcutaneously 4 times a day 90 mL 4   Insulin Pen Needle 31G X 5 MM MISC Use as directed 150 each 4   lisinopril (ZESTRIL) 10 MG tablet TAKE 1 TABLET BY MOUTH EVERY DAY 90 tablet 3   metFORMIN (GLUCOPHAGE-XR) 500 MG Woods hr tablet TAKE 2 TABLET BY MOUTH TWICE DAILY WITH A MEAL 120 tablet 0   metoprolol succinate (TOPROL-XL) 100 MG Woods hr tablet Take 1 tablet (100 mg total) by mouth daily. 90 tablet 3   Multiple Vitamins-Minerals (MULTIVITAMIN WITH MINERALS) tablet Take 1 tablet by mouth daily.     rosuvastatin (CRESTOR) 40 MG tablet Take 1 tablet (40 mg total) by mouth daily. 90 tablet 3   Semaglutide, 2 MG/DOSE, (OZEMPIC, 2 MG/DOSE,) 8  MG/3ML SOPN Inject 2 mg into the skin once a week. 9 mL 0   Vitamin D, Ergocalciferol, (DRISDOL) 1.25 MG (50000 UNIT) CAPS capsule Take 1 capsule (50,000 Units total) by mouth 2 (two) times a week. Sunday & Wednesday 8 capsule 0    Allergies:  Pepcid [famotidine], Prilosec [omeprazole], and Odefsey [emtricitabine-rilpivirine-tenofovir af]   ROS: No palpitations or syncope.  Physical Exam: VS:  BP 138/76   Pulse 86   Ht '5\' 6"'$  (1.676 m)   Wt 194 lb (88 kg)   SpO2 96%   BMI 31.31 kg/m , BMI Body mass index is 31.31 kg/m.  Wt Readings from Last 3 Encounters:  12/24/20 194 lb (88 kg)  11/04/20 194 lb (88 kg)  10/14/20 199 lb (90.3 kg)    General: Patient appears comfortable at rest. HEENT: Conjunctiva and lids normal, wearing a mask. Neck: Supple, no elevated JVP or carotid bruits. Lungs: Clear to auscultation, nonlabored breathing at rest. Cardiac: Regular rate and rhythm, no S3 or significant systolic murmur, no pericardial rub. Extremities: No pitting edema.  ECG:  An ECG dated 03/05/2020 was personally reviewed today and demonstrated:  Sinus tachycardia with lead motion artifact, decreased R wave progression.  Recent Labwork: 03/18/2020: Hemoglobin 14.5; Platelets 371 09/23/2020: ALT 27; AST 22; BUN 17; Creatinine, Ser 0.81; Potassium 4.4; Sodium 142     Component Value Date/Time   CHOL 186 09/23/2020 1513   TRIG 216 (H) 09/23/2020 1513   HDL 45 09/23/2020 1513   CHOLHDL 4.1 09/23/2020 1513   CHOLHDL 4.1 03/18/2020 0954   VLDL 59 (H) 10/28/2015 0957   LDLCALC 104 (H) 09/23/2020 1513   LDLCALC 119 (H) 03/18/2020 0954    Other Studies Reviewed Today:  Echocardiogram 07/10/2011: - Left ventricle: The cavity size was normal. Wall thickness    was normal. Systolic function was normal. The estimated    ejection fraction was in the range of 55% to 60%. Wall    motion was normal; there were no regional wall motion    abnormalities. Features are consistent with a pseudonormal     left ventricular filling pattern, with concomitant    abnormal relaxation and increased filling pressure (grade    2 diastolic dysfunction).  - Atrial septum: No defect or patent foramen ovale was    identified.    Assessment and Plan:  1.  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  He does not report any active angina at this time.  We have continued with observation on medical therapy, will consider follow-up ischemic surveillance testing after his next visit.  Continue aspirin, Toprol-XL, lisinopril, and Crestor.  2.  Mixed hyperlipidemia, most recent LDL down to  104.  He is on Crestor 40 mg daily and reports compliance.  Start Zetia 10 mg daily.  If ineffective combination can consider switch to PCSK9 inhibitor.  Check FLP in 3 months.  3.  Essential hypertension, no changes made to present regimen.  4.  Type 2 diabetes mellitus, continue to follow with PCP.  Currently on Glucophage XR, insulin, and Ozempic.  Medication Adjustments/Labs and Tests Ordered: Current medicines are reviewed at length with the patient today.  Concerns regarding medicines are outlined above.   Tests Ordered: Orders Placed This Encounter  Procedures   Hepatic function panel   Lipid Profile     Medication Changes: Meds ordered this encounter  Medications   ezetimibe (ZETIA) 10 MG tablet    Sig: Take 1 tablet (10 mg total) by mouth daily.    Dispense:  90 tablet    Refill:  3    Disposition:  Follow up  6 months.  Signed, Satira Sark, MD, [facility] 12/24/2020 1:23 PM    [facility] at [facility] [address] [place], [place] [address] Phone: [phone]; Fax: [phone]

## 2020-12-24 ENCOUNTER — Ambulatory Visit: Payer: 59 | Admitting: Cardiology

## 2020-12-24 ENCOUNTER — Encounter: Payer: Self-pay | Admitting: Cardiology

## 2020-12-24 ENCOUNTER — Other Ambulatory Visit (HOSPITAL_COMMUNITY): Payer: Self-pay

## 2020-12-24 ENCOUNTER — Other Ambulatory Visit: Payer: Self-pay

## 2020-12-24 VITALS — BP 138/76 | HR 86 | Ht 66.0 in | Wt 194.0 lb

## 2020-12-24 DIAGNOSIS — I25119 Atherosclerotic heart disease of native coronary artery with unspecified angina pectoris: Secondary | ICD-10-CM

## 2020-12-24 DIAGNOSIS — I10 Essential (primary) hypertension: Secondary | ICD-10-CM | POA: Diagnosis not present

## 2020-12-24 DIAGNOSIS — E782 Mixed hyperlipidemia: Secondary | ICD-10-CM

## 2020-12-24 MED ORDER — EZETIMIBE 10 MG PO TABS
10.0000 mg | ORAL_TABLET | Freq: Every day | ORAL | 3 refills | Status: DC
  Filled 2020-12-24: qty 90, 90d supply, fill #0
  Filled 2021-04-08: qty 90, 90d supply, fill #1
  Filled 2021-04-14 – 2021-11-09 (×2): qty 90, 90d supply, fill #2

## 2020-12-24 NOTE — Patient Instructions (Signed)
Medication Instructions:    START Zetia 10 mg daily  *If you need a refill on your cardiac medications before your next appointment, please call your pharmacy*   Lab Work:  Fasting Lipids and LFT's in 3 months  If you have labs (blood work) drawn today and your tests are completely normal, you will receive your results only by: [place] (if you have MyChart) OR A paper copy in the mail If you have any lab test that is abnormal or we need to change your treatment, we will call you to review the results.   Testing/Procedures: None today    Follow-Up: At [facility], you and your health needs are our priority.  As part of our continuing mission to provide you with exceptional heart care, we have created designated Provider Care Teams.  These Care Teams include your primary Cardiologist (physician) and Advanced Practice Providers (APPs -  Physician Assistants and Nurse Practitioners) who all work together to provide you with the care you need, when you need it.  We recommend signing up for the patient portal called "MyChart".  Sign up information is provided on this After Visit Summary.  MyChart is used to connect with patients for Virtual Visits (Telemedicine).  Patients are able to view lab/test results, encounter notes, upcoming appointments, etc.  Non-urgent messages can be sent to your provider as well.   To learn more about what you can do with MyChart, go to [email or web address].    Your next appointment:   6 month(s)  The format for your next appointment:   In Person  Provider:   Rozann Lesches, MD   Other Instructions None

## 2020-12-25 ENCOUNTER — Other Ambulatory Visit (HOSPITAL_COMMUNITY): Payer: Self-pay

## 2020-12-29 MED FILL — Continuous Glucose System Sensor: 28 days supply | Qty: 2 | Fill #4 | Status: AC

## 2020-12-30 ENCOUNTER — Other Ambulatory Visit (INDEPENDENT_AMBULATORY_CARE_PROVIDER_SITE_OTHER): Payer: Self-pay | Admitting: Family Medicine

## 2020-12-30 ENCOUNTER — Other Ambulatory Visit (HOSPITAL_COMMUNITY): Payer: Self-pay

## 2020-12-30 DIAGNOSIS — Z794 Long term (current) use of insulin: Secondary | ICD-10-CM

## 2020-12-30 DIAGNOSIS — F419 Anxiety disorder, unspecified: Secondary | ICD-10-CM

## 2020-12-30 DIAGNOSIS — E559 Vitamin D deficiency, unspecified: Secondary | ICD-10-CM

## 2020-12-30 DIAGNOSIS — E1165 Type 2 diabetes mellitus with hyperglycemia: Secondary | ICD-10-CM

## 2020-12-30 NOTE — Telephone Encounter (Signed)
Dr.Wallace °

## 2020-12-31 ENCOUNTER — Other Ambulatory Visit (HOSPITAL_COMMUNITY): Payer: Self-pay

## 2020-12-31 ENCOUNTER — Encounter (INDEPENDENT_AMBULATORY_CARE_PROVIDER_SITE_OTHER): Payer: Self-pay

## 2020-12-31 DIAGNOSIS — I10 Essential (primary) hypertension: Secondary | ICD-10-CM | POA: Diagnosis not present

## 2020-12-31 DIAGNOSIS — E1122 Type 2 diabetes mellitus with diabetic chronic kidney disease: Secondary | ICD-10-CM | POA: Diagnosis not present

## 2020-12-31 MED FILL — Insulin Glargine Soln Pen-Injector 100 Unit/ML: SUBCUTANEOUS | 28 days supply | Qty: 30 | Fill #1 | Status: AC

## 2020-12-31 MED FILL — Insulin Glargine Soln Pen-Injector 100 Unit/ML: SUBCUTANEOUS | 28 days supply | Qty: 30 | Fill #2 | Status: CN

## 2020-12-31 NOTE — Telephone Encounter (Signed)
Msg sent to pt 

## 2021-01-01 ENCOUNTER — Other Ambulatory Visit (HOSPITAL_COMMUNITY): Payer: Self-pay

## 2021-01-06 ENCOUNTER — Other Ambulatory Visit (HOSPITAL_COMMUNITY): Payer: Self-pay

## 2021-01-07 ENCOUNTER — Ambulatory Visit (INDEPENDENT_AMBULATORY_CARE_PROVIDER_SITE_OTHER): Payer: 59 | Admitting: Family Medicine

## 2021-01-07 ENCOUNTER — Other Ambulatory Visit: Payer: Self-pay

## 2021-01-07 ENCOUNTER — Encounter (INDEPENDENT_AMBULATORY_CARE_PROVIDER_SITE_OTHER): Payer: Self-pay | Admitting: Family Medicine

## 2021-01-07 ENCOUNTER — Other Ambulatory Visit (HOSPITAL_COMMUNITY): Payer: Self-pay

## 2021-01-07 VITALS — BP 138/74 | HR 92 | Temp 98.8°F | Ht 66.0 in | Wt 190.0 lb

## 2021-01-07 DIAGNOSIS — Z9189 Other specified personal risk factors, not elsewhere classified: Secondary | ICD-10-CM

## 2021-01-07 DIAGNOSIS — Z794 Long term (current) use of insulin: Secondary | ICD-10-CM | POA: Diagnosis not present

## 2021-01-07 DIAGNOSIS — I152 Hypertension secondary to endocrine disorders: Secondary | ICD-10-CM

## 2021-01-07 DIAGNOSIS — E669 Obesity, unspecified: Secondary | ICD-10-CM

## 2021-01-07 DIAGNOSIS — E559 Vitamin D deficiency, unspecified: Secondary | ICD-10-CM | POA: Diagnosis not present

## 2021-01-07 DIAGNOSIS — E119 Type 2 diabetes mellitus without complications: Secondary | ICD-10-CM

## 2021-01-07 DIAGNOSIS — E1159 Type 2 diabetes mellitus with other circulatory complications: Secondary | ICD-10-CM | POA: Diagnosis not present

## 2021-01-07 DIAGNOSIS — Z6832 Body mass index (BMI) 32.0-32.9, adult: Secondary | ICD-10-CM | POA: Diagnosis not present

## 2021-01-07 MED ORDER — VITAMIN D (ERGOCALCIFEROL) 1.25 MG (50000 UNIT) PO CAPS
50000.0000 [IU] | ORAL_CAPSULE | ORAL | 0 refills | Status: DC
  Filled 2021-01-07: qty 8, 28d supply, fill #0

## 2021-01-08 ENCOUNTER — Ambulatory Visit (HOSPITAL_COMMUNITY)
Admission: RE | Admit: 2021-01-08 | Discharge: 2021-01-08 | Disposition: A | Discharge status: Home / Self Care | Payer: 59 | Source: Ambulatory Visit | Attending: Internal Medicine | Admitting: Internal Medicine

## 2021-01-08 ENCOUNTER — Other Ambulatory Visit (HOSPITAL_COMMUNITY): Payer: Self-pay | Admitting: Internal Medicine

## 2021-01-08 DIAGNOSIS — R0602 Shortness of breath: Secondary | ICD-10-CM

## 2021-01-08 DIAGNOSIS — S2241XA Multiple fractures of ribs, right side, initial encounter for closed fracture: Secondary | ICD-10-CM | POA: Diagnosis not present

## 2021-01-09 ENCOUNTER — Encounter (INDEPENDENT_AMBULATORY_CARE_PROVIDER_SITE_OTHER): Payer: Self-pay | Admitting: Family Medicine

## 2021-01-09 ENCOUNTER — Other Ambulatory Visit (HOSPITAL_COMMUNITY): Payer: Self-pay

## 2021-01-09 MED ORDER — TIRZEPATIDE 5 MG/0.5ML ~~LOC~~ SOAJ
5.0000 mg | SUBCUTANEOUS | 0 refills | Status: DC
  Filled 2021-01-09: qty 2, 28d supply, fill #0

## 2021-01-09 MED ORDER — TIRZEPATIDE 7.5 MG/0.5ML ~~LOC~~ SOAJ
7.5000 mg | SUBCUTANEOUS | 0 refills | Status: DC
  Filled 2021-01-09 – 2021-02-17 (×2): qty 2, 28d supply, fill #0

## 2021-01-10 NOTE — Progress Notes (Signed)
Chief Complaint:   OBESITY [name] is here to discuss his progress with his obesity treatment plan along with follow-up of his obesity related diagnoses.   Today's visit was #: 17 Starting weight: 199 lbs Starting date: 03/14/2018 Today's weight: 190lbs Today's date: 01/10/2021 Weight change since last visit: -4lbs Total lbs lost to date: 9lbs Body mass index is 30.67 kg/m.  Total weight loss percentage to date: 4.52%  Current Meal Plan: the Category 3 Plan for 70% of the time.  Current Exercise Plan: walking dog for 30 minutes 3-4 times per week. Current Anti-Obesity Medications: Ozempic 2 mg subcutaneously weekly. Side effects: None.  Interim History:[name] reports that his fasting blood sugars some mornings are above 150 mg/dL, but he notices that they are only this high when he eats at night - his go-to snack is peanut M&Ms. Sometimes he also notes some feelings that he likens to "morning sickness".  Assessment/Plan:   1. Type 2 diabetes mellitus without complication, with long-term current use of insulin (HCC) Diabetes Mellitus: Not at goal. Medication: Basaglar 70 units twice daily, Humalog 35 units 4 times daily, Metformin '500mg'$  2 tablets twice daily, and Ozempic '2mg'$  once weekly injection.   Plan: Stop taking Ozempic weekly injection and begin taking Mounjaro once weekly injection. We have prescribe two separate doses. Begin with the '5mg'$  dose for four weeks, and then increase to the 7.'5mg'$  dose for four weeks. The patient was encouraged to monitor blood sugars regularly and to bring their log to the next appointment for review.  The patient will continue to focus on protein-rich, low simple carbohydrate foods. We reviewed the importance of hydration, regular exercise for stress reduction, and restorative sleep.   New medications prescribed as per below: - tirzepatide (MOUNJARO) 5 MG/0.5ML Pen; Inject 5 mg into the skin once a week.  Dispense: 2 mL; Refill: 0 - tirzepatide  (MOUNJARO) 7.5 MG/0.5ML Pen; Inject 7.5 mg into the skin once a week.  Dispense: 2 mL; Refill: 0  Lab Results  Component Value Date   HGBA1C 9.2 (H) 09/23/2020   HGBA1C 8.8 (H) 01/23/2020   HGBA1C 8.0 (H) 09/26/2019   Lab Results  Component Value Date   MICROALBUR 1.08 12/20/2013   LDLCALC 104 (H) 09/23/2020   CREATININE 0.81 09/23/2020   2. Vitamin D deficiency Not at goal.   Plan: Continue to take prescription Vitamin D '@50'$ ,000 IU every week as prescribed.  Follow-up for routine testing of Vitamin D, at least 2-3 times per year to avoid over-replacement.  Lab Results  Component Value Date   VD25OH 27.4 (L) 09/23/2020   VD25OH 24.8 (L) 01/23/2020   VD25OH 45.0 09/26/2019   Refill medications as per below: - Vitamin D, Ergocalciferol, (DRISDOL) 1.25 MG (50000 UNIT) CAPS capsule; Take 1 capsule by mouth 2 times a week. Sunday & Wednesday  Dispense: 8 capsule; Refill: 0  3. Hypertension associated with diabetes ([place]) Controlled. Medications: Chlorthalidone '25mg'$  (half tab) daily, Metoprolol Succinate '100mg'$  daily, and Lisinopril '10mg'$  daily.  Plan: Avoid buying foods that are: processed, frozen, or prepackaged to avoid excess salt. We will continue to monitor closely alongside his PCP and/or Specialist.    BP Readings from Last 3 Encounters:  01/07/21 138/74  12/24/20 138/76  11/04/20 121/73   Lab Results  Component Value Date   CREATININE 0.81 09/23/2020   4. At risk for heart disease Due to [facility] current state of health and medical condition(s), he is at a higher risk for heart disease.  This puts the patient at much greater risk to subsequently develop cardiopulmonary conditions that can significantly affect patient's quality of life in a negative manner.    At least 15 minutes were spent on counseling [name] about these concerns today. Evidence-based interventions for health behavior change were utilized today including the discussion of self monitoring techniques,  problem-solving barriers, and SMART goal setting techniques.  Specifically, regarding patient's less desirable eating habits and patterns, we employed the technique of small changes when [name] has not been able to fully commit to his prudent nutritional plan.  5. Obesity with current BMI of 30.7 Course: [name] is currently in the action stage of change. As such, his goal is to continue with weight loss efforts.   Nutrition goals: He has agreed to the Category 3 Plan.   Exercise goals:  As is.  Behavioral modification strategies: increasing lean protein intake, decreasing simple carbohydrates, increasing vegetables, increasing water intake, and decreasing liquid calories.  [name] has agreed to follow-up with our clinic in 4 weeks. He was informed of the importance of frequent follow-up visits to maximize his success with intensive lifestyle modifications for his multiple health conditions.  Objective:   Blood pressure 138/74, pulse 92, temperature 98.8 F (37.1 C), height '5\' 6"'$  (1.676 m), weight 190 lb (86.2 kg), SpO2 97 %. Body mass index is 30.67 kg/m.  General: Cooperative, alert, well developed, in no acute distress. HEENT: Conjunctivae and lids unremarkable. Cardiovascular: Regular rhythm.  Lungs: Normal work of breathing. Neurologic: No focal deficits.   Lab Results  Component Value Date   CREATININE 0.81 09/23/2020   BUN 17 09/23/2020   NA 142 09/23/2020   K 4.4 09/23/2020   CL 101 09/23/2020   CO2 21 09/23/2020   Lab Results  Component Value Date   ALT 27 09/23/2020   AST 22 09/23/2020   ALKPHOS 76 09/23/2020   BILITOT 0.3 09/23/2020   Lab Results  Component Value Date   HGBA1C 9.2 (H) 09/23/2020   HGBA1C 8.8 (H) 01/23/2020   HGBA1C 8.0 (H) 09/26/2019   HGBA1C 9.6 (H) 06/27/2019   HGBA1C 9.1 (H) 02/13/2019   Lab Results  Component Value Date   INSULIN 2.2 (L) 09/23/2020   Lab Results  Component Value Date   TSH 1.240 02/13/2019   Lab Results  Component  Value Date   CHOL 186 09/23/2020   HDL 45 09/23/2020   LDLCALC 104 (H) 09/23/2020   TRIG 216 (H) 09/23/2020   CHOLHDL 4.1 09/23/2020   Lab Results  Component Value Date   VD25OH 27.4 (L) 09/23/2020   VD25OH 24.8 (L) 01/23/2020   VD25OH 45.0 09/26/2019   Lab Results  Component Value Date   WBC 6.5 03/18/2020   HGB 14.5 03/18/2020   HCT 43.3 03/18/2020   MCV 86.1 03/18/2020   PLT 371 03/18/2020   No results found for: IRON, TIBC, FERRITIN Attestation Statements:   Reviewed by clinician on day of visit: allergies, medications, problem list, medical history, surgical history, family history, social history, and previous encounter notes.  I, Wyatt Haste, LPN am acting as Location manager for [employer], DO.  I have reviewed the above documentation for accuracy and completeness, and I agree with the above. Briscoe Deutscher, DO

## 2021-01-20 DIAGNOSIS — Z1331 Encounter for screening for depression: Secondary | ICD-10-CM | POA: Diagnosis not present

## 2021-01-20 DIAGNOSIS — Z23 Encounter for immunization: Secondary | ICD-10-CM | POA: Diagnosis not present

## 2021-01-20 DIAGNOSIS — E119 Type 2 diabetes mellitus without complications: Secondary | ICD-10-CM | POA: Diagnosis not present

## 2021-01-30 ENCOUNTER — Other Ambulatory Visit (HOSPITAL_COMMUNITY): Payer: Self-pay

## 2021-01-30 ENCOUNTER — Other Ambulatory Visit (INDEPENDENT_AMBULATORY_CARE_PROVIDER_SITE_OTHER): Payer: Self-pay

## 2021-01-30 MED ORDER — BASAGLAR KWIKPEN 100 UNIT/ML ~~LOC~~ SOPN
PEN_INJECTOR | SUBCUTANEOUS | 12 refills | Status: DC
  Filled 2021-01-30: qty 90, 84d supply, fill #0
  Filled 2021-04-14: qty 30, 21d supply, fill #0
  Filled 2021-04-16: qty 30, 28d supply, fill #0

## 2021-01-30 MED ORDER — FREESTYLE LIBRE 14 DAY SENSOR MISC
12 refills | Status: DC
  Filled 2021-01-30: qty 6, 90d supply, fill #0
  Filled 2021-04-14: qty 6, 84d supply, fill #1
  Filled 2021-04-22 – 2021-04-30 (×4): qty 6, 90d supply, fill #1
  Filled 2021-05-08: qty 6, 84d supply, fill #1
  Filled 2021-05-22: qty 6, 90d supply, fill #1
  Filled 2021-06-04: qty 6, 84d supply, fill #1
  Filled 2021-08-19: qty 6, 84d supply, fill #2
  Filled 2021-11-09: qty 6, 84d supply, fill #3
  Filled 2021-11-12: qty 2, 28d supply, fill #4

## 2021-01-30 MED ORDER — INSULIN LISPRO (1 UNIT DIAL) 100 UNIT/ML (KWIKPEN)
PEN_INJECTOR | SUBCUTANEOUS | 4 refills | Status: DC
  Filled 2021-01-30: qty 120, 84d supply, fill #0
  Filled 2021-08-19: qty 90, fill #0
  Filled 2021-11-09: qty 90, 64d supply, fill #0
  Filled 2021-12-14: qty 90, 84d supply, fill #0

## 2021-02-03 ENCOUNTER — Other Ambulatory Visit (HOSPITAL_COMMUNITY): Payer: Self-pay

## 2021-02-16 ENCOUNTER — Other Ambulatory Visit (INDEPENDENT_AMBULATORY_CARE_PROVIDER_SITE_OTHER): Payer: Self-pay | Admitting: Family Medicine

## 2021-02-16 DIAGNOSIS — Z794 Long term (current) use of insulin: Secondary | ICD-10-CM

## 2021-02-16 DIAGNOSIS — E119 Type 2 diabetes mellitus without complications: Secondary | ICD-10-CM

## 2021-02-17 ENCOUNTER — Other Ambulatory Visit (HOSPITAL_COMMUNITY): Payer: Self-pay

## 2021-02-17 ENCOUNTER — Ambulatory Visit (INDEPENDENT_AMBULATORY_CARE_PROVIDER_SITE_OTHER): Payer: 59 | Admitting: Family Medicine

## 2021-02-17 DIAGNOSIS — Z23 Encounter for immunization: Secondary | ICD-10-CM | POA: Diagnosis not present

## 2021-02-17 NOTE — Telephone Encounter (Signed)
Last OV with Dr Wallace 

## 2021-02-18 ENCOUNTER — Other Ambulatory Visit (HOSPITAL_COMMUNITY): Payer: Self-pay

## 2021-02-24 ENCOUNTER — Other Ambulatory Visit (HOSPITAL_COMMUNITY): Payer: Self-pay

## 2021-02-24 ENCOUNTER — Other Ambulatory Visit: Payer: Self-pay | Admitting: Infectious Disease

## 2021-02-24 DIAGNOSIS — B20 Human immunodeficiency virus [HIV] disease: Secondary | ICD-10-CM

## 2021-02-24 MED ORDER — BIKTARVY 50-200-25 MG PO TABS
1.0000 | ORAL_TABLET | Freq: Every day | ORAL | 0 refills | Status: DC
  Filled 2021-02-24: qty 30, 30d supply, fill #0

## 2021-02-25 ENCOUNTER — Other Ambulatory Visit (HOSPITAL_COMMUNITY): Payer: Self-pay

## 2021-02-25 ENCOUNTER — Other Ambulatory Visit (INDEPENDENT_AMBULATORY_CARE_PROVIDER_SITE_OTHER): Payer: Self-pay | Admitting: Family Medicine

## 2021-02-25 DIAGNOSIS — E1165 Type 2 diabetes mellitus with hyperglycemia: Secondary | ICD-10-CM

## 2021-02-25 DIAGNOSIS — Z794 Long term (current) use of insulin: Secondary | ICD-10-CM

## 2021-02-26 ENCOUNTER — Encounter (INDEPENDENT_AMBULATORY_CARE_PROVIDER_SITE_OTHER): Payer: Self-pay

## 2021-02-26 NOTE — Telephone Encounter (Signed)
Pt last seen by Dr. Juleen China. MyChart message sent to pt to find out if they have enough medication to get them through until next appt.

## 2021-02-27 ENCOUNTER — Other Ambulatory Visit (HOSPITAL_COMMUNITY): Payer: Self-pay

## 2021-03-03 ENCOUNTER — Other Ambulatory Visit (HOSPITAL_COMMUNITY): Payer: Self-pay

## 2021-03-10 ENCOUNTER — Encounter: Payer: Self-pay | Admitting: Infectious Disease

## 2021-03-10 ENCOUNTER — Other Ambulatory Visit (HOSPITAL_COMMUNITY): Payer: Self-pay

## 2021-03-10 ENCOUNTER — Telehealth: Payer: Self-pay

## 2021-03-10 ENCOUNTER — Ambulatory Visit: Payer: 59 | Admitting: Infectious Disease

## 2021-03-10 ENCOUNTER — Other Ambulatory Visit: Payer: Self-pay | Admitting: Pharmacist

## 2021-03-10 ENCOUNTER — Other Ambulatory Visit: Payer: Self-pay

## 2021-03-10 VITALS — BP 136/76 | HR 90 | Temp 98.4°F | Ht 66.0 in | Wt 203.0 lb

## 2021-03-10 DIAGNOSIS — B20 Human immunodeficiency virus [HIV] disease: Secondary | ICD-10-CM

## 2021-03-10 DIAGNOSIS — E782 Mixed hyperlipidemia: Secondary | ICD-10-CM

## 2021-03-10 DIAGNOSIS — E1165 Type 2 diabetes mellitus with hyperglycemia: Secondary | ICD-10-CM

## 2021-03-10 DIAGNOSIS — I2510 Atherosclerotic heart disease of native coronary artery without angina pectoris: Secondary | ICD-10-CM | POA: Diagnosis not present

## 2021-03-10 DIAGNOSIS — I2583 Coronary atherosclerosis due to lipid rich plaque: Secondary | ICD-10-CM

## 2021-03-10 MED ORDER — CABOTEGRAVIR & RILPIVIRINE ER 600 & 900 MG/3ML IM SUER
1.0000 | INTRAMUSCULAR | 1 refills | Status: DC
  Filled 2021-03-10 (×2): qty 6, 30d supply, fill #0
  Filled 2021-04-11: qty 6, 30d supply, fill #1

## 2021-03-10 MED ORDER — CABOTEGRAVIR & RILPIVIRINE ER 600 & 900 MG/3ML IM SUER
1.0000 | INTRAMUSCULAR | 5 refills | Status: DC
  Filled 2021-03-10 (×2): qty 6, 60d supply, fill #0
  Filled 2021-05-30: qty 6, 30d supply, fill #0
  Filled 2021-08-08: qty 6, 30d supply, fill #1
  Filled 2021-10-08: qty 6, 30d supply, fill #2
  Filled 2021-11-09 – 2021-12-04 (×2): qty 6, 30d supply, fill #3
  Filled 2022-01-18 – 2022-02-06 (×4): qty 6, 30d supply, fill #4

## 2021-03-10 NOTE — Telephone Encounter (Signed)
[facility] Patient Advocate Encounter   Was successful in obtaining a Vi[employer] copay card for [place].  This copay card will make the patients copay 0.00.  I have spoken with the patient.    The billing information is as follows and has been shared with [place].       [name], [facility] Patient [facility] Phone: [phone] Fax:  [phone]

## 2021-03-10 NOTE — Patient Instructions (Signed)
We will determine followup schedule based on cabenuva injections

## 2021-03-10 NOTE — Progress Notes (Signed)
Subjective:      Patient ID: [name], male    DOB: Jan 26, 1966, 55 y.o.   MRN: [id number]    [name] is a 55 year old Caucasian man living with HIV that is perfectly controlled on [place].    As he was a year ago he is interested in [place].       Review of Systems  Constitutional:  Negative for activity change, appetite change, chills, diaphoresis, fatigue, fever and unexpected weight change.  HENT:  Negative for congestion, rhinorrhea, sinus pressure, sneezing, sore throat and trouble swallowing.   Eyes:  Negative for photophobia and visual disturbance.  Respiratory:  Negative for cough, chest tightness, shortness of breath, wheezing and stridor.   Cardiovascular:  Negative for chest pain, palpitations and leg swelling.  Gastrointestinal:  Negative for abdominal distention, abdominal pain, anal bleeding, blood in stool, constipation, diarrhea, nausea and vomiting.  Genitourinary:  Negative for difficulty urinating, dysuria, flank pain and hematuria.  Musculoskeletal:  Negative for arthralgias, back pain, gait problem, joint swelling and myalgias.  Skin:  Negative for color change, pallor, rash and wound.  Neurological:  Negative for dizziness, tremors, weakness and light-headedness.  Hematological:  Negative for adenopathy. Does not bruise/bleed easily.  Psychiatric/Behavioral:  Negative for agitation, behavioral problems, confusion, decreased concentration, dysphoric mood and sleep disturbance.       Objective:   Physical Exam Constitutional:      Appearance: He is well-developed.  HENT:     Head: Normocephalic and atraumatic.  Eyes:     Conjunctiva/sclera: Conjunctivae normal.  Cardiovascular:     Rate and Rhythm: Normal rate and regular rhythm.  Pulmonary:     Effort: Pulmonary effort is normal. No respiratory distress.     Breath sounds: No wheezing.  Abdominal:     General: There is no distension.     Palpations: Abdomen is soft.  Musculoskeletal:         General: No tenderness. Normal range of motion.     Cervical back: Normal range of motion and neck supple.  Skin:    General: Skin is warm and dry.     Coloration: Skin is not pale.     Findings: No erythema or rash.  Neurological:     General: No focal deficit present.     Mental Status: He is alert and oriented to person, place, and time.  Psychiatric:        Mood and Affect: Mood normal.        Behavior: Behavior normal.        Thought Content: Thought content normal.        Judgment: Judgment normal.          Assessment & Plan:   HIV disease:  I reviewed [name]'s labs from 10/25/022 and his viral load was undetectable and CD4 1066  I am continuing his Biktarvy UNTIL we can obtain Cabenuva  We will have him come in and get labs including viral load and CD4 count CBC and BMP at the visit where he initiates cavenuva and thereafter I would want HIV VL with each injection visit  DM followed by PCP  CAD followed by PCP and Cardiology  I spent 40 minutes with the patient including greater than 50% the time in face to face counseling of the patient and the nature of oral antivirals versus long-acting therapy, + and - of each. I did reiterate the roughly 1% failure rate that occurred in clinical trials despite on time injections, however different options  for frequency of dosing including monthly versus every 31-month injections and he would like to go with Q2M after loading dosesalong with  review of medical records before and during the visit and in coordination of his care.

## 2021-03-11 ENCOUNTER — Telehealth: Payer: Self-pay

## 2021-03-11 NOTE — Telephone Encounter (Signed)
[facility] Patient Advocate Encounter  Patient's medication(Cabenuva) have been couriered to [facility] from [place] and will be administered on patient next office visit on 03/18/21.  [name] , [facility] Patient [facility] for Infectious Disease Phone: [phone] Fax:  [phone]

## 2021-03-14 ENCOUNTER — Other Ambulatory Visit (HOSPITAL_COMMUNITY): Payer: Self-pay

## 2021-03-15 ENCOUNTER — Other Ambulatory Visit (HOSPITAL_COMMUNITY): Payer: Self-pay

## 2021-03-18 ENCOUNTER — Ambulatory Visit (INDEPENDENT_AMBULATORY_CARE_PROVIDER_SITE_OTHER): Payer: 59 | Admitting: Family Medicine

## 2021-03-18 ENCOUNTER — Encounter (INDEPENDENT_AMBULATORY_CARE_PROVIDER_SITE_OTHER): Payer: Self-pay | Admitting: Family Medicine

## 2021-03-18 ENCOUNTER — Ambulatory Visit (INDEPENDENT_AMBULATORY_CARE_PROVIDER_SITE_OTHER): Payer: 59 | Admitting: Pharmacist

## 2021-03-18 ENCOUNTER — Other Ambulatory Visit: Payer: Self-pay

## 2021-03-18 VITALS — BP 121/69 | HR 89 | Temp 98.9°F | Ht 66.0 in | Wt 196.0 lb

## 2021-03-18 DIAGNOSIS — K0889 Other specified disorders of teeth and supporting structures: Secondary | ICD-10-CM | POA: Diagnosis not present

## 2021-03-18 DIAGNOSIS — E119 Type 2 diabetes mellitus without complications: Secondary | ICD-10-CM

## 2021-03-18 DIAGNOSIS — Z79899 Other long term (current) drug therapy: Secondary | ICD-10-CM | POA: Diagnosis not present

## 2021-03-18 DIAGNOSIS — E669 Obesity, unspecified: Secondary | ICD-10-CM

## 2021-03-18 DIAGNOSIS — E1169 Type 2 diabetes mellitus with other specified complication: Secondary | ICD-10-CM

## 2021-03-18 DIAGNOSIS — B20 Human immunodeficiency virus [HIV] disease: Secondary | ICD-10-CM

## 2021-03-18 DIAGNOSIS — Z794 Long term (current) use of insulin: Secondary | ICD-10-CM

## 2021-03-18 DIAGNOSIS — Z6832 Body mass index (BMI) 32.0-32.9, adult: Secondary | ICD-10-CM

## 2021-03-18 MED ORDER — CABOTEGRAVIR & RILPIVIRINE ER 600 & 900 MG/3ML IM SUER
1.0000 | Freq: Once | INTRAMUSCULAR | Status: AC
  Administered 2021-03-18: 1 via INTRAMUSCULAR

## 2021-03-18 NOTE — Progress Notes (Signed)
HPI: [name] is a 55 y.o. male who presents to the [place] clinic for [name] administration.  Patient Active Problem List   Diagnosis Date Noted   DKA (diabetic ketoacidosis) ([place]) 03/05/2020   High anion gap metabolic acidosis 19/62/2297   Leukocytosis 03/05/2020   Dehydration 03/05/2020   Hyponatremia 03/05/2020   Hyperkalemia 03/05/2020   Hyperglycemia 03/05/2020   Nausea & vomiting 03/05/2020   DKA, type 2 ([place]) 03/05/2020   Other fatigue 03/14/2018   Shortness of breath on exertion 03/14/2018   Uncontrolled type 2 diabetes mellitus with hyperglycemia ([place]) 02/08/2018   Mixed hyperlipidemia 02/08/2018   AKI (acute kidney injury) ([place]) 03/23/2017   Special screening for malignant neoplasms, colon 11/18/2016   Family history of colon cancer 11/18/2016   Lung nodule, multiple 09/27/2014   Poorly controlled type 2 diabetes mellitus with circulatory disorder ([place]) 09/27/2014   Early syphilis, 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   Hypercholesterolemia 01/25/2013   Ejection fraction    Essential hypertension, benign    Human immunodeficiency virus (HIV) disease ([place]) 09/11/2011   Kidney stone on left side 01/04/2011   DKA (diabetic ketoacidoses) 01/04/2011   Coronary artery disease due to lipid rich plaque    Secondary syphilis    LFTs abnormal     Allergies: Allergies  Allergen Reactions   Pepcid [Famotidine] Other (See Comments)    Contraindicated with ODEFSEY   Prilosec [Omeprazole] Other (See Comments)    Contraindicated with RPV (lowers level of this ARV in ODEFSEY   Odefsey [Emtricitabine-Rilpivirine-Tenofovir Af]     Makes blood sugars run high    Past Medical History: Past Medical History:  Diagnosis Date   CAD (coronary artery disease)    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  Tobacco Use   Smoking status: Never   Smokeless tobacco: Never  Vaping Use   Vaping Use: Never used  Substance and Sexual Activity   Alcohol use: Not Currently    Alcohol/week: 0.0 - 1.0 standard drinks    Comment: Social drinker, vodka, once a month   Drug use: No   Sexual activity: Not Currently    Partners: Male    Comment: declined condoms  Other Topics Concern   Not on file  Social History Narrative   Not on file   Social Determinants of Health   Financial Resource Strain: Not on file  Food Insecurity: Not on file  Transportation Needs: Not on file  Physical Activity: Not on file  Stress: Not on file  Social Connections: Not on file    Labs: Lab Results  Component Value Date   HIV1RNAQUANT <20 03/18/2020   HIV1RNAQUANT <  20 NOT DETECTED 03/14/2019   HIV1RNAQUANT <20 DETECTED (A) 02/22/2018   CD4TABS 1,066 03/18/2020   CD4TABS 880 03/14/2019   CD4TABS 1,050 02/22/2018    RPR and STI Lab Results  Component Value Date   LABRPR NON-REACTIVE 03/18/2020   LABRPR NON-REACTIVE 03/14/2019   LABRPR NON-REACTIVE 02/22/2018   LABRPR NON REAC 11/30/2016   LABRPR NON REAC 10/28/2015    STI Results GC CT  02/22/2018 Negative Negative  11/30/2016 Negative Negative  10/28/2015 Negative Negative  09/13/2014 Negative Negative    Hepatitis B No results found for: HEPBSAB, HEPBSAG,  HEPBCAB Hepatitis C No results found for: HEPCAB, HCVRNAPCRQN Hepatitis A No results found for: HAV Lipids: Lab Results  Component Value Date   CHOL 186 09/23/2020   TRIG 216 (H) 09/23/2020   HDL 45 09/23/2020   CHOLHDL 4.1 09/23/2020   VLDL 59 (H) 10/28/2015   LDLCALC 104 (H) 09/23/2020    Current HIV Regimen: Biktarvy  TARGET DATE: The 25th of the month  Assessment: [name] presents today for their first initiation injection for Cabenuva. Counseled that [place] is two separate intramuscular injections in the gluteal muscle on each side for each visit. Explained that the second injection is 30 days after the initial injection then every 2 months thereafter. Discussed the need for viral load monitoring every 2 months for the first 6 months and then periodically afterwards as their provider sees the need. Discussed the rare but significant chance of developing resistance despite compliance. Explained that showing up to injection appointments is very important and warned that if 2 appointments are missed, it will be reassessed by their provider whether they are a good candidate for injection therapy. Counseled on possible side effects associated with the injections such as injection site pain, which is usually mild to moderate in nature, injection site nodules, and injection site reactions. Asked to call the clinic or send me a mychart message if they experience any issues, such as fatigue, nausea, headache, rash, or dizziness. Advised that they can take ibuprofen or tylenol for injection site pain if needed.   Administered cabotegravir 600mg /17mL in left upper outer quadrant of the gluteal muscle. Administered rilpivirine 900 mg/78mL in the right upper outer quadrant of the gluteal muscle. Monitored patient for 10 minutes after injection. Injections were tolerated well without issue. Counseled to stop taking Biktarvy after today's dose and to call with any issues that may arise. Will make follow  up appointments for second initiation injection in 30 days and then maintenance injections every 2 months thereafter for 6 months.   Patient has already received his flu shot and bivalent COVID booster. The best days for him to come in for injections are Mondays.  Plan: - Stop Biktarvy - First Cabenuva injections administered - Second initiation injection scheduled for 04/21/21 - Maintenance injections scheduled for 06/16/21 and 08/18/21 - Call with any issues or questions  Brix Brearley L. Maryanne Huneycutt, PharmD, BCIDP, AAHIVP, [place] Clinical Pharmacist Practitioner [facility] for Infectious Disease

## 2021-03-19 ENCOUNTER — Other Ambulatory Visit (HOSPITAL_COMMUNITY): Payer: Self-pay

## 2021-03-19 LAB — T-HELPER CELL (CD4) - (RCID CLINIC ONLY)
CD4 % Helper T Cell: 39 % (ref 33–65)
CD4 T Cell Abs: 897 /uL (ref 400–1790)

## 2021-03-19 MED ORDER — TIRZEPATIDE 15 MG/0.5ML ~~LOC~~ SOAJ
15.0000 mg | SUBCUTANEOUS | 0 refills | Status: DC
  Filled 2021-03-19 – 2021-08-03 (×2): qty 2, 28d supply, fill #0

## 2021-03-19 MED ORDER — TIRZEPATIDE 12.5 MG/0.5ML ~~LOC~~ SOAJ
12.5000 mg | SUBCUTANEOUS | 0 refills | Status: DC
  Filled 2021-03-19: qty 2, 28d supply, fill #0

## 2021-03-20 ENCOUNTER — Other Ambulatory Visit (HOSPITAL_COMMUNITY): Payer: Self-pay

## 2021-03-20 NOTE — Progress Notes (Signed)
Chief Complaint:   OBESITY [name] is here to discuss his progress with his obesity treatment plan along with follow-up of his obesity related diagnoses. See Medical Weight Management Flowsheet for complete bioelectrical impedance results.  Today's visit was #: 31 Starting weight: 199 lbs Starting date: 03/14/2018 Weight change since last visit: +6 lbs Total lbs lost to date: 3 lbs Total weight loss percentage to date: -1.51%  Nutrition Plan: Category 3 Plan for 60% of the time. Activity: Walking for 45-60 minutes 2-3 times per week.  Anti-obesity medications: Mounjaro 7.5 mg subcutaneously weekly. Reported side effects: None.  Interim History: [name] reports dentalgia - loose teeth.  He has been working with a Optometrist.    He is on [place] and says he is tolerating it.  His blood sugars are improving.  He is still having polyphagia.    Assessment/Plan:   1. Type 2 diabetes mellitus with other specified complication, with long-term current use of insulin ([name]) Diabetes Mellitus: Not at goal. Medication: Mounjaro . Issues reviewed: blood sugar goals, complications of diabetes mellitus, hypoglycemia prevention and treatment, exercise, and nutrition.  Plan:  Increase Mounjaro to 12.5 mg subcutaneously weekly, as per below. The patient will continue to focus on protein-rich, low simple carbohydrate foods. We reviewed the importance of hydration, regular exercise for stress reduction, and restorative sleep.   Lab Results  Component Value Date   HGBA1C 9.2 (H) 09/23/2020   HGBA1C 8.8 (H) 01/23/2020   HGBA1C 8.0 (H) 09/26/2019   Lab Results  Component Value Date   MICROALBUR 1.08 12/20/2013   LDLCALC 104 (H) 09/23/2020   CREATININE 0.82 03/18/2021   - Increase tirzepatide (MOUNJARO) 12.5 MG/0.5ML Pen; Inject 12.5 mg into the skin once a week.  Dispense: 2 mL; Refill: 0 - tirzepatide (MOUNJARO) 15 MG/0.5ML Pen; Inject 15 mg into the skin once a week.  Dispense: 2 mL; Refill:  0  2. [name] is working with a Optometrist. This issue directly impacts care plan for optimization of BMI and metabolic health as it impacts the patient's ability to make lifestyle changes. We will continue to monitor symptoms as they relate to his weight loss journey.  3. Obesity with current BMI of 31.6  Course: [name] is currently in the action stage of change. As such, his goal is to continue with weight loss efforts.   Nutrition goals: He has agreed to practicing portion control and making smarter food choices, such as increasing vegetables and decreasing simple carbohydrates - soft foods.  Exercise goals: For substantial health benefits, adults should do at least 150 minutes (2 hours and 30 minutes) a week of moderate-intensity, or 75 minutes (1 hour and 15 minutes) a week of vigorous-intensity aerobic physical activity, or an equivalent combination of moderate- and vigorous-intensity aerobic activity. Aerobic activity should be performed in episodes of at least 10 minutes, and preferably, it should be spread throughout the week.  Behavioral modification strategies: increasing lean protein intake, decreasing simple carbohydrates, and increasing vegetables.  [name] has agreed to follow-up with our clinic in 4 weeks. He was informed of the importance of frequent follow-up visits to maximize his success with intensive lifestyle modifications for his multiple health conditions.   Objective:   Blood pressure 121/69, pulse 89, temperature 98.9 F (37.2 C), temperature source Oral, height 5\' 6"  (1.676 m), weight 196 lb (88.9 kg), SpO2 96 %. Body mass index is 31.64 kg/m.  General: Cooperative, alert, well developed, in no acute distress. HEENT: Conjunctivae and lids unremarkable.  Cardiovascular: Regular rhythm.  Lungs: Normal work of breathing. Neurologic: No focal deficits.   Lab Results  Component Value Date   CREATININE 0.82 03/18/2021   BUN 26 (H) 03/18/2021   NA 138  03/18/2021   K 4.3 03/18/2021   CL 102 03/18/2021   CO2 25 03/18/2021   Lab Results  Component Value Date   ALT 27 09/23/2020   AST 22 09/23/2020   ALKPHOS 76 09/23/2020   BILITOT 0.3 09/23/2020   Lab Results  Component Value Date   HGBA1C 9.2 (H) 09/23/2020   HGBA1C 8.8 (H) 01/23/2020   HGBA1C 8.0 (H) 09/26/2019   HGBA1C 9.6 (H) 06/27/2019   HGBA1C 9.1 (H) 02/13/2019   Lab Results  Component Value Date   INSULIN 2.2 (L) 09/23/2020   Lab Results  Component Value Date   TSH 1.240 02/13/2019   Lab Results  Component Value Date   CHOL 186 09/23/2020   HDL 45 09/23/2020   LDLCALC 104 (H) 09/23/2020   TRIG 216 (H) 09/23/2020   CHOLHDL 4.1 09/23/2020   Lab Results  Component Value Date   VD25OH 27.4 (L) 09/23/2020   VD25OH 24.8 (L) 01/23/2020   VD25OH 45.0 09/26/2019   Lab Results  Component Value Date   WBC 6.4 03/18/2021   HGB 15.2 03/18/2021   HCT 45.1 03/18/2021   MCV 86.2 03/18/2021   PLT 301 03/18/2021   Attestation Statements:   Reviewed by clinician on day of visit: allergies, medications, problem list, medical history, surgical history, family history, social history, and previous encounter notes.  I, Water quality scientist, CMA, am acting as transcriptionist for Briscoe Deutscher, DO  I have reviewed the above documentation for accuracy and completeness, and I agree with the above. -  Briscoe Deutscher, DO, MS, FAAFP, DABOM - Family and Bariatric Medicine.

## 2021-03-21 LAB — CBC
HCT: 45.1 % (ref 38.5–50.0)
Hemoglobin: 15.2 g/dL (ref 13.2–17.1)
MCH: 29.1 pg (ref 27.0–33.0)
MCHC: 33.7 g/dL (ref 32.0–36.0)
MCV: 86.2 fL (ref 80.0–100.0)
MPV: 9.2 fL (ref 7.5–12.5)
Platelets: 301 10*3/uL (ref 140–400)
RBC: 5.23 10*6/uL (ref 4.20–5.80)
RDW: 12.2 % (ref 11.0–15.0)
WBC: 6.4 10*3/uL (ref 3.8–10.8)

## 2021-03-21 LAB — BASIC METABOLIC PANEL
BUN/Creatinine Ratio: 32 (calc) — ABNORMAL HIGH (ref 6–22)
BUN: 26 mg/dL — ABNORMAL HIGH (ref 7–25)
CO2: 25 mmol/L (ref 20–32)
Calcium: 9.4 mg/dL (ref 8.6–10.3)
Chloride: 102 mmol/L (ref 98–110)
Creat: 0.82 mg/dL (ref 0.70–1.30)
Glucose, Bld: 215 mg/dL — ABNORMAL HIGH (ref 65–99)
Potassium: 4.3 mmol/L (ref 3.5–5.3)
Sodium: 138 mmol/L (ref 135–146)

## 2021-03-21 LAB — HIV-1 RNA QUANT-NO REFLEX-BLD
HIV 1 RNA Quant: 53 Copies/mL — ABNORMAL HIGH
HIV-1 RNA Quant, Log: 1.72 Log cps/mL — ABNORMAL HIGH

## 2021-03-26 ENCOUNTER — Encounter (INDEPENDENT_AMBULATORY_CARE_PROVIDER_SITE_OTHER): Payer: Self-pay

## 2021-03-26 ENCOUNTER — Other Ambulatory Visit (HOSPITAL_COMMUNITY): Payer: Self-pay

## 2021-03-26 ENCOUNTER — Other Ambulatory Visit (INDEPENDENT_AMBULATORY_CARE_PROVIDER_SITE_OTHER): Payer: Self-pay | Admitting: Family Medicine

## 2021-03-26 DIAGNOSIS — E559 Vitamin D deficiency, unspecified: Secondary | ICD-10-CM

## 2021-03-26 MED ORDER — VITAMIN D (ERGOCALCIFEROL) 1.25 MG (50000 UNIT) PO CAPS
50000.0000 [IU] | ORAL_CAPSULE | ORAL | 0 refills | Status: DC
  Filled 2021-03-26: qty 8, 28d supply, fill #0

## 2021-03-26 NOTE — Telephone Encounter (Signed)
Pt last seen by Dr. Wallace.  

## 2021-03-26 NOTE — Telephone Encounter (Signed)
Msg sent to pt 

## 2021-03-31 DIAGNOSIS — E1122 Type 2 diabetes mellitus with diabetic chronic kidney disease: Secondary | ICD-10-CM | POA: Diagnosis not present

## 2021-03-31 DIAGNOSIS — I10 Essential (primary) hypertension: Secondary | ICD-10-CM | POA: Diagnosis not present

## 2021-04-08 ENCOUNTER — Other Ambulatory Visit (INDEPENDENT_AMBULATORY_CARE_PROVIDER_SITE_OTHER): Payer: Self-pay | Admitting: Family Medicine

## 2021-04-08 DIAGNOSIS — E1169 Type 2 diabetes mellitus with other specified complication: Secondary | ICD-10-CM

## 2021-04-09 ENCOUNTER — Other Ambulatory Visit (HOSPITAL_COMMUNITY): Payer: Self-pay

## 2021-04-09 ENCOUNTER — Encounter (INDEPENDENT_AMBULATORY_CARE_PROVIDER_SITE_OTHER): Payer: Self-pay

## 2021-04-09 NOTE — Telephone Encounter (Signed)
Msg sent to pt 

## 2021-04-10 ENCOUNTER — Other Ambulatory Visit (HOSPITAL_COMMUNITY): Payer: Self-pay

## 2021-04-11 ENCOUNTER — Other Ambulatory Visit (HOSPITAL_COMMUNITY): Payer: Self-pay

## 2021-04-14 ENCOUNTER — Telehealth: Payer: Self-pay

## 2021-04-14 ENCOUNTER — Other Ambulatory Visit (HOSPITAL_COMMUNITY): Payer: Self-pay

## 2021-04-14 NOTE — Telephone Encounter (Signed)
[facility] Patient Advocate Encounter  Patient's medication (Cabenuva)have been couriered to [facility] from [place] and will be administered on patient next office visit on 04/21/21.  [name] , [facility] Patient [facility] for Infectious Disease Phone: [phone] Fax:  [phone]

## 2021-04-16 ENCOUNTER — Other Ambulatory Visit (HOSPITAL_COMMUNITY): Payer: Self-pay

## 2021-04-21 ENCOUNTER — Other Ambulatory Visit (HOSPITAL_COMMUNITY): Payer: Self-pay

## 2021-04-21 ENCOUNTER — Ambulatory Visit (INDEPENDENT_AMBULATORY_CARE_PROVIDER_SITE_OTHER): Payer: 59 | Admitting: Family Medicine

## 2021-04-21 ENCOUNTER — Other Ambulatory Visit: Payer: Self-pay

## 2021-04-21 ENCOUNTER — Ambulatory Visit (INDEPENDENT_AMBULATORY_CARE_PROVIDER_SITE_OTHER): Payer: 59 | Admitting: Pharmacist

## 2021-04-21 ENCOUNTER — Encounter (INDEPENDENT_AMBULATORY_CARE_PROVIDER_SITE_OTHER): Payer: Self-pay | Admitting: Family Medicine

## 2021-04-21 VITALS — BP 129/69 | HR 88 | Temp 98.0°F | Ht 66.0 in | Wt 191.0 lb

## 2021-04-21 DIAGNOSIS — R058 Other specified cough: Secondary | ICD-10-CM

## 2021-04-21 DIAGNOSIS — B20 Human immunodeficiency virus [HIV] disease: Secondary | ICD-10-CM | POA: Diagnosis not present

## 2021-04-21 DIAGNOSIS — E1169 Type 2 diabetes mellitus with other specified complication: Secondary | ICD-10-CM | POA: Diagnosis not present

## 2021-04-21 DIAGNOSIS — F411 Generalized anxiety disorder: Secondary | ICD-10-CM | POA: Diagnosis not present

## 2021-04-21 DIAGNOSIS — Z79899 Other long term (current) drug therapy: Secondary | ICD-10-CM

## 2021-04-21 DIAGNOSIS — E785 Hyperlipidemia, unspecified: Secondary | ICD-10-CM | POA: Diagnosis not present

## 2021-04-21 DIAGNOSIS — Z794 Long term (current) use of insulin: Secondary | ICD-10-CM | POA: Diagnosis not present

## 2021-04-21 DIAGNOSIS — E559 Vitamin D deficiency, unspecified: Secondary | ICD-10-CM

## 2021-04-21 DIAGNOSIS — Z6831 Body mass index (BMI) 31.0-31.9, adult: Secondary | ICD-10-CM

## 2021-04-21 DIAGNOSIS — K0889 Other specified disorders of teeth and supporting structures: Secondary | ICD-10-CM | POA: Diagnosis not present

## 2021-04-21 DIAGNOSIS — E669 Obesity, unspecified: Secondary | ICD-10-CM | POA: Diagnosis not present

## 2021-04-21 MED ORDER — PROPRANOLOL HCL 20 MG PO TABS
20.0000 mg | ORAL_TABLET | Freq: Three times a day (TID) | ORAL | 0 refills | Status: DC
  Filled 2021-04-21: qty 90, 30d supply, fill #0

## 2021-04-21 MED ORDER — CABOTEGRAVIR & RILPIVIRINE ER 600 & 900 MG/3ML IM SUER
1.0000 | Freq: Once | INTRAMUSCULAR | Status: AC
  Administered 2021-04-21: 09:00:00 1 via INTRAMUSCULAR

## 2021-04-21 MED ORDER — TRESIBA FLEXTOUCH 100 UNIT/ML ~~LOC~~ SOPN
PEN_INJECTOR | SUBCUTANEOUS | 12 refills | Status: DC
  Filled 2021-04-21: qty 30, 33d supply, fill #0

## 2021-04-21 MED ORDER — BENZONATATE 100 MG PO CAPS
100.0000 mg | ORAL_CAPSULE | Freq: Three times a day (TID) | ORAL | 0 refills | Status: DC
  Filled 2021-04-21: qty 30, 10d supply, fill #0

## 2021-04-21 MED ORDER — TIRZEPATIDE 15 MG/0.5ML ~~LOC~~ SOAJ
15.0000 mg | SUBCUTANEOUS | 0 refills | Status: DC
  Filled 2021-04-21: qty 6, 84d supply, fill #0

## 2021-04-21 MED ORDER — ESCITALOPRAM OXALATE 10 MG PO TABS
10.0000 mg | ORAL_TABLET | Freq: Every day | ORAL | 0 refills | Status: DC
  Filled 2021-04-21: qty 90, 90d supply, fill #0

## 2021-04-21 NOTE — Progress Notes (Signed)
HPI: [name] is a 55 y.o. male who presents to the [place] clinic for [place] administration.  Patient Active Problem List   Diagnosis Date Noted   DKA (diabetic ketoacidosis) ([place]) 03/05/2020   High anion gap metabolic acidosis 16/02/9603   Leukocytosis 03/05/2020   Dehydration 03/05/2020   Hyponatremia 03/05/2020   Hyperkalemia 03/05/2020   Hyperglycemia 03/05/2020   Nausea & vomiting 03/05/2020   DKA, type 2 ([place]) 03/05/2020   Other fatigue 03/14/2018   Shortness of breath on exertion 03/14/2018   Uncontrolled type 2 diabetes mellitus with hyperglycemia ([place]) 02/08/2018   Mixed hyperlipidemia 02/08/2018   AKI (acute kidney injury) ([place]) 03/23/2017   Special screening for malignant neoplasms, colon 11/18/2016   Family history of colon cancer 11/18/2016   Lung nodule, multiple 09/27/2014   Poorly controlled type 2 diabetes mellitus with circulatory disorder ([place]) 09/27/2014   Early syphilis, 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   CABOTEGRAVIR & RILPIVIRINE ER (CABENUVA) 600 & 900 MG/3ML INJECTION    Inject 1 kit into the muscle every 30 (thirty) days.   CABOTEGRAVIR & RILPIVIRINE ER (CABENUVA) 600 & 900 MG/3ML INJECTION    Inject 1 kit into the muscle every 2 (two) months.   CHLORTHALIDONE (HYGROTON) 25 MG TABLET    Take 0.5 tablets (12.5 mg total) by mouth daily.   CONTINUOUS BLOOD GLUC SENSOR (FREESTYLE LIBRE 14 DAY SENSOR) MISC    Use as directed   ESCITALOPRAM (LEXAPRO)  10 MG TABLET    Take 1 tablet (10 mg total) by mouth daily.   EZETIMIBE (ZETIA) 10 MG TABLET    Take 1 tablet (10 mg total) by mouth daily.   INSULIN GLARGINE (BASAGLAR KWIKPEN) 100 UNIT/ML    Inject 70 units under the skin 2  times a day   INSULIN LISPRO (HUMALOG KWIKPEN) 100 UNIT/ML KWIKPEN    INJECT 35 UNITS 4 TIMES DAILY   INSULIN LISPRO (HUMALOG) 100 UNIT/ML KWIKPEN    Inject 35 Units subcutaneously 4 times a day   INSULIN PEN NEEDLE 31G X 5 MM MISC    Use as directed   LISINOPRIL (ZESTRIL) 10 MG TABLET    TAKE 1 TABLET BY MOUTH EVERY DAY   METFORMIN (GLUCOPHAGE-XR) 500 MG 24 HR TABLET    TAKE 2 TABLET BY MOUTH TWICE DAILY WITH A MEAL   METOPROLOL SUCCINATE (TOPROL-XL) 100 MG 24 HR TABLET    Take 1 tablet (100 mg total) by mouth daily.   MULTIPLE VITAMINS-MINERALS (MULTIVITAMIN WITH MINERALS) TABLET    Take 1 tablet by mouth daily.   ROSUVASTATIN (CRESTOR) 40 MG TABLET    Take 1 tablet (40 mg total) by mouth daily.   TIRZEPATIDE (MOUNJARO) 15 MG/0.5ML PEN    Inject 15 mg into the skin once a week.   VITAMIN D, ERGOCALCIFEROL, (DRISDOL) 1.25 MG (50000 UNIT) CAPS CAPSULE    Take 1 capsule by mouth 2 times a week on Sunday & Wednesday  Modified Medications   No medications on file  Discontinued Medications   No medications on file    Allergies: Allergies  Allergen Reactions   Pepcid [Famotidine] Other (See Comments)    Contraindicated with ODEFSEY   Prilosec [Omeprazole] Other (See Comments)    Contraindicated with RPV (lowers level of this ARV in ODEFSEY   Odefsey [Emtricitabine-Rilpivirine-Tenofovir Af]     Makes blood sugars run high    Past Medical History: Past Medical History:  Diagnosis Date   CAD (coronary artery disease)    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  Tobacco Use   Smoking status: Never   Smokeless tobacco: Never  Vaping Use   Vaping Use: Never used  Substance and Sexual Activity   Alcohol use: Not Currently    Alcohol/week: 0.0 - 1.0 standard drinks    Comment: Social drinker, vodka, once a month   Drug use: No   Sexual activity: Not Currently    Partners: Male    Comment: declined condoms  Other Topics Concern   Not on file  Social History Narrative   Not on file   Social Determinants of Health   Financial Resource Strain: Not on file  Food Insecurity: Not on file  Transportation Needs: Not on file  Physical Activity: Not on file  Stress: Not on file  Social Connections: Not on file    Labs: Lab Results  Component Value Date   HIV1RNAQUANT 53 (H) 03/18/2021   HIV1RNAQUANT <20 03/18/2020   HIV1RNAQUANT <20 NOT DETECTED 03/14/2019   CD4TABS 897 03/18/2021   CD4TABS 1,066 03/18/2020   CD4TABS 880 03/14/2019    RPR and STI Lab Results  Component Value Date   LABRPR NON-REACTIVE 03/18/2020   LABRPR NON-REACTIVE 03/14/2019   LABRPR NON-REACTIVE 02/22/2018   LABRPR NON REAC 11/30/2016   LABRPR NON REAC 10/28/2015    STI Results GC CT  02/22/2018 Negative Negative  11/30/2016 Negative Negative  10/28/2015 Negative Negative  09/13/2014 Negative Negative    Hepatitis B No results found for: HEPBSAB, HEPBSAG, HEPBCAB Hepatitis C No results found for: HEPCAB,  HCVRNAPCRQN Hepatitis A No results found for: HAV Lipids: Lab Results  Component Value Date   CHOL 186 09/23/2020   TRIG 216 (H) 09/23/2020   HDL 45 09/23/2020   CHOLHDL 4.1 09/23/2020   VLDL 59 (H) 10/28/2015   LDLCALC 104 (H) 09/23/2020    TARGET DATE: 25th of the month  Assessment: [name] presents today for their maintenance Cabenuva injections. Initial injections were tolerated well without issues. No problems with systemic side effects of injections. Patient did experience soreness at the administration site for a few  days following the injection.   Administered cabotegravir 651m/3mL in left upper outer quadrant of the gluteal muscle. Administered rilpivirine 900 mg/323min the right upper outer quadrant of the gluteal muscle. Monitored patient for 10 minutes after injection. Injections were tolerated well without issue. Patient will follow up in 2 months for next injection. No need for labs today given just drawn 1 month ago.   Plan: - Cabenuva injections administered - Next injections scheduled for 06/16/21, 08/18/21 with Cassie  - Call with any issues or questions   MaRebbeca PaulPharmD PGY2 Ambulatory Care Pharmacy Resident 04/21/2021 9:01 AM

## 2021-04-21 NOTE — Progress Notes (Signed)
Chief Complaint:   OBESITY [name] is here to discuss his progress with his obesity treatment plan along with follow-up of his obesity related diagnoses. See Medical Weight Management Flowsheet for complete bioelectrical impedance results.  Today's visit was #: 10 Starting weight: 199 lbs Starting date: 03/14/2018 Weight change since last visit: 5 lbs Total lbs lost to date: 8 lbs Total weight loss percentage to date: -4.02%  Nutrition Plan: Category 3 Plan for 40% of the time.  Activity: Walking for 30-45 minutes 2-3 times per week.  Anti-obesity medications: Mounjaro 12.5 mg subcutaneously weekly. Reported side effects: None.  Interim History: [name] says he will be losing 9 teeth.  He will drink [employer] protein shakes, soups and broths.  He reports that his FBS was 64 this morning.  BP elevated today due to severe anxiety.    Assessment/Plan:   1. Type 2 diabetes mellitus with other specified complication, with long-term current use of insulin (HCC) Diabetes Mellitus: Not at goal. Medication: Mounjaro 12.5 mg subcutaneously weekly. Issues reviewed: blood sugar goals, complications of diabetes mellitus, hypoglycemia prevention and treatment, exercise, and nutrition.  Plan:  Increase Mounjaro to 15 mg subcutaneously weekly, as per below. The patient will continue to focus on protein-rich, low simple carbohydrate foods. We reviewed the importance of hydration, regular exercise for stress reduction, and restorative sleep.  Will check labs today.  Lab Results  Component Value Date   HGBA1C 9.2 (H) 09/23/2020   HGBA1C 8.8 (H) 01/23/2020   HGBA1C 8.0 (H) 09/26/2019   Lab Results  Component Value Date   MICROALBUR 1.08 12/20/2013   LDLCALC 104 (H) 09/23/2020   CREATININE 0.82 03/18/2021   - CBC with Differential/Platelet - Comprehensive metabolic panel - Hemoglobin A1c - Microalbumin / creatinine urine ratio - Increase tirzepatide (MOUNJARO) 15 MG/0.5ML Pen; Inject 15 mg  into the skin once a week.  Dispense: 6 mL; Refill: 0  2. Vitamin D deficiency Not at goal.  He is taking vitamin D 50,000 IU weekly.  Plan: Continue to take prescription Vitamin D @50 ,000 IU every week as prescribed.  Will check vitamin D level today.  Lab Results  Component Value Date   VD25OH 27.4 (L) 09/23/2020   VD25OH 24.8 (L) 01/23/2020   VD25OH 45.0 09/26/2019   - VITAMIN D 25 Hydroxy (Vit-D Deficiency, Fractures)  3. Other cough Start Tessalon 100 mg three times daily for cough.  - Start benzonatate (TESSALON) 100 MG capsule; Take 1 capsule (100 mg total) by mouth 3 (three) times daily.  Dispense: 30 capsule; Refill: 0  4. Hyperlipidemia associated with type 2 diabetes mellitus ([name]) Course: Not at goal. Lipid-lowering medications: Zetia 10 mg daily and Crestor 40 mg daily.   Plan: Dietary changes: Increase soluble fiber, decrease simple carbohydrates, decrease saturated fat. Exercise changes: Moderate to vigorous-intensity aerobic activity 150 minutes per week or as tolerated. We will continue to monitor along with PCP/specialists as it pertains to his weight loss journey.  Lab Results  Component Value Date   CHOL 186 09/23/2020   HDL 45 09/23/2020   LDLCALC 104 (H) 09/23/2020   TRIG 216 (H) 09/23/2020   CHOLHDL 4.1 09/23/2020   Lab Results  Component Value Date   ALT 27 09/23/2020   AST 22 09/23/2020   ALKPHOS 76 09/23/2020   BILITOT 0.3 09/23/2020   The 10-year ASCVD risk score (Arnett DK, et al., 2019) is: 12.5%   Values used to calculate the score:     Age: 55 years  Sex: Male     Is Non-Hispanic African American: No     Diabetic: Yes     Tobacco smoker: No     Systolic Blood Pressure: 478 mmHg     Is BP treated: Yes     HDL Cholesterol: 45 mg/dL     Total Cholesterol: 186 mg/dL  - Lipid panel  5. Medication management Will check labs today, as per below.  - Vitamin B12 - TSH - T4, free  6. Dentalgia [name] will be losing 9 teeth and will  be on a liquid diet for some time.  7. GAD (generalized anxiety disorder) [name] is taking Lexapro 10 mg daily.  He is having significant anxiety today, and his blood pressure is elevated.    Plan:  Continue Lexapro 10 mg daily.  Will refill today.  Start propranolol 20 mg three times daily, as per below.  - Refill escitalopram (LEXAPRO) 10 MG tablet; Take 1 tablet (10 mg total) by mouth daily.  Dispense: 90 tablet; Refill: 0 - Start propranolol (INDERAL) 20 MG tablet; Take 1 tablet (20 mg total) by mouth 3 (three) times daily.  Dispense: 90 tablet; Refill: 0  8. Obesity BMI today is 30  Course: [name] is currently in the action stage of change. As such, his goal is to continue with weight loss efforts.   Nutrition goals: He has agreed to a liquid diet.   Exercise goals:  As is.  Behavioral modification strategies: increasing lean protein intake, decreasing simple carbohydrates, increasing vegetables, and increasing water intake.  [name] has agreed to follow-up with our clinic in 3-4 weeks. He was informed of the importance of frequent follow-up visits to maximize his success with intensive lifestyle modifications for his multiple health conditions.   Objective:   Blood pressure 129/69, pulse 88, temperature 98 F (36.7 C), temperature source Oral, height 5\' 6"  (1.676 m), weight 191 lb (86.6 kg), SpO2 96 %. Body mass index is 30.83 kg/m.  General: Cooperative, alert, well developed, in no acute distress. HEENT: Conjunctivae and lids unremarkable. Cardiovascular: Regular rhythm.  Lungs: Normal work of breathing. Neurologic: No focal deficits.   Lab Results  Component Value Date   CREATININE 0.82 03/18/2021   BUN 26 (H) 03/18/2021   NA 138 03/18/2021   K 4.3 03/18/2021   CL 102 03/18/2021   CO2 25 03/18/2021   Lab Results  Component Value Date   ALT 27 09/23/2020   AST 22 09/23/2020   ALKPHOS 76 09/23/2020   BILITOT 0.3 09/23/2020   Lab Results  Component Value Date    HGBA1C 9.2 (H) 09/23/2020   HGBA1C 8.8 (H) 01/23/2020   HGBA1C 8.0 (H) 09/26/2019   HGBA1C 9.6 (H) 06/27/2019   HGBA1C 9.1 (H) 02/13/2019   Lab Results  Component Value Date   INSULIN 2.2 (L) 09/23/2020   Lab Results  Component Value Date   TSH 1.240 02/13/2019   Lab Results  Component Value Date   CHOL 186 09/23/2020   HDL 45 09/23/2020   LDLCALC 104 (H) 09/23/2020   TRIG 216 (H) 09/23/2020   CHOLHDL 4.1 09/23/2020   Lab Results  Component Value Date   VD25OH 27.4 (L) 09/23/2020   VD25OH 24.8 (L) 01/23/2020   VD25OH 45.0 09/26/2019   Lab Results  Component Value Date   WBC 6.4 03/18/2021   HGB 15.2 03/18/2021   HCT 45.1 03/18/2021   MCV 86.2 03/18/2021   PLT 301 03/18/2021   Attestation Statements:   Reviewed by clinician on  day of visit: allergies, medications, problem list, medical history, surgical history, family history, social history, and previous encounter notes.  Time spent on visit including pre-visit chart review and post-visit care and documentation was 42 minutes. Time was spent on: preparing to see the patient (eg, review of tests), obtaining and/or reviewing separately obtained history, performing a medically necessary appropriate examination and/or evaluation, counseling and educating the patient/family/caregiver, ordering medications, tests, or procedures, and documenting clinical information in the electronic or other health.   I, Water quality scientist, CMA, am acting as transcriptionist for Briscoe Deutscher, DO  I have reviewed the above documentation for accuracy and completeness, and I agree with the above. -  Briscoe Deutscher, DO, MS, FAAFP, DABOM - Family and Bariatric Medicine.

## 2021-04-22 ENCOUNTER — Other Ambulatory Visit (HOSPITAL_COMMUNITY): Payer: Self-pay

## 2021-04-22 LAB — VITAMIN B12: Vitamin B-12: 273 pg/mL (ref 232–1245)

## 2021-04-22 LAB — COMPREHENSIVE METABOLIC PANEL
ALT: 16 IU/L (ref 0–44)
AST: 16 IU/L (ref 0–40)
Albumin/Globulin Ratio: 1.4 (ref 1.2–2.2)
Albumin: 3.8 g/dL (ref 3.8–4.9)
Alkaline Phosphatase: 80 IU/L (ref 44–121)
BUN/Creatinine Ratio: 18 (ref 9–20)
BUN: 14 mg/dL (ref 6–24)
Bilirubin Total: 0.3 mg/dL (ref 0.0–1.2)
CO2: 23 mmol/L (ref 20–29)
Calcium: 9.2 mg/dL (ref 8.7–10.2)
Chloride: 103 mmol/L (ref 96–106)
Creatinine, Ser: 0.8 mg/dL (ref 0.76–1.27)
Globulin, Total: 2.7 g/dL (ref 1.5–4.5)
Glucose: 169 mg/dL — ABNORMAL HIGH (ref 70–99)
Potassium: 4.1 mmol/L (ref 3.5–5.2)
Sodium: 145 mmol/L — ABNORMAL HIGH (ref 134–144)
Total Protein: 6.5 g/dL (ref 6.0–8.5)
eGFR: 105 mL/min/{1.73_m2} (ref >59)

## 2021-04-22 LAB — CBC WITH DIFFERENTIAL/PLATELET
Basophils Absolute: 0.1 10*3/uL (ref 0.0–0.2)
Basos: 1 %
EOS (ABSOLUTE): 0.2 10*3/uL (ref 0.0–0.4)
Eos: 4 %
Hematocrit: 46 % (ref 37.5–51.0)
Hemoglobin: 15.1 g/dL (ref 13.0–17.7)
Immature Grans (Abs): 0 10*3/uL (ref 0.0–0.1)
Immature Granulocytes: 0 %
Lymphocytes Absolute: 1.8 10*3/uL (ref 0.7–3.1)
Lymphs: 31 %
MCH: 28.5 pg (ref 26.6–33.0)
MCHC: 32.8 g/dL (ref 31.5–35.7)
MCV: 87 fL (ref 79–97)
Monocytes Absolute: 0.8 10*3/uL (ref 0.1–0.9)
Monocytes: 14 %
Neutrophils Absolute: 3 10*3/uL (ref 1.4–7.0)
Neutrophils: 50 %
Platelets: 289 10*3/uL (ref 150–450)
RBC: 5.3 x10E6/uL (ref 4.14–5.80)
RDW: 12.2 % (ref 11.6–15.4)
WBC: 6 10*3/uL (ref 3.4–10.8)

## 2021-04-22 LAB — MICROALBUMIN / CREATININE URINE RATIO
Creatinine, Urine: 330 mg/dL
Microalb/Creat Ratio: 73 mg/g creat — ABNORMAL HIGH (ref 0–29)
Microalbumin, Urine: 240.8 ug/mL

## 2021-04-22 LAB — HEMOGLOBIN A1C
Est. average glucose Bld gHb Est-mCnc: 214 mg/dL
Hgb A1c MFr Bld: 9.1 % — ABNORMAL HIGH (ref 4.8–5.6)

## 2021-04-22 LAB — LIPID PANEL
Chol/HDL Ratio: 3.6 ratio (ref 0.0–5.0)
Cholesterol, Total: 138 mg/dL (ref 100–199)
HDL: 38 mg/dL — ABNORMAL LOW (ref >39)
LDL Chol Calc (NIH): 73 mg/dL (ref 0–99)
Triglycerides: 156 mg/dL — ABNORMAL HIGH (ref 0–149)
VLDL Cholesterol Cal: 27 mg/dL (ref 5–40)

## 2021-04-22 LAB — TSH: TSH: 1.62 u[IU]/mL (ref 0.450–4.500)

## 2021-04-22 LAB — T4, FREE: Free T4: 1.12 ng/dL (ref 0.82–1.77)

## 2021-04-22 LAB — VITAMIN D 25 HYDROXY (VIT D DEFICIENCY, FRACTURES): Vit D, 25-Hydroxy: 49.9 ng/mL (ref 30.0–100.0)

## 2021-04-24 ENCOUNTER — Other Ambulatory Visit (HOSPITAL_COMMUNITY): Payer: Self-pay

## 2021-04-25 ENCOUNTER — Other Ambulatory Visit (HOSPITAL_COMMUNITY): Payer: Self-pay

## 2021-04-27 ENCOUNTER — Other Ambulatory Visit (HOSPITAL_COMMUNITY): Payer: Self-pay

## 2021-04-30 ENCOUNTER — Other Ambulatory Visit (HOSPITAL_COMMUNITY): Payer: Self-pay

## 2021-05-08 ENCOUNTER — Other Ambulatory Visit (HOSPITAL_COMMUNITY): Payer: Self-pay

## 2021-05-08 MED ORDER — DEXCOM G6 SENSOR MISC
12 refills | Status: DC
  Filled 2021-05-08: qty 3, 30d supply, fill #0

## 2021-05-08 MED ORDER — DEXCOM G6 TRANSMITTER MISC
4 refills | Status: DC
  Filled 2021-05-08: qty 1, 90d supply, fill #0

## 2021-05-12 ENCOUNTER — Other Ambulatory Visit (HOSPITAL_COMMUNITY): Payer: Self-pay

## 2021-05-13 ENCOUNTER — Other Ambulatory Visit (HOSPITAL_COMMUNITY): Payer: Self-pay

## 2021-05-22 ENCOUNTER — Other Ambulatory Visit (HOSPITAL_COMMUNITY): Payer: Self-pay

## 2021-05-27 ENCOUNTER — Inpatient Hospital Stay (HOSPITAL_COMMUNITY)
Admission: EM | Admit: 2021-05-27 | Discharge: 2021-05-30 | DRG: 638 | Disposition: A | Discharge status: Home / Self Care | Payer: 59 | Attending: Internal Medicine | Admitting: Internal Medicine

## 2021-05-27 ENCOUNTER — Encounter (HOSPITAL_COMMUNITY): Payer: Self-pay

## 2021-05-27 ENCOUNTER — Emergency Department (HOSPITAL_COMMUNITY): Payer: 59

## 2021-05-27 ENCOUNTER — Other Ambulatory Visit: Payer: Self-pay

## 2021-05-27 DIAGNOSIS — E870 Hyperosmolality and hypernatremia: Secondary | ICD-10-CM | POA: Diagnosis present

## 2021-05-27 DIAGNOSIS — R61 Generalized hyperhidrosis: Secondary | ICD-10-CM | POA: Diagnosis not present

## 2021-05-27 DIAGNOSIS — E11649 Type 2 diabetes mellitus with hypoglycemia without coma: Secondary | ICD-10-CM | POA: Diagnosis present

## 2021-05-27 DIAGNOSIS — Z833 Family history of diabetes mellitus: Secondary | ICD-10-CM | POA: Diagnosis not present

## 2021-05-27 DIAGNOSIS — E1165 Type 2 diabetes mellitus with hyperglycemia: Secondary | ICD-10-CM | POA: Diagnosis not present

## 2021-05-27 DIAGNOSIS — R112 Nausea with vomiting, unspecified: Secondary | ICD-10-CM | POA: Diagnosis not present

## 2021-05-27 DIAGNOSIS — I252 Old myocardial infarction: Secondary | ICD-10-CM | POA: Diagnosis not present

## 2021-05-27 DIAGNOSIS — Z21 Asymptomatic human immunodeficiency virus [HIV] infection status: Secondary | ICD-10-CM | POA: Diagnosis present

## 2021-05-27 DIAGNOSIS — Z6372 Alcoholism and drug addiction in family: Secondary | ICD-10-CM | POA: Diagnosis not present

## 2021-05-27 DIAGNOSIS — R231 Pallor: Secondary | ICD-10-CM | POA: Diagnosis not present

## 2021-05-27 DIAGNOSIS — E860 Dehydration: Secondary | ICD-10-CM | POA: Diagnosis present

## 2021-05-27 DIAGNOSIS — E875 Hyperkalemia: Secondary | ICD-10-CM | POA: Diagnosis present

## 2021-05-27 DIAGNOSIS — Z20822 Contact with and (suspected) exposure to covid-19: Secondary | ICD-10-CM | POA: Diagnosis present

## 2021-05-27 DIAGNOSIS — Z87442 Personal history of urinary calculi: Secondary | ICD-10-CM

## 2021-05-27 DIAGNOSIS — E8729 Other acidosis: Secondary | ICD-10-CM | POA: Diagnosis present

## 2021-05-27 DIAGNOSIS — E871 Hypo-osmolality and hyponatremia: Secondary | ICD-10-CM | POA: Diagnosis present

## 2021-05-27 DIAGNOSIS — D72829 Elevated white blood cell count, unspecified: Secondary | ICD-10-CM | POA: Diagnosis not present

## 2021-05-27 DIAGNOSIS — Z7984 Long term (current) use of oral hypoglycemic drugs: Secondary | ICD-10-CM

## 2021-05-27 DIAGNOSIS — Z8249 Family history of ischemic heart disease and other diseases of the circulatory system: Secondary | ICD-10-CM | POA: Diagnosis not present

## 2021-05-27 DIAGNOSIS — E1110 Type 2 diabetes mellitus with ketoacidosis without coma: Principal | ICD-10-CM | POA: Diagnosis present

## 2021-05-27 DIAGNOSIS — E876 Hypokalemia: Secondary | ICD-10-CM | POA: Diagnosis present

## 2021-05-27 DIAGNOSIS — Z9114 Patient's other noncompliance with medication regimen: Secondary | ICD-10-CM

## 2021-05-27 DIAGNOSIS — Z811 Family history of alcohol abuse and dependence: Secondary | ICD-10-CM | POA: Diagnosis not present

## 2021-05-27 DIAGNOSIS — I2510 Atherosclerotic heart disease of native coronary artery without angina pectoris: Secondary | ICD-10-CM | POA: Diagnosis present

## 2021-05-27 DIAGNOSIS — R55 Syncope and collapse: Secondary | ICD-10-CM | POA: Diagnosis not present

## 2021-05-27 DIAGNOSIS — Z79899 Other long term (current) drug therapy: Secondary | ICD-10-CM

## 2021-05-27 DIAGNOSIS — N179 Acute kidney failure, unspecified: Secondary | ICD-10-CM | POA: Diagnosis not present

## 2021-05-27 DIAGNOSIS — T383X6A Underdosing of insulin and oral hypoglycemic [antidiabetic] drugs, initial encounter: Secondary | ICD-10-CM | POA: Diagnosis present

## 2021-05-27 DIAGNOSIS — R739 Hyperglycemia, unspecified: Secondary | ICD-10-CM | POA: Diagnosis not present

## 2021-05-27 DIAGNOSIS — Z888 Allergy status to other drugs, medicaments and biological substances status: Secondary | ICD-10-CM

## 2021-05-27 DIAGNOSIS — D75839 Thrombocytosis, unspecified: Secondary | ICD-10-CM | POA: Diagnosis present

## 2021-05-27 DIAGNOSIS — I10 Essential (primary) hypertension: Secondary | ICD-10-CM | POA: Diagnosis present

## 2021-05-27 DIAGNOSIS — R7989 Other specified abnormal findings of blood chemistry: Secondary | ICD-10-CM | POA: Diagnosis present

## 2021-05-27 DIAGNOSIS — R1111 Vomiting without nausea: Secondary | ICD-10-CM | POA: Diagnosis not present

## 2021-05-27 DIAGNOSIS — R000 Tachycardia, unspecified: Secondary | ICD-10-CM | POA: Diagnosis not present

## 2021-05-27 DIAGNOSIS — Z83438 Family history of other disorder of lipoprotein metabolism and other lipidemia: Secondary | ICD-10-CM

## 2021-05-27 DIAGNOSIS — Z955 Presence of coronary angioplasty implant and graft: Secondary | ICD-10-CM

## 2021-05-27 DIAGNOSIS — Z794 Long term (current) use of insulin: Secondary | ICD-10-CM | POA: Diagnosis not present

## 2021-05-27 DIAGNOSIS — E782 Mixed hyperlipidemia: Secondary | ICD-10-CM | POA: Diagnosis present

## 2021-05-27 LAB — CBC
HCT: 49.4 % (ref 39.0–52.0)
Hemoglobin: 15.5 g/dL (ref 13.0–17.0)
MCH: 28.5 pg (ref 26.0–34.0)
MCHC: 31.4 g/dL (ref 30.0–36.0)
MCV: 91 fL (ref 80.0–100.0)
Platelets: 436 10*3/uL — ABNORMAL HIGH (ref 150–400)
RBC: 5.43 MIL/uL (ref 4.22–5.81)
RDW: 13.2 % (ref 11.5–15.5)
WBC: 19.7 10*3/uL — ABNORMAL HIGH (ref 4.0–10.5)
nRBC: 0 % (ref 0.0–0.2)

## 2021-05-27 LAB — BASIC METABOLIC PANEL
Anion gap: 32 — ABNORMAL HIGH (ref 5–15)
BUN: 33 mg/dL — ABNORMAL HIGH (ref 6–20)
CO2: <7 mmol/L — ABNORMAL LOW (ref 22–32)
Calcium: 9.5 mg/dL (ref 8.9–10.3)
Chloride: 98 mmol/L (ref 98–111)
Creatinine, Ser: 1.76 mg/dL — ABNORMAL HIGH (ref 0.61–1.24)
GFR, Estimated: 45 mL/min — ABNORMAL LOW (ref >60)
Glucose, Bld: 757 mg/dL (ref 70–99)
Potassium: 5.2 mmol/L — ABNORMAL HIGH (ref 3.5–5.1)
Sodium: 135 mmol/L (ref 135–145)

## 2021-05-27 LAB — BLOOD GAS, VENOUS
Acid-base deficit: 25 mmol/L — ABNORMAL HIGH (ref 0.0–2.0)
Bicarbonate: 6.7 mmol/L — ABNORMAL LOW (ref 20.0–28.0)
Drawn by: 6643
FIO2: 21
O2 Saturation: 82.9 %
Patient temperature: 36.6
pCO2, Ven: 19.2 mmHg — CL (ref 44.0–60.0)
pH, Ven: 6.978 — CL (ref 7.250–7.430)
pO2, Ven: 71.2 mmHg — ABNORMAL HIGH (ref 32.0–45.0)

## 2021-05-27 LAB — CBG MONITORING, ED: Glucose-Capillary: >600 mg/dL (ref 70–99)

## 2021-05-27 LAB — BETA-HYDROXYBUTYRIC ACID: Beta-Hydroxybutyric Acid: >8 mmol/L — ABNORMAL HIGH (ref 0.05–0.27)

## 2021-05-27 MED ORDER — ONDANSETRON HCL 4 MG/2ML IJ SOLN
4.0000 mg | Freq: Once | INTRAMUSCULAR | Status: AC
  Administered 2021-05-27: 4 mg via INTRAVENOUS
  Filled 2021-05-27: qty 2

## 2021-05-27 MED ORDER — SODIUM CHLORIDE 0.9 % IV BOLUS
1000.0000 mL | Freq: Once | INTRAVENOUS | Status: AC
  Administered 2021-05-27: 1000 mL via INTRAVENOUS

## 2021-05-27 MED ORDER — LACTATED RINGERS IV BOLUS
20.0000 mL/kg | Freq: Once | INTRAVENOUS | Status: AC
  Administered 2021-05-27: 1542 mL via INTRAVENOUS

## 2021-05-27 MED ORDER — ONDANSETRON HCL 4 MG/2ML IJ SOLN: 4.0000 mg | Freq: Once | INTRAMUSCULAR | Status: DC

## 2021-05-27 MED ORDER — ONDANSETRON 4 MG PO TBDP
4.0000 mg | ORAL_TABLET | Freq: Once | ORAL | Status: AC
  Administered 2021-05-27: 4 mg via ORAL
  Filled 2021-05-27: qty 1

## 2021-05-27 NOTE — ED Triage Notes (Signed)
Rcems from home with cc 490 with ems. Took insulin around 1pm.  N/v since Sunday can't keep anything down.   Tachycardic, diaphoretic,  and feeling faint in triage.

## 2021-05-27 NOTE — ED Notes (Signed)
ED Provider at bedside. 

## 2021-05-27 NOTE — ED Provider Notes (Signed)
[facility] EMERGENCY DEPARTMENT Provider Note   CSN: [id number] Arrival date & time: 05/27/21  2148     History  Chief Complaint  Patient presents with   Emesis   Hyperglycemia    [name] is a 56 y.o. male.  The history is provided by the patient.  Emesis Hyperglycemia Associated symptoms: vomiting   He has history of hypertension, diabetes, hyperlipidemia and comes in with vomiting for the last 2 days.  He has not been able to hold anything down.  He denies pain anywhere and denies fever or chills or sweats.  He states that he has not been taking his insulin.  He has not done anything at home to treat his symptoms.  He is complaining of being very thirsty.  He does have history of ketoacidosis.   Home Medications Prior to Admission medications   Medication Sig Start Date End Date Taking? Authorizing Provider  aspirin 81 MG tablet Take 1 tablet (81 mg total) by mouth daily with breakfast. 12/31/19   Rehman, Mechele Dawley, MD  benzonatate (TESSALON) 100 MG capsule Take 1 capsule (100 mg total) by mouth 3 (three) times daily. 04/21/21   Briscoe Deutscher, DO  cabotegravir & rilpivirine ER (CABENUVA) 600 & 900 MG/3ML injection Inject 1 kit into the muscle every 2 (two) months. 03/10/21   Kuppelweiser, Cassie L, RPH-CPP  chlorthalidone (HYGROTON) 25 MG tablet Take 0.5 tablets (12.5 mg total) by mouth daily. 09/09/20   Satira Sark, MD  Continuous Blood Gluc Sensor (DEXCOM G6 SENSOR) MISC Use to test daily 05/08/21     Continuous Blood Gluc Sensor (FREESTYLE LIBRE 14 DAY SENSOR) MISC Use as directed 01/30/21     Continuous Blood Gluc Transmit (DEXCOM G6 TRANSMITTER) MISC Use to test daily 05/08/21     escitalopram (LEXAPRO) 10 MG tablet Take 1 tablet (10 mg total) by mouth daily. 04/21/21   Briscoe Deutscher, DO  ezetimibe (ZETIA) 10 MG tablet Take 1 tablet (10 mg total) by mouth daily. 12/24/20 07/08/21  Satira Sark, MD  insulin degludec (TRESIBA FLEXTOUCH) 100 UNIT/ML FlexTouch Pen  INJECT 45 UNITS TWICE DAILY 04/21/21     Insulin Glargine (BASAGLAR KWIKPEN) 100 UNIT/ML Inject 70 units under the skin 2  times a day 01/30/21     insulin lispro (HUMALOG KWIKPEN) 100 UNIT/ML KwikPen INJECT 35 UNITS 4 TIMES DAILY 01/30/21     insulin lispro (HUMALOG) 100 UNIT/ML KwikPen Inject 35 Units subcutaneously 4 times a day 10/15/20     Insulin Pen Needle 31G X 5 MM MISC Use as directed 10/15/20     lisinopril (ZESTRIL) 10 MG tablet TAKE 1 TABLET BY MOUTH EVERY DAY 09/09/20 09/09/21  Satira Sark, MD  metFORMIN (GLUCOPHAGE-XR) 500 MG 24 hr tablet TAKE 2 TABLET BY MOUTH TWICE DAILY WITH A MEAL 11/04/20 11/04/21  Briscoe Deutscher, DO  metoprolol succinate (TOPROL-XL) 100 MG 24 hr tablet Take 1 tablet (100 mg total) by mouth daily. 09/09/20   Satira Sark, MD  Multiple Vitamins-Minerals (MULTIVITAMIN WITH MINERALS) tablet Take 1 tablet by mouth daily.    [provider]  propranolol (INDERAL) 20 MG tablet Take 1 tablet (20 mg total) by mouth 3 (three) times daily. 04/21/21   Briscoe Deutscher, DO  rosuvastatin (CRESTOR) 40 MG tablet Take 1 tablet (40 mg total) by mouth daily. 09/09/20   Satira Sark, MD  tirzepatide [facility]) 15 MG/0.5ML Pen Inject 15 mg into the skin once a week. 03/19/21   Briscoe Deutscher, DO  tirzepatide (MOUNJARO) 15 MG/0.5ML Pen Inject 15 mg into the skin once a week. 04/21/21   Briscoe Deutscher, DO  Vitamin D, Ergocalciferol, (DRISDOL) 1.25 MG (50000 UNIT) CAPS capsule Take 1 capsule by mouth 2 times a week on Sunday & Wednesday 03/27/21   Briscoe Deutscher, DO      Allergies    Pepcid [famotidine], Prilosec [omeprazole], and Odefsey [emtricitabine-rilpivirine-tenofovir af]    Review of Systems   Review of Systems  Gastrointestinal:  Positive for vomiting.  All other systems reviewed and are negative.  Physical Exam Updated Vital Signs BP (!) 158/67 (BP Location: Right Arm)    Pulse (!) 129    Temp 97.9 F (36.6 C) (Tympanic)    Resp (!) 22    Ht _0   (1.676 m)    Wt 77.1 kg    SpO2 98%    BMI 27.44 kg/m  Physical Exam Vitals and nursing note reviewed.  Ill-appearing 56 year old male, tachypneic at rest and unable to complete a sentence without stopping for another breath. Vital signs are significant for elevated blood pressure, heart rate, respiratory rate. Oxygen saturation is 98%, which is normal. Head is normocephalic and atraumatic. PERRLA, EOMI. Oropharynx is clear.  Mucous membranes are dry. Neck is nontender and supple without adenopathy or JVD. Back is nontender and there is no CVA tenderness. Lungs are clear without rales, wheezes, or rhonchi. Chest is nontender. Heart is tachycardic without murmur. Abdomen is soft, flat, nontender without masses or hepatosplenomegaly and peristalsis is hypoactive. Extremities have no cyanosis or edema, full range of motion is present. Skin is warm and dry without rash. Neurologic: Mental status is normal, cranial nerves are intact, moves all extremities equally.  ED Results / Procedures / Treatments   Labs (all labs ordered are listed, but only abnormal results are displayed) Labs Reviewed  BASIC METABOLIC PANEL - Abnormal; Notable for the following components:      Result Value   Potassium 5.2 (*)    CO2 <7 (*)    Glucose, Bld 757 (*)    BUN 33 (*)    Creatinine, Ser 1.76 (*)    GFR, Estimated 45 (*)    Anion gap 32.0 (*)    All other components within normal limits  CBC - Abnormal; Notable for the following components:   WBC 19.7 (*)    Platelets 436 (*)    All other components within normal limits  URINALYSIS, ROUTINE W REFLEX MICROSCOPIC - Abnormal; Notable for the following components:   Color, Urine STRAW (*)    Glucose, UA >=500 (*)    Hgb urine dipstick MODERATE (*)    Ketones, ur 80 (*)    Protein, ur 30 (*)    All other components within normal limits  BLOOD GAS, VENOUS - Abnormal; Notable for the following components:   pH, Ven 6.978 (*)    pCO2, Ven 19.2 (*)     pO2, Ven 71.2 (*)    Bicarbonate 6.7 (*)    Acid-base deficit 25.0 (*)    All other components within normal limits  BETA-HYDROXYBUTYRIC ACID - Abnormal; Notable for the following components:   Beta-Hydroxybutyric Acid >8.00 (*)    All other components within normal limits  MAGNESIUM - Abnormal; Notable for the following components:   Magnesium 2.5 (*)    All other components within normal limits  PHOSPHORUS - Abnormal; Notable for the following components:   Phosphorus 8.1 (*)    All other components within normal limits  HEPATIC FUNCTION PANEL - Abnormal; Notable for the following components:   Total Bilirubin 2.3 (*)    All other components within normal limits  BASIC METABOLIC PANEL - Abnormal; Notable for the following components:   CO2 <7 (*)    Glucose, Bld 605 (*)    BUN 39 (*)    Creatinine, Ser 2.17 (*)    GFR, Estimated 35 (*)    All other components within normal limits  BLOOD GAS, VENOUS - Abnormal; Notable for the following components:   pH, Ven 7.080 (*)    pCO2, Ven 20.5 (*)    pO2, Ven 45.9 (*)    Bicarbonate 8.2 (*)    Acid-base deficit 22.4 (*)    All other components within normal limits  CBG MONITORING, ED - Abnormal; Notable for the following components:   Glucose-Capillary >600 (*)    All other components within normal limits  CBG MONITORING, ED - Abnormal; Notable for the following components:   Glucose-Capillary >600 (*)    All other components within normal limits  CBG MONITORING, ED - Abnormal; Notable for the following components:   Glucose-Capillary >600 (*)    All other components within normal limits  CBG MONITORING, ED - Abnormal; Notable for the following components:   Glucose-Capillary >600 (*)    All other components within normal limits  CBG MONITORING, ED - Abnormal; Notable for the following components:   Glucose-Capillary >600 (*)    All other components within normal limits  CBG MONITORING, ED - Abnormal; Notable for the following  components:   Glucose-Capillary >600 (*)    All other components within normal limits  CBG MONITORING, ED - Abnormal; Notable for the following components:   Glucose-Capillary >600 (*)    All other components within normal limits  CBG MONITORING, ED - Abnormal; Notable for the following components:   Glucose-Capillary >600 (*)    All other components within normal limits  CBG MONITORING, ED - Abnormal; Notable for the following components:   Glucose-Capillary 587 (*)    All other components within normal limits  CBG MONITORING, ED - Abnormal; Notable for the following components:   Glucose-Capillary 555 (*)    All other components within normal limits  CBG MONITORING, ED - Abnormal; Notable for the following components:   Glucose-Capillary 496 (*)    All other components within normal limits  CBG MONITORING, ED - Abnormal; Notable for the following components:   Glucose-Capillary 481 (*)    All other components within normal limits  CBG MONITORING, ED - Abnormal; Notable for the following components:   Glucose-Capillary 347 (*)    All other components within normal limits  RESP PANEL BY RT-PCR (FLU A&B, COVID) ARPGX2  LIPASE, BLOOD  OSMOLALITY  TROPONIN I (HIGH SENSITIVITY)    EKG EKG Interpretation  Date/Time:  Wednesday May 28 2021 00:05:12 EST Ventricular Rate:  123 PR Interval:  145 QRS Duration: 109 QT Interval:  337 QTC Calculation: 483 R Axis:   -38 Text Interpretation: Sinus tachycardia Incomplete RBBB and LAFB Consider right ventricular hypertrophy Borderline prolonged QT interval Baseline wander in lead(s) V1 When compared with ECG of 03/05/2020, No significant change was found Confirmed by Delora Fuel ([id number]) on 05/28/2021 12:21:58 AM  Radiology DG Chest Port 1 View  Result Date: 05/27/2021 CLINICAL DATA:  Tachycardic, diaphoretic, and feeling faint in triage. Pt also has hx of CAD, HTN. EXAM: PORTABLE CHEST 1 VIEW COMPARISON:  Chest x-ray 01/08/2021, CT  chest 12/30/2006 FINDINGS: The heart and  mediastinal contours are unchanged. Aortic calcifications. No focal consolidation. No pulmonary edema. No pleural effusion. No pneumothorax. No acute osseous abnormality. IMPRESSION: No active disease. Electronically Signed   By: Iven Finn M.D.   On: 05/27/2021 23:43    Procedures Procedures  Cardiac monitor was ordered and reviewed by me showing sinus tachycardia.  Medications Ordered in ED Medications  ondansetron (ZOFRAN-ODT) disintegrating tablet 4 mg (4 mg Oral Given 05/27/21 2215)  sodium chloride 0.9 % bolus 1,000 mL (1,000 mLs Intravenous New Bag/Given 05/27/21 2215)    ED Course/ Medical Decision Making/ A&P                           Medical Decision Making  Nausea and vomiting with tachypnea concerning for diabetic ketoacidosis.  Consider viral gastroenteritis, small bowel obstruction.  Old records are reviewed showing several hospitalizations for ketoacidosis.  Capillary blood glucose was greater than 600, consistent with ketoacidosis.  IV is started and IV fluids are given and also given dose of ondansetron.  VBG has come back showing pH of 6.98 consistent with ketoacidosis.  Additional labs have been ordered and are pending.  We will also check chest x-ray and ECG to look for possible triggering factors.  Creatinine is elevated compared with baseline, likely secondary to dehydration.  It is concerning that sodium is normal in the face of hyperglycemia (total glucose 757).  WBC is significantly elevated but with normal hemoglobin.  Beta hydroxybutyrate is greater than 8, consistent with ketoacidosis.  ECG shows sinus tachycardia, incomplete right bundle branch block, left anterior fascicular block and unchanged from prior.  Chest x-ray shows no evidence of pneumonia.  Repeat venous blood gas shows improving pH.  Repeat metabolic panel shows slightly worsening of creatinine.  We will need to continue IV hydration.  Case is discussed with Dr.  Josephine Cables of [facility], who agrees to admit the patient.  CRITICAL CARE Performed by: Delora Fuel Total critical care time: 95 minutes Critical care time was exclusive of separately billable procedures and treating other patients. Critical care was necessary to treat or prevent imminent or life-threatening deterioration. Critical care was time spent personally by me on the following activities: development of treatment plan with patient and/or surrogate as well as nursing, discussions with consultants, evaluation of patient's response to treatment, examination of patient, obtaining history from patient or surrogate, ordering and performing treatments and interventions, ordering and review of laboratory studies, ordering and review of radiographic studies, pulse oximetry and re-evaluation of patient's condition.       Final Clinical Impression(s) / ED Diagnoses Final diagnoses:  DKA (diabetic ketoacidosis) ([place])  Acute kidney injury (nontraumatic) ([place])    Rx / DC Orders ED Discharge Orders     None         Delora Fuel, MD 16/10/96 [phone]

## 2021-05-27 NOTE — ED Notes (Signed)
Date and time results received: 05/27/21 11:02 PM   Test: pH venous Critical Value: 6.978  Test-pco2 venous Critical-19.2  Name of Provider Notified:   Orders Received? Or Actions Taken?:

## 2021-05-28 ENCOUNTER — Encounter (HOSPITAL_COMMUNITY): Payer: Self-pay | Admitting: Internal Medicine

## 2021-05-28 DIAGNOSIS — E871 Hypo-osmolality and hyponatremia: Secondary | ICD-10-CM | POA: Diagnosis present

## 2021-05-28 DIAGNOSIS — R112 Nausea with vomiting, unspecified: Secondary | ICD-10-CM | POA: Diagnosis not present

## 2021-05-28 DIAGNOSIS — Z811 Family history of alcohol abuse and dependence: Secondary | ICD-10-CM | POA: Diagnosis not present

## 2021-05-28 DIAGNOSIS — I2510 Atherosclerotic heart disease of native coronary artery without angina pectoris: Secondary | ICD-10-CM | POA: Diagnosis present

## 2021-05-28 DIAGNOSIS — E860 Dehydration: Secondary | ICD-10-CM

## 2021-05-28 DIAGNOSIS — E11649 Type 2 diabetes mellitus with hypoglycemia without coma: Secondary | ICD-10-CM | POA: Diagnosis present

## 2021-05-28 DIAGNOSIS — E1110 Type 2 diabetes mellitus with ketoacidosis without coma: Secondary | ICD-10-CM | POA: Diagnosis not present

## 2021-05-28 DIAGNOSIS — Z8249 Family history of ischemic heart disease and other diseases of the circulatory system: Secondary | ICD-10-CM | POA: Diagnosis not present

## 2021-05-28 DIAGNOSIS — E1165 Type 2 diabetes mellitus with hyperglycemia: Secondary | ICD-10-CM | POA: Diagnosis not present

## 2021-05-28 DIAGNOSIS — E8729 Other acidosis: Secondary | ICD-10-CM

## 2021-05-28 DIAGNOSIS — E875 Hyperkalemia: Secondary | ICD-10-CM

## 2021-05-28 DIAGNOSIS — E876 Hypokalemia: Secondary | ICD-10-CM | POA: Diagnosis present

## 2021-05-28 DIAGNOSIS — I10 Essential (primary) hypertension: Secondary | ICD-10-CM | POA: Diagnosis present

## 2021-05-28 DIAGNOSIS — D72829 Elevated white blood cell count, unspecified: Secondary | ICD-10-CM

## 2021-05-28 DIAGNOSIS — I252 Old myocardial infarction: Secondary | ICD-10-CM | POA: Diagnosis not present

## 2021-05-28 DIAGNOSIS — Z21 Asymptomatic human immunodeficiency virus [HIV] infection status: Secondary | ICD-10-CM | POA: Diagnosis present

## 2021-05-28 DIAGNOSIS — Z833 Family history of diabetes mellitus: Secondary | ICD-10-CM | POA: Diagnosis not present

## 2021-05-28 DIAGNOSIS — E782 Mixed hyperlipidemia: Secondary | ICD-10-CM | POA: Diagnosis present

## 2021-05-28 DIAGNOSIS — D75839 Thrombocytosis, unspecified: Secondary | ICD-10-CM | POA: Diagnosis not present

## 2021-05-28 DIAGNOSIS — E870 Hyperosmolality and hypernatremia: Secondary | ICD-10-CM | POA: Diagnosis present

## 2021-05-28 DIAGNOSIS — Z794 Long term (current) use of insulin: Secondary | ICD-10-CM | POA: Diagnosis not present

## 2021-05-28 DIAGNOSIS — T383X6A Underdosing of insulin and oral hypoglycemic [antidiabetic] drugs, initial encounter: Secondary | ICD-10-CM | POA: Diagnosis present

## 2021-05-28 DIAGNOSIS — Z6372 Alcoholism and drug addiction in family: Secondary | ICD-10-CM | POA: Diagnosis not present

## 2021-05-28 DIAGNOSIS — Z20822 Contact with and (suspected) exposure to covid-19: Secondary | ICD-10-CM | POA: Diagnosis present

## 2021-05-28 DIAGNOSIS — N179 Acute kidney failure, unspecified: Secondary | ICD-10-CM | POA: Diagnosis not present

## 2021-05-28 LAB — CBC
HCT: 48.5 % (ref 39.0–52.0)
Hemoglobin: 15 g/dL (ref 13.0–17.0)
MCH: 28.8 pg (ref 26.0–34.0)
MCHC: 30.9 g/dL (ref 30.0–36.0)
MCV: 93.3 fL (ref 80.0–100.0)
Platelets: 391 10*3/uL (ref 150–400)
RBC: 5.2 MIL/uL (ref 4.22–5.81)
RDW: 13.2 % (ref 11.5–15.5)
WBC: 24.8 10*3/uL — ABNORMAL HIGH (ref 4.0–10.5)
nRBC: 0 % (ref 0.0–0.2)

## 2021-05-28 LAB — GLUCOSE, CAPILLARY
Glucose-Capillary: 148 mg/dL — ABNORMAL HIGH (ref 70–99)
Glucose-Capillary: 148 mg/dL — ABNORMAL HIGH (ref 70–99)
Glucose-Capillary: 149 mg/dL — ABNORMAL HIGH (ref 70–99)
Glucose-Capillary: 162 mg/dL — ABNORMAL HIGH (ref 70–99)
Glucose-Capillary: 163 mg/dL — ABNORMAL HIGH (ref 70–99)
Glucose-Capillary: 169 mg/dL — ABNORMAL HIGH (ref 70–99)
Glucose-Capillary: 171 mg/dL — ABNORMAL HIGH (ref 70–99)
Glucose-Capillary: 178 mg/dL — ABNORMAL HIGH (ref 70–99)
Glucose-Capillary: 178 mg/dL — ABNORMAL HIGH (ref 70–99)
Glucose-Capillary: 195 mg/dL — ABNORMAL HIGH (ref 70–99)
Glucose-Capillary: 231 mg/dL — ABNORMAL HIGH (ref 70–99)
Glucose-Capillary: 282 mg/dL — ABNORMAL HIGH (ref 70–99)
Glucose-Capillary: 286 mg/dL — ABNORMAL HIGH (ref 70–99)
Glucose-Capillary: 291 mg/dL — ABNORMAL HIGH (ref 70–99)

## 2021-05-28 LAB — BASIC METABOLIC PANEL
Anion gap: 23 — ABNORMAL HIGH (ref 5–15)
Anion gap: 18 — ABNORMAL HIGH (ref 5–15)
Anion gap: 19 — ABNORMAL HIGH (ref 5–15)
Anion gap: 11 (ref 5–15)
BUN: 39 mg/dL — ABNORMAL HIGH (ref 6–20)
BUN: 40 mg/dL — ABNORMAL HIGH (ref 6–20)
BUN: 41 mg/dL — ABNORMAL HIGH (ref 6–20)
BUN: 41 mg/dL — ABNORMAL HIGH (ref 6–20)
BUN: 42 mg/dL — ABNORMAL HIGH (ref 6–20)
CO2: <7 mmol/L — ABNORMAL LOW (ref 22–32)
CO2: 12 mmol/L — ABNORMAL LOW (ref 22–32)
CO2: 18 mmol/L — ABNORMAL LOW (ref 22–32)
CO2: 16 mmol/L — ABNORMAL LOW (ref 22–32)
CO2: 22 mmol/L (ref 22–32)
Calcium: 9.3 mg/dL (ref 8.9–10.3)
Calcium: 9.4 mg/dL (ref 8.9–10.3)
Calcium: 9.2 mg/dL (ref 8.9–10.3)
Calcium: 9.1 mg/dL (ref 8.9–10.3)
Calcium: 8.9 mg/dL (ref 8.9–10.3)
Chloride: 104 mmol/L (ref 98–111)
Chloride: 108 mmol/L (ref 98–111)
Chloride: 109 mmol/L (ref 98–111)
Chloride: 110 mmol/L (ref 98–111)
Chloride: 112 mmol/L — ABNORMAL HIGH (ref 98–111)
Creatinine, Ser: 2.17 mg/dL — ABNORMAL HIGH (ref 0.61–1.24)
Creatinine, Ser: 2.03 mg/dL — ABNORMAL HIGH (ref 0.61–1.24)
Creatinine, Ser: 1.9 mg/dL — ABNORMAL HIGH (ref 0.61–1.24)
Creatinine, Ser: 1.62 mg/dL — ABNORMAL HIGH (ref 0.61–1.24)
Creatinine, Ser: 1.45 mg/dL — ABNORMAL HIGH (ref 0.61–1.24)
GFR, Estimated: 35 mL/min — ABNORMAL LOW (ref >60)
GFR, Estimated: 38 mL/min — ABNORMAL LOW (ref >60)
GFR, Estimated: 41 mL/min — ABNORMAL LOW (ref >60)
GFR, Estimated: 50 mL/min — ABNORMAL LOW (ref >60)
GFR, Estimated: 57 mL/min — ABNORMAL LOW (ref >60)
Glucose, Bld: 605 mg/dL (ref 70–99)
Glucose, Bld: 303 mg/dL — ABNORMAL HIGH (ref 70–99)
Glucose, Bld: 172 mg/dL — ABNORMAL HIGH (ref 70–99)
Glucose, Bld: 274 mg/dL — ABNORMAL HIGH (ref 70–99)
Glucose, Bld: 165 mg/dL — ABNORMAL HIGH (ref 70–99)
Potassium: 4.8 mmol/L (ref 3.5–5.1)
Potassium: 3.9 mmol/L (ref 3.5–5.1)
Potassium: 4.3 mmol/L (ref 3.5–5.1)
Potassium: 3.6 mmol/L (ref 3.5–5.1)
Potassium: 3.4 mmol/L — ABNORMAL LOW (ref 3.5–5.1)
Sodium: 139 mmol/L (ref 135–145)
Sodium: 143 mmol/L (ref 135–145)
Sodium: 145 mmol/L (ref 135–145)
Sodium: 145 mmol/L (ref 135–145)
Sodium: 145 mmol/L (ref 135–145)

## 2021-05-28 LAB — URINALYSIS, ROUTINE W REFLEX MICROSCOPIC
Bacteria, UA: NONE SEEN
Bilirubin Urine: NEGATIVE
Glucose, UA: >=500 mg/dL — AB
Ketones, ur: 80 mg/dL — AB
Leukocytes,Ua: NEGATIVE
Nitrite: NEGATIVE
Protein, ur: 30 mg/dL — AB
Specific Gravity, Urine: 1.02 (ref 1.005–1.030)
pH: 5 (ref 5.0–8.0)

## 2021-05-28 LAB — BLOOD GAS, VENOUS
Acid-base deficit: 22.4 mmol/L — ABNORMAL HIGH (ref 0.0–2.0)
Bicarbonate: 8.2 mmol/L — ABNORMAL LOW (ref 20.0–28.0)
FIO2: 21
O2 Saturation: 70.3 %
Patient temperature: 36.6
pCO2, Ven: 20.5 mmHg — ABNORMAL LOW (ref 44.0–60.0)
pH, Ven: 7.08 — CL (ref 7.250–7.430)
pO2, Ven: 45.9 mmHg — ABNORMAL HIGH (ref 32.0–45.0)

## 2021-05-28 LAB — HEPATIC FUNCTION PANEL
ALT: 22 U/L (ref 0–44)
AST: 15 U/L (ref 15–41)
Albumin: 3.9 g/dL (ref 3.5–5.0)
Alkaline Phosphatase: 93 U/L (ref 38–126)
Bilirubin, Direct: <0.1 mg/dL (ref 0.0–0.2)
Total Bilirubin: 2.3 mg/dL — ABNORMAL HIGH (ref 0.3–1.2)
Total Protein: 7.7 g/dL (ref 6.5–8.1)

## 2021-05-28 LAB — CBG MONITORING, ED
Glucose-Capillary: 327 mg/dL — ABNORMAL HIGH (ref 70–99)
Glucose-Capillary: 347 mg/dL — ABNORMAL HIGH (ref 70–99)
Glucose-Capillary: 481 mg/dL — ABNORMAL HIGH (ref 70–99)
Glucose-Capillary: 496 mg/dL — ABNORMAL HIGH (ref 70–99)
Glucose-Capillary: 555 mg/dL (ref 70–99)
Glucose-Capillary: 587 mg/dL (ref 70–99)
Glucose-Capillary: >600 mg/dL (ref 70–99)
Glucose-Capillary: >600 mg/dL (ref 70–99)
Glucose-Capillary: >600 mg/dL (ref 70–99)
Glucose-Capillary: >600 mg/dL (ref 70–99)
Glucose-Capillary: >600 mg/dL (ref 70–99)
Glucose-Capillary: >600 mg/dL (ref 70–99)
Glucose-Capillary: >600 mg/dL (ref 70–99)

## 2021-05-28 LAB — RESP PANEL BY RT-PCR (FLU A&B, COVID) ARPGX2
Influenza A by PCR: NEGATIVE
Influenza B by PCR: NEGATIVE
SARS Coronavirus 2 by RT PCR: NEGATIVE

## 2021-05-28 LAB — MRSA NEXT GEN BY PCR, NASAL: MRSA by PCR Next Gen: NOT DETECTED

## 2021-05-28 LAB — BETA-HYDROXYBUTYRIC ACID
Beta-Hydroxybutyric Acid: 7 mmol/L — ABNORMAL HIGH (ref 0.05–0.27)
Beta-Hydroxybutyric Acid: 5.98 mmol/L — ABNORMAL HIGH (ref 0.05–0.27)

## 2021-05-28 LAB — PHOSPHORUS: Phosphorus: 8.1 mg/dL — ABNORMAL HIGH (ref 2.5–4.6)

## 2021-05-28 LAB — LIPASE, BLOOD: Lipase: 24 U/L (ref 11–51)

## 2021-05-28 LAB — TROPONIN I (HIGH SENSITIVITY): Troponin I (High Sensitivity): 5 ng/L (ref <18)

## 2021-05-28 LAB — MAGNESIUM: Magnesium: 2.5 mg/dL — ABNORMAL HIGH (ref 1.7–2.4)

## 2021-05-28 LAB — OSMOLALITY: Osmolality: 350 mOsm/kg (ref 275–295)

## 2021-05-28 MED ORDER — POTASSIUM CHLORIDE 10 MEQ/100ML IV SOLN
10.0000 meq | INTRAVENOUS | Status: AC
  Administered 2021-05-28 (×2): 10 meq via INTRAVENOUS
  Filled 2021-05-28 (×2): qty 100

## 2021-05-28 MED ORDER — SODIUM BICARBONATE 8.4 % IV SOLN
INTRAVENOUS | Status: AC
  Filled 2021-05-28: qty 50

## 2021-05-28 MED ORDER — INSULIN REGULAR(HUMAN) IN NACL 100-0.9 UT/100ML-% IV SOLN
INTRAVENOUS | Status: DC
  Administered 2021-05-28: 13 [IU]/h via INTRAVENOUS
  Filled 2021-05-28 (×2): qty 100

## 2021-05-28 MED ORDER — ONDANSETRON HCL 4 MG/2ML IJ SOLN
4.0000 mg | Freq: Four times a day (QID) | INTRAMUSCULAR | Status: DC | PRN
  Administered 2021-05-28 – 2021-05-30 (×2): 4 mg via INTRAVENOUS
  Filled 2021-05-28 (×2): qty 2

## 2021-05-28 MED ORDER — LACTATED RINGERS IV SOLN: INTRAVENOUS | Status: DC

## 2021-05-28 MED ORDER — CHLORHEXIDINE GLUCONATE CLOTH 2 % EX PADS
6.0000 | MEDICATED_PAD | Freq: Every day | CUTANEOUS | Status: DC
  Administered 2021-05-28 – 2021-05-30 (×3): 6 via TOPICAL

## 2021-05-28 MED ORDER — SODIUM BICARBONATE 8.4 % IV SOLN
50.0000 meq | Freq: Once | INTRAVENOUS | Status: AC
  Administered 2021-05-28: 50 meq via INTRAVENOUS

## 2021-05-28 MED ORDER — DEXTROSE IN LACTATED RINGERS 5 % IV SOLN: INTRAVENOUS | Status: DC

## 2021-05-28 MED ORDER — SODIUM BICARBONATE 8.4 % IV SOLN
50.0000 meq | Freq: Once | INTRAVENOUS | Status: AC
Start: 2021-05-28 — End: 2021-05-28
  Administered 2021-05-28: 50 meq via INTRAVENOUS

## 2021-05-28 MED ORDER — PROCHLORPERAZINE EDISYLATE 10 MG/2ML IJ SOLN
10.0000 mg | Freq: Four times a day (QID) | INTRAMUSCULAR | Status: DC | PRN
  Administered 2021-05-28 – 2021-05-29 (×3): 10 mg via INTRAVENOUS
  Filled 2021-05-28 (×3): qty 2

## 2021-05-28 MED ORDER — METOPROLOL TARTRATE 5 MG/5ML IV SOLN
2.5000 mg | Freq: Once | INTRAVENOUS | Status: AC
  Administered 2021-05-29: 2.5 mg via INTRAVENOUS
  Filled 2021-05-28: qty 5

## 2021-05-28 MED ORDER — ENOXAPARIN SODIUM 40 MG/0.4ML IJ SOSY: 40.0000 mg | PREFILLED_SYRINGE | INTRAMUSCULAR | Status: DC

## 2021-05-28 MED ORDER — ENOXAPARIN SODIUM 40 MG/0.4ML IJ SOSY
40.0000 mg | PREFILLED_SYRINGE | INTRAMUSCULAR | Status: DC
  Administered 2021-05-28 – 2021-05-30 (×3): 40 mg via SUBCUTANEOUS
  Filled 2021-05-28 (×3): qty 0.4

## 2021-05-28 MED ORDER — DEXTROSE 50 % IV SOLN: 0.0000 mL | INTRAVENOUS | Status: DC | PRN

## 2021-05-28 NOTE — ED Notes (Signed)
Date and time results received: 05/28/21 4:34 AM  Test: pH Critical Value: 7.080  Name of Provider Notified: Dr. Josephine Cables   Orders Received? Or Actions Taken?:

## 2021-05-28 NOTE — Progress Notes (Addendum)
He complains of generalized weakness, nausea and vomiting.  He requested for ice chips.  His nurse was at the bedside.  He remains on IV fluids and IV insulin infusion.  Continue current treatment per DKA protocol.  Antiemetics as needed for nausea/vomiting.

## 2021-05-28 NOTE — H&P (Signed)
History and Physical  [name] IZT:[id number] DOB: 1966/05/25 DOA: 05/27/2021  Referring physician: Delora Fuel, MD PCP: Asencion Noble, MD  Patient coming from: Home  Chief Complaint: Elevated blood glucose level, nausea and vomiting  HPI: [name] is a 56 y.o. 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  He has not been taking his insulin due to not being able to tolerate any oral intake.  He complained of increased restless, so he presented to the ED for further evaluation and management.  ED Course:  In the emergency department, he was tachycardic and initially tachypneic.  Blood work showed leukocytosis and thrombocytosis, potassium was 5.2 and glucose was 757, bicarb was < 7 and anion gap was 32.  BUN/creatinine 23/1.76 (baseline creatinine at 0.8-1.2).  ABG showed pH of 6.978.  Influenza A, B, SARS coronavirus 2 was negative. Chest x-ray showed no active disease Patient was started on IV insulin drip per Endo tool.  Hospitalist was asked to admit patient for further evaluation and management.  Review of Systems: A full 10 point Review of Systems was done, except as stated above, all other Review of systems were negative.  Past Medical History:  Diagnosis Date   CAD (coronary artery disease)    DES to anomalous circumflex 2007; DES due to ISR anomalous circumflex 2009   Diabetes mellitus type II    Essential hypertension    History of heart attack    History of kidney stones    HIV (human immunodeficiency virus infection) ([place])    Diagnosed 2002   LFTs abnormal    Mixed hyperlipidemia    Secondary syphilis    Treated with Bicillin.. 2008...  complicated by Jarisch-Herxheimer reaction.. RPR  reverted to negative   Syphilitic hepatitis    Type 2 diabetes mellitus [facility])    Past Surgical History:  Procedure Laterality Date   BIOPSY  12/28/2019   Procedure: BIOPSY;  Surgeon: Rogene Houston, MD;  [facility];  Service: Endoscopy;;   CHOLECYSTECTOMY OPEN  07/2001   COLONOSCOPY N/A 05/28/2017   Procedure: COLONOSCOPY;  Surgeon: Rogene Houston, MD;  Location: [facility];  Service: Endoscopy;  Laterality: N/A;  200 - pt to prep in Endo   CORONARY ANGIOPLASTY WITH STENT PLACEMENT  03/2006; 10/2007;    ESOPHAGEAL DILATION N/A 12/28/2019   Procedure: ESOPHAGEAL DILATION;  Surgeon: Rogene Houston, MD;  [facility]: [facility];  Service: Endoscopy;  Laterality: N/A;   ESOPHAGOGASTRODUODENOSCOPY N/A 12/28/2019   Procedure: ESOPHAGOGASTRODUODENOSCOPY (EGD);  Surgeon: Rogene Houston, MD;  Location: [facility];  Service: Endoscopy;  Laterality: N/A;  125, per Lelon Frohlich moved to 10:55 pt aware   HERNIA REPAIR     LAPAROSCOPIC INCISIONAL / [place] / [place]  07/2001   UHR   POLYPECTOMY  05/28/2017   Procedure: POLYPECTOMY;  Surgeon: Rogene Houston, MD;  [facility]: [facility];  Service: Endoscopy;;  colon    Social History:  reports that he has never smoked. He has never used smokeless tobacco. He reports that he does not currently use alcohol. He reports that he does not use drugs.   Allergies  Allergen Reactions  Pepcid [Famotidine] Other (See Comments)    Contraindicated with ODEFSEY   Prilosec [Omeprazole] Other (See Comments)    Contraindicated with RPV (lowers level of this ARV in ODEFSEY   Odefsey [Emtricitabine-Rilpivirine-Tenofovir Af]     Makes blood sugars run high    Family History  Problem Relation Age of Onset   Heart attack Father        7 heart attacks   CAD Father    Alcoholism Father    Hyperlipidemia Mother    Hypertension Mother    Anemia Other    Diabetes Other    CAD Sister     Diabetes Sister      Prior to Admission medications   Medication Sig Start Date End Date Taking? Authorizing Provider  aspirin 81 MG tablet Take 1 tablet (81 mg total) by mouth daily with breakfast. 12/31/19   Rehman, Mechele Dawley, MD  benzonatate (TESSALON) 100 MG capsule Take 1 capsule (100 mg total) by mouth 3 (three) times daily. 04/21/21   Briscoe Deutscher, DO  cabotegravir & rilpivirine ER (CABENUVA) 600 & 900 MG/3ML injection Inject 1 kit into the muscle every 2 (two) months. 03/10/21   Kuppelweiser, Cassie L, RPH-CPP  chlorthalidone (HYGROTON) 25 MG tablet Take 0.5 tablets (12.5 mg total) by mouth daily. 09/09/20   Satira Sark, MD  Continuous Blood Gluc Sensor (DEXCOM G6 SENSOR) MISC Use to test daily 05/08/21     Continuous Blood Gluc Sensor (FREESTYLE LIBRE 14 DAY SENSOR) MISC Use as directed 01/30/21     Continuous Blood Gluc Transmit (DEXCOM G6 TRANSMITTER) MISC Use to test daily 05/08/21     escitalopram (LEXAPRO) 10 MG tablet Take 1 tablet (10 mg total) by mouth daily. 04/21/21   Briscoe Deutscher, DO  ezetimibe (ZETIA) 10 MG tablet Take 1 tablet (10 mg total) by mouth daily. 12/24/20 07/08/21  Satira Sark, MD  insulin degludec (TRESIBA FLEXTOUCH) 100 UNIT/ML FlexTouch Pen INJECT 45 UNITS TWICE DAILY 04/21/21     Insulin Glargine (BASAGLAR KWIKPEN) 100 UNIT/ML Inject 70 units under the skin 2  times a day 01/30/21     insulin lispro (HUMALOG KWIKPEN) 100 UNIT/ML KwikPen INJECT 35 UNITS 4 TIMES DAILY 01/30/21     insulin lispro (HUMALOG) 100 UNIT/ML KwikPen Inject 35 Units subcutaneously 4 times a day 10/15/20     Insulin Pen Needle 31G X 5 MM MISC Use as directed 10/15/20     lisinopril (ZESTRIL) 10 MG tablet TAKE 1 TABLET BY MOUTH EVERY DAY 09/09/20 09/09/21  Satira Sark, MD  metFORMIN (GLUCOPHAGE-XR) 500 MG 24 hr tablet TAKE 2 TABLET BY MOUTH TWICE DAILY WITH A MEAL 11/04/20 11/04/21  Briscoe Deutscher, DO  metoprolol succinate (TOPROL-XL) 100 MG 24 hr tablet Take 1 tablet (100 mg total)  by mouth daily. 09/09/20   Satira Sark, MD  Multiple Vitamins-Minerals (MULTIVITAMIN WITH MINERALS) tablet Take 1 tablet by mouth daily.    [provider]  propranolol (INDERAL) 20 MG tablet Take 1 tablet (20 mg total) by mouth 3 (three) times daily. 04/21/21   Briscoe Deutscher, DO  rosuvastatin (CRESTOR) 40 MG tablet Take 1 tablet (40 mg total) by mouth daily. 09/09/20   Satira Sark, MD  tirzepatide [facility]) 15 MG/0.5ML Pen Inject 15 mg into the skin once a week. 03/19/21   Briscoe Deutscher, DO  tirzepatide [facility]) 15 MG/0.5ML Pen Inject 15 mg into the skin once a week. 04/21/21   Briscoe Deutscher, DO  Vitamin D, Ergocalciferol, (DRISDOL) 1.25 MG (  UNIT) CAPS capsule Take 1 capsule by mouth 2 times a week on Sunday & Wednesday 03/27/21   Wallace, Erica, DO  ° ° °Physical Exam: °BP 113/62    Pulse (!) 134    Temp 97.9 °F (36.6 °C) (Tympanic)    Resp 20    Ht 5' 6" (1.676 m)    Wt 77.1 kg    SpO2 98%    BMI 27.44 kg/m²  ° °General: 55 y.o. year-old male ill appearing but in no acute distress.  Alert and oriented x3. °HEENT: Dry mucous membrane.  NCAT, EOMI °Neck: Supple, trachea medial °Cardiovascular: Tachycardic.  Regular rate and rhythm with no rubs or gallops.  No thyromegaly or JVD noted.  No lower extremity edema. 2/4 pulses in all 4 extremities. °Respiratory: Tachypneic.  Clear to auscultation with no wheezes or rales. Good inspiratory effort. °Abdomen: Soft, nontender nondistended with normal bowel sounds x4 quadrants. °Muskuloskeletal: No cyanosis, clubbing or edema noted bilaterally °Neuro: CN II-XII intact, strength 5/5 x 4, sensation, reflexes intact °Skin: No ulcerative lesions noted or rashes °Psychiatry: Judgement and insight appear normal. Mood is appropriate for condition and setting °   °   °   °Labs on Admission:  °Basic Metabolic Panel: °Recent Labs  °Lab 05/27/21 °2210 05/27/21 °2335 05/28/21 °0421  °NA 135  --  139  °K 5.2*  --  4.8  °CL 98  --  104  °CO2 <7*  --   <7*  °GLUCOSE 757*  --  605*  °BUN 33*  --  39*  °CREATININE 1.76*  --  2.17*  °CALCIUM 9.5  --  9.3  °MG  --  2.5*  --   °PHOS  --  8.1*  --   ° °Liver Function Tests: °Recent Labs  °Lab 05/27/21 °2335  °AST 15  °ALT 22  °ALKPHOS 93  °BILITOT 2.3*  °PROT 7.7  °ALBUMIN 3.9  ° °Recent Labs  °Lab 05/27/21 °2335  °LIPASE 24  ° °No results for input(s): AMMONIA in the last 168 hours. °CBC: °Recent Labs  °Lab 05/27/21 °2210  °WBC 19.7*  °HGB 15.5  °HCT 49.4  °MCV 91.0  °PLT 436*  ° °Cardiac Enzymes: °No results for input(s): CKTOTAL, CKMB, CKMBINDEX, TROPONINI in the last 168 hours. ° °BNP (last 3 results) °No results for input(s): BNP in the last 8760 hours. ° °ProBNP (last 3 results) °No results for input(s): PROBNP in the last 8760 hours. ° °CBG: °Recent Labs  °Lab 05/28/21 °0347 05/28/21 °0427 05/28/21 °0457 05/28/21 °0525 05/28/21 °0559  °GLUCAP 587* 555* 496* 481* 347*  ° ° °Radiological Exams on Admission: °DG Chest Port 1 View ° °Result Date: 05/27/2021 °CLINICAL DATA:  Tachycardic, diaphoretic, and feeling faint in triage. Pt also has hx of CAD, HTN. EXAM: PORTABLE CHEST 1 VIEW COMPARISON:  Chest x-ray 01/08/2021, CT chest 12/30/2006 FINDINGS: The heart and mediastinal contours are unchanged. Aortic calcifications. No focal consolidation. No pulmonary edema. No pleural effusion. No pneumothorax. No acute osseous abnormality. IMPRESSION: No active disease. Electronically Signed   By: Morgane  Naveau M.D.   On: 05/27/2021 23:43   ° °EKG: I independently viewed the EKG done and my findings are as followed: Sinus tachycardia at a rate of 123 bpm with incomplete RBBB and LAFB and QTc (483 ms) ° °Assessment/Plan °Present on Admission: ° DKA (diabetic ketoacidosis) (HCC) ° Leukocytosis ° Hyperkalemia ° AKI (acute kidney injury) (HCC) ° High anion gap metabolic acidosis ° Dehydration ° °Principal Problem: °  DKA (diabetic ketoacidosis) (HCC) °  [place]) Active Problems:   AKI (acute kidney injury) ([place])   High anion gap metabolic  acidosis   Leukocytosis   Dehydration   Hyperkalemia   Hyperglycemia due to diabetes mellitus (HCC)   Intractable nausea and vomiting   Thrombocytosis  High anion gap metabolic acidosis secondary to DKA Hyperglycemia secondary to poorly controlled diabetes mellitus Bicarb < 7, anion gap 32, beta hydroxybutyric acid > 8.0 Continue insulin drip, IV LR with IV potassium per DKA protocol Transition IV NS to D5 LR when serum glucose reaches 215m/dL Continue serial BMP and VBG Continue to monitor for anion gap closure prior to transitioning patient to subcu insulin Continue NPO  Intractable nausea and vomiting Continue Compazine as needed  Prolonged QTc (4862m Avoid QT prolonging drugs Magnesium level was 2.5, continue to monitor Continue telemetry  Leukocytosis/thrombocytosis possibly due to reactive process WBC 19.7, platelets 436, continue to monitor with morning labs  Hyperkalemia K+ 5.2, continue insulin and continue to monitor K+  Hypermagnesemia/hyperphosphatemia Mg 2.5, Phos 8.1, continue to monitor  Acute kidney injury/dehydration BUN/creatinine 23/1.76 (baseline creatinine at 0.8-1.2) Continue IV hydration Renally adjust medications, avoid nephrotoxic agents/dehydration/hypotension  HIV, hyperlipidemia, essential hypertension, CAD Patient is currently n.p.o. Resume home meds when patient resumes oral intake BP is currently well controlled  DVT prophylaxis: Lovenox  Code Status: Full code  Family Communication: None at bedside  Disposition Plan:  Patient is from:                        home Anticipated DC to:                   SNF or family members home Anticipated DC date:               2-3 days Anticipated DC barriers:          Patient requires inpatient management due to her DKA requiring IV insulin drip  Consults called: None  Admission status: Observation    OlBernadette HoitD [facility]  05/28/2021, 7:45 AM

## 2021-05-28 NOTE — Progress Notes (Addendum)
Inpatient Diabetes Program Recommendations  AACE/ADA: New Consensus Statement on Inpatient Glycemic Control   Target Ranges:  Prepandial:   less than 140 mg/dL      Peak postprandial:   less than 180 mg/dL (1-2 hours)      Critically ill patients:  140 - 180 mg/dL     Latest Reference Range & Units 05/28/21 03:13 05/28/21 03:47 05/28/21 04:27 05/28/21 04:57 05/28/21 05:25 05/28/21 05:59 05/28/21 07:44 05/28/21 08:34  Glucose-Capillary 70 - 99 mg/dL >600 (HH) 587 (HH) 555 (HH) 496 (H) 481 (H) 347 (H) 327 (H) 282 (H)    Latest Reference Range & Units 05/27/21 22:10  CO2 22 - 32 mmol/L <7 (L)  Glucose 70 - 99 mg/dL 757 (HH)  BUN 6 - 20 mg/dL 33 (H)  Creatinine 0.61 - 1.24 mg/dL 1.76 (H)  Calcium 8.9 - 10.3 mg/dL 9.5  Anion gap 5 - 15  32.0 (H)   Review of Glycemic Control  Diabetes history: DM2 Outpatient Diabetes medications: Basaglar 70 units BID, Humalog 35 units QID, Metformin 500 mg BID, Mounjaro 15 mg Qweek Current orders for Inpatient glycemic control: IV insulin  Inpatient Diabetes Program Recommendations:    Insulin: IV insulin should be continued until acidosis has resolved completely.    NOTE: Noted consult for diabetes coordinator. Chart reviewed.  Diabetes Coordinator working from [employer] today. Per H&P, patient reported "2-day onset of recurrent nonbloody vomiting, patient states that he has not been able to hold anything down, since he vomits any food or fluid ingested.  He has not been taking his insulin due to not being able to tolerate any oral intake."  Attempted to call patient over the phone but no answer. Will attempt to call back today.   Addendum 05/28/21@13 :15-Called patient's room phone and his mother answered. She states that patient is sleeping and has been given medications that are making him sleep. She asked that I try to call patient at a later time since he has not gotten any sleep over the past couple of days due to sickness.  Thanks, Barnie Alderman,  RN, MSN, CDE Diabetes Coordinator Inpatient Diabetes Program [phone] (Team Pager from 8am to 5pm)

## 2021-05-29 LAB — COMPREHENSIVE METABOLIC PANEL
ALT: 16 U/L (ref 0–44)
AST: 15 U/L (ref 15–41)
Albumin: 3.2 g/dL — ABNORMAL LOW (ref 3.5–5.0)
Alkaline Phosphatase: 65 U/L (ref 38–126)
Anion gap: 9 (ref 5–15)
BUN: 35 mg/dL — ABNORMAL HIGH (ref 6–20)
CO2: 25 mmol/L (ref 22–32)
Calcium: 9.1 mg/dL (ref 8.9–10.3)
Chloride: 113 mmol/L — ABNORMAL HIGH (ref 98–111)
Creatinine, Ser: 1.18 mg/dL (ref 0.61–1.24)
GFR, Estimated: >60 mL/min (ref >60)
Glucose, Bld: 188 mg/dL — ABNORMAL HIGH (ref 70–99)
Potassium: 3.1 mmol/L — ABNORMAL LOW (ref 3.5–5.1)
Sodium: 147 mmol/L — ABNORMAL HIGH (ref 135–145)
Total Bilirubin: 0.6 mg/dL (ref 0.3–1.2)
Total Protein: 6.1 g/dL — ABNORMAL LOW (ref 6.5–8.1)

## 2021-05-29 LAB — GLUCOSE, CAPILLARY
Glucose-Capillary: 152 mg/dL — ABNORMAL HIGH (ref 70–99)
Glucose-Capillary: 163 mg/dL — ABNORMAL HIGH (ref 70–99)
Glucose-Capillary: 174 mg/dL — ABNORMAL HIGH (ref 70–99)
Glucose-Capillary: 174 mg/dL — ABNORMAL HIGH (ref 70–99)
Glucose-Capillary: 176 mg/dL — ABNORMAL HIGH (ref 70–99)
Glucose-Capillary: 178 mg/dL — ABNORMAL HIGH (ref 70–99)
Glucose-Capillary: 183 mg/dL — ABNORMAL HIGH (ref 70–99)
Glucose-Capillary: 190 mg/dL — ABNORMAL HIGH (ref 70–99)
Glucose-Capillary: 191 mg/dL — ABNORMAL HIGH (ref 70–99)
Glucose-Capillary: 218 mg/dL — ABNORMAL HIGH (ref 70–99)
Glucose-Capillary: 85 mg/dL (ref 70–99)

## 2021-05-29 LAB — PHOSPHORUS: Phosphorus: 1.8 mg/dL — ABNORMAL LOW (ref 2.5–4.6)

## 2021-05-29 LAB — CBC
HCT: 40.9 % (ref 39.0–52.0)
Hemoglobin: 13.5 g/dL (ref 13.0–17.0)
MCH: 27.8 pg (ref 26.0–34.0)
MCHC: 33 g/dL (ref 30.0–36.0)
MCV: 84.3 fL (ref 80.0–100.0)
Platelets: 315 10*3/uL (ref 150–400)
RBC: 4.85 MIL/uL (ref 4.22–5.81)
RDW: 13.5 % (ref 11.5–15.5)
WBC: 14.2 10*3/uL — ABNORMAL HIGH (ref 4.0–10.5)
nRBC: 0 % (ref 0.0–0.2)

## 2021-05-29 LAB — BETA-HYDROXYBUTYRIC ACID: Beta-Hydroxybutyric Acid: 0.45 mmol/L — ABNORMAL HIGH (ref 0.05–0.27)

## 2021-05-29 LAB — MAGNESIUM: Magnesium: 2.1 mg/dL (ref 1.7–2.4)

## 2021-05-29 MED ORDER — PROPRANOLOL HCL 20 MG PO TABS
20.0000 mg | ORAL_TABLET | Freq: Three times a day (TID) | ORAL | Status: DC
  Administered 2021-05-29 – 2021-05-30 (×5): 20 mg via ORAL
  Filled 2021-05-29 (×5): qty 1

## 2021-05-29 MED ORDER — INSULIN GLARGINE-YFGN 100 UNIT/ML ~~LOC~~ SOLN
70.0000 [IU] | Freq: Two times a day (BID) | SUBCUTANEOUS | Status: DC
  Administered 2021-05-29: 70 [IU] via SUBCUTANEOUS
  Filled 2021-05-29 (×8): qty 0.7

## 2021-05-29 MED ORDER — INSULIN ASPART 100 UNIT/ML IJ SOLN: 0.0000 [IU] | Freq: Every day | INTRAMUSCULAR | Status: DC

## 2021-05-29 MED ORDER — INSULIN ASPART 100 UNIT/ML IJ SOLN
0.0000 [IU] | Freq: Three times a day (TID) | INTRAMUSCULAR | Status: DC
  Administered 2021-05-29 (×3): 3 [IU] via SUBCUTANEOUS

## 2021-05-29 MED ORDER — ESCITALOPRAM OXALATE 10 MG PO TABS
10.0000 mg | ORAL_TABLET | Freq: Every day | ORAL | Status: DC
  Administered 2021-05-29 – 2021-05-30 (×2): 10 mg via ORAL
  Filled 2021-05-29 (×2): qty 1

## 2021-05-29 MED ORDER — ASPIRIN EC 81 MG PO TBEC
81.0000 mg | DELAYED_RELEASE_TABLET | Freq: Every day | ORAL | Status: DC
  Administered 2021-05-30: 81 mg via ORAL
  Filled 2021-05-29: qty 1

## 2021-05-29 MED ORDER — INSULIN GLARGINE-YFGN 100 UNIT/ML ~~LOC~~ SOLN
40.0000 [IU] | Freq: Every day | SUBCUTANEOUS | Status: DC
  Administered 2021-05-29: 40 [IU] via SUBCUTANEOUS
  Filled 2021-05-29 (×2): qty 0.4

## 2021-05-29 MED ORDER — POTASSIUM CHLORIDE 20 MEQ PO PACK
40.0000 meq | PACK | Freq: Once | ORAL | Status: AC
  Administered 2021-05-29: 40 meq via ORAL
  Filled 2021-05-29: qty 2

## 2021-05-29 MED ORDER — K PHOS MONO-SOD PHOS DI & MONO 155-852-130 MG PO TABS
500.0000 mg | ORAL_TABLET | Freq: Four times a day (QID) | ORAL | Status: AC
  Administered 2021-05-29 (×4): 500 mg via ORAL
  Filled 2021-05-29 (×4): qty 2

## 2021-05-29 MED ORDER — ROSUVASTATIN CALCIUM 20 MG PO TABS
40.0000 mg | ORAL_TABLET | Freq: Every day | ORAL | Status: DC
Start: 2021-05-29 — End: 2021-05-30
  Administered 2021-05-29 – 2021-05-30 (×2): 40 mg via ORAL
  Filled 2021-05-29 (×2): qty 2

## 2021-05-29 MED ORDER — SODIUM CHLORIDE 0.45 % IV SOLN: INTRAVENOUS | Status: DC

## 2021-05-29 NOTE — Progress Notes (Addendum)
Inpatient Diabetes Program Recommendations  AACE/ADA: New Consensus Statement on Inpatient Glycemic Control   Target Ranges:  Prepandial:   less than 140 mg/dL      Peak postprandial:   less than 180 mg/dL (1-2 hours)      Critically ill patients:  140 - 180 mg/dL    Latest Reference Range & Units 05/29/21 01:58 05/29/21 02:52 05/29/21 03:46 05/29/21 04:44 05/29/21 05:41 05/29/21 07:29 05/29/21 11:04 05/29/21 13:24  Glucose-Capillary 70 - 99 mg/dL 218 (H) 191 (H) 178 (H) 163 (H) 174 (H) 174 (H) 176 (H) 85    Latest Reference Range & Units 05/27/21 22:10  Glucose 70 - 99 mg/dL 757 (HH)   Review of Glycemic Control  Diabetes history: DM2 Outpatient Diabetes medications: Basaglar 70 units BID, Humalog 35 units QID, Metformin 500 mg BID, Mounjaro 15 mg Qweek Current orders for Inpatient glycemic control: Semglee 40 units daily, Novolog 0-15 units TID with meals, Novolog 0-5 units QHS  Inpatient Diabetes Program Recommendations:    Insulin: Patient was transitioned from IV to SQ insulin this morning; received Semglee 40 units at 5:27 am today.  NOTE: Spoke with patient about diabetes and home regimen for diabetes control. Patient reports being followed by his PCP for diabetes management. Patient states he has seen several Endocrinologist in the past but he does not get along with them very well.  Patient states that he has all DM medications at home but over the past few days that he has not taken his basal insulin and had been trying to use Humalog some. Patient reports that his glucose use to be better controlled when he was using the FreeStyle Libre2 CGM. He states that his insurance made him change to [facility] and he has not had a good experience with the Dexcom (kept falling off, not picking up on his phone app) and he has not used it over the past few days.  Patient would like to get back on FreeStyle Libre2 and his doctor is working with his insurance to try to get approval. Dr. Mal Misty  provided order for diabetes coordinator to provide samples of FreeStyle Libre2 CGM sensors.  Discussed importance of checking CBGs and maintaining good CBG control to prevent long-term and short-term complications. Encouraged patient talk with his PCP about taking insulin when sick and not able to eat. Explained that he likely needs to be taking basal insulin consistently even when not able to eat or keep anything down but he may need to adjust dose.  Patient states that he has everything he needs at home for DM management. Patient verbalized understanding of information discussed and reports no further questions at this time related to diabetes.  Addendum 05/29/21@15 :10-Provided patient to samples of FreeStyle Libre2 sensors. Patient has used them in the past and very familiar with how to use it.  Patient appreciative of samples and will wait and apply once he is discharged home.  Thanks, Barnie Alderman, RN, MSN, CDE Diabetes Coordinator Inpatient Diabetes Program [phone] (Team Pager)

## 2021-05-29 NOTE — Progress Notes (Signed)
°  Transition of Care (TOC) Screening Note   Patient Details  Name: [name] Date of Birth: March 05, 1966   Transition of Care [facility]) CM/SW Contact:    Shade Flood, LCSW Phone Number: 05/29/2021, 11:48 AM    Transition of Care Department [facility]) has reviewed patient and no TOC needs have been identified at this time. We will continue to monitor patient advancement through interdisciplinary progression rounds. If new patient transition needs arise, please place a TOC consult.

## 2021-05-29 NOTE — Progress Notes (Signed)
Progress Note    [name]  LOV:[id number] DOB: Apr 17, 1966  DOA: 05/27/2021 PCP: Asencion Noble, MD      Brief Narrative:    Medical records reviewed and are as summarized below:  [name] is a 56 y.o. male with past medical history significant for insulin-dependent type II DM, HIV, hyperlipidemia, CAD s/p drug-eluting stent, who presented to the hospital with poor oral intake, nausea and vomiting for about 2 days duration.  He was found to have DKA    Assessment/Plan:   Principal Problem:   DKA (diabetic ketoacidosis) ([place]) Active Problems:   AKI (acute kidney injury) ([place])   High anion gap metabolic acidosis   Leukocytosis   Dehydration   Hyperkalemia   Hyperglycemia due to diabetes mellitus ([facility])   Intractable nausea and vomiting   Thrombocytosis    Body mass index is 27.65 kg/m.  Type II DM, DKA: DKA has resolved.  He has been transitioned to insulin glargine.  Basaglar 70 units twice daily.  He only got insulin glargine 40 units this morning.  He is still nauseous and he does not think he will be able to eat much today.  Use NovoLog as needed for hyperglycemia.  AKI: Improved  Hypernatremia: Treat with IV half-normal saline.  Hypokalemia and hypophosphatemia: Replete potassium and phosphorus respectively.  Monitor levels.  Leukocytosis: This was likely reactive and it is improving.  No evidence of infection thus far.  Hypertension, sinus tachycardia: Resume propranolol.  Lisinopril and HCTZ on hold.  CAD s/p coronary stent: Continue aspirin and rosuvastatin   Diet Order             Diet Carb Modified Fluid consistency: Thin; Room service appropriate? Yes  Diet effective now                      Consultants: None  Procedures: None    Medications:    [START ON 05/30/2021] aspirin EC  81 mg Oral Q breakfast   Chlorhexidine Gluconate Cloth  6 each Topical Daily   enoxaparin (LOVENOX) injection  40 mg Subcutaneous Q24H    escitalopram  10 mg Oral Daily   insulin aspart  0-15 Units Subcutaneous TID WC   insulin aspart  0-5 Units Subcutaneous QHS   insulin glargine-yfgn  70 Units Subcutaneous BID   phosphorus  500 mg Oral QID   propranolol  20 mg Oral TID   rosuvastatin  40 mg Oral Daily   Continuous Infusions:  sodium chloride       Anti-infectives (From admission, onward)    None              Family Communication/Anticipated D/C date and plan/Code Status   DVT prophylaxis: SCDs Start: 05/28/21 1228 enoxaparin (LOVENOX) injection 40 mg Start: 05/28/21 1000     Code Status: Full Code  Family Communication: None Disposition Plan: Plan to discharge home tomorrow   Status is: Inpatient  Remains inpatient appropriate because: IV fluids, nausea           Subjective:   Interval events noted.  He complains of nausea.  No vomiting, diarrhea or abdominal pain  Objective:    Vitals:   05/29/21 1000 05/29/21 1100 05/29/21 1113 05/29/21 1153  BP: (!) 160/72 (!) 174/76 (!) 174/76 (!) 166/86  Pulse: (!) 115 (!) 111 (!) 114 (!) 106  Resp: (!) 22 (!) 31 20 19   Temp:  98.1 F (36.7 C)  98 F (36.7 C)  TempSrc:  Oral  Oral  SpO2: 95% 98% 96% 99%  Weight:      Height:       No data found.   Intake/Output Summary (Last 24 hours) at 05/29/2021 1211 Last data filed at 05/29/2021 3013 Gross per 24 hour  Intake 2624.38 ml  Output 450 ml  Net 2174.38 ml   Filed Weights   05/27/21 2153 05/27/21 2157 05/28/21 0804  Weight: 77.1 kg 77.1 kg 77.7 kg    Exam:  GEN: NAD SKIN: No rash EYES: EOMI ENT: MMM CV: RRR PULM: CTA B ABD: soft, ND, NT, +BS CNS: AAO x 3, non focal EXT: No edema or tenderness        Data Reviewed:   I have personally reviewed following labs and imaging studies:  Labs: Labs show the following:   Basic Metabolic Panel: Recent Labs  Lab 05/27/21 2335 05/28/21 0421 05/28/21 0836 05/28/21 1052 05/28/21 1514 05/28/21 1918 05/29/21 0340   NA  --    < > 143 145 145 145 147*  K  --    < > 3.9 4.3 3.6 3.4* 3.1*  CL  --    < > 108 109 110 112* 113*  CO2  --    < > 12* 18* 16* 22 25  GLUCOSE  --    < > 303* 172* 274* 165* 188*  BUN  --    < > 40* 41* 41* 42* 35*  CREATININE  --    < > 2.03* 1.90* 1.62* 1.45* 1.18  CALCIUM  --    < > 9.4 9.2 9.1 8.9 9.1  MG 2.5*  --   --   --   --   --  2.1  PHOS 8.1*  --   --   --   --   --  1.8*   < > = values in this interval not displayed.   GFR Estimated Creatinine Clearance: 69.4 mL/min (by C-G formula based on SCr of 1.18 mg/dL). Liver Function Tests: Recent Labs  Lab 05/27/21 2335 05/29/21 0340  AST 15 15  ALT 22 16  ALKPHOS 93 65  BILITOT 2.3* 0.6  PROT 7.7 6.1*  ALBUMIN 3.9 3.2*   Recent Labs  Lab 05/27/21 2335  LIPASE 24   No results for input(s): AMMONIA in the last 168 hours. Coagulation profile No results for input(s): INR, PROTIME in the last 168 hours.  CBC: Recent Labs  Lab 05/27/21 2210 05/28/21 0421 05/29/21 0340  WBC 19.7* 24.8* 14.2*  HGB 15.5 15.0 13.5  HCT 49.4 48.5 40.9  MCV 91.0 93.3 84.3  PLT 436* 391 315   Cardiac Enzymes: No results for input(s): CKTOTAL, CKMB, CKMBINDEX, TROPONINI in the last 168 hours. BNP (last 3 results) No results for input(s): PROBNP in the last 8760 hours. CBG: Recent Labs  Lab 05/29/21 0346 05/29/21 0444 05/29/21 0541 05/29/21 0729 05/29/21 1104  GLUCAP 178* 163* 174* 174* 176*   D-Dimer: No results for input(s): DDIMER in the last 72 hours. Hgb A1c: No results for input(s): HGBA1C in the last 72 hours. Lipid Profile: No results for input(s): CHOL, HDL, LDLCALC, TRIG, CHOLHDL, LDLDIRECT in the last 72 hours. Thyroid function studies: No results for input(s): TSH, T4TOTAL, T3FREE, THYROIDAB in the last 72 hours.  Invalid input(s): FREET3 Anemia work up: No results for input(s): VITAMINB12, FOLATE, FERRITIN, TIBC, IRON, RETICCTPCT in the last 72 hours. Sepsis Labs: Recent Labs  Lab 05/27/21 2210  05/28/21 0421 05/29/21 0340  WBC 19.7*  24.8* 14.2*    Microbiology Recent Results (from the past 240 hour(s))  Resp Panel by RT-PCR (Flu A&B, Covid) Nasopharyngeal Swab     Status: None   Collection Time: 05/27/21 11:25 PM   Specimen: Nasopharyngeal Swab; Nasopharyngeal(NP) swabs in vial transport medium  Result Value Ref Range Status   SARS Coronavirus 2 by RT PCR NEGATIVE NEGATIVE Final    Comment: (NOTE) SARS-CoV-2 target nucleic acids are NOT DETECTED.  The SARS-CoV-2 RNA is generally detectable in upper respiratory specimens during the acute phase of infection. The lowest concentration of SARS-CoV-2 viral copies this assay can detect is 138 copies/mL. A negative result does not preclude SARS-Cov-2 infection and should not be used as the sole basis for treatment or other patient management decisions. A negative result may occur with  improper specimen collection/handling, submission of specimen other than nasopharyngeal swab, presence of viral mutation(s) within the areas targeted by this assay, and inadequate number of viral copies(<138 copies/mL). A negative result must be combined with clinical observations, patient history, and epidemiological information. The expected result is Negative.  Fact Sheet for Patients:  EntrepreneurPulse.com.au  Fact Sheet for Healthcare Providers:  IncredibleEmployment.be  This test is no t yet approved or cleared by the [place] FDA and  has been authorized for detection and/or diagnosis of SARS-CoV-2 by FDA under an Emergency Use Authorization (EUA). This EUA will remain  in effect (meaning this test can be used) for the duration of the COVID-19 declaration under Section 564(b)(1) of the Act, 21 U.S.C.section 360bbb-3(b)(1), unless the authorization is terminated  or revoked sooner.       Influenza A by PCR NEGATIVE NEGATIVE Final   Influenza B by PCR NEGATIVE NEGATIVE Final    Comment:  (NOTE) The Xpert Xpress SARS-CoV-2/FLU/RSV plus assay is intended as an aid in the diagnosis of influenza from Nasopharyngeal swab specimens and should not be used as a sole basis for treatment. Nasal washings and aspirates are unacceptable for Xpert Xpress SARS-CoV-2/FLU/RSV testing.  Fact Sheet for Patients: EntrepreneurPulse.com.au  Fact Sheet for Healthcare Providers: IncredibleEmployment.be  This test is not yet approved or cleared by the [place] FDA and has been authorized for detection and/or diagnosis of SARS-CoV-2 by FDA under an Emergency Use Authorization (EUA). This EUA will remain in effect (meaning this test can be used) for the duration of the COVID-19 declaration under Section 564(b)(1) of the Act, 21 U.S.C. section 360bbb-3(b)(1), unless the authorization is terminated or revoked.  Performed at [facility], [address] [place], [place] [address]   MRSA Next Gen by PCR, Nasal     Status: None   Collection Time: 05/28/21  9:39 AM   Specimen: Nasal Mucosa; Nasal Swab  Result Value Ref Range Status   MRSA by PCR Next Gen NOT DETECTED NOT DETECTED Final    Comment: (NOTE) The GeneXpert MRSA Assay (FDA approved for NASAL specimens only), is one component of a comprehensive MRSA colonization surveillance program. It is not intended to diagnose MRSA infection nor to guide or monitor treatment for MRSA infections. Test performance is not FDA approved in patients less than 3 years old. Performed at [facility], [address] [place], [place] [address]     Procedures and diagnostic studies:  DG Chest [facility] 1 View  Result Date: 05/27/2021 CLINICAL DATA:  Tachycardic, diaphoretic, and feeling faint in triage. Pt also has hx of CAD, HTN. EXAM: PORTABLE CHEST 1 VIEW COMPARISON:  Chest x-ray 01/08/2021, CT chest 12/30/2006 FINDINGS: The heart and  mediastinal contours are unchanged. Aortic calcifications. No focal consolidation.  No pulmonary edema. No pleural effusion. No pneumothorax. No acute osseous abnormality. IMPRESSION: No active disease. Electronically Signed   By: Iven Finn M.D.   On: 05/27/2021 23:43               LOS: 1 day   Placida Cambre  [facility]   Pager on [email or web address]. If 7PM-7AM, please contact night-coverage at [email or web address]     05/29/2021, 12:11 PM

## 2021-05-30 ENCOUNTER — Other Ambulatory Visit (HOSPITAL_COMMUNITY): Payer: Self-pay

## 2021-05-30 LAB — GLUCOSE, CAPILLARY
Glucose-Capillary: 106 mg/dL — ABNORMAL HIGH (ref 70–99)
Glucose-Capillary: 57 mg/dL — ABNORMAL LOW (ref 70–99)
Glucose-Capillary: 113 mg/dL — ABNORMAL HIGH (ref 70–99)
Glucose-Capillary: 51 mg/dL — ABNORMAL LOW (ref 70–99)

## 2021-05-30 LAB — CBC WITH DIFFERENTIAL/PLATELET
Abs Immature Granulocytes: 0.01 10*3/uL (ref 0.00–0.07)
Basophils Absolute: 0 10*3/uL (ref 0.0–0.1)
Basophils Relative: 0 %
Eosinophils Absolute: 0 10*3/uL (ref 0.0–0.5)
Eosinophils Relative: 0 %
HCT: 39.4 % (ref 39.0–52.0)
Hemoglobin: 12.9 g/dL — ABNORMAL LOW (ref 13.0–17.0)
Immature Granulocytes: 0 %
Lymphocytes Relative: 43 %
Lymphs Abs: 3 10*3/uL (ref 0.7–4.0)
MCH: 28.5 pg (ref 26.0–34.0)
MCHC: 32.7 g/dL (ref 30.0–36.0)
MCV: 87 fL (ref 80.0–100.0)
Monocytes Absolute: 0.7 10*3/uL (ref 0.1–1.0)
Monocytes Relative: 11 %
Neutro Abs: 3.2 10*3/uL (ref 1.7–7.7)
Neutrophils Relative %: 46 %
Platelets: 246 10*3/uL (ref 150–400)
RBC: 4.53 MIL/uL (ref 4.22–5.81)
RDW: 13.6 % (ref 11.5–15.5)
WBC: 6.9 10*3/uL (ref 4.0–10.5)
nRBC: 0 % (ref 0.0–0.2)

## 2021-05-30 LAB — BASIC METABOLIC PANEL
Anion gap: 9 (ref 5–15)
BUN: 23 mg/dL — ABNORMAL HIGH (ref 6–20)
CO2: 26 mmol/L (ref 22–32)
Calcium: 8.2 mg/dL — ABNORMAL LOW (ref 8.9–10.3)
Chloride: 110 mmol/L (ref 98–111)
Creatinine, Ser: 0.78 mg/dL (ref 0.61–1.24)
GFR, Estimated: >60 mL/min (ref >60)
Glucose, Bld: 65 mg/dL — ABNORMAL LOW (ref 70–99)
Potassium: 2.7 mmol/L — CL (ref 3.5–5.1)
Sodium: 145 mmol/L (ref 135–145)

## 2021-05-30 LAB — POTASSIUM: Potassium: 4 mmol/L (ref 3.5–5.1)

## 2021-05-30 LAB — PHOSPHORUS: Phosphorus: 3.4 mg/dL (ref 2.5–4.6)

## 2021-05-30 LAB — MAGNESIUM: Magnesium: 1.8 mg/dL (ref 1.7–2.4)

## 2021-05-30 MED ORDER — INSULIN GLARGINE-YFGN 100 UNIT/ML ~~LOC~~ SOLN
50.0000 [IU] | Freq: Two times a day (BID) | SUBCUTANEOUS | Status: DC
  Administered 2021-05-30: 50 [IU] via SUBCUTANEOUS
  Filled 2021-05-30 (×7): qty 0.5

## 2021-05-30 MED ORDER — MAGNESIUM SULFATE 2 GM/50ML IV SOLN
2.0000 g | Freq: Once | INTRAVENOUS | Status: AC
  Administered 2021-05-30: 2 g via INTRAVENOUS
  Filled 2021-05-30: qty 50

## 2021-05-30 MED ORDER — BASAGLAR KWIKPEN 100 UNIT/ML ~~LOC~~ SOPN: 45.0000 [IU] | PEN_INJECTOR | Freq: Two times a day (BID) | SUBCUTANEOUS | Status: AC

## 2021-05-30 MED ORDER — POTASSIUM CHLORIDE 20 MEQ PO PACK
40.0000 meq | PACK | ORAL | Status: AC
  Administered 2021-05-30 (×2): 40 meq via ORAL
  Filled 2021-05-30 (×2): qty 2

## 2021-05-30 NOTE — Progress Notes (Signed)
Nsg Discharge Note  Admit Date:  05/27/2021 Discharge date: 05/30/2021   [name] to be D/C'd Home per MD order.  AVS completed.   Patient/caregiver able to verbalize understanding.  Discharge Medication: Allergies as of 05/30/2021       Reactions   Pepcid [famotidine] Other (See Comments)   Contraindicated with ODEFSEY   Prilosec [omeprazole] Other (See Comments)   Contraindicated with RPV (lowers level of this ARV in ODEFSEY   Odefsey [emtricitabine-rilpivirine-tenofovir Af]    Makes blood sugars run high        Medication List     STOP taking these medications    benzonatate 100 MG capsule Commonly known as: TESSALON   metoprolol succinate 100 MG 24 hr tablet Commonly known as: TOPROL-XL   Tresiba FlexTouch 100 UNIT/ML FlexTouch Pen Generic drug: insulin degludec       TAKE these medications    aspirin 81 MG tablet Take 1 tablet (81 mg total) by mouth daily with breakfast.   Basaglar KwikPen 100 UNIT/ML Inject 45 Units into the skin 2 (two) times daily. What changed:  how much to take when to take this   cabotegravir & rilpivirine ER 600 & 900 MG/3ML injection Commonly known as: CABENUVA Inject 1 kit into the muscle every 2 (two) months.   chlorthalidone 25 MG tablet Commonly known as: HYGROTON Take 0.5 tablets (12.5 mg total) by mouth daily.   Dexcom G6 Transmitter Misc Use to test daily   escitalopram 10 MG tablet Commonly known as: Lexapro Take 1 tablet (10 mg total) by mouth daily.   ezetimibe 10 MG tablet Commonly known as: ZETIA Take 1 tablet (10 mg total) by mouth daily.   FreeStyle Libre 14 Day Sensor Misc Use as directed   Dexcom G6 Sensor Misc Use to test daily   HumaLOG KwikPen 100 UNIT/ML KwikPen Generic drug: insulin lispro Inject 35 Units subcutaneously 4 times a day (Inject 35 Units subcutaneously 4 times a day)   insulin lispro 100 UNIT/ML KwikPen Commonly known as: HumaLOG KwikPen INJECT 35 UNITS 4 TIMES DAILY    lisinopril 10 MG tablet Commonly known as: ZESTRIL TAKE 1 TABLET BY MOUTH EVERY DAY   metFORMIN 500 MG 24 hr tablet Commonly known as: GLUCOPHAGE-XR TAKE 2 TABLET BY MOUTH TWICE DAILY WITH A MEAL   multivitamin with minerals tablet Take 1 tablet by mouth daily.   propranolol 20 MG tablet Commonly known as: INDERAL Take 1 tablet (20 mg total) by mouth 3 (three) times daily.   rosuvastatin 40 MG tablet Commonly known as: CRESTOR Take 1 tablet (40 mg total) by mouth daily.   tirzepatide 15 MG/0.5ML Pen Commonly known as: MOUNJARO Inject 15 mg into the skin once a week. What changed: Another medication with the same name was removed. Continue taking this medication, and follow the directions you see here.   Unifine Pentips 31G X 5 MM Misc Generic drug: Insulin Pen Needle Use as directed (Use as directed)   Vitamin D (Ergocalciferol) 1.25 MG (50000 UNIT) Caps capsule Commonly known as: DRISDOL Take 1 capsule by mouth 2 times a week on Sunday & Wednesday        Discharge Assessment: Vitals:   05/30/21 0524 05/30/21 1500  BP: 127/71 109/68  Pulse: 70 75  Resp: 18 20  Temp: 98 F (36.7 C) 98.2 F (36.8 C)  SpO2: 93% 100%   Skin clean, dry and intact without evidence of skin break down, no evidence of skin tears noted. IV catheter  discontinued intact. Site without signs and symptoms of complications - no redness or edema noted at insertion site, patient denies c/o pain - only slight tenderness at site.  Dressing with slight pressure applied.  D/c Instructions-Education: Discharge instructions given to patient/family with verbalized understanding. D/c education completed with patient/family including follow up instructions, medication list, d/c activities limitations if indicated, with other d/c instructions as indicated by MD - patient able to verbalize understanding, all questions fully answered. Patient instructed to return to ED, call 911, or call MD for any changes  in condition.  Patient escorted via [place], and D/C home via private auto.  Kathie Rhodes, RN 05/30/2021 4:56 PM

## 2021-05-30 NOTE — Discharge Summary (Signed)
Physician Discharge Summary  [name] DGU:[id number] DOB: 1965/09/15 DOA: 05/27/2021  PCP: Omar Noble, MD  Admit date: 05/27/2021 Discharge date: 05/30/2021  Discharge disposition: Home   Recommendations for Outpatient Follow-Up:   Follow-up with PCP in 1 week    Discharge Diagnosis:   Principal Problem:   DKA (diabetic ketoacidosis) ([place]) Active Problems:   AKI (acute kidney injury) ([place])   High anion gap metabolic acidosis   Leukocytosis   Dehydration   Hyperkalemia   Hyperglycemia due to diabetes mellitus (HCC)   Intractable nausea and vomiting   Thrombocytosis    Discharge Condition: Stable.  Diet recommendation:  Diet Order             Diet - low sodium heart healthy           Diet Carb Modified           Diet Carb Modified Fluid consistency: Thin; Room service appropriate? Yes  Diet effective now                     Code Status: Full Code     Hospital Course:   Mr. [name] is a 56 y.o. male with past medical history significant for insulin-dependent type II DM, HIV, hyperlipidemia, CAD s/p drug-eluting stent, who presented to the hospital with poor oral intake, nausea and vomiting for about 2 days duration.   He was found to have DKA and AKI.  He was treated with IV insulin infusion and IV fluids per DKA protocol.  He also had hypokalemia, hypophosphatemia and hyponatremia that were corrected successfully.  He had episodes of hypoglycemia during this hospitalization. He said he has been eating less and less since he had dental surgery in December 2022. For this reason, [name] has been decreased from 70 units twice daily to 45 units twice daily with instructions to titrate dose upward if he becomes persistently hyperglycemic.  He had 2 long-acting insulins on this medication reconciliation record, Basaglar and [place].  He said he does not take [place] but takes [name].  His condition has improved and he is deemed stable for discharge to  home today.  Close follow-up with PCP for titration of insulin was recommended.      Discharge Exam:    Vitals:   05/29/21 1538 05/29/21 2106 05/30/21 0524 05/30/21 1500  BP: 126/64 137/72 127/71 109/68  Pulse: 81 82 70 75  Resp: 20 18 18 20   Temp: 99 F (37.2 C) 97.7 F (36.5 C) 98 F (36.7 C) 98.2 F (36.8 C)  TempSrc: Oral   Oral  SpO2: 97% 94% 93% 100%  Weight:      Height:         GEN: NAD SKIN: Warm and dry EYES: No pallor or icterus ENT: MMM, he has some missing upper and lower teeth but no evidence of dental infection was noted CV: RRR PULM: CTA B ABD: soft, obese, NT, +BS CNS: AAO x 3, non focal EXT: No edema or tenderness   The results of significant diagnostics from this hospitalization (including imaging, microbiology, ancillary and laboratory) are listed below for reference.     Procedures and Diagnostic Studies:   DG Chest Port 1 View  Result Date: 05/27/2021 CLINICAL DATA:  Tachycardic, diaphoretic, and feeling faint in triage. Pt also has hx of CAD, HTN. EXAM: PORTABLE CHEST 1 VIEW COMPARISON:  Chest x-ray 01/08/2021, CT chest 12/30/2006 FINDINGS: The heart and mediastinal contours are unchanged. Aortic calcifications. No focal  consolidation. No pulmonary edema. No pleural effusion. No pneumothorax. No acute osseous abnormality. IMPRESSION: No active disease. Electronically Signed   By: [name] M.D.   On: 05/27/2021 23:43     Labs:   Basic Metabolic Panel: Recent Labs  Lab 05/27/21 2335 05/28/21 0421 05/28/21 1052 05/28/21 1514 05/28/21 1918 05/29/21 0340 05/30/21 0657 05/30/21 1500  NA  --    < > 145 145 145 147* 145  --   K  --    < > 4.3 3.6 3.4* 3.1* 2.7* 4.0  CL  --    < > 109 110 112* 113* 110  --   CO2  --    < > 18* 16* 22 25 26   --   GLUCOSE  --    < > 172* 274* 165* 188* 65*  --   BUN  --    < > 41* 41* 42* 35* 23*  --   CREATININE  --    < > 1.90* 1.62* 1.45* 1.18 0.78  --   CALCIUM  --    < > 9.2 9.1 8.9 9.1 8.2*   --   MG 2.5*  --   --   --   --  2.1 1.8  --   PHOS 8.1*  --   --   --   --  1.8* 3.4  --    < > = values in this interval not displayed.   GFR Estimated Creatinine Clearance: 102.4 mL/min (by C-G formula based on SCr of 0.78 mg/dL). Liver Function Tests: Recent Labs  Lab 05/27/21 2335 05/29/21 0340  AST 15 15  ALT 22 16  ALKPHOS 93 65  BILITOT 2.3* 0.6  PROT 7.7 6.1*  ALBUMIN 3.9 3.2*   Recent Labs  Lab 05/27/21 2335  LIPASE 24   No results for input(s): AMMONIA in the last 168 hours. Coagulation profile No results for input(s): INR, PROTIME in the last 168 hours.  CBC: Recent Labs  Lab 05/27/21 2210 05/28/21 0421 05/29/21 0340 05/30/21 0657  WBC 19.7* 24.8* 14.2* 6.9  NEUTROABS  --   --   --  3.2  HGB 15.5 15.0 13.5 12.9*  HCT 49.4 48.5 40.9 39.4  MCV 91.0 93.3 84.3 87.0  PLT 436* 391 315 246   Cardiac Enzymes: No results for input(s): CKTOTAL, CKMB, CKMBINDEX, TROPONINI in the last 168 hours. BNP: Invalid input(s): POCBNP CBG: Recent Labs  Lab 05/29/21 1636 05/29/21 2112 05/30/21 0744 05/30/21 0827 05/30/21 1119  GLUCAP 190* 152* 57* 106* 113*   D-Dimer No results for input(s): DDIMER in the last 72 hours. Hgb A1c No results for input(s): HGBA1C in the last 72 hours. Lipid Profile No results for input(s): CHOL, HDL, LDLCALC, TRIG, CHOLHDL, LDLDIRECT in the last 72 hours. Thyroid function studies No results for input(s): TSH, T4TOTAL, T3FREE, THYROIDAB in the last 72 hours.  Invalid input(s): FREET3 Anemia work up No results for input(s): VITAMINB12, FOLATE, FERRITIN, TIBC, IRON, RETICCTPCT in the last 72 hours. Microbiology Recent Results (from the past 240 hour(s))  Resp Panel by RT-PCR (Flu A&B, Covid) Nasopharyngeal Swab     Status: None   Collection Time: 05/27/21 11:25 PM   Specimen: Nasopharyngeal Swab; Nasopharyngeal(NP) swabs in vial transport medium  Result Value Ref Range Status   SARS Coronavirus 2 by RT PCR NEGATIVE NEGATIVE  Final    Comment: (NOTE) SARS-CoV-2 target nucleic acids are NOT DETECTED.  The SARS-CoV-2 RNA is generally detectable in upper respiratory specimens during the acute  phase of infection. The lowest concentration of SARS-CoV-2 viral copies this assay can detect is 138 copies/mL. A negative result does not preclude SARS-Cov-2 infection and should not be used as the sole basis for treatment or other patient management decisions. A negative result may occur with  improper specimen collection/handling, submission of specimen other than nasopharyngeal swab, presence of viral mutation(s) within the areas targeted by this assay, and inadequate number of viral copies(<138 copies/mL). A negative result must be combined with clinical observations, patient history, and epidemiological information. The expected result is Negative.  Fact Sheet for Patients:  EntrepreneurPulse.com.au  Fact Sheet for Healthcare Providers:  IncredibleEmployment.be  This test is no t yet approved or cleared by the [place] FDA and  has been authorized for detection and/or diagnosis of SARS-CoV-2 by FDA under an Emergency Use Authorization (EUA). This EUA will remain  in effect (meaning this test can be used) for the duration of the COVID-19 declaration under Section 564(b)(1) of the Act, 21 U.S.C.section 360bbb-3(b)(1), unless the authorization is terminated  or revoked sooner.       Influenza A by PCR NEGATIVE NEGATIVE Final   Influenza B by PCR NEGATIVE NEGATIVE Final    Comment: (NOTE) The Xpert Xpress SARS-CoV-2/FLU/RSV plus assay is intended as an aid in the diagnosis of influenza from Nasopharyngeal swab specimens and should not be used as a sole basis for treatment. Nasal washings and aspirates are unacceptable for Xpert Xpress SARS-CoV-2/FLU/RSV testing.  Fact Sheet for Patients: EntrepreneurPulse.com.au  Fact Sheet for Healthcare  Providers: IncredibleEmployment.be  This test is not yet approved or cleared by the [place] FDA and has been authorized for detection and/or diagnosis of SARS-CoV-2 by FDA under an Emergency Use Authorization (EUA). This EUA will remain in effect (meaning this test can be used) for the duration of the COVID-19 declaration under Section 564(b)(1) of the Act, 21 U.S.C. section 360bbb-3(b)(1), unless the authorization is terminated or revoked.  Performed at [facility], [address], [place], [place] [address]   MRSA Next Gen by PCR, Nasal     Status: None   Collection Time: 05/28/21  9:39 AM   Specimen: Nasal Mucosa; Nasal Swab  Result Value Ref Range Status   MRSA by PCR Next Gen NOT DETECTED NOT DETECTED Final    Comment: (NOTE) The GeneXpert MRSA Assay (FDA approved for NASAL specimens only), is one component of a comprehensive MRSA colonization surveillance program. It is not intended to diagnose MRSA infection nor to guide or monitor treatment for MRSA infections. Test performance is not FDA approved in patients less than 43 years old. Performed at [facility], [address], [place], [place] [address]      Discharge Instructions:   Discharge Instructions     Diet - low sodium heart healthy   Complete by: As directed    Diet Carb Modified   Complete by: As directed    Increase activity slowly   Complete by: As directed       Allergies as of 05/30/2021       Reactions   Pepcid [famotidine] Other (See Comments)   Contraindicated with ODEFSEY   Prilosec [omeprazole] Other (See Comments)   Contraindicated with RPV (lowers level of this ARV in ODEFSEY   Odefsey [emtricitabine-rilpivirine-tenofovir Af]    Makes blood sugars run high        Medication List     STOP taking these medications    benzonatate 100 MG capsule Commonly known as: TESSALON  metoprolol succinate 100 MG 24 hr tablet Commonly known as: TOPROL-XL   Tresiba  FlexTouch 100 UNIT/ML FlexTouch Pen Generic drug: insulin degludec       TAKE these medications    aspirin 81 MG tablet Take 1 tablet (81 mg total) by mouth daily with breakfast.   Basaglar KwikPen 100 UNIT/ML Inject 45 Units into the skin 2 (two) times daily. What changed:  how much to take when to take this   cabotegravir & rilpivirine ER 600 & 900 MG/3ML injection Commonly known as: CABENUVA Inject 1 kit into the muscle every 2 (two) months.   chlorthalidone 25 MG tablet Commonly known as: HYGROTON Take 0.5 tablets (12.5 mg total) by mouth daily.   Dexcom G6 Transmitter Misc Use to test daily   escitalopram 10 MG tablet Commonly known as: Lexapro Take 1 tablet (10 mg total) by mouth daily.   ezetimibe 10 MG tablet Commonly known as: ZETIA Take 1 tablet (10 mg total) by mouth daily.   FreeStyle Libre 14 Day Sensor Misc Use as directed   Dexcom G6 Sensor Misc Use to test daily   HumaLOG KwikPen 100 UNIT/ML KwikPen Generic drug: insulin lispro Inject 35 Units subcutaneously 4 times a day (Inject 35 Units subcutaneously 4 times a day)   insulin lispro 100 UNIT/ML KwikPen Commonly known as: HumaLOG KwikPen INJECT 35 UNITS 4 TIMES DAILY   lisinopril 10 MG tablet Commonly known as: ZESTRIL TAKE 1 TABLET BY MOUTH EVERY DAY   metFORMIN 500 MG 24 hr tablet Commonly known as: GLUCOPHAGE-XR TAKE 2 TABLET BY MOUTH TWICE DAILY WITH A MEAL   multivitamin with minerals tablet Take 1 tablet by mouth daily.   propranolol 20 MG tablet Commonly known as: INDERAL Take 1 tablet (20 mg total) by mouth 3 (three) times daily.   rosuvastatin 40 MG tablet Commonly known as: CRESTOR Take 1 tablet (40 mg total) by mouth daily.   tirzepatide 15 MG/0.5ML Pen Commonly known as: MOUNJARO Inject 15 mg into the skin once a week. What changed: Another medication with the same name was removed. Continue taking this medication, and follow the directions you see here.   Unifine  Pentips 31G X 5 MM Misc Generic drug: Insulin Pen Needle Use as directed (Use as directed)   Vitamin D (Ergocalciferol) 1.25 MG (50000 UNIT) Caps capsule Commonly known as: DRISDOL Take 1 capsule by mouth 2 times a week on Sunday & Wednesday           If you experience worsening of your admission symptoms, develop shortness of breath, life threatening emergency, suicidal or homicidal thoughts you must seek medical attention immediately by calling 911 or calling your MD immediately  if symptoms less severe.   You must read complete instructions/literature along with all the possible adverse reactions/side effects for all the medicines you take and that have been prescribed to you. Take any new medicines after you have completely understood and accept all the possible adverse reactions/side effects.    Please note   You were cared for by a hospitalist during your hospital stay. If you have any questions about your discharge medications or the care you received while you were in the hospital after you are discharged, you can call the unit and asked to speak with the hospitalist on call if the hospitalist that took care of you is not available. Once you are discharged, your primary care physician will handle any further medical issues. Please note that NO REFILLS for any discharge medications  will be authorized once you are discharged, as it is imperative that you return to your primary care physician (or establish a relationship with a primary care physician if you do not have one) for your aftercare needs so that they can reassess your need for medications and monitor your lab values.       Time coordinating discharge: 35 minutes  Signed:  Vora Clover  [facility] 05/30/2021, 4:42 PM   Pager on [email or web address]. If 7PM-7AM, please contact night-coverage at [email or web address]

## 2021-06-02 ENCOUNTER — Other Ambulatory Visit: Payer: Self-pay

## 2021-06-02 ENCOUNTER — Other Ambulatory Visit (HOSPITAL_COMMUNITY): Payer: Self-pay

## 2021-06-02 ENCOUNTER — Telehealth: Payer: Self-pay

## 2021-06-02 ENCOUNTER — Ambulatory Visit (INDEPENDENT_AMBULATORY_CARE_PROVIDER_SITE_OTHER): Payer: 59 | Admitting: Family Medicine

## 2021-06-02 ENCOUNTER — Encounter (INDEPENDENT_AMBULATORY_CARE_PROVIDER_SITE_OTHER): Payer: Self-pay | Admitting: Family Medicine

## 2021-06-02 VITALS — BP 129/72 | HR 94 | Temp 98.0°F | Ht 66.0 in | Wt 174.0 lb

## 2021-06-02 DIAGNOSIS — E559 Vitamin D deficiency, unspecified: Secondary | ICD-10-CM

## 2021-06-02 DIAGNOSIS — S50811A Abrasion of right forearm, initial encounter: Secondary | ICD-10-CM

## 2021-06-02 DIAGNOSIS — E538 Deficiency of other specified B group vitamins: Secondary | ICD-10-CM | POA: Diagnosis not present

## 2021-06-02 DIAGNOSIS — E1169 Type 2 diabetes mellitus with other specified complication: Secondary | ICD-10-CM

## 2021-06-02 DIAGNOSIS — S50811S Abrasion of right forearm, sequela: Secondary | ICD-10-CM

## 2021-06-02 DIAGNOSIS — Z6831 Body mass index (BMI) 31.0-31.9, adult: Secondary | ICD-10-CM

## 2021-06-02 DIAGNOSIS — Z794 Long term (current) use of insulin: Secondary | ICD-10-CM

## 2021-06-02 DIAGNOSIS — Z6828 Body mass index (BMI) 28.0-28.9, adult: Secondary | ICD-10-CM | POA: Diagnosis not present

## 2021-06-02 DIAGNOSIS — E669 Obesity, unspecified: Secondary | ICD-10-CM

## 2021-06-02 MED ORDER — "BD LUER-LOK SYRINGE 25G X 1"" 3 ML MISC"
0 refills | Status: DC
  Filled 2021-06-02: qty 10, 70d supply, fill #0

## 2021-06-02 MED ORDER — CYANOCOBALAMIN 1000 MCG/ML IJ SOLN
INTRAMUSCULAR | 0 refills | Status: DC
  Filled 2021-06-02: qty 4, 28d supply, fill #0

## 2021-06-02 MED ORDER — MUPIROCIN 2 % EX OINT
1.0000 | TOPICAL_OINTMENT | Freq: Two times a day (BID) | CUTANEOUS | 2 refills | Status: DC
Start: 2021-06-02 — End: 2024-04-17
  Filled 2021-06-02: qty 22, 11d supply, fill #0
  Filled 2021-08-14: qty 22, 11d supply, fill #1
  Filled 2021-11-09: qty 22, 11d supply, fill #2

## 2021-06-02 NOTE — Telephone Encounter (Signed)
[facility] Patient Advocate Encounter  Patient's medication [name]) have been couriered to [facility] from [facility] and will be administered on patient next office visit on 06/16/21.  [name] , [facility] Patient [facility] Phone: [phone] Fax:  [phone]

## 2021-06-02 NOTE — Patient Outreach (Addendum)
Received a [facility] discharge notification for [name]. The Primary Care Physician Dr. Willey Blade does the transition of care calls, I have assigned Deloria Lair, NP to outreach and determine if there are any case management needs.  [facility]ville Care, [place], [place] Management [phone]

## 2021-06-03 ENCOUNTER — Other Ambulatory Visit: Payer: Self-pay | Admitting: *Deleted

## 2021-06-03 NOTE — Patient Outreach (Signed)
06/03/2021  [name]: DOB: May 12, 1966  Initial telephone outreach for [facility] referral for [place] [facility] services. No answer on pt cell but able to leave a message and request a return call.  [name]. [name], MSN, [facility] Gerontological Nurse Practitioner [facility] Care Management [phone]

## 2021-06-04 ENCOUNTER — Other Ambulatory Visit: Payer: Self-pay | Admitting: *Deleted

## 2021-06-04 ENCOUNTER — Encounter (INDEPENDENT_AMBULATORY_CARE_PROVIDER_SITE_OTHER): Payer: Self-pay

## 2021-06-04 ENCOUNTER — Other Ambulatory Visit (HOSPITAL_COMMUNITY): Payer: Self-pay

## 2021-06-04 DIAGNOSIS — E1110 Type 2 diabetes mellitus with ketoacidosis without coma: Secondary | ICD-10-CM | POA: Diagnosis not present

## 2021-06-04 NOTE — Patient Outreach (Signed)
[facility]) Care Management  06/04/2021  [name] [birth year] [id number]  Second telephone outreach for [facility] Referral, no answer, left a message. Advised NP will send a letter, but did encourage [name] to return my call.  [name]. [name], MSN, [facility] Gerontological Nurse Practitioner [facility] Care Management [phone]

## 2021-06-04 NOTE — Progress Notes (Signed)
Chief Complaint:   [name] is here to discuss his progress with his Omar treatment plan along with follow-up of his Omar related diagnoses. See Medical Weight Management Flowsheet for complete bioelectrical impedance results.  Today's visit was #: 74 Starting weight: 199 lbs Starting date: 03/14/2018 Weight change since last visit: 17 lbs Total lbs lost to date: 25 lbs Total weight loss percentage to date: -12.56%  Nutrition Plan: Liquid diet for 100% of the time. Activity: Increased walking. Anti-Omar medications: Mounjaro 15 mg subcutaneously weekly. Reported side effects: None.  Interim History: Obe was recently hospitalized.  We reviewed the notes together.  Status post multiple teeth removed.  He is on a liquid/pureed diet.  He says his insurance changed this his to [facility] and he hates it.    Assessment/Plan:   1. Abrasion of right forearm, sequela Start Bactroban topically twice daily for abrasion on right forearm.  - Start mupirocin ointment (BACTROBAN) 2 %; Apply topically twice daily  Dispense: 22 g; Refill: 2  2. Type 2 diabetes mellitus with other specified complication, with long-term current use of insulin (HCC) Diabetes Mellitus: Not at goal. Medication: Basaglar 45 units twice daily, Humalog 35 units four times daily, Mounjaro 15 mg subcutaneously weekly. Issues reviewed: blood sugar goals, complications of diabetes mellitus, hypoglycemia prevention and treatment, exercise, and nutrition.  Plan: Continue all medications as prescribed.  The patient will continue to focus on protein-rich, low simple carbohydrate foods. We reviewed the importance of hydration, regular exercise for stress reduction, and restorative sleep. Endocrinology is working on the prior authorization for a [employer].  Lab Results  Component Value Date   HGBA1C 9.1 (H) 04/21/2021   HGBA1C 9.2 (H) 09/23/2020   HGBA1C 8.8 (H) 01/23/2020   Lab Results  Component Value  Date   MICROALBUR 1.08 12/20/2013   LDLCALC 73 04/21/2021   CREATININE 0.78 05/30/2021   3. B12 deficiency Start Vitamin B12 injections once weekly for 4 weeks, then once monthly.   Lab Results  Component Value Date   VITAMINB12 273 04/21/2021   - Start cyanocobalamin (,VITAMIN B-12,) 1000 MCG/ML injection; Inject 1 ml weekly for 4 weeks as directed. Then inject monthly  Dispense: 10 mL; Refill: 0 - SYRINGE-NEEDLE, DISP, 3 ML (B-D 3CC LUER-LOK SYR 25GX1") 25G X 1" 3 ML MISC; Use to inject B12 as directed  Dispense: 10 each; Refill: 0  4. Vitamin D deficiency Improving, but not optimized. He is taking vitamin D 50,000 IU every 2 weeks.  Plan: Continue to take prescription Vitamin D @50 ,000 IU every 2 weeks as prescribed.  Follow-up for routine testing of Vitamin D, at least 2-3 times per year to avoid over-replacement.  Lab Results  Component Value Date   VD25OH 49.9 04/21/2021   VD25OH 27.4 (L) 09/23/2020   VD25OH 24.8 (L) 01/23/2020   5. Omar, current BMI is 28.1  Course: [name] is currently in the action stage of change. As such, his goal is to continue with weight loss efforts.   Nutrition goals: He has agreed to a liquid diet.   Exercise goals:  As is.  Behavioral modification strategies: increasing lean protein intake, decreasing simple carbohydrates, increasing vegetables, and increasing water intake.  Manjot has agreed to follow-up with our clinic in 4 weeks. He was informed of the importance of frequent follow-up visits to maximize his success with intensive lifestyle modifications for his multiple health conditions.   Objective:   Blood pressure 129/72, pulse 94, temperature 98 F (36.7  C), temperature source Oral, height 5\' 6"  (1.676 m), weight 174 lb (78.9 kg), SpO2 97 %. Body mass index is 28.08 kg/m.  General: Cooperative, alert, well developed, in no acute distress. HEENT: Conjunctivae and lids unremarkable. Cardiovascular: Regular rhythm.  Lungs: Normal  work of breathing. Neurologic: No focal deficits.   Lab Results  Component Value Date   CREATININE 0.78 05/30/2021   BUN 23 (H) 05/30/2021   NA 145 05/30/2021   K 4.0 05/30/2021   CL 110 05/30/2021   CO2 26 05/30/2021   Lab Results  Component Value Date   ALT 16 05/29/2021   AST 15 05/29/2021   ALKPHOS 65 05/29/2021   BILITOT 0.6 05/29/2021   Lab Results  Component Value Date   HGBA1C 9.1 (H) 04/21/2021   HGBA1C 9.2 (H) 09/23/2020   HGBA1C 8.8 (H) 01/23/2020   HGBA1C 8.0 (H) 09/26/2019   HGBA1C 9.6 (H) 06/27/2019   Lab Results  Component Value Date   INSULIN 2.2 (L) 09/23/2020   Lab Results  Component Value Date   TSH 1.620 04/21/2021   Lab Results  Component Value Date   CHOL 138 04/21/2021   HDL 38 (L) 04/21/2021   LDLCALC 73 04/21/2021   TRIG 156 (H) 04/21/2021   CHOLHDL 3.6 04/21/2021   Lab Results  Component Value Date   VD25OH 49.9 04/21/2021   VD25OH 27.4 (L) 09/23/2020   VD25OH 24.8 (L) 01/23/2020   Lab Results  Component Value Date   WBC 6.9 05/30/2021   HGB 12.9 (L) 05/30/2021   HCT 39.4 05/30/2021   MCV 87.0 05/30/2021   PLT 246 05/30/2021   Attestation Statements:   Reviewed by clinician on day of visit: allergies, medications, problem list, medical history, surgical history, family history, social history, and previous encounter notes.  Time spent on visit including pre-visit chart review and post-visit care and documentation was 43 minutes. Time was spent on: Food choices and timing of food intake reviewed today. I discussed a personalized meal plan with the patient that will help him to lose weight and will improve his Omar-related conditions going forward. I performed a medically necessary appropriate examination and/or evaluation. I discussed the assessment and treatment plan with the patient. Motivational interviewing as well as evidence-based interventions for health behavior change were utilized today including the discussion of self  monitoring techniques, problem-solving barriers and SMART goal setting techniques.  The patient was provided an opportunity to ask questions and all were answered. The patient agreed with the plan and demonstrated an understanding of the instructions. Clinical information was updated and documented in the EMR. I, Water quality scientist, CMA, am acting as transcriptionist for Briscoe Deutscher, DO  I have reviewed the above documentation for accuracy and completeness, and I agree with the above. -  Briscoe Deutscher, DO, MS, FAAFP, DABOM - Family and Bariatric Medicine.

## 2021-06-05 ENCOUNTER — Other Ambulatory Visit (HOSPITAL_COMMUNITY): Payer: Self-pay

## 2021-06-11 ENCOUNTER — Other Ambulatory Visit (HOSPITAL_COMMUNITY): Payer: Self-pay

## 2021-06-12 ENCOUNTER — Other Ambulatory Visit (HOSPITAL_COMMUNITY): Payer: Self-pay

## 2021-06-16 ENCOUNTER — Ambulatory Visit (INDEPENDENT_AMBULATORY_CARE_PROVIDER_SITE_OTHER): Payer: 59 | Admitting: Pharmacist

## 2021-06-16 ENCOUNTER — Other Ambulatory Visit: Payer: Self-pay

## 2021-06-16 DIAGNOSIS — B20 Human immunodeficiency virus [HIV] disease: Secondary | ICD-10-CM

## 2021-06-16 MED ORDER — CABOTEGRAVIR & RILPIVIRINE ER 600 & 900 MG/3ML IM SUER
1.0000 | Freq: Once | INTRAMUSCULAR | Status: AC
  Administered 2021-06-16: 1 via INTRAMUSCULAR

## 2021-06-16 NOTE — Progress Notes (Signed)
HPI: [name] is a 56 y.o. male who presents to the [facility] for [place] administration.  Patient Active Problem List   Diagnosis Date Noted   Thrombocytosis 05/28/2021   DKA (diabetic ketoacidosis) ([place]) 03/05/2020   High anion gap metabolic acidosis 71/10/2692   Leukocytosis 03/05/2020   Dehydration 03/05/2020   Hyponatremia 03/05/2020   Hyperkalemia 03/05/2020   Hyperglycemia due to diabetes mellitus ([place]) 03/05/2020   Intractable nausea and vomiting 03/05/2020   DKA, type 2 ([place]) 03/05/2020   Other fatigue 03/14/2018   Shortness of breath on exertion 03/14/2018   Uncontrolled type 2 diabetes mellitus with hyperglycemia ([place]) 02/08/2018   Mixed hyperlipidemia 02/08/2018   AKI (acute kidney injury) ([place]) 03/23/2017   Special screening for malignant neoplasms, colon 11/18/2016   Family history of colon cancer 11/18/2016   Lung nodule, multiple 09/27/2014   Poorly controlled type 2 diabetes mellitus with circulatory disorder ([place]) 09/27/2014   Early syphilis, 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   Hypercholesterolemia 01/25/2013   Ejection fraction    Essential hypertension, benign    Human immunodeficiency virus (HIV) disease ([place]) 09/11/2011   Kidney stone on left side 01/04/2011   DKA (diabetic ketoacidoses) 01/04/2011   Coronary artery disease due to lipid rich plaque    Secondary syphilis    LFTs abnormal     Patient's Medications  New Prescriptions   No medications on file  Previous Medications   ASPIRIN 81 MG TABLET    Take 1 tablet (81 mg total) by mouth daily with breakfast.   CABOTEGRAVIR & RILPIVIRINE ER (CABENUVA) 600 & 900 MG/3ML INJECTION    Inject 1 kit into the muscle every 2 (two) months.   CHLORTHALIDONE (HYGROTON) 25 MG TABLET    Take 0.5 tablets (12.5 mg total) by mouth daily.   CONTINUOUS BLOOD GLUC SENSOR (FREESTYLE LIBRE 14 DAY SENSOR) MISC    Use as directed   CYANOCOBALAMIN (,VITAMIN B-12,) 1000 MCG/ML INJECTION    Inject 1 ml weekly  for 4 weeks as directed. Then inject monthly   ESCITALOPRAM (LEXAPRO) 10 MG TABLET    Take 1 tablet (10 mg total) by mouth daily.   EZETIMIBE (ZETIA) 10 MG TABLET    Take 1 tablet (10 mg total) by mouth daily.   INSULIN GLARGINE (BASAGLAR KWIKPEN) 100 UNIT/ML    Inject 45 Units into the skin 2 (two) times daily.   INSULIN LISPRO (HUMALOG KWIKPEN) 100 UNIT/ML KWIKPEN    INJECT 35 UNITS 4 TIMES DAILY   INSULIN LISPRO (HUMALOG) 100 UNIT/ML KWIKPEN    Inject 35 Units subcutaneously 4 times a day   INSULIN PEN NEEDLE 31G X 5 MM MISC    Use as directed   LISINOPRIL (ZESTRIL) 10 MG TABLET    TAKE 1 TABLET BY MOUTH EVERY DAY   MULTIPLE VITAMINS-MINERALS (MULTIVITAMIN WITH MINERALS) TABLET    Take 1 tablet by mouth daily.   MUPIROCIN OINTMENT (BACTROBAN) 2 %    Apply topically twice daily   PROPRANOLOL (INDERAL) 20 MG TABLET    Take 1 tablet (20 mg total) by mouth 3 (three) times daily.   ROSUVASTATIN (CRESTOR) 40 MG TABLET    Take 1 tablet (40 mg total) by mouth daily.   SYRINGE-NEEDLE, DISP, 3 ML (B-D 3CC LUER-LOK SYR 25GX1") 25G X 1" 3 ML MISC    Use to inject B12 as directed   TIRZEPATIDE (MOUNJARO) 15 MG/0.5ML PEN    Inject 15 mg into the skin once a week.   VITAMIN D,  ERGOCALCIFEROL, (DRISDOL) 1.25 MG (50000 UNIT) CAPS CAPSULE    Take 1 capsule by mouth 2 times a week on Sunday & Wednesday  Modified Medications   No medications on file  Discontinued Medications   No medications on file    Allergies: Allergies  Allergen Reactions   Pepcid [Famotidine] Other (See Comments)    Contraindicated with ODEFSEY   Prilosec [Omeprazole] Other (See Comments)    Contraindicated with RPV (lowers level of this ARV in ODEFSEY   Odefsey [Emtricitabine-Rilpivirine-Tenofovir Af]     Makes blood sugars run high    Past Medical History: Past Medical History:  Diagnosis Date   CAD (coronary artery disease)    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   Syphilitic hepatitis    Type 2 diabetes mellitus ([place])     Social History: Social History   Socioeconomic History   Marital status: Single    Spouse name: Not on file   Number of children: 0   Years of education: Not on file   Highest education level: Not on file  Occupational History   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  Tobacco Use   Smoking status: Never   Smokeless tobacco: Never  Vaping Use   Vaping Use: Never used  Substance and Sexual Activity   Alcohol use: Not Currently    Alcohol/week: 0.0 - 1.0 standard drinks    Comment: Social drinker, vodka, once a month   Drug use: No   Sexual activity: Not Currently    Partners: Male    Comment: declined condoms  Other Topics Concern   Not on file  Social History Narrative   Not on file   Social Determinants of Health   Financial Resource Strain: Not on file  Food Insecurity: Not on file  Transportation Needs: Not on file  Physical Activity: Not on file  Stress: Not on file  Social Connections: Not on file    Labs: Lab Results  Component Value Date   HIV1RNAQUANT 53 (H) 03/18/2021   HIV1RNAQUANT <20 03/18/2020   HIV1RNAQUANT <20 NOT DETECTED 03/14/2019   CD4TABS 897 03/18/2021   CD4TABS 1,066 03/18/2020   CD4TABS 880 03/14/2019    RPR and STI Lab Results  Component Value Date   LABRPR NON-REACTIVE 03/18/2020   LABRPR NON-REACTIVE 03/14/2019   LABRPR NON-REACTIVE 02/22/2018   LABRPR NON REAC 11/30/2016   LABRPR NON REAC 10/28/2015    STI Results GC CT  02/22/2018 Negative Negative  11/30/2016 Negative Negative  10/28/2015 Negative  Negative  09/13/2014 Negative Negative    Hepatitis B No results found for: HEPBSAB, HEPBSAG, HEPBCAB Hepatitis C No results found for: HEPCAB, HCVRNAPCRQN Hepatitis A No results found for: HAV Lipids: Lab Results  Component Value Date   CHOL 138 04/21/2021   TRIG 156 (H) 04/21/2021   HDL 38 (L) 04/21/2021   CHOLHDL 3.6 04/21/2021   VLDL 59 (H) 10/28/2015   LDLCALC 73 04/21/2021    TARGET DATE: The 25th of the month  Assessment: Lyndel presents today for their maintenance Cabenuva injections.  Initial/past injections were tolerated well without issues. No problems with systemic side effects of injections. Patient did experience pain at the injection site, but has gone away on its own. Will check HIV RNA today.   Administered cabotegravir 614m/3mL in left upper outer quadrant of the gluteal muscle. Administered rilpivirine 900 mg/318min the right upper outer quadrant of the gluteal muscle. Monitored patient for 10 minutes after injection. Injections were tolerated well without issue. Patient will follow up in 2 months for next set of injections.  Plan: - Cabenuva injections administered - Check HIV RNA today - Next injections scheduled for 08/18/21 with Cassie, 10/14/21 with Dr. VaTommy Medaland 12/15/21 with Cassie - Call with any issues or questions  YuMichela PitcherChOdinPharmD Student

## 2021-06-18 LAB — HIV-1 RNA QUANT-NO REFLEX-BLD
HIV 1 RNA Quant: NOT DETECTED Copies/mL
HIV-1 RNA Quant, Log: NOT DETECTED Log cps/mL

## 2021-06-24 DIAGNOSIS — M542 Cervicalgia: Secondary | ICD-10-CM | POA: Diagnosis not present

## 2021-07-10 ENCOUNTER — Encounter (INDEPENDENT_AMBULATORY_CARE_PROVIDER_SITE_OTHER): Payer: Self-pay | Admitting: Family Medicine

## 2021-07-10 ENCOUNTER — Ambulatory Visit (INDEPENDENT_AMBULATORY_CARE_PROVIDER_SITE_OTHER): Payer: 59 | Admitting: Family Medicine

## 2021-07-10 ENCOUNTER — Other Ambulatory Visit (HOSPITAL_COMMUNITY): Payer: Self-pay

## 2021-07-10 ENCOUNTER — Other Ambulatory Visit: Payer: Self-pay

## 2021-07-10 VITALS — BP 115/69 | HR 70 | Temp 97.8°F | Ht 66.0 in | Wt 175.0 lb

## 2021-07-10 DIAGNOSIS — E538 Deficiency of other specified B group vitamins: Secondary | ICD-10-CM

## 2021-07-10 DIAGNOSIS — F419 Anxiety disorder, unspecified: Secondary | ICD-10-CM | POA: Diagnosis not present

## 2021-07-10 DIAGNOSIS — Z6831 Body mass index (BMI) 31.0-31.9, adult: Secondary | ICD-10-CM

## 2021-07-10 DIAGNOSIS — E1169 Type 2 diabetes mellitus with other specified complication: Secondary | ICD-10-CM | POA: Diagnosis not present

## 2021-07-10 DIAGNOSIS — E559 Vitamin D deficiency, unspecified: Secondary | ICD-10-CM

## 2021-07-10 DIAGNOSIS — Z6828 Body mass index (BMI) 28.0-28.9, adult: Secondary | ICD-10-CM | POA: Diagnosis not present

## 2021-07-10 DIAGNOSIS — Z794 Long term (current) use of insulin: Secondary | ICD-10-CM | POA: Diagnosis not present

## 2021-07-10 DIAGNOSIS — E669 Obesity, unspecified: Secondary | ICD-10-CM

## 2021-07-10 MED ORDER — CYANOCOBALAMIN 1000 MCG/ML IJ SOLN
1000.0000 ug | INTRAMUSCULAR | 2 refills | Status: DC
  Filled 2021-07-10: qty 6, 42d supply, fill #0
  Filled 2021-08-14: qty 6, 42d supply, fill #1
  Filled 2021-11-09: qty 6, 42d supply, fill #2
  Filled 2021-12-17: qty 6, 84d supply, fill #3
  Filled 2022-03-11 – 2022-03-12 (×2): qty 6, 84d supply, fill #4
  Filled 2022-03-19: qty 3, 84d supply, fill #4
  Filled 2022-06-05: qty 3, 84d supply, fill #5

## 2021-07-10 MED ORDER — PROPRANOLOL HCL 20 MG PO TABS
20.0000 mg | ORAL_TABLET | Freq: Three times a day (TID) | ORAL | 0 refills | Status: DC
  Filled 2021-07-10: qty 90, 30d supply, fill #0

## 2021-07-10 MED ORDER — VITAMIN D (ERGOCALCIFEROL) 1.25 MG (50000 UNIT) PO CAPS
50000.0000 [IU] | ORAL_CAPSULE | ORAL | 0 refills | Status: DC
  Filled 2021-07-10: qty 8, 28d supply, fill #0

## 2021-07-14 NOTE — Progress Notes (Signed)
Chief Complaint:   OBESITY [name] is here to discuss his progress with his obesity treatment plan along with follow-up of his obesity related diagnoses. See Medical Weight Management Flowsheet for complete bioelectrical impedance results.  Today's visit was #: 31 Starting weight: 199 lbs Starting date: 03/14/2018 Weight change since last visit: +1 lb Total lbs lost to date: 24 lbs Total weight loss percentage to date: -12.06%  Nutrition Plan: Liquid diet for 100% of the time. Activity: Increased walking. Anti-obesity medications: Mounjaro 15 mg subcutaneously weekly. Reported side effects: None.  Interim History: Erik's BS is in the low 200s.  He endorses increased polyphagia and increased stress.  He is still on a liquid/soft diet.  Assessment/Plan:   1. Type 2 diabetes mellitus with other specified complication, with long-term current use of insulin (HCC) Diabetes Mellitus: Not at goal. Medication: Basaglar, Humalog, Mounjaro 15 mg subcutaneously weekly. Issues reviewed: blood sugar goals, complications of diabetes mellitus, hypoglycemia prevention and treatment, exercise, and nutrition.  Plan:  Continue medications as prescribed. The patient will continue to focus on protein-rich, low simple carbohydrate foods. We reviewed the importance of hydration, regular exercise for stress reduction, and restorative sleep.   Lab Results  Component Value Date   HGBA1C 9.1 (H) 04/21/2021   HGBA1C 9.2 (H) 09/23/2020   HGBA1C 8.8 (H) 01/23/2020   Lab Results  Component Value Date   MICROALBUR 1.08 12/20/2013   LDLCALC 73 04/21/2021   CREATININE 0.78 05/30/2021   2. B12 deficiency Supplementation: Monthly vitamin B12 injections.     Plan:  Continue monthly vitamin B12 injections  Lab Results  Component Value Date   VITAMINB12 273 04/21/2021   - Refill cyanocobalamin (,VITAMIN B-12,) 1000 MCG/ML injection; Inject 1 mL into the skin once weekly for 4 weeks. Then inject monthly  as directed.  Dispense: 12 mL; Refill: 2  3. Vitamin D deficiency At goal. He is taking vitamin D 50,000 IU weekly.  Plan: Continue to take prescription Vitamin D @50 ,000 IU every week as prescribed.  Follow-up for routine testing of Vitamin D, at least 2-3 times per year to avoid over-replacement.  Lab Results  Component Value Date   VD25OH 49.9 04/21/2021   VD25OH 27.4 (L) 09/23/2020   VD25OH 24.8 (L) 01/23/2020   - Refill Vitamin D, Ergocalciferol, (DRISDOL) 1.25 MG (50000 UNIT) CAPS capsule; Take 1 capsule by mouth 2 times a week on Sunday & Wednesday  Dispense: 8 capsule; Refill: 0  4. Anxiety [name] is taking propranolol 20 mg three times daily as needed for anxiety.  Plan:  Continue propranolol for anxiety.  Will refill today, as per below.  - Refill propranolol (INDERAL) 20 MG tablet; Take 1 tablet by mouth 3 times daily.  Dispense: 90 tablet; Refill: 0  5. Obesity, current BMI is 28.2  Course: [name] is currently in the action stage of change. As such, his goal is to continue with weight loss efforts.   Nutrition goals: He has agreed to a liquid diet.   Exercise goals:  As is.  Behavioral modification strategies: increasing lean protein intake, decreasing simple carbohydrates, and increasing vegetables.  [name] has agreed to follow-up with our clinic in 4 weeks. He was informed of the importance of frequent follow-up visits to maximize his success with intensive lifestyle modifications for his multiple health conditions.   Objective:   Blood pressure 115/69, pulse 70, temperature 97.8 F (36.6 C), temperature source Oral, height 5\' 6"  (1.676 m), weight 175 lb (79.4 kg), SpO2 98 %.  Body mass index is 28.25 kg/m.  General: Cooperative, alert, well developed, in no acute distress. HEENT: Conjunctivae and lids unremarkable. Cardiovascular: Regular rhythm.  Lungs: Normal work of breathing. Neurologic: No focal deficits.   Lab Results  Component Value Date    CREATININE 0.78 05/30/2021   BUN 23 (H) 05/30/2021   NA 145 05/30/2021   K 4.0 05/30/2021   CL 110 05/30/2021   CO2 26 05/30/2021   Lab Results  Component Value Date   ALT 16 05/29/2021   AST 15 05/29/2021   ALKPHOS 65 05/29/2021   BILITOT 0.6 05/29/2021   Lab Results  Component Value Date   HGBA1C 9.1 (H) 04/21/2021   HGBA1C 9.2 (H) 09/23/2020   HGBA1C 8.8 (H) 01/23/2020   HGBA1C 8.0 (H) 09/26/2019   HGBA1C 9.6 (H) 06/27/2019   Lab Results  Component Value Date   INSULIN 2.2 (L) 09/23/2020   Lab Results  Component Value Date   TSH 1.620 04/21/2021   Lab Results  Component Value Date   CHOL 138 04/21/2021   HDL 38 (L) 04/21/2021   LDLCALC 73 04/21/2021   TRIG 156 (H) 04/21/2021   CHOLHDL 3.6 04/21/2021   Lab Results  Component Value Date   VD25OH 49.9 04/21/2021   VD25OH 27.4 (L) 09/23/2020   VD25OH 24.8 (L) 01/23/2020   Lab Results  Component Value Date   WBC 6.9 05/30/2021   HGB 12.9 (L) 05/30/2021   HCT 39.4 05/30/2021   MCV 87.0 05/30/2021   PLT 246 05/30/2021   Attestation Statements:   Reviewed by clinician on day of visit: allergies, medications, problem list, medical history, surgical history, family history, social history, and previous encounter notes.  I, Water quality scientist, CMA, am acting as transcriptionist for Briscoe Deutscher, DO  I have reviewed the above documentation for accuracy and completeness, and I agree with the above. -  Briscoe Deutscher, DO, MS, FAAFP, DABOM - Family and Bariatric Medicine.

## 2021-07-21 ENCOUNTER — Other Ambulatory Visit (HOSPITAL_COMMUNITY): Payer: Self-pay

## 2021-07-21 DIAGNOSIS — E1122 Type 2 diabetes mellitus with diabetic chronic kidney disease: Secondary | ICD-10-CM | POA: Diagnosis not present

## 2021-07-21 DIAGNOSIS — I10 Essential (primary) hypertension: Secondary | ICD-10-CM | POA: Diagnosis not present

## 2021-08-04 ENCOUNTER — Other Ambulatory Visit (HOSPITAL_COMMUNITY): Payer: Self-pay

## 2021-08-08 ENCOUNTER — Other Ambulatory Visit (HOSPITAL_COMMUNITY): Payer: Self-pay

## 2021-08-11 ENCOUNTER — Telehealth: Payer: Self-pay

## 2021-08-11 NOTE — Telephone Encounter (Signed)
[facility] Patient Advocate Encounter ? ?Patient's medication [name]) have been couriered to [facility] from [facility] and will be administered on the patient next office visit on 08/18/21. ? ?[name] , CPhT ?Specialty Pharmacy Patient Advocate ?[facility] ?Phone: [phone] ?Fax:  [phone]  ?

## 2021-08-14 ENCOUNTER — Other Ambulatory Visit (HOSPITAL_COMMUNITY): Payer: Self-pay

## 2021-08-18 ENCOUNTER — Other Ambulatory Visit: Payer: Self-pay

## 2021-08-18 ENCOUNTER — Ambulatory Visit (INDEPENDENT_AMBULATORY_CARE_PROVIDER_SITE_OTHER): Payer: 59 | Admitting: Family Medicine

## 2021-08-18 ENCOUNTER — Ambulatory Visit (INDEPENDENT_AMBULATORY_CARE_PROVIDER_SITE_OTHER): Payer: 59 | Admitting: Pharmacist

## 2021-08-18 ENCOUNTER — Encounter (INDEPENDENT_AMBULATORY_CARE_PROVIDER_SITE_OTHER): Payer: Self-pay | Admitting: Family Medicine

## 2021-08-18 ENCOUNTER — Other Ambulatory Visit (HOSPITAL_COMMUNITY)
Admission: RE | Admit: 2021-08-18 | Discharge: 2021-08-18 | Disposition: A | Discharge status: Home / Self Care | Payer: 59 | Source: Ambulatory Visit | Attending: Infectious Disease | Admitting: Infectious Disease

## 2021-08-18 VITALS — BP 136/83 | HR 112 | Temp 97.6°F | Ht 66.0 in | Wt 177.0 lb

## 2021-08-18 DIAGNOSIS — E669 Obesity, unspecified: Secondary | ICD-10-CM | POA: Diagnosis not present

## 2021-08-18 DIAGNOSIS — E538 Deficiency of other specified B group vitamins: Secondary | ICD-10-CM | POA: Diagnosis not present

## 2021-08-18 DIAGNOSIS — Z23 Encounter for immunization: Secondary | ICD-10-CM

## 2021-08-18 DIAGNOSIS — B20 Human immunodeficiency virus [HIV] disease: Secondary | ICD-10-CM | POA: Insufficient documentation

## 2021-08-18 DIAGNOSIS — Z7985 Long-term (current) use of injectable non-insulin antidiabetic drugs: Secondary | ICD-10-CM

## 2021-08-18 DIAGNOSIS — K0889 Other specified disorders of teeth and supporting structures: Secondary | ICD-10-CM

## 2021-08-18 DIAGNOSIS — Z794 Long term (current) use of insulin: Secondary | ICD-10-CM

## 2021-08-18 DIAGNOSIS — E1169 Type 2 diabetes mellitus with other specified complication: Secondary | ICD-10-CM | POA: Diagnosis not present

## 2021-08-18 DIAGNOSIS — Z6828 Body mass index (BMI) 28.0-28.9, adult: Secondary | ICD-10-CM

## 2021-08-18 MED ORDER — CABOTEGRAVIR & RILPIVIRINE ER 600 & 900 MG/3ML IM SUER
1.0000 | Freq: Once | INTRAMUSCULAR | Status: AC
  Administered 2021-08-18: 1 via INTRAMUSCULAR

## 2021-08-18 NOTE — Progress Notes (Signed)
? ?HPI: [name] is a 56 y.o. male who presents to the [facility] for [place] administration. ? ?Patient Active Problem List  ? Diagnosis Date Noted  ? Thrombocytosis 05/28/2021  ? DKA (diabetic ketoacidosis) ([place]) 03/05/2020  ? High anion gap metabolic acidosis 33/83/2919  ? Leukocytosis 03/05/2020  ? Dehydration 03/05/2020  ? Hyponatremia 03/05/2020  ? Hyperkalemia 03/05/2020  ? Hyperglycemia due to diabetes mellitus ([place]) 03/05/2020  ? Intractable nausea and vomiting 03/05/2020  ? DKA, type 2 ([place]) 03/05/2020  ? Other fatigue 03/14/2018  ? Shortness of breath on exertion 03/14/2018  ? Uncontrolled type 2 diabetes mellitus with hyperglycemia ([place]) 02/08/2018  ? Mixed hyperlipidemia 02/08/2018  ? AKI (acute kidney injury) ([place]) 03/23/2017  ? Special screening for malignant neoplasms, colon 11/18/2016  ? Family history of colon cancer 11/18/2016  ? Lung nodule, multiple 09/27/2014  ? Poorly controlled type 2 diabetes mellitus with circulatory disorder ([place]) 09/27/2014  ? Early syphilis, 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  ? Hypercholesterolemia 01/25/2013  ? Ejection fraction   ? Essential hypertension, benign   ? Human immunodeficiency virus (HIV) disease ([place]) 09/11/2011  ? Kidney stone on left side 01/04/2011  ? DKA (diabetic ketoacidoses) 01/04/2011  ? Coronary artery disease due to lipid rich plaque   ? Secondary syphilis   ? LFTs abnormal   ? ? ?Patient's Medications  ?New Prescriptions  ? No medications on file  ?Previous Medications  ? ASPIRIN 81 MG TABLET    Take 1 tablet (81 mg total) by mouth daily with breakfast.  ? CABOTEGRAVIR & RILPIVIRINE ER (CABENUVA) 600 & 900 MG/3ML INJECTION    Inject 1 kit into the muscle every 2 (two) months.  ? CHLORTHALIDONE (HYGROTON) 25 MG TABLET    Take 0.5 tablets (12.5 mg total) by mouth daily.  ? CONTINUOUS BLOOD GLUC SENSOR (FREESTYLE LIBRE 14 DAY SENSOR) MISC    Use as directed  ? CYANOCOBALAMIN (,VITAMIN B-12,) 1000 MCG/ML INJECTION    Inject 1 mL into  the skin once weekly for 4 weeks. Then inject monthly as directed.  ? ESCITALOPRAM (LEXAPRO) 10 MG TABLET    Take 1 tablet (10 mg total) by mouth daily.  ? EZETIMIBE (ZETIA) 10 MG TABLET    Take 1 tablet (10 mg total) by mouth daily.  ? INSULIN GLARGINE (BASAGLAR KWIKPEN) 100 UNIT/ML    Inject 45 Units into the skin 2 (two) times daily.  ? INSULIN LISPRO (HUMALOG KWIKPEN) 100 UNIT/ML KWIKPEN    INJECT 35 UNITS 4 TIMES DAILY  ? INSULIN LISPRO (HUMALOG) 100 UNIT/ML KWIKPEN    Inject 35 Units subcutaneously 4 times a day  ? INSULIN PEN NEEDLE 31G X 5 MM MISC    Use as directed  ? LISINOPRIL (ZESTRIL) 10 MG TABLET    TAKE 1 TABLET BY MOUTH EVERY DAY  ? MULTIPLE VITAMINS-MINERALS (MULTIVITAMIN WITH MINERALS) TABLET    Take 1 tablet by mouth daily.  ? MUPIROCIN OINTMENT (BACTROBAN) 2 %    Apply topically twice daily  ? PROPRANOLOL (INDERAL) 20 MG TABLET    Take 1 tablet by mouth 3 times daily.  ? ROSUVASTATIN (CRESTOR) 40 MG TABLET    Take 1 tablet (40 mg total) by mouth daily.  ? SYRINGE-NEEDLE, DISP, 3 ML (B-D 3CC LUER-LOK SYR 25GX1") 25G X 1" 3 ML MISC    Use to inject B12 as directed  ? TIRZEPATIDE (MOUNJARO) 15 MG/0.5ML PEN    Inject 15 mg into the skin once a week.  ? VITAMIN D,  ERGOCALCIFEROL, (DRISDOL) 1.25 MG (50000 UNIT) CAPS CAPSULE    Take 1 capsule by mouth 2 times a week on Sunday & Wednesday  ?Modified Medications  ? No medications on file  ?Discontinued Medications  ? No medications on file  ? ? ?Allergies: ?Allergies  ?Allergen Reactions  ? Pepcid [Famotidine] Other (See Comments)  ?  Contraindicated with ODEFSEY  ? Prilosec [Omeprazole] Other (See Comments)  ?  Contraindicated with RPV (lowers level of this ARV in ODEFSEY  ? Odefsey [Emtricitabine-Rilpivirine-Tenofovir Af]   ?  Makes blood sugars run high  ? ? ?Past Medical History: ?Past Medical History:  ?Diagnosis Date  ? CAD (coronary artery disease)   ? 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 Diabetes mellitus  type II   ? Essential hypertension   ? History of heart attack   ? History of kidney stones   ? HIV (human immunodeficiency virus infection) ([place])   ? Diagnosed 2002  ? LFTs abnormal   ? Mixed hyperlipidemia   ? Secondary syphilis   ? 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 RPR  reverted to negative  ? Syphilitic hepatitis   ? Type 2 diabetes mellitus ([place])   ? ? ?Social History: ?Social History  ? ?Socioeconomic History  ? Marital status: Single  ?  Spouse name: Not on file  ? Number of children: 0  ? Years of education: Not on file  ? Highest education level: Not on file  ?Occupational History  ? 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  ?Tobacco Use  ? Smoking status: Never  ? Smokeless tobacco: Never  ?Vaping Use  ? Vaping Use: Never used  ?Substance and Sexual Activity  ? Alcohol use: Not Currently  ?  Alcohol/week: 0.0 - 1.0 standard drinks  ?  Comment: Social drinker, vodka, once a month  ? Drug use: No  ? Sexual activity: Not Currently  ?  Partners: Male  ?  Comment: declined condoms  ?Other Topics Concern  ? Not on file  ?Social History Narrative  ? Not on file  ? ?Social Determinants of Health  ? ?Financial Resource Strain: Not on file  ?Food Insecurity: Not on file  ?Transportation Needs: Not on file  ?Physical Activity: Not on file  ?Stress: Not on file  ?Social Connections: Not on file  ? ? ?Labs: ?Lab Results  ?Component Value Date  ? HIV1RNAQUANT Not Detected 06/16/2021  ? HIV1RNAQUANT 53 (H) 03/18/2021  ? HIV1RNAQUANT <20 03/18/2020  ? CD4TABS 897 03/18/2021  ? CD4TABS 1,066 03/18/2020  ? CD4TABS 880 03/14/2019  ? ? ?RPR and STI ?Lab Results  ?Component Value Date  ? LABRPR NON-REACTIVE 03/18/2020  ? LABRPR NON-REACTIVE 03/14/2019  ? LABRPR NON-REACTIVE 02/22/2018  ? LABRPR NON REAC 11/30/2016  ? LABRPR NON REAC 10/28/2015  ? ? ?STI Results GC CT  ?02/22/2018 ?12:00 AM Negative   Negative    ?11/30/2016 ?12:00 AM Negative   Negative     ?10/28/2015 ?12:00 AM Negative   Negative    ?09/13/2014 ?12:00 AM Negative   Negative    ? ? ?Hepatitis B ?No results found for: HEPBSAB, HEPBSAG, HEPBCAB ?Hepatitis C ?No results found for: [place], HCVRNAPCRQN ?Hepatitis A ?No results found for: HAV ?Lipids: ?Lab Results  ?Component Value Date  ? CHOL 138 04/21/2021  ? TRIG 156 (H) 04/21/2021  ? HDL 38 (L) 04/21/2021  ? CHOLHDL 3.6 04/21/2021  ? VLDL 59 (H) 10/28/2015  ?  [place] 73 04/21/2021  ? ? ?TARGET DATE:  The 25th of the month ? ?Assessment: ?[name] presents today for their maintenance Cabenuva injections. Initial/past injections were tolerated well without issues. No problems with systemic effects of injections. Patient did experience mild injection site pain the first time he got [place] but none since.  ? ?Administered cabotegravir 667m/3mL in left upper outer quadrant of the gluteal muscle. Administered rilpivirine 900 mg/374min the right upper outer quadrant of the gluteal muscle. Monitored patient for 10 minutes after injection. Injections were tolerated well without issue. Patient will follow up in 2 months for next injection. ? ?[name] any new sexual partners. No signs or symptoms of STIs. He has not gotten any STI testing done in some time, will get rectal/oral/urine cytologies today as recommended by guidelines. ? ?Immunizations: At this time, [name] eligible for Shingrix- however our clinic does not carry this vaccine. [facility] Counseled him about the benefits of the vaccine. He was amenable. Additionally, he does not currently have a complete series of Hep A or documentation available of Hep B vaccines (he states he received Hep B)- will check for Ab to both and an antigen to Hep B. ? ?Plan: ?- Cabenuva injections administered ?- [facility] Cassie ?- F/U HIV RNA, Urine/Rectal/Oral cytologies, RPR, Hep A Ab, Hep B Ab, Hep B Antigen ?- Referred for  Shingrix vaccine ?- Call with any issues or questions ? ?Thank you for allowing pharmacy to be apart of this patient's care ? ?AnAsher MuirPharmD Candidate ?

## 2021-08-19 ENCOUNTER — Encounter: Payer: Self-pay | Admitting: Student

## 2021-08-19 ENCOUNTER — Other Ambulatory Visit (HOSPITAL_COMMUNITY): Payer: Self-pay

## 2021-08-19 ENCOUNTER — Ambulatory Visit: Payer: 59 | Admitting: Student

## 2021-08-19 VITALS — BP 126/78 | HR 77 | Ht 66.0 in | Wt 183.2 lb

## 2021-08-19 DIAGNOSIS — I10 Essential (primary) hypertension: Secondary | ICD-10-CM

## 2021-08-19 DIAGNOSIS — E785 Hyperlipidemia, unspecified: Secondary | ICD-10-CM

## 2021-08-19 DIAGNOSIS — I2510 Atherosclerotic heart disease of native coronary artery without angina pectoris: Secondary | ICD-10-CM

## 2021-08-19 DIAGNOSIS — E118 Type 2 diabetes mellitus with unspecified complications: Secondary | ICD-10-CM | POA: Diagnosis not present

## 2021-08-19 LAB — CYTOLOGY, (ORAL, ANAL, URETHRAL) ANCILLARY ONLY
Chlamydia: NEGATIVE
Chlamydia: NEGATIVE
Comment: NEGATIVE
Comment: NEGATIVE
Comment: NORMAL
Comment: NORMAL
Neisseria Gonorrhea: NEGATIVE
Neisseria Gonorrhea: NEGATIVE

## 2021-08-19 LAB — URINE CYTOLOGY ANCILLARY ONLY
Chlamydia: NEGATIVE
Comment: NEGATIVE
Comment: NORMAL
Neisseria Gonorrhea: NEGATIVE

## 2021-08-19 MED ORDER — CHLORTHALIDONE 25 MG PO TABS
25.0000 mg | ORAL_TABLET | Freq: Every day | ORAL | 3 refills | Status: DC
  Filled 2021-08-19: qty 90, 90d supply, fill #0
  Filled 2021-11-09: qty 90, 90d supply, fill #1
  Filled 2022-02-13: qty 90, 90d supply, fill #2
  Filled 2022-06-05: qty 90, 90d supply, fill #3

## 2021-08-19 MED ORDER — PROPRANOLOL HCL 20 MG PO TABS
20.0000 mg | ORAL_TABLET | Freq: Two times a day (BID) | ORAL | 3 refills | Status: DC
  Filled 2021-08-19: qty 180, 90d supply, fill #0
  Filled 2021-11-09: qty 180, 90d supply, fill #1
  Filled 2022-02-13: qty 180, 90d supply, fill #2
  Filled 2022-06-05: qty 180, 90d supply, fill #3

## 2021-08-19 NOTE — Progress Notes (Signed)
? ?Cardiology Office Note   ? ?Date:  08/19/2021  ? ?ID:  BRADYN SOWARD, DOB 1965/09/27, MRN [id number] ? ?PCP:  Asencion Noble, MD  ?Cardiologist: Rozann Lesches, MD   ? ?Chief Complaint  ?Patient presents with  ? Follow-up  ?  6 month visit  ? ? ?History of Present Illness:   ? ?[name] is a 56 y.o. male with past medical history of CAD (s/p DES to LCx in 2007, ISR and DES to LCx in 2009), HTN, HLD, Type 2 DM and HIV who presents to the office today for 77-monthfollow-up. ? ?He was last examined by Dr. MDomenic Politein 12/2020 and denied any recent anginal symptoms at that time. He was continued on ASA, Crestor, Toprol-XL and Lisinopril. Given his LDL was elevated to 104 by recent labs, he was started on Zetia 10 mg daily with plans for repeat FLP in 3 months. An FLP was obtained in 03/2021 which showed his LDL had improved to 73. ? ?[facility]. His creatinine was elevated to 2.17 at the time of admission but had improved to 0.78 at the time of discharge. ? ?In talking with the patient today, he reports multiple medical issues over the past few months with his main one being recent dental surgery with over 14 extractions performed. Reports his dietary intake has changed during this timeframe due to the need to consume more soft foods and he has lost almost 20 lbs. From a cardiac perspective, he denies any recent chest pain or dyspnea on exertion. No recent orthopnea, PND, pitting edema or palpitations. He reports good compliance with his medication regimen and his BP is well controlled at 126/78 during today's visit. ? ? ?Past Medical History:  ?Diagnosis Date  ? CAD (coronary artery disease)   ? 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 Diabetes mellitus type II   ? Essential hypertension   ? History of heart attack   ? History of kidney stones   ? [place]   ? Diagnosed 2002  ? LFTs abnormal    ? Mixed hyperlipidemia   ? Secondary syphilis   ? 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 RPR  reverted to negative  ? Syphilitic hepatitis   ? [place] 2 diabetes [place]   ? ? ?Past Surgical History:  ?Procedure Laterality Date  ? BIOPSY  12/28/2019  ? Procedure: BIOPSY;  Surgeon: RRogene Houston MD;  [facility];  Service: Endoscopy;;  ? CHOLECYSTECTOMY OPEN  07/2001  ? COLONOSCOPY N/A 05/28/2017  ? Procedure: COLONOSCOPY;  Surgeon: RRogene Houston MD;  [facility];  Service: Endoscopy;  Laterality: N/A;  200 - pt to prep in Endo  ? CORONARY ANGIOPLASTY WITH STENT PLACEMENT  03/2006; 10/2007;   ? ESOPHAGEAL DILATION N/A 12/28/2019  ? Procedure: ESOPHAGEAL DILATION;  Surgeon: RRogene Houston MD;  [facility];  Service: Endoscopy;  Laterality: N/A;  ? ESOPHAGOGASTRODUODENOSCOPY N/A 12/28/2019  ? Procedure: ESOPHAGOGASTRODUODENOSCOPY (EGD);  Surgeon: RRogene Houston MD;  [facility];  Service: Endoscopy;  Laterality: N/A;  125, per ALelon Frohlichmoved to 10:55 pt aware  ? HERNIA REPAIR    ? LAPAROSCOPIC INCISIONAL / UMBILICAL / VENTRAL HERNIA REPAIR  07/2001  ? UHR  ? POLYPECTOMY  05/28/2017  ? Procedure: POLYPECTOMY;  Surgeon: RRogene Houston MD;  Location[facility];  Service: Endoscopy;;  colon  ? ? ?Current Medications: ?Outpatient Medications Prior to Visit  ?Medication Sig Dispense Refill  ? aspirin 81 MG tablet Take 1 tablet (81 mg total) by mouth daily with breakfast. 30 tablet 3  ? cabotegravir & rilpivirine ER (CABENUVA) 600 & 900 MG/3ML injection Inject 1 kit into the muscle every 2 (two) months. 6 mL 5  ? Continuous Blood Gluc Sensor (FREESTYLE LIBRE 14 DAY SENSOR) MISC Use as directed 2 each 12  ? cyanocobalamin (,VITAMIN B-12,) 1000 MCG/ML injection Inject 1 mL into the skin once weekly for 4 weeks. Then inject monthly as directed. 12 mL 2  ? escitalopram (LEXAPRO) 10 MG tablet Take 1 tablet (10 mg total) by mouth daily.  90 tablet 0  ? ezetimibe (ZETIA) 10 MG tablet Take 1 tablet (10 mg total) by mouth daily. 90 tablet 3  ? Insulin Glargine (BASAGLAR KWIKPEN) 100 UNIT/ML Inject 45 Units into the skin 2 (two) times daily.    ? insulin lispro (HUMALOG KWIKPEN) 100 UNIT/ML KwikPen INJECT 35 UNITS 4 TIMES DAILY 90 mL 4  ? insulin lispro (HUMALOG) 100 UNIT/ML KwikPen Inject 35 Units subcutaneously 4 times a day 90 mL 4  ? Insulin Pen Needle 31G X 5 MM MISC Use as directed 150 each 4  ? lisinopril (ZESTRIL) 10 MG tablet TAKE 1 TABLET BY MOUTH EVERY DAY 90 tablet 3  ? Multiple Vitamins-Minerals (MULTIVITAMIN WITH MINERALS) tablet Take 1 tablet by mouth daily.    ? rosuvastatin (CRESTOR) 40 MG tablet Take 1 tablet (40 mg total) by mouth daily. 90 tablet 3  ? SYRINGE-NEEDLE, DISP, 3 ML (B-D 3CC LUER-LOK SYR 25GX1") 25G X 1" 3 ML MISC Use to inject B12 as directed 10 each 0  ? tirzepatide (MOUNJARO) 15 MG/0.5ML Pen Inject 15 mg into the skin once a week. 2 mL 0  ? Vitamin D, Ergocalciferol, (DRISDOL) 1.25 MG (50000 UNIT) CAPS capsule Take 1 capsule by mouth 2 times a week on Sunday & Wednesday 8 capsule 0  ? chlorthalidone (HYGROTON) 25 MG tablet Take 0.5 tablets (12.5 mg total) by mouth daily. 45 tablet 3  ? propranolol (INDERAL) 20 MG tablet Take 1 tablet by mouth 3 times daily. 90 tablet 0  ? mupirocin ointment (BACTROBAN) 2 % Apply topically twice daily (Patient not taking: Reported on 08/19/2021) 22 g 2  ? ?No facility-administered medications prior to visit.  ?  ? ?Allergies:   Famotidine, Prilosec [omeprazole], and Odefsey [emtricitabine-rilpivirine-tenofovir af]  ? ?Social History  ? ?Socioeconomic History  ? Marital status: Single  ?  Spouse name: Not on file  ? Number of children: 0  ? Years of education: Not on file  ? Highest education level: Not on file  ?Occupational History  ? 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  ?Tobacco Use  ? Smoking status: Never  ? Smokeless tobacco: Never  ?Vaping Use  ?  Vaping Use: Never used  ?Substance and Sexual Activity  ? Alcohol use: Not Currently  ?  Alcohol/week: 0.0 - 1.0 standard drinks  ?  Comment: Social drinker, vodka, once a month  ? Drug use: No  ? Sexual activity: Not Currently  ?  Partners: Male  ?  Comment: declined condoms  ?Other Topics Concern  ? Not on file  ?Social History Narrative  ? Not on file  ? ?Social Determinants of Health  ? ?Financial Resource Strain: Not on file  ?Food Insecurity: Not on file  ?Transportation Needs:  Not on file  ?Physical Activity: Not on file  ?Stress: Not on file  ?Social Connections: Not on file  ?  ? ?Family History:  The patient's family history includes Alcoholism in his father; Anemia in an other family member; CAD in his father and sister; Diabetes in his sister and another family member; Heart attack in his father; Hyperlipidemia in his mother; Hypertension in his mother.  ? ?Review of Systems:   ? ?Please see the history of present illness.    ? ?All other systems reviewed and are otherwise negative except as noted above. ? ? ?Physical Exam:   ? ?VS:  BP 126/78   Pulse 77   Ht 5' 6"  (1.676 m)   Wt 183 lb 3.2 oz (83.1 kg)   SpO2 96%   BMI 29.57 kg/m?    ?General: Well developed, well nourished,male appearing in no acute distress. ?Head: Normocephalic, atraumatic. ?Neck: No carotid bruits. JVD not elevated.  ?Lungs: Respirations regular and unlabored, without wheezes or rales.  ?Heart: Regular rate and rhythm. No S3 or S4.  No murmur, no rubs, or gallops appreciated. ?Abdomen: Appears non-distended. No obvious abdominal masses. ?Msk:  Strength and tone appear normal for age. No obvious joint deformities or effusions. ?Extremities: No clubbing or cyanosis. No pitting edema.  Distal pedal pulses are 2+ bilaterally. ?Neuro: Alert and oriented X 3. Moves all extremities spontaneously. No focal deficits noted. ?Psych:  Responds to questions appropriately with a normal affect. ?Skin: No rashes or lesions noted ? ?Wt Readings  from Last 3 Encounters:  ?08/19/21 183 lb 3.2 oz (83.1 kg)  ?08/18/21 177 lb (80.3 kg)  ?07/10/21 175 lb (79.4 kg)  ?  ? ?Studies/Labs Reviewed:  ? ?EKG:  EKG is not ordered today.  ? ?Recent Labs: ?11/28/

## 2021-08-19 NOTE — Patient Instructions (Signed)
Medication Instructions:  ? ?Continue current medication regimen.  ? ?*If you need a refill on your cardiac medications before your next appointment, please call your pharmacy* ? ? ?Lab Work: ? ?None ? ? ?Follow-Up: ?At [facility], you and your health needs are our priority.  As part of our continuing mission to provide you with exceptional heart care, we have created designated Provider Care Teams.  These Care Teams include your primary Cardiologist (physician) and Advanced Practice Providers (APPs -  Physician Assistants and Nurse Practitioners) who all work together to provide you with the care you need, when you need it. ? ?We recommend signing up for the patient portal called "MyChart".  Sign up information is provided on this After Visit Summary.  MyChart is used to connect with patients for Virtual Visits (Telemedicine).  Patients are able to view lab/test results, encounter notes, upcoming appointments, etc.  Non-urgent messages can be sent to your provider as well.   ?To learn more about what you can do with MyChart, go to [email or web address].   ? ?Your next appointment:   ?6 month(s) ? ?The format for your next appointment:   ?In Person ? ?Provider:   ?You may see Rozann Lesches, MD or one of the following Advanced Practice Providers on your designated Care Team:   ?Bernerd Pho, PA-C  ?Ermalinda Barrios, PA-C { ? ? ?

## 2021-08-20 ENCOUNTER — Encounter: Payer: Self-pay | Admitting: Pharmacist

## 2021-08-20 ENCOUNTER — Other Ambulatory Visit (HOSPITAL_COMMUNITY): Payer: Self-pay

## 2021-08-20 LAB — HIV-1 RNA QUANT-NO REFLEX-BLD
HIV 1 RNA Quant: 31 Copies/mL — ABNORMAL HIGH
HIV-1 RNA Quant, Log: 1.49 Log cps/mL — ABNORMAL HIGH

## 2021-08-20 LAB — HEPATITIS B SURFACE ANTIGEN: Hepatitis B Surface Ag: NONREACTIVE

## 2021-08-20 LAB — HEPATITIS A ANTIBODY, TOTAL: Hepatitis A AB,Total: NONREACTIVE

## 2021-08-20 LAB — HEPATITIS B SURFACE ANTIBODY,QUALITATIVE: Hep B S Ab: REACTIVE — AB

## 2021-08-20 LAB — RPR: RPR Ser Ql: NONREACTIVE

## 2021-08-21 ENCOUNTER — Other Ambulatory Visit (HOSPITAL_COMMUNITY): Payer: Self-pay

## 2021-08-22 ENCOUNTER — Other Ambulatory Visit (HOSPITAL_COMMUNITY): Payer: Self-pay

## 2021-08-22 MED ORDER — INSULIN DEGLUDEC 100 UNIT/ML ~~LOC~~ SOPN
PEN_INJECTOR | SUBCUTANEOUS | 12 refills | Status: DC
  Filled 2021-08-22: qty 30, 33d supply, fill #0
  Filled 2021-11-09: qty 30, 33d supply, fill #1
  Filled 2021-12-14: qty 30, 33d supply, fill #2
  Filled 2022-01-18: qty 30, 33d supply, fill #3
  Filled 2022-02-18: qty 30, 33d supply, fill #4
  Filled 2022-04-09: qty 30, 33d supply, fill #5
  Filled 2022-05-06: qty 30, 33d supply, fill #6
  Filled 2022-06-08 – 2022-07-29 (×3): qty 30, 33d supply, fill #7

## 2021-08-22 NOTE — Progress Notes (Signed)
Chief Complaint:   OBESITY [name] is here to discuss his progress with his obesity treatment plan along with follow-up of his obesity related diagnoses. See Medical Weight Management Flowsheet for complete bioelectrical impedance results.  Today's visit was #: 58 Starting weight: 199 lbs Starting date: 02/23/2018 Weight change since last visit: +2 lbs Total lbs lost to date: 22 lbs Total weight loss percentage to date: -11.06%  Nutrition Plan: A Liquid Diet for 80-90% of the time. Activity: Increased walking. Anti-obesity medications: Medication: Basaglar, Humalog, Mounjaro 15 mg subcutaneously weekly. Reported side effects: None..  Interim History: FBS 113, 159.  She says that B12 is helping with energy.  He is asking for a refill of propranolol today as it helps with anxiety.  He is still on a liquid diet due to dental issues - focusing on high protein soups.  Assessment/Plan:   1. Type 2 diabetes mellitus with other specified complication, with long-term current use of insulin (HCC) Diabetes Mellitus: Not at goal. Medication: Tresiba 45 units twice daily, Basaglar, Humalog, Mounjaro 15 mg subcutaneously weekly.   Plan: The patient will continue to focus on protein-rich, low simple carbohydrate foods. We reviewed the importance of hydration, regular exercise for stress reduction, and restorative sleep.   Lab Results  Component Value Date   HGBA1C 9.1 (H) 04/21/2021   HGBA1C 9.2 (H) 09/23/2020   HGBA1C 8.8 (H) 01/23/2020   Lab Results  Component Value Date   MICROALBUR 1.08 12/20/2013   LDLCALC 73 04/21/2021   CREATININE 0.78 05/30/2021   2. B12 deficiency Supplementation: Monthly vitamin B12 injections.  Plan:  The current medical regimen is effective;  continue present plan and medications.  Lab Results  Component Value Date   VITAMINB12 273 04/21/2021   3. [name] is currently on a liquid diet. This issue directly impacts care plan for optimization of  BMI and metabolic health as it impacts the patient's ability to make lifestyle changes. We will continue to monitor symptoms as they relate to his weight loss journey.  4. Obesity, current BMI is 28.6  Course: [name] is currently in the action stage of change. As such, his goal is to continue with weight loss efforts.   Nutrition goals: He has agreed to a Liquid Diet.   Exercise goals:  As is.  Behavioral modification strategies: increasing lean protein intake, decreasing simple carbohydrates, and increasing vegetables.  [name] has agreed to follow-up with our clinic in 4 weeks. He was informed of the importance of frequent follow-up visits to maximize his success with intensive lifestyle modifications for his multiple health conditions.   Objective:   Blood pressure 136/83, pulse (!) 112, temperature 97.6 F (36.4 C), temperature source Oral, height '5\' 6"'$  (1.676 m), weight 177 lb (80.3 kg), SpO2 95 %. Body mass index is 28.57 kg/m.  General: Cooperative, alert, well developed, in no acute distress. HEENT: Conjunctivae and lids unremarkable. Cardiovascular: Regular rhythm.  Lungs: Normal work of breathing. Neurologic: No focal deficits.   Lab Results  Component Value Date   CREATININE 0.78 05/30/2021   BUN 23 (H) 05/30/2021   NA 145 05/30/2021   K 4.0 05/30/2021   CL 110 05/30/2021   CO2 26 05/30/2021   Lab Results  Component Value Date   ALT 16 05/29/2021   AST 15 05/29/2021   ALKPHOS 65 05/29/2021   BILITOT 0.6 05/29/2021   Lab Results  Component Value Date   HGBA1C 9.1 (H) 04/21/2021   HGBA1C 9.2 (H) 09/23/2020  HGBA1C 8.8 (H) 01/23/2020   HGBA1C 8.0 (H) 09/26/2019   HGBA1C 9.6 (H) 06/27/2019   Lab Results  Component Value Date   INSULIN 2.2 (L) 09/23/2020   Lab Results  Component Value Date   TSH 1.620 04/21/2021   Lab Results  Component Value Date   CHOL 138 04/21/2021   HDL 38 (L) 04/21/2021   LDLCALC 73 04/21/2021   TRIG 156 (H) 04/21/2021    CHOLHDL 3.6 04/21/2021   Lab Results  Component Value Date   VD25OH 49.9 04/21/2021   VD25OH 27.4 (L) 09/23/2020   VD25OH 24.8 (L) 01/23/2020   Lab Results  Component Value Date   WBC 6.9 05/30/2021   HGB 12.9 (L) 05/30/2021   HCT 39.4 05/30/2021   MCV 87.0 05/30/2021   PLT 246 05/30/2021   Attestation Statements:   Reviewed by clinician on day of visit: allergies, medications, problem list, medical history, surgical history, family history, social history, and previous encounter notes.  I, Water quality scientist, CMA, am acting as transcriptionist for Briscoe Deutscher, DO  I have reviewed the above documentation for accuracy and completeness, and I agree with the above. -  Briscoe Deutscher, DO, MS, FAAFP, DABOM - Family and Bariatric Medicine.

## 2021-08-25 ENCOUNTER — Other Ambulatory Visit (INDEPENDENT_AMBULATORY_CARE_PROVIDER_SITE_OTHER): Payer: Self-pay | Admitting: Family Medicine

## 2021-08-25 DIAGNOSIS — E1169 Type 2 diabetes mellitus with other specified complication: Secondary | ICD-10-CM

## 2021-08-26 ENCOUNTER — Other Ambulatory Visit (HOSPITAL_COMMUNITY): Payer: Self-pay

## 2021-08-27 ENCOUNTER — Other Ambulatory Visit (HOSPITAL_COMMUNITY): Payer: Self-pay

## 2021-09-02 ENCOUNTER — Other Ambulatory Visit (INDEPENDENT_AMBULATORY_CARE_PROVIDER_SITE_OTHER): Payer: Self-pay | Admitting: Family Medicine

## 2021-09-02 DIAGNOSIS — Z794 Long term (current) use of insulin: Secondary | ICD-10-CM

## 2021-09-04 ENCOUNTER — Other Ambulatory Visit (HOSPITAL_COMMUNITY): Payer: Self-pay

## 2021-09-08 ENCOUNTER — Ambulatory Visit (INDEPENDENT_AMBULATORY_CARE_PROVIDER_SITE_OTHER): Payer: 59 | Admitting: Physician Assistant

## 2021-09-08 ENCOUNTER — Encounter (INDEPENDENT_AMBULATORY_CARE_PROVIDER_SITE_OTHER): Payer: Self-pay | Admitting: Physician Assistant

## 2021-10-07 ENCOUNTER — Ambulatory Visit (INDEPENDENT_AMBULATORY_CARE_PROVIDER_SITE_OTHER): Payer: 59 | Admitting: Physician Assistant

## 2021-10-07 ENCOUNTER — Encounter (INDEPENDENT_AMBULATORY_CARE_PROVIDER_SITE_OTHER): Payer: Self-pay

## 2021-10-08 ENCOUNTER — Other Ambulatory Visit (HOSPITAL_COMMUNITY): Payer: Self-pay

## 2021-10-09 ENCOUNTER — Other Ambulatory Visit (HOSPITAL_COMMUNITY): Payer: Self-pay

## 2021-10-10 ENCOUNTER — Telehealth: Payer: Self-pay

## 2021-10-10 NOTE — Telephone Encounter (Signed)
[facility] Patient Advocate Encounter  Patient's medication [name]) have been couriered to [facility] from [facility] and will be administered on the patient next office visit on 10/21/21.  [name] , [facility] Patient [facility] Phone: [phone] Fax:  [phone]

## 2021-10-13 ENCOUNTER — Encounter: Payer: 59 | Admitting: Infectious Disease

## 2021-10-14 ENCOUNTER — Encounter: Payer: 59 | Admitting: Infectious Disease

## 2021-10-14 ENCOUNTER — Other Ambulatory Visit (HOSPITAL_COMMUNITY): Payer: Self-pay

## 2021-10-14 MED ORDER — INSULIN LISPRO (1 UNIT DIAL) 100 UNIT/ML (KWIKPEN)
PEN_INJECTOR | SUBCUTANEOUS | 4 refills | Status: DC
  Filled 2021-10-14: qty 90, 64d supply, fill #0
  Filled 2022-03-11: qty 90, 84d supply, fill #1
  Filled 2022-06-05: qty 90, 84d supply, fill #2
  Filled 2022-08-30: qty 90, 84d supply, fill #3

## 2021-10-14 MED ORDER — TRESIBA FLEXTOUCH 100 UNIT/ML ~~LOC~~ SOPN
PEN_INJECTOR | SUBCUTANEOUS | 12 refills | Status: DC
Start: 2021-10-14 — End: 2023-01-26
  Filled 2021-10-14: qty 30, 30d supply, fill #0
  Filled 2022-08-30: qty 30, 30d supply, fill #1
  Filled 2022-09-30: qty 30, 30d supply, fill #2

## 2021-10-15 ENCOUNTER — Other Ambulatory Visit (HOSPITAL_COMMUNITY): Payer: Self-pay

## 2021-10-21 ENCOUNTER — Ambulatory Visit (INDEPENDENT_AMBULATORY_CARE_PROVIDER_SITE_OTHER): Payer: 59 | Admitting: Infectious Disease

## 2021-10-21 ENCOUNTER — Other Ambulatory Visit: Payer: Self-pay

## 2021-10-21 ENCOUNTER — Encounter: Payer: Self-pay | Admitting: Infectious Disease

## 2021-10-21 VITALS — BP 178/108 | HR 89 | Temp 98.3°F | Ht 66.0 in | Wt 194.0 lb

## 2021-10-21 DIAGNOSIS — I2510 Atherosclerotic heart disease of native coronary artery without angina pectoris: Secondary | ICD-10-CM | POA: Diagnosis not present

## 2021-10-21 DIAGNOSIS — Z6830 Body mass index (BMI) 30.0-30.9, adult: Secondary | ICD-10-CM

## 2021-10-21 DIAGNOSIS — E669 Obesity, unspecified: Secondary | ICD-10-CM

## 2021-10-21 DIAGNOSIS — Z7185 Encounter for immunization safety counseling: Secondary | ICD-10-CM

## 2021-10-21 DIAGNOSIS — B20 Human immunodeficiency virus [HIV] disease: Secondary | ICD-10-CM

## 2021-10-21 DIAGNOSIS — E1110 Type 2 diabetes mellitus with ketoacidosis without coma: Secondary | ICD-10-CM

## 2021-10-21 DIAGNOSIS — I2583 Coronary atherosclerosis due to lipid rich plaque: Secondary | ICD-10-CM

## 2021-10-21 DIAGNOSIS — I10 Essential (primary) hypertension: Secondary | ICD-10-CM | POA: Diagnosis not present

## 2021-10-21 DIAGNOSIS — E7800 Pure hypercholesterolemia, unspecified: Secondary | ICD-10-CM

## 2021-10-21 MED ORDER — CABOTEGRAVIR & RILPIVIRINE ER 600 & 900 MG/3ML IM SUER
1.0000 | Freq: Once | INTRAMUSCULAR | Status: AC
  Administered 2021-10-21: 1 via INTRAMUSCULAR

## 2021-10-21 NOTE — Progress Notes (Signed)
Subjective:  Chief complaint follow-up for HIV disease  Patient ID: [name], male    DOB: 1965-11-01, 57 y.o.   MRN: [id number]  HPI  [name] is a 56 year old Caucasian man who has HIV infection that has been peripheral controlled on [place] and now [place].  He does have comorbid insulin-dependent diabetes mellitus coronary artery disease hyperlipidemia and obesity.  Also has hypertension and was hypertensive on arrival today in the clinic which he attributes to having to rush around quite a bit.  He says that he had quite a bit of pain with the initial Cabenuva injections but really has hardly noticed any and pain in the ensuing injections.  He prefers [place] to oral antiretroviral therapy.    Past Medical History:  Diagnosis Date   CAD (coronary artery disease)    DES to anomalous circumflex 2007; DES due to ISR anomalous circumflex 2009   Diabetes mellitus type II    Essential hypertension    History of heart attack    History of kidney stones    HIV (human immunodeficiency virus infection) ([place])    Diagnosed 2002   LFTs abnormal    Mixed hyperlipidemia    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   Syphilitic hepatitis    Type 2 diabetes mellitus [facility])     Past Surgical History:  Procedure Laterality Date   BIOPSY  12/28/2019   Procedure: BIOPSY;  Surgeon: Rogene Houston, MD;  [facility];  Service: Endoscopy;;   CHOLECYSTECTOMY OPEN  07/2001   COLONOSCOPY N/A 05/28/2017   Procedure: COLONOSCOPY;  Surgeon: Rogene Houston, MD;  [facility];  Service: Endoscopy;  Laterality: N/A;  200 - pt to prep in Endo   CORONARY ANGIOPLASTY WITH STENT PLACEMENT  03/2006; 10/2007;    ESOPHAGEAL DILATION N/A 12/28/2019   Procedure: ESOPHAGEAL DILATION;  Surgeon: Rogene Houston, MD;  [facility];  Service: Endoscopy;  Laterality: N/A;    ESOPHAGOGASTRODUODENOSCOPY N/A 12/28/2019   Procedure: ESOPHAGOGASTRODUODENOSCOPY (EGD);  Surgeon: Rogene Houston, MD;  [facility];  Service: Endoscopy;  Laterality: N/A;  125, per Lelon Frohlich moved to 10:55 pt aware   [place] / [place] / [place]  07/2001   UHR   POLYPECTOMY  05/28/2017   Procedure: POLYPECTOMY;  Surgeon: Rogene Houston, MD;  [facility];  Service: Endoscopy;;  colon    Family History  Problem Relation Age of Onset   Heart attack Father        7 heart attacks   CAD Father    Alcoholism Father    Hyperlipidemia Mother    Hypertension Mother    Anemia Other    Diabetes Other    CAD Sister    Diabetes Sister       Social History   Socioeconomic History   Marital status: Single    Spouse name: Not on file   Number of children: 0   Years of education: Not on file   Highest education level: Not on file  Occupational History   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  Tobacco Use   Smoking status: Never   Smokeless tobacco: Never  Vaping Use   Vaping Use: Never used  Substance and Sexual Activity   Alcohol use: Not Currently    Alcohol/week: 0.0 - 1.0 standard drinks  Comment: Social drinker, vodka, once a month   Drug use: No   Sexual activity: Not Currently    Partners: Male    Comment: declined condoms  Other Topics Concern   Not on file  Social History Narrative   Not on file   Social Determinants of Health   Financial Resource Strain: Not on file  Food Insecurity: Not on file  Transportation Needs: Not on file  Physical Activity: Not on file  Stress: Not on file  Social Connections: Not on file    Allergies  Allergen Reactions   Famotidine Other (See Comments)    Contraindicated with ODEFSEY Other reaction(s): Other (See Comments) Contraindicated with ODEFSEY   Prilosec [Omeprazole] Other (See Comments)    Contraindicated with RPV  (lowers level of this ARV in ODEFSEY   Odefsey [Emtricitabine-Rilpivirine-Tenofovir Af]     Makes blood sugars run high     Current Outpatient Medications:    aspirin 81 MG tablet, Take 1 tablet (81 mg total) by mouth daily with breakfast., Disp: 30 tablet, Rfl: 3   cabotegravir & rilpivirine ER (CABENUVA) 600 & 900 MG/3ML injection, Inject 1 kit into the muscle every 2 (two) months., Disp: 6 mL, Rfl: 5   chlorthalidone (HYGROTON) 25 MG tablet, Take 1 tablet (25 mg total) by mouth daily., Disp: 90 tablet, Rfl: 3   Continuous Blood Gluc Sensor (FREESTYLE LIBRE 14 DAY SENSOR) MISC, Use as directed, Disp: 2 each, Rfl: 12   cyanocobalamin (,VITAMIN B-12,) 1000 MCG/ML injection, Inject 1 mL into the skin once weekly for 4 weeks. Then inject monthly as directed., Disp: 12 mL, Rfl: 2   escitalopram (LEXAPRO) 10 MG tablet, Take 1 tablet (10 mg total) by mouth daily., Disp: 90 tablet, Rfl: 0   insulin degludec (TRESIBA FLEXTOUCH) 100 UNIT/ML FlexTouch Pen, INJECT 45 UNITS UNDER THE SKIN TWICE DAILY, Disp: 30 mL, Rfl: 12   insulin degludec (TRESIBA) 100 UNIT/ML FlexTouch Pen, Inject 45 units into the skin twice daily., Disp: 30 mL, Rfl: 12   Insulin Glargine (BASAGLAR KWIKPEN) 100 UNIT/ML, Inject 45 Units into the skin 2 (two) times daily., Disp: , Rfl:    insulin lispro (HUMALOG KWIKPEN) 100 UNIT/ML KwikPen, INJECT 35 UNITS 4 TIMES DAILY, Disp: 90 mL, Rfl: 4   insulin lispro (HUMALOG KWIKPEN) 100 UNIT/ML KwikPen, INJECT 35 UNITS 4 TIMES DAILY, Disp: 90 mL, Rfl: 4   insulin lispro (HUMALOG) 100 UNIT/ML KwikPen, Inject 35 Units subcutaneously 4 times a day, Disp: 90 mL, Rfl: 4   Insulin Pen Needle 31G X 5 MM MISC, Use as directed, Disp: 150 each, Rfl: 4   lisinopril (ZESTRIL) 10 MG tablet, TAKE 1 TABLET BY MOUTH EVERY DAY, Disp: 90 tablet, Rfl: 3   Multiple Vitamins-Minerals (MULTIVITAMIN WITH MINERALS) tablet, Take 1 tablet by mouth daily., Disp: , Rfl:    propranolol (INDERAL) 20 MG tablet, Take 1  tablet (20 mg total) by mouth 2 (two) times daily., Disp: 180 tablet, Rfl: 3   rosuvastatin (CRESTOR) 40 MG tablet, Take 1 tablet (40 mg total) by mouth daily., Disp: 90 tablet, Rfl: 3   SYRINGE-NEEDLE, DISP, 3 ML (B-D 3CC LUER-LOK SYR 25GX1") 25G X 1" 3 ML MISC, Use to inject B12 as directed, Disp: 10 each, Rfl: 0   Vitamin D, Ergocalciferol, (DRISDOL) 1.25 MG (50000 UNIT) CAPS capsule, Take 1 capsule by mouth 2 times a week on Sunday & Wednesday, Disp: 8 capsule, Rfl: 0   ezetimibe (ZETIA) 10 MG tablet, Take 1 tablet (10 mg total)  by mouth daily., Disp: 90 tablet, Rfl: 3   mupirocin ointment (BACTROBAN) 2 %, Apply topically twice daily (Patient not taking: Reported on 08/19/2021), Disp: 22 g, Rfl: 2   tirzepatide (MOUNJARO) 15 MG/0.5ML Pen, Inject 15 mg into the skin once a week. (Patient not taking: Reported on 10/21/2021), Disp: 2 mL, Rfl: 0   Review of Systems  Constitutional:  Negative for activity change, appetite change, chills, diaphoresis, fatigue, fever and unexpected weight change.  HENT:  Negative for congestion, rhinorrhea, sinus pressure, sneezing, sore throat and trouble swallowing.   Eyes:  Negative for photophobia and visual disturbance.  Respiratory:  Negative for cough, chest tightness, shortness of breath, wheezing and stridor.   Cardiovascular:  Negative for chest pain, palpitations and leg swelling.  Gastrointestinal:  Negative for abdominal distention, abdominal pain, anal bleeding, blood in stool, constipation, diarrhea, nausea and vomiting.  Genitourinary:  Negative for difficulty urinating, dysuria, flank pain and hematuria.  Musculoskeletal:  Negative for arthralgias, back pain, gait problem, joint swelling and myalgias.  Skin:  Negative for color change, pallor, rash and wound.  Neurological:  Negative for dizziness, tremors, weakness and light-headedness.  Hematological:  Negative for adenopathy. Does not bruise/bleed easily.  Psychiatric/Behavioral:  Negative for  agitation, behavioral problems, confusion, decreased concentration, dysphoric mood and sleep disturbance.       Objective:   Physical Exam Constitutional:      Appearance: He is well-developed.  HENT:     Head: Normocephalic and atraumatic.  Eyes:     Conjunctiva/sclera: Conjunctivae normal.  Cardiovascular:     Rate and Rhythm: Normal rate and regular rhythm.  Pulmonary:     Effort: Pulmonary effort is normal. No respiratory distress.     Breath sounds: No wheezing.  Abdominal:     General: There is no distension.     Palpations: Abdomen is soft.  Musculoskeletal:        General: No tenderness. Normal range of motion.     Cervical back: Normal range of motion and neck supple.  Skin:    General: Skin is warm and dry.     Coloration: Skin is not pale.     Findings: No erythema or rash.  Neurological:     General: No focal deficit present.     Mental Status: He is alert and oriented to person, place, and time.  Psychiatric:        Mood and Affect: Mood normal.        Behavior: Behavior normal.        Thought Content: Thought content normal.        Judgment: Judgment normal.          Assessment & Plan:   HIV disease  I am checking a viral load with reflex to genotype  We are giving Cabenuva injection and he will resume every 2 month injections in July  Coronary disease on beta-blocker ACE inhibitor statin  Hyperlipidemia on statin  Insulin-dependent diabetes on insulin follows with Asencion Noble  Seen counseling he is eligible for Shingrix but I am not sure if we have it here it not time yet for Prevnar 20  Obesity: trying to get on weight loss drug

## 2021-10-26 LAB — HIV RNA, RTPCR W/R GT (RTI, PI,INT)
HIV 1 RNA Quant: NOT DETECTED copies/mL
HIV-1 RNA Quant, Log: NOT DETECTED Log copies/mL

## 2021-11-09 ENCOUNTER — Other Ambulatory Visit (INDEPENDENT_AMBULATORY_CARE_PROVIDER_SITE_OTHER): Payer: Self-pay

## 2021-11-09 ENCOUNTER — Other Ambulatory Visit (HOSPITAL_COMMUNITY): Payer: Self-pay

## 2021-11-09 ENCOUNTER — Other Ambulatory Visit: Payer: Self-pay | Admitting: Cardiology

## 2021-11-10 ENCOUNTER — Other Ambulatory Visit (HOSPITAL_COMMUNITY): Payer: Self-pay

## 2021-11-10 MED ORDER — ROSUVASTATIN CALCIUM 40 MG PO TABS
40.0000 mg | ORAL_TABLET | Freq: Every day | ORAL | 3 refills | Status: DC
  Filled 2021-11-10: qty 90, 90d supply, fill #0
  Filled 2022-02-01: qty 90, 90d supply, fill #1
  Filled 2022-05-06: qty 90, 90d supply, fill #2
  Filled 2022-07-31: qty 90, 90d supply, fill #3

## 2021-11-10 MED ORDER — INSULIN LISPRO (1 UNIT DIAL) 100 UNIT/ML (KWIKPEN)
PEN_INJECTOR | SUBCUTANEOUS | 4 refills | Status: DC
  Filled 2021-11-10: qty 90, 64d supply, fill #0

## 2021-11-10 MED ORDER — LISINOPRIL 10 MG PO TABS
ORAL_TABLET | Freq: Every day | ORAL | 3 refills | Status: DC
  Filled 2021-11-10: qty 90, 90d supply, fill #0
  Filled 2022-02-01: qty 90, 90d supply, fill #1
  Filled 2022-05-06: qty 90, 90d supply, fill #2
  Filled 2022-07-31: qty 90, 90d supply, fill #3

## 2021-11-10 MED ORDER — UNIFINE PENTIPS 31G X 5 MM MISC
4 refills | Status: AC
  Filled 2021-11-10: qty 100, 30d supply, fill #0
  Filled 2021-12-05: qty 100, 30d supply, fill #1
  Filled 2022-01-18: qty 100, 30d supply, fill #2
  Filled 2022-03-11: qty 100, 30d supply, fill #3
  Filled 2022-04-09: qty 100, 30d supply, fill #4
  Filled 2022-05-06: qty 100, 30d supply, fill #5
  Filled 2022-08-30: qty 100, 30d supply, fill #6
  Filled 2022-10-01 – 2022-10-06 (×2): qty 100, 30d supply, fill #7

## 2021-11-11 ENCOUNTER — Other Ambulatory Visit (HOSPITAL_COMMUNITY): Payer: Self-pay

## 2021-11-12 ENCOUNTER — Other Ambulatory Visit (HOSPITAL_COMMUNITY): Payer: Self-pay

## 2021-11-12 MED ORDER — FREESTYLE LIBRE 14 DAY SENSOR MISC
12 refills | Status: DC
Start: 2021-11-12 — End: 2022-12-03
  Filled 2021-11-12: qty 2, fill #0
  Filled 2021-12-05 – 2022-01-18 (×5): qty 2, 28d supply, fill #0
  Filled 2022-02-12 – 2022-02-16 (×2): qty 2, 28d supply, fill #1
  Filled 2022-03-11 – 2022-03-20 (×3): qty 2, 28d supply, fill #2
  Filled 2022-04-13 – 2022-04-16 (×2): qty 2, 28d supply, fill #3
  Filled 2022-06-05: qty 2, 28d supply, fill #4
  Filled 2022-07-01: qty 2, 28d supply, fill #5
  Filled 2022-07-29: qty 2, 28d supply, fill #6
  Filled 2022-08-30: qty 2, 28d supply, fill #7
  Filled 2022-10-01: qty 2, 28d supply, fill #8
  Filled 2022-10-28: qty 2, 28d supply, fill #9

## 2021-11-17 ENCOUNTER — Other Ambulatory Visit (HOSPITAL_COMMUNITY): Payer: Self-pay

## 2021-12-01 ENCOUNTER — Other Ambulatory Visit (HOSPITAL_COMMUNITY): Payer: Self-pay

## 2021-12-02 ENCOUNTER — Other Ambulatory Visit (HOSPITAL_COMMUNITY): Payer: Self-pay

## 2021-12-04 ENCOUNTER — Other Ambulatory Visit (HOSPITAL_COMMUNITY): Payer: Self-pay

## 2021-12-05 ENCOUNTER — Other Ambulatory Visit (HOSPITAL_COMMUNITY): Payer: Self-pay

## 2021-12-05 ENCOUNTER — Telehealth: Payer: Self-pay

## 2021-12-05 NOTE — Telephone Encounter (Signed)
[facility] Patient Advocate Encounter  Patient's medication [name]) have been couriered to [facility] from [facility] and will be administered on the patient next office visit on 12/08/21.  [name] , [facility] Patient [facility] Phone: [phone] Fax:  [phone]

## 2021-12-08 ENCOUNTER — Ambulatory Visit (INDEPENDENT_AMBULATORY_CARE_PROVIDER_SITE_OTHER): Payer: 59 | Admitting: Nurse Practitioner

## 2021-12-08 ENCOUNTER — Encounter (INDEPENDENT_AMBULATORY_CARE_PROVIDER_SITE_OTHER): Payer: Self-pay | Admitting: Nurse Practitioner

## 2021-12-08 VITALS — BP 176/90 | HR 107 | Temp 98.3°F | Ht 66.0 in | Wt 192.0 lb

## 2021-12-08 DIAGNOSIS — Z794 Long term (current) use of insulin: Secondary | ICD-10-CM | POA: Diagnosis not present

## 2021-12-08 DIAGNOSIS — E669 Obesity, unspecified: Secondary | ICD-10-CM | POA: Diagnosis not present

## 2021-12-08 DIAGNOSIS — E1169 Type 2 diabetes mellitus with other specified complication: Secondary | ICD-10-CM | POA: Diagnosis not present

## 2021-12-08 DIAGNOSIS — Z6831 Body mass index (BMI) 31.0-31.9, adult: Secondary | ICD-10-CM | POA: Diagnosis not present

## 2021-12-09 NOTE — Progress Notes (Unsigned)
Chief Complaint:   OBESITY [name] is here to discuss his progress with his obesity treatment plan along with follow-up of his obesity related diagnoses. [name] is on the Category 3 Plan and states he is following his eating plan approximately 30% of the time. [name] states he is exercising 0 minutes 0 times per week.  Today's visit was #: 4 Starting weight: 199 lbs Starting date: 02/23/2018 Today's weight: 192 lbs Today's date: 12/08/2021 Total lbs lost to date: 7 lbs Total lbs lost since last in-office visit: 0  Interim History: [name] is under a lot of stress since March. Since having 13 teeth removed in Jan, he's still struggling with eating food. Because of that he is not able to follow the plan. He is eating soft foods like mashed potatoes. Breakfast: eggs, sausage at work 3 days per week, eggs, sausage, grits or oatmeal. Lunch: soup. Dinner: soup. He has been drinking a protein shake, diet Coke, limited water. He's eating more carbs then protein. He does not like yogurt, cottage cheese,or tuna.  Subjective:   1. Type 2 diabetes mellitus with other specified complication, with long-term current use of insulin (HCC) [name] is taking Humulog 11 units, B, L and dinner and sliding scale and Glargine 45 units twice a day. Managed by PCP. He is on ACE and statin. Took Mounjaro, Metformin and Ozempic in the past. Fasting blood sugars 70-150. Notes some shaky and feeling clammy when his blood sugars are in the 70s. Notes rarly in the 72s. His last eye exam was 1/23.  Assessment/Plan:   1. Type 2 diabetes mellitus with other specified complication, with long-term current use of insulin (HCC) [name] will continue to follow up with PCP. Continue medications as directed.  Good blood sugar control is important to decrease the likelihood of diabetic complications such as nephropathy, neuropathy, limb loss, blindness, coronary artery disease, and death. Intensive lifestyle modification including  diet, exercise and weight loss are the first line of treatment for diabetes.    2. Obesity, current BMI is 31.1 [name] is currently in the action stage of change. As such, his goal is to continue with weight loss efforts. He has agreed to practicing portion control and making smarter food choices, such as increasing vegetables and decreasing simple carbohydrates. [name] doesn't like a lot of foods due to texture and struggles with eating meat at work due to problems he is having with his teeth.  I've asked him to start a protein shake.  I am concerned he is not getting in enough protein.  Discussed the importance of increasing water and protein intake.    [name] will return for fasting labs and IC.  Exercise goals: All adults should avoid inactivity. Some physical activity is better than none, and adults who participate in any amount of physical activity gain some health benefits.  Behavioral modification strategies: increasing lean protein intake, increasing water intake, planning for success, and keeping a strict food journal.  [name] has agreed to follow-up with our clinic in 2 weeks. He was informed of the importance of frequent follow-up visits to maximize his success with intensive lifestyle modifications for his multiple health conditions.   Objective:   Blood pressure (!) 176/90, pulse (!) 107, temperature 98.3 F (36.8 C), height '5\' 6"'$  (1.676 m), weight 192 lb (87.1 kg), SpO2 97 %. Body mass index is 30.99 kg/m.  General: Cooperative, alert, well developed, in no acute distress. HEENT: Conjunctivae and lids unremarkable. Cardiovascular: Regular rhythm.  Lungs: Normal  work of breathing. Neurologic: No focal deficits.   Lab Results  Component Value Date   CREATININE 0.78 05/30/2021   BUN 23 (H) 05/30/2021   NA 145 05/30/2021   K 4.0 05/30/2021   CL 110 05/30/2021   CO2 26 05/30/2021   Lab Results  Component Value Date   ALT 16 05/29/2021   AST 15 05/29/2021   ALKPHOS 65  05/29/2021   BILITOT 0.6 05/29/2021   Lab Results  Component Value Date   HGBA1C 9.1 (H) 04/21/2021   HGBA1C 9.2 (H) 09/23/2020   HGBA1C 8.8 (H) 01/23/2020   HGBA1C 8.0 (H) 09/26/2019   HGBA1C 9.6 (H) 06/27/2019   Lab Results  Component Value Date   INSULIN 2.2 (L) 09/23/2020   Lab Results  Component Value Date   TSH 1.620 04/21/2021   Lab Results  Component Value Date   CHOL 138 04/21/2021   HDL 38 (L) 04/21/2021   LDLCALC 73 04/21/2021   TRIG 156 (H) 04/21/2021   CHOLHDL 3.6 04/21/2021   Lab Results  Component Value Date   VD25OH 49.9 04/21/2021   VD25OH 27.4 (L) 09/23/2020   VD25OH 24.8 (L) 01/23/2020   Lab Results  Component Value Date   WBC 6.9 05/30/2021   HGB 12.9 (L) 05/30/2021   HCT 39.4 05/30/2021   MCV 87.0 05/30/2021   PLT 246 05/30/2021   No results found for: "IRON", "TIBC", "FERRITIN"  Attestation Statements:   Reviewed by clinician on day of visit: allergies, medications, problem list, medical history, surgical history, family history, social history, and previous encounter notes.  I, Brendell Tyus, RMA, am acting as transcriptionist for Everardo Pacific, FNP.  I have reviewed the above documentation for accuracy and completeness, and I agree with the above. Everardo Pacific, FNP

## 2021-12-15 ENCOUNTER — Other Ambulatory Visit: Payer: Self-pay

## 2021-12-15 ENCOUNTER — Ambulatory Visit (INDEPENDENT_AMBULATORY_CARE_PROVIDER_SITE_OTHER): Payer: 59 | Admitting: Pharmacist

## 2021-12-15 ENCOUNTER — Other Ambulatory Visit (HOSPITAL_COMMUNITY): Payer: Self-pay

## 2021-12-15 ENCOUNTER — Other Ambulatory Visit (INDEPENDENT_AMBULATORY_CARE_PROVIDER_SITE_OTHER): Payer: Self-pay | Admitting: Family Medicine

## 2021-12-15 DIAGNOSIS — B20 Human immunodeficiency virus [HIV] disease: Secondary | ICD-10-CM

## 2021-12-15 DIAGNOSIS — E538 Deficiency of other specified B group vitamins: Secondary | ICD-10-CM

## 2021-12-15 DIAGNOSIS — F411 Generalized anxiety disorder: Secondary | ICD-10-CM

## 2021-12-15 MED ORDER — CABOTEGRAVIR & RILPIVIRINE ER 600 & 900 MG/3ML IM SUER
1.0000 | Freq: Once | INTRAMUSCULAR | Status: AC
  Administered 2021-12-15: 1 via INTRAMUSCULAR

## 2021-12-15 NOTE — Progress Notes (Signed)
HPI: [name] is a 56 y.o. male who presents to the [place] clinic for [place] administration.  Patient Active Problem List   Diagnosis Date Noted   Thrombocytosis 05/28/2021   DKA (diabetic ketoacidosis) ([place]) 03/05/2020   High anion gap metabolic acidosis 16/11/3708   Leukocytosis 03/05/2020   Dehydration 03/05/2020   Hyponatremia 03/05/2020   Hyperkalemia 03/05/2020   Hyperglycemia due to diabetes mellitus ([place]) 03/05/2020   Intractable nausea and vomiting 03/05/2020   DKA, type 2 ([place]) 03/05/2020   Other fatigue 03/14/2018   Shortness of breath on exertion 03/14/2018   Uncontrolled type 2 diabetes mellitus with hyperglycemia ([place]) 02/08/2018   Mixed hyperlipidemia 02/08/2018   AKI (acute kidney injury) ([place]) 03/23/2017   Special screening for malignant neoplasms, colon 11/18/2016   Family history of colon cancer 11/18/2016   Lung nodule, multiple 09/27/2014   Poorly controlled type 2 diabetes mellitus with circulatory disorder ([place]) 09/27/2014   Early syphilis, 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   CABOTEGRAVIR & RILPIVIRINE ER (CABENUVA) 600 & 900 MG/3ML INJECTION    Inject 1 kit into the muscle every 2 (two) months.   CHLORTHALIDONE (HYGROTON) 25 MG TABLET    Take 1 tablet (25 mg total) by mouth daily.   CONTINUOUS BLOOD GLUC SENSOR (FREESTYLE LIBRE 14 DAY SENSOR) MISC    Use as directed.   CYANOCOBALAMIN (,VITAMIN B-12,) 1000 MCG/ML INJECTION    Inject 1 mL into the  skin once weekly for 4 weeks. Then inject monthly as directed.   ESCITALOPRAM (LEXAPRO) 10 MG TABLET    Take 1 tablet (10 mg total) by mouth daily.   EZETIMIBE (ZETIA) 10 MG TABLET    Take 1 tablet (10 mg total) by mouth daily.   INSULIN DEGLUDEC (TRESIBA FLEXTOUCH) 100 UNIT/ML FLEXTOUCH PEN    INJECT 45 UNITS UNDER THE SKIN TWICE DAILY   INSULIN DEGLUDEC (TRESIBA) 100 UNIT/ML FLEXTOUCH PEN    Inject 45 units into the skin twice daily.   INSULIN GLARGINE (BASAGLAR KWIKPEN) 100 UNIT/ML    Inject 45 Units into the skin 2 (two) times daily.   INSULIN LISPRO (HUMALOG KWIKPEN) 100 UNIT/ML KWIKPEN    INJECT 35 UNITS 4 TIMES DAILY   INSULIN LISPRO (HUMALOG KWIKPEN) 100 UNIT/ML KWIKPEN    INJECT 35 UNITS 4 TIMES DAILY   INSULIN LISPRO (HUMALOG) 100 UNIT/ML KWIKPEN    Inject 35 units into the skin 4 times a day   INSULIN PEN NEEDLE (UNIFINE PENTIPS) 31G X 5 MM MISC    Use as directed   INSULIN PEN NEEDLE 31G X 5 MM MISC    Use as directed   LISINOPRIL (ZESTRIL) 10 MG TABLET    TAKE 1 TABLET BY MOUTH EVERY DAY   MULTIPLE VITAMINS-MINERALS (MULTIVITAMIN WITH MINERALS) TABLET    Take 1 tablet by mouth daily.   MUPIROCIN OINTMENT (BACTROBAN) 2 %    Apply topically twice daily   PROPRANOLOL (INDERAL) 20 MG TABLET  Take 1 tablet (20 mg total) by mouth 2 (two) times daily.   ROSUVASTATIN (CRESTOR) 40 MG TABLET    Take 1 tablet by mouth daily.   SYRINGE-NEEDLE, DISP, 3 ML (B-D 3CC LUER-LOK SYR 25GX1") 25G X 1" 3 ML MISC    Use to inject B12 as directed   TIRZEPATIDE (MOUNJARO) 15 MG/0.5ML PEN    Inject 15 mg into the skin once a week.   VITAMIN D, ERGOCALCIFEROL, (DRISDOL) 1.25 MG (50000 UNIT) CAPS CAPSULE    Take 1 capsule by mouth 2 times a week on Sunday & Wednesday  Modified Medications   No medications on file  Discontinued Medications   No medications on file    Allergies: Allergies  Allergen Reactions   Famotidine Other (See Comments)    Contraindicated with ODEFSEY Other reaction(s): Other  (See Comments) Contraindicated with ODEFSEY   Prilosec [Omeprazole] Other (See Comments)    Contraindicated with RPV (lowers level of this ARV in [place] [Emtricitabine-Rilpivirine-Tenofovir Af]     Makes blood sugars run high    Past Medical History: Past Medical History:  Diagnosis Date   CAD (coronary artery disease)    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  Tobacco Use   Smoking status: Never   Smokeless tobacco: Never  Vaping Use   Vaping Use: Never used  Substance and Sexual Activity   Alcohol use: Yes    Alcohol/week: 0.0 - 1.0 standard drinks of alcohol    Comment: Social drinker, vodka, once a month   Drug use: No   Sexual activity: Not Currently    Partners: Male    Comment: declined condoms  Other Topics Concern   Not on file  Social History Narrative   Not on file   Social Determinants of Health   Financial Resource Strain: Not on file  Food Insecurity: Not on file  Transportation Needs: Not on file  Physical Activity: Not on file  Stress: Not on file  Social Connections: Not on file    Labs: Lab Results  Component Value Date   HIV1RNAQUANT NOT DETECTED 10/21/2021   HIV1RNAQUANT 31 (H)  08/18/2021   HIV1RNAQUANT Not Detected 06/16/2021   CD4TABS 897 03/18/2021   CD4TABS 1,066 03/18/2020   CD4TABS 880 03/14/2019    RPR and STI Lab Results  Component Value Date   LABRPR NON-REACTIVE 08/18/2021   LABRPR NON-REACTIVE 03/18/2020   LABRPR NON-REACTIVE 03/14/2019   LABRPR NON-REACTIVE 02/22/2018   LABRPR NON REAC 11/30/2016    STI Results GC CT  08/18/2021  9:21 AM Negative    Negative    Negative  Negative  Negative    Negative   02/22/2018 12:00 AM Negative  Negative   11/30/2016 12:00 AM Negative  Negative   10/28/2015 12:00 AM Negative  Negative   09/13/2014 12:00 AM Negative  Negative     Hepatitis B Lab Results  Component Value Date   HEPBSAB REACTIVE (A) 08/18/2021   HEPBSAG NON-REACTIVE 08/18/2021   Hepatitis C No results found for: "HEPCAB", "HCVRNAPCRQN" Hepatitis A Lab Results  Component Value Date   HAV NON-REACTIVE 08/18/2021   Lipids: Lab Results  Component Value Date   CHOL 138 04/21/2021   TRIG 156 (H) 04/21/2021   HDL 38 (L) 04/21/2021   CHOLHDL 3.6 04/21/2021   VLDL 59 (H) 10/28/2015   LDLCALC 73 04/21/2021    TARGET DATE: The 25th  Assessment: [name] presents today for his maintenance Cabenuva injections. Past injections were tolerated well without issues.  Administered cabotegravir 618m/3mL in left upper outer quadrant of the gluteal muscle. Administered rilpivirine 900 mg/33min the right upper outer quadrant of the gluteal muscle. No issues with injections. He will follow up in 2 months for next set of injections.  Plan: - Cabenuva injections administered - Next injections scheduled for 02/16/22 and 04/13/22 - Call with any issues or questions  Meika Earll L. Shawnya Mayor, PharmD, BCIDP, AAHIVP, CPDripping Springslinical Pharmacist Practitioner [facility] [facility]

## 2021-12-16 ENCOUNTER — Other Ambulatory Visit (HOSPITAL_COMMUNITY): Payer: Self-pay

## 2021-12-16 DIAGNOSIS — E559 Vitamin D deficiency, unspecified: Secondary | ICD-10-CM | POA: Diagnosis not present

## 2021-12-16 DIAGNOSIS — Z6831 Body mass index (BMI) 31.0-31.9, adult: Secondary | ICD-10-CM | POA: Diagnosis not present

## 2021-12-16 DIAGNOSIS — E538 Deficiency of other specified B group vitamins: Secondary | ICD-10-CM | POA: Diagnosis not present

## 2021-12-16 DIAGNOSIS — F411 Generalized anxiety disorder: Secondary | ICD-10-CM | POA: Diagnosis not present

## 2021-12-16 DIAGNOSIS — E669 Obesity, unspecified: Secondary | ICD-10-CM | POA: Diagnosis not present

## 2021-12-16 DIAGNOSIS — E119 Type 2 diabetes mellitus without complications: Secondary | ICD-10-CM | POA: Diagnosis not present

## 2021-12-16 DIAGNOSIS — I10 Essential (primary) hypertension: Secondary | ICD-10-CM | POA: Diagnosis not present

## 2021-12-16 DIAGNOSIS — E7800 Pure hypercholesterolemia, unspecified: Secondary | ICD-10-CM | POA: Diagnosis not present

## 2021-12-16 MED ORDER — MOUNJARO 7.5 MG/0.5ML ~~LOC~~ SOAJ
SUBCUTANEOUS | 1 refills | Status: DC
  Filled 2021-12-16: qty 2, 28d supply, fill #0
  Filled 2021-12-17 – 2022-01-18 (×2): qty 2, 28d supply, fill #1

## 2021-12-16 MED ORDER — TECHLITE PEN NEEDLES 31G X 5 MM MISC
12 refills | Status: DC
  Filled 2021-12-16: qty 100, 20d supply, fill #0

## 2021-12-16 MED ORDER — PROPRANOLOL HCL 20 MG PO TABS
20.0000 mg | ORAL_TABLET | Freq: Two times a day (BID) | ORAL | 2 refills | Status: DC
  Filled 2021-12-16 – 2022-08-30 (×3): qty 60, 30d supply, fill #0
  Filled 2022-10-01: qty 60, 30d supply, fill #1
  Filled 2022-11-03: qty 60, 30d supply, fill #2

## 2021-12-17 ENCOUNTER — Other Ambulatory Visit (INDEPENDENT_AMBULATORY_CARE_PROVIDER_SITE_OTHER): Payer: Self-pay

## 2021-12-17 ENCOUNTER — Other Ambulatory Visit (HOSPITAL_COMMUNITY): Payer: Self-pay

## 2021-12-17 MED ORDER — "BD LUER-LOK SYRINGE 25G X 1"" 3 ML MISC"
0 refills | Status: DC
  Filled 2021-12-17: qty 10, 90d supply, fill #0

## 2021-12-17 MED ORDER — ERGOCALCIFEROL 1.25 MG (50000 UT) PO CAPS
ORAL_CAPSULE | ORAL | 0 refills | Status: DC
  Filled 2021-12-17: qty 4, 28d supply, fill #0

## 2021-12-17 MED ORDER — CYANOCOBALAMIN 1000 MCG/ML IJ SOLN
1000.0000 ug | INTRAMUSCULAR | 2 refills | Status: DC
  Filled 2021-12-17 – 2022-02-23 (×10): qty 1, 30d supply, fill #0
  Filled 2022-03-20 – 2022-07-29 (×9): qty 1, 30d supply, fill #1
  Filled 2022-10-01: qty 1, 30d supply, fill #2

## 2021-12-18 ENCOUNTER — Other Ambulatory Visit (HOSPITAL_COMMUNITY): Payer: Self-pay

## 2021-12-19 ENCOUNTER — Other Ambulatory Visit (HOSPITAL_COMMUNITY): Payer: Self-pay

## 2021-12-23 ENCOUNTER — Ambulatory Visit (INDEPENDENT_AMBULATORY_CARE_PROVIDER_SITE_OTHER): Payer: 59 | Admitting: Nurse Practitioner

## 2021-12-24 ENCOUNTER — Other Ambulatory Visit (HOSPITAL_COMMUNITY): Payer: Self-pay

## 2021-12-25 ENCOUNTER — Other Ambulatory Visit (HOSPITAL_COMMUNITY): Payer: Self-pay

## 2021-12-31 ENCOUNTER — Encounter (INDEPENDENT_AMBULATORY_CARE_PROVIDER_SITE_OTHER): Payer: Self-pay

## 2022-01-06 DIAGNOSIS — R7309 Other abnormal glucose: Secondary | ICD-10-CM | POA: Diagnosis not present

## 2022-01-06 DIAGNOSIS — E1122 Type 2 diabetes mellitus with diabetic chronic kidney disease: Secondary | ICD-10-CM | POA: Diagnosis not present

## 2022-01-06 DIAGNOSIS — I2510 Atherosclerotic heart disease of native coronary artery without angina pectoris: Secondary | ICD-10-CM | POA: Diagnosis not present

## 2022-01-18 ENCOUNTER — Other Ambulatory Visit (INDEPENDENT_AMBULATORY_CARE_PROVIDER_SITE_OTHER): Payer: Self-pay

## 2022-01-19 ENCOUNTER — Other Ambulatory Visit (INDEPENDENT_AMBULATORY_CARE_PROVIDER_SITE_OTHER): Payer: Self-pay

## 2022-01-19 ENCOUNTER — Other Ambulatory Visit (HOSPITAL_COMMUNITY): Payer: Self-pay

## 2022-01-19 MED ORDER — ESCITALOPRAM OXALATE 10 MG PO TABS
ORAL_TABLET | ORAL | 0 refills | Status: DC
  Filled 2022-01-19: qty 90, 90d supply, fill #0

## 2022-01-20 ENCOUNTER — Other Ambulatory Visit (HOSPITAL_COMMUNITY): Payer: Self-pay

## 2022-01-20 ENCOUNTER — Other Ambulatory Visit (INDEPENDENT_AMBULATORY_CARE_PROVIDER_SITE_OTHER): Payer: Self-pay

## 2022-01-20 MED ORDER — "SYRINGE/NEEDLE (DISP) 25G X 1"" 3 ML MISC"
0 refills | Status: DC
  Filled 2022-01-20: qty 10, 300d supply, fill #0

## 2022-02-01 ENCOUNTER — Other Ambulatory Visit (INDEPENDENT_AMBULATORY_CARE_PROVIDER_SITE_OTHER): Payer: Self-pay

## 2022-02-01 ENCOUNTER — Other Ambulatory Visit: Payer: Self-pay | Admitting: Cardiology

## 2022-02-02 ENCOUNTER — Other Ambulatory Visit (HOSPITAL_COMMUNITY): Payer: Self-pay

## 2022-02-02 MED ORDER — EZETIMIBE 10 MG PO TABS
10.0000 mg | ORAL_TABLET | Freq: Every day | ORAL | 3 refills | Status: DC
  Filled 2022-02-02: qty 90, 90d supply, fill #0
  Filled 2022-05-06: qty 90, 90d supply, fill #1
  Filled 2022-07-29: qty 90, 90d supply, fill #2
  Filled 2022-10-28: qty 90, 90d supply, fill #3

## 2022-02-03 ENCOUNTER — Other Ambulatory Visit (HOSPITAL_COMMUNITY): Payer: Self-pay

## 2022-02-04 ENCOUNTER — Other Ambulatory Visit (HOSPITAL_COMMUNITY): Payer: Self-pay

## 2022-02-06 ENCOUNTER — Other Ambulatory Visit (HOSPITAL_COMMUNITY): Payer: Self-pay

## 2022-02-09 ENCOUNTER — Telehealth: Payer: Self-pay

## 2022-02-09 NOTE — Telephone Encounter (Signed)
[facility] Patient Advocate Encounter  Patient's medication(Cabenuva) have been couriered to [facility] from [facility] and will be administered on the patient next office visit on 02/16/22.  [name] , [facility] Patient [facility] for Infectious Disease Phone: [phone] Fax:  [phone]

## 2022-02-12 ENCOUNTER — Encounter: Payer: Self-pay | Admitting: Infectious Disease

## 2022-02-12 ENCOUNTER — Other Ambulatory Visit (HOSPITAL_COMMUNITY): Payer: Self-pay

## 2022-02-13 ENCOUNTER — Other Ambulatory Visit (HOSPITAL_COMMUNITY): Payer: Self-pay

## 2022-02-16 ENCOUNTER — Other Ambulatory Visit (INDEPENDENT_AMBULATORY_CARE_PROVIDER_SITE_OTHER): Payer: Self-pay

## 2022-02-16 ENCOUNTER — Other Ambulatory Visit (HOSPITAL_COMMUNITY): Payer: Self-pay

## 2022-02-16 ENCOUNTER — Other Ambulatory Visit: Payer: Self-pay

## 2022-02-16 ENCOUNTER — Ambulatory Visit (INDEPENDENT_AMBULATORY_CARE_PROVIDER_SITE_OTHER): Payer: 59 | Admitting: Pharmacist

## 2022-02-16 DIAGNOSIS — B20 Human immunodeficiency virus [HIV] disease: Secondary | ICD-10-CM | POA: Diagnosis not present

## 2022-02-16 DIAGNOSIS — E119 Type 2 diabetes mellitus without complications: Secondary | ICD-10-CM | POA: Diagnosis not present

## 2022-02-16 DIAGNOSIS — Z6831 Body mass index (BMI) 31.0-31.9, adult: Secondary | ICD-10-CM | POA: Diagnosis not present

## 2022-02-16 DIAGNOSIS — E669 Obesity, unspecified: Secondary | ICD-10-CM | POA: Diagnosis not present

## 2022-02-16 DIAGNOSIS — I10 Essential (primary) hypertension: Secondary | ICD-10-CM | POA: Diagnosis not present

## 2022-02-16 DIAGNOSIS — Z9189 Other specified personal risk factors, not elsewhere classified: Secondary | ICD-10-CM | POA: Diagnosis not present

## 2022-02-16 MED ORDER — CABOTEGRAVIR & RILPIVIRINE ER 600 & 900 MG/3ML IM SUER
1.0000 | Freq: Once | INTRAMUSCULAR | Status: AC
  Administered 2022-02-16: 1 via INTRAMUSCULAR

## 2022-02-16 MED ORDER — MOUNJARO 10 MG/0.5ML ~~LOC~~ SOAJ
10.0000 mg | SUBCUTANEOUS | 0 refills | Status: DC
  Filled 2022-02-16: qty 2, 28d supply, fill #0

## 2022-02-16 NOTE — Progress Notes (Signed)
HPI: [name] is a 56 y.o. male who presents to the [place] clinic for [place] administration.  Patient Active Problem List   Diagnosis Date Noted   Thrombocytosis 05/28/2021   DKA (diabetic ketoacidosis) ([place]) 03/05/2020   High anion gap metabolic acidosis 90/21/1155   Leukocytosis 03/05/2020   Dehydration 03/05/2020   Hyponatremia 03/05/2020   Hyperkalemia 03/05/2020   Hyperglycemia due to diabetes mellitus ([place]) 03/05/2020   Intractable nausea and vomiting 03/05/2020   DKA, type 2 ([place]) 03/05/2020   Other fatigue 03/14/2018   Shortness of breath on exertion 03/14/2018   Uncontrolled type 2 diabetes mellitus with hyperglycemia ([place]) 02/08/2018   Mixed hyperlipidemia 02/08/2018   AKI (acute kidney injury) ([place]) 03/23/2017   Special screening for malignant neoplasms, colon 11/18/2016   Family history of colon cancer 11/18/2016   Lung nodule, multiple 09/27/2014   Poorly controlled type 2 diabetes mellitus with circulatory disorder ([place]) 09/27/2014   Early syphilis, 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   CABOTEGRAVIR & RILPIVIRINE ER (CABENUVA) 600 & 900 MG/3ML INJECTION    Inject 1 kit into the muscle every 2 (two) months.   CHLORTHALIDONE (HYGROTON) 25 MG TABLET    Take 1 tablet (25 mg total) by mouth daily.   CONTINUOUS BLOOD GLUC SENSOR (FREESTYLE LIBRE 14 DAY SENSOR) MISC    Use as directed.   CYANOCOBALAMIN (VITAMIN B12) 1000 MCG/ML INJECTION    Inject 1 mL into the skin  once weekly for 4 weeks. Then inject monthly as directed.   CYANOCOBALAMIN (VITAMIN B12) 1000 MCG/ML INJECTION    Inject 28m (10028m) into the muscle once every 30 days   ERGOCALCIFEROL (VITAMIN D2) 1.25 MG (50000 UT) CAPSULE    Take 1 capsule by mouth once weekly   ESCITALOPRAM (LEXAPRO) 10 MG TABLET    Take 1 tablet (10 mg total) by mouth daily.   ESCITALOPRAM (LEXAPRO) 10 MG TABLET    Take 1 tablet by mouth once daily.   EZETIMIBE (ZETIA) 10 MG TABLET    Take 1 tablet (10 mg total) by mouth daily.   INSULIN DEGLUDEC (TRESIBA FLEXTOUCH) 100 UNIT/ML FLEXTOUCH PEN    INJECT 45 UNITS UNDER THE SKIN TWICE DAILY   INSULIN DEGLUDEC (TRESIBA) 100 UNIT/ML FLEXTOUCH PEN    Inject 45 units into the skin twice daily.   INSULIN GLARGINE (BASAGLAR KWIKPEN) 100 UNIT/ML    Inject 45 Units into the skin 2 (two) times daily.   INSULIN LISPRO (HUMALOG KWIKPEN) 100 UNIT/ML KWIKPEN    INJECT 35 UNITS 4 TIMES DAILY   INSULIN LISPRO (HUMALOG KWIKPEN) 100 UNIT/ML KWIKPEN    INJECT 35 UNITS 4 TIMES DAILY   INSULIN LISPRO (HUMALOG) 100 UNIT/ML KWIKPEN    Inject 35 units into the skin 4 times a day   INSULIN PEN NEEDLE (TECHLITE PEN NEEDLES) 31G X 5 MM MISC    Use as directed   INSULIN PEN NEEDLE (UNIFINE PENTIPS) 31G X 5 MM MISC  Use as directed   INSULIN PEN NEEDLE 31G X 5 MM MISC    Use as directed   LISINOPRIL (ZESTRIL) 10 MG TABLET    TAKE 1 TABLET BY MOUTH EVERY DAY   MULTIPLE VITAMINS-MINERALS (MULTIVITAMIN WITH MINERALS) TABLET    Take 1 tablet by mouth daily.   MUPIROCIN OINTMENT (BACTROBAN) 2 %    Apply topically twice daily   PROPRANOLOL (INDERAL) 20 MG TABLET    Take 1 tablet (20 mg total) by mouth 2 (two) times daily.   PROPRANOLOL (INDERAL) 20 MG TABLET    Take 1 tablet (20 mg total) by mouth 2 (two) times daily.   ROSUVASTATIN (CRESTOR) 40 MG TABLET    Take 1 tablet by mouth daily.   SYRINGE-NEEDLE, DISP, 3 ML 25G X 1" 3 ML MISC    Use to inject Vitamin B12 into the muscle once per month 365 days    TIRZEPATIDE (MOUNJARO) 15 MG/0.5ML PEN    Inject 15 mg into the skin once a week.   TIRZEPATIDE (MOUNJARO) 7.5 MG/0.5ML PEN    Inject 7.5 mg into the skin once a week   VITAMIN D, ERGOCALCIFEROL, (DRISDOL) 1.25 MG (50000 UNIT) CAPS CAPSULE    Take 1 capsule by mouth 2 times a week on Sunday & Wednesday  Modified Medications   No medications on file  Discontinued Medications   No medications on file    Allergies: Allergies  Allergen Reactions   Famotidine Other (See Comments)    Contraindicated with ODEFSEY Other reaction(s): Other (See Comments) Contraindicated with ODEFSEY   Prilosec [Omeprazole] Other (See Comments)    Contraindicated with RPV (lowers level of this ARV in [place] [Emtricitabine-Rilpivirine-Tenofovir Af]     Makes blood sugars run high    Past Medical History: Past Medical History:  Diagnosis Date   CAD (coronary artery disease)    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  Tobacco Use   Smoking status: Never   Smokeless tobacco: Never  Vaping Use   Vaping Use: Never used  Substance and Sexual Activity   Alcohol use: Yes    Alcohol/week: 0.0 - 1.0 standard drinks of alcohol    Comment: Social drinker,  vodka, once a month   Drug use: No   Sexual activity: Not Currently    Partners: Male    Comment: declined condoms  Other Topics Concern   Not on file  Social History Narrative   Not on file   Social Determinants of Health   Financial Resource Strain: Not on file  Food Insecurity: Not on file  Transportation Needs: Not on file  Physical Activity: Not  on file  Stress: Not on file  Social Connections: Not on file    Labs: Lab Results  Component Value Date   HIV1RNAQUANT NOT DETECTED 10/21/2021   HIV1RNAQUANT 31 (H) 08/18/2021   HIV1RNAQUANT Not Detected 06/16/2021   CD4TABS 897 03/18/2021   CD4TABS 1,066 03/18/2020   CD4TABS 880 03/14/2019    RPR and STI Lab Results  Component Value Date   LABRPR NON-REACTIVE 08/18/2021   LABRPR NON-REACTIVE 03/18/2020   LABRPR NON-REACTIVE 03/14/2019   LABRPR NON-REACTIVE 02/22/2018   LABRPR NON REAC 11/30/2016    STI Results GC CT  08/18/2021  9:21 AM Negative    Negative    Negative  Negative    Negative    Negative   02/22/2018 12:00 AM Negative  Negative   11/30/2016 12:00 AM Negative  Negative   10/28/2015 12:00 AM Negative  Negative   09/13/2014 12:00 AM Negative  Negative     Hepatitis B Lab Results  Component Value Date   HEPBSAB REACTIVE (A) 08/18/2021   HEPBSAG NON-REACTIVE 08/18/2021   Hepatitis C No results found for: "HEPCAB", "HCVRNAPCRQN" Hepatitis A Lab Results  Component Value Date   HAV NON-REACTIVE 08/18/2021   Lipids: Lab Results  Component Value Date   CHOL 138 04/21/2021   TRIG 156 (H) 04/21/2021   HDL 38 (L) 04/21/2021   CHOLHDL 3.6 04/21/2021   VLDL 59 (H) 10/28/2015   LDLCALC 73 04/21/2021    TARGET DATE: The 25th  Assessment: [name] presents today for his maintenance Cabenuva injections. Past injections were tolerated well without issues. Will get flu shot at work next month.  Administered cabotegravir 672m/3mL in left upper outer quadrant of the gluteal muscle. Administered  rilpivirine 900 mg/328min the right upper outer quadrant of the gluteal muscle. No issues with injections. He will follow up in 2 months for next set of injections.  Plan: - Cabenuva injections administered - Next injections scheduled for 04/13/22 with me and 06/15/22 with Dr. VaTommy Medal- Call with any issues or questions  Zlaty Alexa L. Kassy Mcenroe, PharmD, BCIDP, AAHIVP, CPBerrien Springslinical Pharmacist Practitioner [facility]

## 2022-02-17 ENCOUNTER — Other Ambulatory Visit (HOSPITAL_COMMUNITY): Payer: Self-pay

## 2022-02-19 ENCOUNTER — Other Ambulatory Visit (HOSPITAL_COMMUNITY): Payer: Self-pay

## 2022-02-21 ENCOUNTER — Other Ambulatory Visit (HOSPITAL_COMMUNITY): Payer: Self-pay

## 2022-02-23 ENCOUNTER — Other Ambulatory Visit (HOSPITAL_COMMUNITY): Payer: Self-pay

## 2022-02-23 MED ORDER — "SYRINGE/NEEDLE (DISP) 25G X 1"" 3 ML MISC"
0 refills | Status: AC
  Filled 2022-02-23: qty 10, 280d supply, fill #0

## 2022-02-26 ENCOUNTER — Other Ambulatory Visit (HOSPITAL_COMMUNITY): Payer: Self-pay

## 2022-02-27 ENCOUNTER — Other Ambulatory Visit (HOSPITAL_COMMUNITY): Payer: Self-pay

## 2022-03-02 ENCOUNTER — Other Ambulatory Visit (HOSPITAL_COMMUNITY): Payer: Self-pay

## 2022-03-02 MED ORDER — ESCITALOPRAM OXALATE 10 MG PO TABS
10.0000 mg | ORAL_TABLET | Freq: Every day | ORAL | 0 refills | Status: DC
  Filled 2022-03-02 – 2022-04-09 (×5): qty 90, 90d supply, fill #0

## 2022-03-04 ENCOUNTER — Other Ambulatory Visit (HOSPITAL_COMMUNITY): Payer: Self-pay

## 2022-03-11 ENCOUNTER — Other Ambulatory Visit (HOSPITAL_COMMUNITY): Payer: Self-pay

## 2022-03-12 ENCOUNTER — Other Ambulatory Visit (HOSPITAL_COMMUNITY): Payer: Self-pay

## 2022-03-19 ENCOUNTER — Other Ambulatory Visit (HOSPITAL_COMMUNITY): Payer: Self-pay

## 2022-03-20 ENCOUNTER — Other Ambulatory Visit (HOSPITAL_COMMUNITY): Payer: Self-pay

## 2022-03-23 ENCOUNTER — Other Ambulatory Visit (HOSPITAL_COMMUNITY): Payer: Self-pay

## 2022-03-23 DIAGNOSIS — E669 Obesity, unspecified: Secondary | ICD-10-CM | POA: Diagnosis not present

## 2022-03-23 DIAGNOSIS — Z6831 Body mass index (BMI) 31.0-31.9, adult: Secondary | ICD-10-CM | POA: Diagnosis not present

## 2022-03-23 DIAGNOSIS — E119 Type 2 diabetes mellitus without complications: Secondary | ICD-10-CM | POA: Diagnosis not present

## 2022-03-23 DIAGNOSIS — I10 Essential (primary) hypertension: Secondary | ICD-10-CM | POA: Diagnosis not present

## 2022-03-23 DIAGNOSIS — Z9189 Other specified personal risk factors, not elsewhere classified: Secondary | ICD-10-CM | POA: Diagnosis not present

## 2022-03-23 DIAGNOSIS — F411 Generalized anxiety disorder: Secondary | ICD-10-CM | POA: Diagnosis not present

## 2022-03-23 MED ORDER — MOUNJARO 12.5 MG/0.5ML ~~LOC~~ SOAJ
12.5000 mg | SUBCUTANEOUS | 0 refills | Status: DC
  Filled 2022-03-23 – 2022-04-15 (×4): qty 2, 28d supply, fill #0

## 2022-03-23 MED ORDER — MOUNJARO 10 MG/0.5ML ~~LOC~~ SOAJ
10.0000 mg | SUBCUTANEOUS | 0 refills | Status: DC
  Filled 2022-03-23: qty 2, 28d supply, fill #0

## 2022-03-31 ENCOUNTER — Other Ambulatory Visit (HOSPITAL_COMMUNITY): Payer: Self-pay

## 2022-03-31 ENCOUNTER — Other Ambulatory Visit: Payer: Self-pay | Admitting: Pharmacist

## 2022-03-31 DIAGNOSIS — B20 Human immunodeficiency virus [HIV] disease: Secondary | ICD-10-CM

## 2022-03-31 MED ORDER — CABOTEGRAVIR & RILPIVIRINE ER 600 & 900 MG/3ML IM SUER
1.0000 | INTRAMUSCULAR | 5 refills | Status: DC
  Filled 2022-03-31: qty 6, 60d supply, fill #0
  Filled 2022-04-01: qty 6, 30d supply, fill #0
  Filled 2022-05-06 – 2022-06-09 (×5): qty 6, 30d supply, fill #1
  Filled 2022-07-29 – 2022-08-13 (×3): qty 6, 30d supply, fill #2
  Filled 2022-10-01 – 2022-10-07 (×4): qty 6, 30d supply, fill #3
  Filled 2022-11-03 – 2022-12-03 (×6): qty 6, 30d supply, fill #4
  Filled 2023-02-08: qty 6, 30d supply, fill #5

## 2022-04-01 ENCOUNTER — Other Ambulatory Visit (HOSPITAL_COMMUNITY): Payer: Self-pay

## 2022-04-02 ENCOUNTER — Telehealth: Payer: Self-pay

## 2022-04-02 NOTE — Telephone Encounter (Signed)
[facility] Patient Advocate Encounter  Patient's medication [name]) have been couriered to [facility] from [facility] and will be administered on the patient next office visit on 04/13/22.  [name] , [facility] Patient [facility] for [facility]ious Disease Phone: [phone] Fax:  [phone]

## 2022-04-03 ENCOUNTER — Other Ambulatory Visit (HOSPITAL_COMMUNITY): Payer: Self-pay

## 2022-04-09 ENCOUNTER — Other Ambulatory Visit (HOSPITAL_COMMUNITY): Payer: Self-pay

## 2022-04-11 NOTE — Progress Notes (Signed)
HPI: [name] is a 56 y.o. male who presents to the [facility] for [place] administration.  Patient Active Problem List   Diagnosis Date Noted   Thrombocytosis 05/28/2021   DKA (diabetic ketoacidosis) ([place]) 03/05/2020   High anion gap metabolic acidosis 35/36/1443   Leukocytosis 03/05/2020   Dehydration 03/05/2020   Hyponatremia 03/05/2020   Hyperkalemia 03/05/2020   Hyperglycemia due to diabetes mellitus ([place]) 03/05/2020   Intractable nausea and vomiting 03/05/2020   DKA, type 2 ([place]) 03/05/2020   Other fatigue 03/14/2018   Shortness of breath on exertion 03/14/2018   Uncontrolled type 2 diabetes mellitus with hyperglycemia ([place]) 02/08/2018   Mixed hyperlipidemia 02/08/2018   AKI (acute kidney injury) ([place]) 03/23/2017   Special screening for malignant neoplasms, colon 11/18/2016   Family history of colon cancer 11/18/2016   Lung nodule, multiple 09/27/2014   Poorly controlled type 2 diabetes mellitus with circulatory disorder ([place]) 09/27/2014   Early syphilis, 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   Hypercholesterolemia 01/25/2013   Ejection fraction    Essential hypertension, benign    Human immunodeficiency virus (HIV) disease ([place]) 09/11/2011   Kidney stone on left side 01/04/2011   DKA (diabetic ketoacidoses) 01/04/2011   Coronary artery disease due to lipid rich plaque    Secondary syphilis    LFTs abnormal     Patient's Medications  New Prescriptions   No medications on file  Previous Medications   ASPIRIN 81 MG TABLET    Take 1 tablet (81 mg total) by mouth daily with breakfast.   CABOTEGRAVIR & RILPIVIRINE ER (CABENUVA) 600 & 900 MG/3ML INJECTION    Inject 1 kit into the muscle every 2 (two) months.   CHLORTHALIDONE (HYGROTON) 25 MG TABLET    Take 1 tablet (25 mg total) by mouth daily.   CONTINUOUS BLOOD GLUC SENSOR (FREESTYLE LIBRE 14 DAY SENSOR) MISC    Use as directed.   CYANOCOBALAMIN (VITAMIN B12) 1000 MCG/ML INJECTION    Inject 1 mL into the skin  once weekly for 4 weeks. Then inject monthly as directed.   CYANOCOBALAMIN (VITAMIN B12) 1000 MCG/ML INJECTION    Inject 58m (1009m) into the muscle once every 30 days   ERGOCALCIFEROL (VITAMIN D2) 1.25 MG (50000 UT) CAPSULE    Take 1 capsule by mouth once weekly   ESCITALOPRAM (LEXAPRO) 10 MG TABLET    Take 1 tablet by mouth once daily.   ESCITALOPRAM (LEXAPRO) 10 MG TABLET    Take 1 tablet (10 mg total) by mouth daily.   EZETIMIBE (ZETIA) 10 MG TABLET    Take 1 tablet (10 mg total) by mouth daily.   INSULIN DEGLUDEC (TRESIBA FLEXTOUCH) 100 UNIT/ML FLEXTOUCH PEN    INJECT 45 UNITS UNDER THE SKIN TWICE DAILY   INSULIN DEGLUDEC (TRESIBA) 100 UNIT/ML FLEXTOUCH PEN    Inject 45 units into the skin twice daily.   INSULIN GLARGINE (BASAGLAR KWIKPEN) 100 UNIT/ML    Inject 45 Units into the skin 2 (two) times daily.   INSULIN LISPRO (HUMALOG KWIKPEN) 100 UNIT/ML KWIKPEN    INJECT 35 UNITS 4 TIMES DAILY   INSULIN LISPRO (HUMALOG KWIKPEN) 100 UNIT/ML KWIKPEN    INJECT 35 UNITS 4 TIMES DAILY   INSULIN LISPRO (HUMALOG) 100 UNIT/ML KWIKPEN    Inject 35 units into the skin 4 times a day   INSULIN PEN NEEDLE (TECHLITE PEN NEEDLES) 31G X 5 MM MISC    Use as directed   INSULIN PEN NEEDLE (UNIFINE PENTIPS) 31G X 5 MM MISC  Use as directed   INSULIN PEN NEEDLE 31G X 5 MM MISC    Use as directed   LISINOPRIL (ZESTRIL) 10 MG TABLET    TAKE 1 TABLET BY MOUTH EVERY DAY   MULTIPLE VITAMINS-MINERALS (MULTIVITAMIN WITH MINERALS) TABLET    Take 1 tablet by mouth daily.   MUPIROCIN OINTMENT (BACTROBAN) 2 %    Apply topically twice daily   PROPRANOLOL (INDERAL) 20 MG TABLET    Take 1 tablet (20 mg total) by mouth 2 (two) times daily.   PROPRANOLOL (INDERAL) 20 MG TABLET    Take 1 tablet (20 mg total) by mouth 2 (two) times daily.   ROSUVASTATIN (CRESTOR) 40 MG TABLET    Take 1 tablet by mouth daily.   SYRINGE-NEEDLE, DISP, 3 ML 25G X 1" 3 ML MISC    Use to inject Vitamin B12 into the muscle once a month   TIRZEPATIDE  (MOUNJARO) 10 MG/0.5ML PEN    Inject 10 mg into the skin once a week.   TIRZEPATIDE (MOUNJARO) 12.5 MG/0.5ML PEN    Inject 12.5 mg into the skin once a week.   TIRZEPATIDE (MOUNJARO) 15 MG/0.5ML PEN    Inject 15 mg into the skin once a week.   TIRZEPATIDE (MOUNJARO) 7.5 MG/0.5ML PEN    Inject 7.5 mg into the skin once a week   VITAMIN D, ERGOCALCIFEROL, (DRISDOL) 1.25 MG (50000 UNIT) CAPS CAPSULE    Take 1 capsule by mouth 2 times a week on Sunday & Wednesday  Modified Medications   No medications on file  Discontinued Medications   No medications on file    Allergies: Allergies  Allergen Reactions   Famotidine Other (See Comments)    Contraindicated with ODEFSEY Other reaction(s): Other (See Comments) Contraindicated with ODEFSEY   Prilosec [Omeprazole] Other (See Comments)    Contraindicated with RPV (lowers level of this ARV in [place] [Emtricitabine-Rilpivirine-Tenofovir Af]     Makes blood sugars run high    Past Medical History: Past Medical History:  Diagnosis Date   CAD (coronary artery disease)    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  Tobacco Use   Smoking status: Never   Smokeless tobacco: Never  Vaping Use    Vaping Use: Never used  Substance and Sexual Activity   Alcohol use: Yes    Alcohol/week: 0.0 - 1.0 standard drinks of alcohol    Comment: Social drinker, vodka, once a month   Drug use: No   Sexual activity: Not Currently    Partners: Male    Comment: declined condoms  Other Topics Concern   Not on file  Social History  Narrative   Not on file   Social Determinants of Health   Financial Resource Strain: Not on file  Food Insecurity: Not on file  Transportation Needs: Not on file  Physical Activity: Not on file  Stress: Not on file  Social Connections: Not on file    Labs: Lab Results  Component Value Date   HIV1RNAQUANT NOT DETECTED 10/21/2021   HIV1RNAQUANT 31 (H) 08/18/2021   HIV1RNAQUANT Not Detected 06/16/2021   CD4TABS 897 03/18/2021   CD4TABS 1,066 03/18/2020   CD4TABS 880 03/14/2019    RPR and STI Lab Results  Component Value Date   LABRPR NON-REACTIVE 08/18/2021   LABRPR NON-REACTIVE 03/18/2020   LABRPR NON-REACTIVE 03/14/2019   LABRPR NON-REACTIVE 02/22/2018   LABRPR NON REAC 11/30/2016    STI Results GC CT  08/18/2021  9:21 AM Negative    Negative    Negative  Negative    Negative    Negative   02/22/2018 12:00 AM Negative  Negative   11/30/2016 12:00 AM Negative  Negative   10/28/2015 12:00 AM Negative  Negative   09/13/2014 12:00 AM Negative  Negative     Hepatitis B Lab Results  Component Value Date   HEPBSAB REACTIVE (A) 08/18/2021   HEPBSAG NON-REACTIVE 08/18/2021   Hepatitis C No results found for: "HEPCAB", "HCVRNAPCRQN" Hepatitis A Lab Results  Component Value Date   HAV NON-REACTIVE 08/18/2021   Lipids: Lab Results  Component Value Date   CHOL 138 04/21/2021   TRIG 156 (H) 04/21/2021   HDL 38 (L) 04/21/2021   CHOLHDL 3.6 04/21/2021   VLDL 59 (H) 10/28/2015   LDLCALC 73 04/21/2021    TARGET DATE: The 25th of the month  Assessment: [name] presents today for his maintenance Cabenuva injections. Past injections  were tolerated well without issues. He was offered condoms and STI screening which he deferred at this time.  He is eligible for the Hep A, COVID, Mpox and flu vaccines. After discussing the risks and benefits, he agreed to the COVID and Hep A vaccines. He did state that he received the flu and Mpox vaccines already. We discussed the Shingrix vaccine after he showed interest and we explained we don't have the vaccine here, but we would send a prescription to his preferred pharmacy. The Hep A and COVID vaccines were administered in the left deltoid.  Administered cabotegravir 631m/3mL in left upper outer quadrant of the gluteal muscle. Administered rilpivirine 900 mg/349min the right upper outer quadrant of the gluteal muscle. No issues with injections. He will follow up in 2 months for next set of injections.  Plan: - Cabenuva injections administered - Administered the Hep A and COVID vaccine - Collect HIV Viral load, CD4 count and lipid panel - Next injections scheduled for 06/15/2022 at 9:15 AM with Dr. VaTommy Medal Following injections scheduled for 08/17/2022 at 8:45 am with Cassie - 2nd Hep A vaccine due May 2024 - Call with any issues or questions  [facility]

## 2022-04-13 ENCOUNTER — Ambulatory Visit (INDEPENDENT_AMBULATORY_CARE_PROVIDER_SITE_OTHER): Payer: 59

## 2022-04-13 ENCOUNTER — Ambulatory Visit (INDEPENDENT_AMBULATORY_CARE_PROVIDER_SITE_OTHER): Payer: 59 | Admitting: Pharmacist

## 2022-04-13 ENCOUNTER — Other Ambulatory Visit (HOSPITAL_COMMUNITY): Payer: Self-pay

## 2022-04-13 ENCOUNTER — Other Ambulatory Visit: Payer: Self-pay

## 2022-04-13 ENCOUNTER — Other Ambulatory Visit: Payer: Self-pay | Admitting: Pharmacist

## 2022-04-13 DIAGNOSIS — B20 Human immunodeficiency virus [HIV] disease: Secondary | ICD-10-CM

## 2022-04-13 DIAGNOSIS — Z23 Encounter for immunization: Secondary | ICD-10-CM

## 2022-04-13 MED ORDER — CABOTEGRAVIR & RILPIVIRINE ER 600 & 900 MG/3ML IM SUER
1.0000 | Freq: Once | INTRAMUSCULAR | Status: AC
  Administered 2022-04-13: 1 via INTRAMUSCULAR

## 2022-04-13 MED ORDER — ZOSTER VAC RECOMB ADJUVANTED 50 MCG/0.5ML IM SUSR
0.5000 mL | Freq: Once | INTRAMUSCULAR | 1 refills | Status: AC
  Filled 2022-04-13: qty 0.5, 1d supply, fill #0

## 2022-04-14 ENCOUNTER — Other Ambulatory Visit (HOSPITAL_COMMUNITY): Payer: Self-pay

## 2022-04-14 LAB — T-HELPER CELL (CD4) - (RCID CLINIC ONLY)
CD4 % Helper T Cell: 48 % (ref 33–65)
CD4 T Cell Abs: 904 /uL (ref 400–1790)

## 2022-04-15 ENCOUNTER — Other Ambulatory Visit (HOSPITAL_COMMUNITY): Payer: Self-pay

## 2022-04-15 LAB — LIPID PANEL
Cholesterol: 271 mg/dL — ABNORMAL HIGH (ref <200)
HDL: 56 mg/dL (ref >=40)
LDL Cholesterol (Calc): 186 mg/dL (calc) — ABNORMAL HIGH
Non-HDL Cholesterol (Calc): 215 mg/dL (calc) — ABNORMAL HIGH (ref <130)
Total CHOL/HDL Ratio: 4.8 (calc) (ref <5.0)
Triglycerides: 148 mg/dL (ref <150)

## 2022-04-15 LAB — HIV-1 RNA QUANT-NO REFLEX-BLD
HIV 1 RNA Quant: <20 Copies/mL — ABNORMAL HIGH
HIV-1 RNA Quant, Log: <1.3 Log cps/mL — ABNORMAL HIGH

## 2022-04-17 ENCOUNTER — Other Ambulatory Visit (HOSPITAL_COMMUNITY): Payer: Self-pay

## 2022-04-20 ENCOUNTER — Other Ambulatory Visit (HOSPITAL_COMMUNITY): Payer: Self-pay

## 2022-04-20 DIAGNOSIS — I10 Essential (primary) hypertension: Secondary | ICD-10-CM | POA: Diagnosis not present

## 2022-04-20 DIAGNOSIS — E669 Obesity, unspecified: Secondary | ICD-10-CM | POA: Diagnosis not present

## 2022-04-20 DIAGNOSIS — Z6830 Body mass index (BMI) 30.0-30.9, adult: Secondary | ICD-10-CM | POA: Diagnosis not present

## 2022-04-20 DIAGNOSIS — E119 Type 2 diabetes mellitus without complications: Secondary | ICD-10-CM | POA: Diagnosis not present

## 2022-04-20 MED ORDER — MOUNJARO 12.5 MG/0.5ML ~~LOC~~ SOAJ
12.5000 mg | SUBCUTANEOUS | 2 refills | Status: DC
  Filled 2022-04-20 – 2022-05-08 (×2): qty 2, 28d supply, fill #0
  Filled 2022-06-08 – 2022-10-06 (×2): qty 2, 28d supply, fill #1

## 2022-04-21 ENCOUNTER — Other Ambulatory Visit (HOSPITAL_COMMUNITY): Payer: Self-pay

## 2022-04-21 MED ORDER — ESCITALOPRAM OXALATE 10 MG PO TABS
10.0000 mg | ORAL_TABLET | Freq: Every day | ORAL | 0 refills | Status: DC
  Filled 2022-04-21 – 2022-07-01 (×2): qty 90, 90d supply, fill #0

## 2022-04-22 ENCOUNTER — Other Ambulatory Visit (HOSPITAL_COMMUNITY): Payer: Self-pay

## 2022-05-06 ENCOUNTER — Other Ambulatory Visit (HOSPITAL_COMMUNITY): Payer: Self-pay

## 2022-05-08 ENCOUNTER — Other Ambulatory Visit: Payer: Self-pay

## 2022-05-08 ENCOUNTER — Other Ambulatory Visit (HOSPITAL_COMMUNITY): Payer: Self-pay

## 2022-05-09 DIAGNOSIS — Z760 Encounter for issue of repeat prescription: Secondary | ICD-10-CM | POA: Diagnosis not present

## 2022-05-12 ENCOUNTER — Other Ambulatory Visit (HOSPITAL_COMMUNITY): Payer: Self-pay

## 2022-05-12 ENCOUNTER — Other Ambulatory Visit: Payer: Self-pay

## 2022-05-12 DIAGNOSIS — I10 Essential (primary) hypertension: Secondary | ICD-10-CM | POA: Diagnosis not present

## 2022-05-12 DIAGNOSIS — E785 Hyperlipidemia, unspecified: Secondary | ICD-10-CM | POA: Diagnosis not present

## 2022-05-12 DIAGNOSIS — E1169 Type 2 diabetes mellitus with other specified complication: Secondary | ICD-10-CM | POA: Diagnosis not present

## 2022-05-12 MED ORDER — LORAZEPAM 0.5 MG PO TABS
0.5000 mg | ORAL_TABLET | Freq: Two times a day (BID) | ORAL | 3 refills | Status: DC | PRN
  Filled 2022-05-12: qty 30, 15d supply, fill #0
  Filled 2022-07-29: qty 30, 15d supply, fill #1
  Filled 2022-10-15 – 2022-10-22 (×2): qty 30, 15d supply, fill #2

## 2022-05-19 ENCOUNTER — Other Ambulatory Visit (HOSPITAL_COMMUNITY): Payer: Self-pay

## 2022-05-19 DIAGNOSIS — I10 Essential (primary) hypertension: Secondary | ICD-10-CM | POA: Diagnosis not present

## 2022-05-19 DIAGNOSIS — E119 Type 2 diabetes mellitus without complications: Secondary | ICD-10-CM | POA: Diagnosis not present

## 2022-05-19 DIAGNOSIS — Z9189 Other specified personal risk factors, not elsewhere classified: Secondary | ICD-10-CM | POA: Diagnosis not present

## 2022-05-19 DIAGNOSIS — E669 Obesity, unspecified: Secondary | ICD-10-CM | POA: Diagnosis not present

## 2022-05-19 DIAGNOSIS — Z6830 Body mass index (BMI) 30.0-30.9, adult: Secondary | ICD-10-CM | POA: Diagnosis not present

## 2022-05-19 MED ORDER — MOUNJARO 15 MG/0.5ML ~~LOC~~ SOAJ
15.0000 mg | SUBCUTANEOUS | 0 refills | Status: DC
  Filled 2022-05-19 – 2022-06-05 (×2): qty 2, 28d supply, fill #0

## 2022-06-04 ENCOUNTER — Other Ambulatory Visit (HOSPITAL_COMMUNITY): Payer: Self-pay

## 2022-06-05 ENCOUNTER — Other Ambulatory Visit (HOSPITAL_COMMUNITY): Payer: Self-pay

## 2022-06-08 ENCOUNTER — Other Ambulatory Visit: Payer: Self-pay

## 2022-06-08 ENCOUNTER — Other Ambulatory Visit (HOSPITAL_COMMUNITY): Payer: Self-pay

## 2022-06-08 ENCOUNTER — Other Ambulatory Visit (HOSPITAL_BASED_OUTPATIENT_CLINIC_OR_DEPARTMENT_OTHER): Payer: Self-pay

## 2022-06-09 ENCOUNTER — Encounter: Payer: Self-pay | Admitting: Pharmacist

## 2022-06-09 ENCOUNTER — Other Ambulatory Visit (HOSPITAL_COMMUNITY): Payer: Self-pay

## 2022-06-09 ENCOUNTER — Other Ambulatory Visit: Payer: Self-pay

## 2022-06-10 ENCOUNTER — Telehealth: Payer: Self-pay

## 2022-06-10 NOTE — Telephone Encounter (Signed)
[facility] Patient Advocate Encounter  Patient's medications have been couriered to [facility] from [place]: [id number] , and will be administered on  06/15/2022.

## 2022-06-12 ENCOUNTER — Other Ambulatory Visit: Payer: Self-pay

## 2022-06-14 ENCOUNTER — Encounter: Payer: Self-pay | Admitting: Infectious Disease

## 2022-06-14 DIAGNOSIS — Z7185 Encounter for immunization safety counseling: Secondary | ICD-10-CM

## 2022-06-14 HISTORY — DX: Encounter for immunization safety counseling: Z71.85

## 2022-06-14 NOTE — Progress Notes (Signed)
Subjective:  Chief complaint: followup for HIV disease on Cabenuva   Patient ID: [name], male    DOB: 1965/06/25, 57 y.o.   MRN: [id number]  HPI  [name] is a 57 year old Caucasian man who has HIV infection that has been peripheral controlled on [place] and now [place].  He does have comorbid insulin-dependent diabetes mellitus coronary artery disease hyperlipidemia and obesity.  Also has hypertension and was hypertensive on arrival today in the clinic which he attributes to having to rush around quite a bit.   [name] is continued on [place] and prefers this infinitely better than oral therapy as he does not like to after member to take pills and his peace of mind knowing that he has the injections and once they are done he does not have to worry about taking his antiretrovirals.  He has been on his fair amount of stress related to his mother who has dementia and who took a turn for the worse with a urinary tract infection recently.  She is still is at home and he is her primary caregiver he does have emotional support for himself but he does not have anyone else helping him out with his mother at present.  He is up-to-date on his flu COVID vaccines.  His LDL is not at goal but he may have been taking his statin as religiously as he should.     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   Syphilitic hepatitis    Type 2 diabetes mellitus [facility])     Past Surgical History:  Procedure Laterality Date   BIOPSY  12/28/2019    Procedure: BIOPSY;  Surgeon: Rogene Houston, MD;  [facility];  Service: Endoscopy;;   CHOLECYSTECTOMY OPEN  07/2001   COLONOSCOPY N/A 05/28/2017   Procedure: COLONOSCOPY;  Surgeon: Rogene Houston, MD;  [facility];  Service: Endoscopy;  Laterality: N/A;  200 - pt to prep in Endo   CORONARY ANGIOPLASTY WITH STENT PLACEMENT  03/2006; 10/2007;    ESOPHAGEAL DILATION N/A 12/28/2019   Procedure: ESOPHAGEAL DILATION;  Surgeon: Rogene Houston, MD;  [facility];  Service: Endoscopy;  Laterality: N/A;   ESOPHAGOGASTRODUODENOSCOPY N/A 12/28/2019   Procedure: ESOPHAGOGASTRODUODENOSCOPY (EGD);  Surgeon: Rogene Houston, MD;  [facility];  Service: Endoscopy;  Laterality: N/A;  125, per Lelon Frohlich moved to 10:55 pt aware   [place] / [place] / [place]  07/2001   UHR   POLYPECTOMY  05/28/2017   Procedure: POLYPECTOMY;  Surgeon: Rogene Houston, MD;  [facility];  Service: Endoscopy;;  colon    Family History  Problem Relation Age of Onset   Heart attack Father        7 heart attacks   CAD Father    Alcoholism Father    Hyperlipidemia Mother    Hypertension Mother    Anemia Other    Diabetes  Other    CAD Sister    Diabetes Sister       Social History   Socioeconomic History   Marital status: Single    Spouse name: Not on file   Number of children: 0   Years of education: Not on file   Highest education level: Not on file  Occupational History   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  Tobacco Use   Smoking status: Never   Smokeless tobacco: Never  Vaping Use   Vaping Use: Never used  Substance and Sexual Activity   Alcohol use: Yes    Alcohol/week: 0.0 - 1.0 standard drinks of alcohol    Comment: Social drinker, vodka, once a month   Drug use: No   Sexual activity: Not Currently    Partners: Male    Comment: declined condoms  Other Topics Concern    Not on file  Social History Narrative   Not on file   Social Determinants of Health   Financial Resource Strain: Not on file  Food Insecurity: Not on file  Transportation Needs: Not on file  Physical Activity: Not on file  Stress: Not on file  Social Connections: Not on file    Allergies  Allergen Reactions   Famotidine Other (See Comments)    Contraindicated with ODEFSEY Other reaction(s): Other (See Comments) Contraindicated with ODEFSEY   Prilosec [Omeprazole] Other (See Comments)    Contraindicated with RPV (lowers level of this ARV in ODEFSEY   Odefsey [Emtricitabine-Rilpivirine-Tenofovir Af]     Makes blood sugars run high     Current Outpatient Medications:    aspirin 81 MG tablet, Take 1 tablet (81 mg total) by mouth daily with breakfast., Disp: 30 tablet, Rfl: 3   cabotegravir & rilpivirine ER (CABENUVA) 600 & 900 MG/3ML injection, Inject 1 kit into the muscle every 2 (two) months., Disp: 6 mL, Rfl: 5   chlorthalidone (HYGROTON) 25 MG tablet, Take 1 tablet (25 mg total) by mouth daily., Disp: 90 tablet, Rfl: 3   Continuous Blood Gluc Sensor (FREESTYLE LIBRE 14 DAY SENSOR) MISC, Use as directed., Disp: 2 each, Rfl: 12   cyanocobalamin (VITAMIN B12) 1000 MCG/ML injection, Inject 1 mL into the skin once weekly for 4 weeks. Then inject monthly as directed., Disp: 12 mL, Rfl: 2   cyanocobalamin (VITAMIN B12) 1000 MCG/ML injection, Inject 83m (10038m) into the muscle once every 30 days, Disp: 1 mL, Rfl: 2   ergocalciferol (VITAMIN D2) 1.25 MG (50000 UT) capsule, Take 1 capsule by mouth once weekly, Disp: 4 capsule, Rfl: 0   escitalopram (LEXAPRO) 10 MG tablet, Take 1 tablet (10 mg total) by mouth daily., Disp: 90 tablet, Rfl: 0   escitalopram (LEXAPRO) 10 MG tablet, Take 1 tablet (10 mg total) by mouth daily., Disp: 90 tablet, Rfl: 0   ezetimibe (ZETIA) 10 MG tablet, Take 1 tablet (10 mg total) by mouth daily., Disp: 90 tablet, Rfl: 3   insulin degludec (TRESIBA  FLEXTOUCH) 100 UNIT/ML FlexTouch Pen, INJECT 45 UNITS UNDER THE SKIN TWICE DAILY, Disp: 30 mL, Rfl: 12   insulin degludec (TRESIBA) 100 UNIT/ML FlexTouch Pen, Inject 45 units into the skin twice daily., Disp: 30 mL, Rfl: 12   Insulin Glargine (BASAGLAR KWIKPEN) 100 UNIT/ML, Inject 45 Units into the skin 2 (two) times daily., Disp: , Rfl:    insulin lispro (HUMALOG KWIKPEN) 100 UNIT/ML KwikPen, INJECT 35 UNITS 4 TIMES DAILY, Disp: 90 mL, Rfl: 4   insulin lispro (HUMALOG KWIKPEN) 100 UNIT/ML  KwikPen, INJECT 35 UNITS 4 TIMES DAILY, Disp: 90 mL, Rfl: 4   insulin lispro (HUMALOG) 100 UNIT/ML KwikPen, Inject 35 units into the skin 4 times a day, Disp: 90 mL, Rfl: 4   Insulin Pen Needle (TECHLITE PEN NEEDLES) 31G X 5 MM MISC, Use as directed, Disp: 150 each, Rfl: 12   Insulin Pen Needle (UNIFINE PENTIPS) 31G X 5 MM MISC, Use as directed, Disp: 150 each, Rfl: 4   Insulin Pen Needle 31G X 5 MM MISC, Use as directed, Disp: 150 each, Rfl: 4   lisinopril (ZESTRIL) 10 MG tablet, TAKE 1 TABLET BY MOUTH EVERY DAY, Disp: 90 tablet, Rfl: 3   LORazepam (ATIVAN) 0.5 MG tablet, Take 1 tablet (0.5 mg total) by mouth 2 (two) times daily as needed., Disp: 30 tablet, Rfl: 3   Multiple Vitamins-Minerals (MULTIVITAMIN WITH MINERALS) tablet, Take 1 tablet by mouth daily., Disp: , Rfl:    mupirocin ointment (BACTROBAN) 2 %, Apply topically twice daily, Disp: 22 g, Rfl: 2   propranolol (INDERAL) 20 MG tablet, Take 1 tablet (20 mg total) by mouth 2 (two) times daily., Disp: 180 tablet, Rfl: 3   propranolol (INDERAL) 20 MG tablet, Take 1 tablet (20 mg total) by mouth 2 (two) times daily., Disp: 60 tablet, Rfl: 2   rosuvastatin (CRESTOR) 40 MG tablet, Take 1 tablet by mouth daily., Disp: 90 tablet, Rfl: 3   SYRINGE-NEEDLE, DISP, 3 ML 25G X 1" 3 ML MISC, Use to inject Vitamin B12 into the muscle once a month, Disp: 10 each, Rfl: 0   tirzepatide (MOUNJARO) 10 MG/0.5ML Pen, Inject 10 mg into the skin once a week., Disp: 2 mL, Rfl:  0   tirzepatide (MOUNJARO) 12.5 MG/0.5ML Pen, Inject 12.5 mg into the skin once a week., Disp: 2 mL, Rfl: 2   tirzepatide (MOUNJARO) 15 MG/0.5ML Pen, Inject 15 mg into the skin once a week., Disp: 2 mL, Rfl: 0   tirzepatide (MOUNJARO) 15 MG/0.5ML Pen, Inject 15 mg into the skin once a week., Disp: 2 mL, Rfl: 0   tirzepatide (MOUNJARO) 7.5 MG/0.5ML Pen, Inject 7.5 mg into the skin once a week, Disp: 2 mL, Rfl: 1   Vitamin D, Ergocalciferol, (DRISDOL) 1.25 MG (50000 UNIT) CAPS capsule, Take 1 capsule by mouth 2 times a week on Sunday & Wednesday, Disp: 8 capsule, Rfl: 0   Review of Systems  Constitutional:  Negative for activity change, appetite change, chills, diaphoresis, fatigue, fever and unexpected weight change.  HENT:  Negative for congestion, rhinorrhea, sinus pressure, sneezing, sore throat and trouble swallowing.   Eyes:  Negative for photophobia and visual disturbance.  Respiratory:  Negative for cough, chest tightness, shortness of breath, wheezing and stridor.   Cardiovascular:  Negative for chest pain, palpitations and leg swelling.  Gastrointestinal:  Negative for abdominal distention, abdominal pain, anal bleeding, blood in stool, constipation, diarrhea, nausea and vomiting.  Genitourinary:  Negative for difficulty urinating, dysuria, flank pain and hematuria.  Musculoskeletal:  Negative for arthralgias, back pain, gait problem, joint swelling and myalgias.  Skin:  Negative for color change, pallor, rash and wound.  Neurological:  Negative for dizziness, tremors, weakness and light-headedness.  Hematological:  Negative for adenopathy. Does not bruise/bleed easily.  Psychiatric/Behavioral:  Negative for agitation, behavioral problems, confusion, decreased concentration, dysphoric mood and sleep disturbance.        Objective:   Physical Exam Constitutional:      Appearance: He is well-developed.  HENT:     Head: Normocephalic and atraumatic.  Eyes:     Conjunctiva/sclera:  Conjunctivae normal.  Cardiovascular:     Rate and Rhythm: Normal rate and regular rhythm.  Pulmonary:     Effort: Pulmonary effort is normal. No respiratory distress.     Breath sounds: No wheezing.  Abdominal:     General: There is no distension.     Palpations: Abdomen is soft.  Musculoskeletal:        General: No tenderness. Normal range of motion.     Cervical back: Normal range of motion and neck supple.  Skin:    General: Skin is warm and dry.     Coloration: Skin is not pale.     Findings: No erythema or rash.  Neurological:     General: No focal deficit present.     Mental Status: He is alert and oriented to person, place, and time.  Psychiatric:        Mood and Affect: Mood normal.        Behavior: Behavior normal.        Thought Content: Thought content normal.        Judgment: Judgment normal.           Assessment & Plan:  HIV disease/  Check HIV RNA quant  CAD: continue beta blocker, ACE and statin  Hyperlipidemia: continue crestor and he ison 40 as well as with zetia   Insulin-dependent diabetes on insulin follows with [facility] Per [facility] needs ophtho exam and A1c  Vaccine counseling: Had his updated flu shot and COVID shot and should also get a and Shingrix vaccine

## 2022-06-15 ENCOUNTER — Encounter: Payer: Self-pay | Admitting: Infectious Disease

## 2022-06-15 ENCOUNTER — Other Ambulatory Visit: Payer: Self-pay

## 2022-06-15 ENCOUNTER — Ambulatory Visit (INDEPENDENT_AMBULATORY_CARE_PROVIDER_SITE_OTHER): Payer: 59 | Admitting: Infectious Disease

## 2022-06-15 VITALS — BP 136/82 | HR 105 | Temp 97.8°F | Ht 66.0 in | Wt 198.0 lb

## 2022-06-15 DIAGNOSIS — E1110 Type 2 diabetes mellitus with ketoacidosis without coma: Secondary | ICD-10-CM

## 2022-06-15 DIAGNOSIS — I2583 Coronary atherosclerosis due to lipid rich plaque: Secondary | ICD-10-CM

## 2022-06-15 DIAGNOSIS — Z7185 Encounter for immunization safety counseling: Secondary | ICD-10-CM | POA: Diagnosis not present

## 2022-06-15 DIAGNOSIS — Z7985 Long-term (current) use of injectable non-insulin antidiabetic drugs: Secondary | ICD-10-CM

## 2022-06-15 DIAGNOSIS — I2510 Atherosclerotic heart disease of native coronary artery without angina pectoris: Secondary | ICD-10-CM

## 2022-06-15 DIAGNOSIS — B20 Human immunodeficiency virus [HIV] disease: Secondary | ICD-10-CM | POA: Diagnosis not present

## 2022-06-15 DIAGNOSIS — Z794 Long term (current) use of insulin: Secondary | ICD-10-CM | POA: Diagnosis not present

## 2022-06-15 MED ORDER — CABOTEGRAVIR & RILPIVIRINE ER 600 & 900 MG/3ML IM SUER
1.0000 | Freq: Once | INTRAMUSCULAR | Status: AC
  Administered 2022-06-15: 1 via INTRAMUSCULAR

## 2022-06-15 NOTE — Addendum Note (Signed)
Addended by: Leatrice Jewels on: 06/15/2022 10:01 AM   Modules accepted: Orders

## 2022-06-16 ENCOUNTER — Other Ambulatory Visit: Payer: Self-pay

## 2022-06-17 DIAGNOSIS — E7800 Pure hypercholesterolemia, unspecified: Secondary | ICD-10-CM | POA: Diagnosis not present

## 2022-06-17 DIAGNOSIS — F418 Other specified anxiety disorders: Secondary | ICD-10-CM | POA: Diagnosis not present

## 2022-06-17 DIAGNOSIS — E119 Type 2 diabetes mellitus without complications: Secondary | ICD-10-CM | POA: Diagnosis not present

## 2022-06-17 DIAGNOSIS — E559 Vitamin D deficiency, unspecified: Secondary | ICD-10-CM | POA: Diagnosis not present

## 2022-06-17 DIAGNOSIS — R600 Localized edema: Secondary | ICD-10-CM | POA: Diagnosis not present

## 2022-06-17 DIAGNOSIS — E6609 Other obesity due to excess calories: Secondary | ICD-10-CM | POA: Diagnosis not present

## 2022-06-17 DIAGNOSIS — Z6830 Body mass index (BMI) 30.0-30.9, adult: Secondary | ICD-10-CM | POA: Diagnosis not present

## 2022-06-17 DIAGNOSIS — E538 Deficiency of other specified B group vitamins: Secondary | ICD-10-CM | POA: Diagnosis not present

## 2022-06-17 LAB — HIV-1 RNA QUANT-NO REFLEX-BLD
HIV 1 RNA Quant: <20 Copies/mL — ABNORMAL HIGH
HIV-1 RNA Quant, Log: <1.3 Log cps/mL — ABNORMAL HIGH

## 2022-06-22 NOTE — Progress Notes (Unsigned)
Cardiology Office Note   Date:  06/23/2022   ID:  Omar, Woods 1965/06/05, MRN [id number]  PCP:  Asencion Noble, MD  Cardiologist:  Dr. Domenic Polite    Chief Complaint  Patient presents with   Coronary Artery Disease      History of Present Illness: [name] is a 57 y.o. male who presents for CAD.  PMH CAD (s/p DES to LCx in 2007, ISR and DES to LCx in 2009), HTN, HLD, Type 2 DM and HIV.  Last OV 08/19/21     He was admitted to [facility] in 05/2021 for DKA and AKI. His creatinine was elevated to 2.17 at the time of admission but had improved to 0.78 at the time of discharge.  Recent labs LDL of 186  HDL 56   Today no chest pain occ SOB.  He walks 2-3 times per week more so in summer walking with his dogs.  He tries on his diet and his glucose is up and down.  When his LDL was high he was not taking his crestor as he should.  He has not eaten today.   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   Syphilitic hepatitis    Type 2 diabetes mellitus ([place])    Vaccine counseling 06/14/2022    Past Surgical History:  Procedure Laterality Date   BIOPSY  12/28/2019   Procedure: BIOPSY;  Surgeon: Rogene Houston, MD;  [facility]: [facility];  Service: Endoscopy;;   CHOLECYSTECTOMY OPEN  07/2001   COLONOSCOPY N/A 05/28/2017   Procedure: COLONOSCOPY;  Surgeon: Rogene Houston, MD;  [facility]: [facility];  Service: Endoscopy;  Laterality: N/A;  200 - pt to prep in Endo   CORONARY ANGIOPLASTY WITH STENT PLACEMENT  03/2006; 10/2007;     ESOPHAGEAL DILATION N/A 12/28/2019   Procedure: ESOPHAGEAL DILATION;  Surgeon: Rogene Houston, MD;  [facility]: [facility];  Service: Endoscopy;  Laterality: N/A;   ESOPHAGOGASTRODUODENOSCOPY N/A 12/28/2019   Procedure: ESOPHAGOGASTRODUODENOSCOPY (EGD);  Surgeon: Rogene Houston, MD;  [facility]: [facility];  Service: Endoscopy;  Laterality: N/A;  125, per Lelon Frohlich moved to 10:55 pt aware   HERNIA REPAIR     LAPAROSCOPIC INCISIONAL / [place] / [place]  07/2001   UHR   POLYPECTOMY  05/28/2017   Procedure: POLYPECTOMY;  Surgeon: Rogene Houston, MD;  [facility]: [facility];  Service: Endoscopy;;  colon     Current Outpatient Medications  Medication Sig Dispense Refill   aspirin 81 MG tablet Take 1 tablet (81 mg total) by mouth daily with breakfast. 30 tablet 3   cabotegravir & rilpivirine ER (CABENUVA) 600 & 900 MG/3ML injection Inject 1 kit into the muscle every 2 (two) months. 6 mL 5   chlorthalidone (HYGROTON) 25 MG tablet Take 1 tablet (25 mg total) by mouth daily. 90 tablet 3   Continuous Blood Gluc Sensor (FREESTYLE LIBRE 14 DAY SENSOR) MISC Use as directed. 2 each  12   cyanocobalamin (VITAMIN B12) 1000 MCG/ML injection Inject 1 mL into the skin once weekly for 4 weeks. Then inject monthly as directed. 12 mL 2   cyanocobalamin (VITAMIN B12) 1000 MCG/ML injection Inject 33m (1007m) into the muscle once every 30 days 1 mL 2   escitalopram (LEXAPRO) 10 MG tablet Take 1 tablet (10 mg total) by mouth daily. 90 tablet 0   escitalopram (LEXAPRO) 10 MG tablet Take 1 tablet (10 mg total) by mouth daily. 90 tablet 0   ezetimibe (ZETIA) 10 MG tablet Take 1 tablet (10 mg total) by mouth daily. 90 tablet 3   insulin degludec (TRESIBA FLEXTOUCH) 100 UNIT/ML FlexTouch Pen INJECT 45 UNITS UNDER THE SKIN TWICE DAILY 30 mL 12   insulin degludec (TRESIBA) 100 UNIT/ML FlexTouch Pen Inject 45 units into the skin twice daily. 30 mL 12   Insulin Glargine (BASAGLAR KWIKPEN) 100 UNIT/ML Inject  45 Units into the skin 2 (two) times daily.     insulin lispro (HUMALOG KWIKPEN) 100 UNIT/ML KwikPen INJECT 35 UNITS 4 TIMES DAILY 90 mL 4   insulin lispro (HUMALOG KWIKPEN) 100 UNIT/ML KwikPen INJECT 35 UNITS 4 TIMES DAILY 90 mL 4   insulin lispro (HUMALOG) 100 UNIT/ML KwikPen Inject 35 units into the skin 4 times a day 90 mL 4   Insulin Pen Needle (TECHLITE PEN NEEDLES) 31G X 5 MM MISC Use as directed 150 each 12   Insulin Pen Needle (UNIFINE PENTIPS) 31G X 5 MM MISC Use as directed 150 each 4   Insulin Pen Needle 31G X 5 MM MISC Use as directed 150 each 4   lisinopril (ZESTRIL) 10 MG tablet TAKE 1 TABLET BY MOUTH EVERY DAY 90 tablet 3   LORazepam (ATIVAN) 0.5 MG tablet Take 1 tablet (0.5 mg total) by mouth 2 (two) times daily as needed. 30 tablet 3   mupirocin ointment (BACTROBAN) 2 % Apply topically twice daily 22 g 2   propranolol (INDERAL) 20 MG tablet Take 1 tablet (20 mg total) by mouth 2 (two) times daily. 180 tablet 3   propranolol (INDERAL) 20 MG tablet Take 1 tablet (20 mg total) by mouth 2 (two) times daily. 60 tablet 2   rosuvastatin (CRESTOR) 40 MG tablet Take 1 tablet by mouth daily. 90 tablet 3   SYRINGE-NEEDLE, DISP, 3 ML 25G X 1" 3 ML MISC Use to inject Vitamin B12 into the muscle once a month 10 each 0   tirzepatide (MOUNJARO) 15 MG/0.5ML Pen Inject 15 mg into the skin once a week. 2 mL 0   tirzepatide (MOUNJARO) 15 MG/0.5ML Pen Inject 15 mg into the skin once a week. 2 mL 0   ergocalciferol (VITAMIN D2) 1.25 MG (50000 UT) capsule Take 1 capsule by mouth once weekly (Patient not taking: Reported on 06/23/2022) 4 capsule 0   Multiple Vitamins-Minerals (MULTIVITAMIN WITH MINERALS) tablet Take 1 tablet by mouth daily. (Patient not taking: Reported on 06/23/2022)     tirzepatide ([facility] MG/0.5ML Pen Inject 10 mg into the skin once a week. (Patient not taking: Reported on 06/23/2022) 2 mL 0   tirzepatide (MOUNJARO) 12.5 MG/0.5ML Pen Inject 12.5 mg into the skin once a week.  (Patient not taking: Reported on 06/23/2022) 2 mL 2   tirzepatide (MOUNJARO) 7.5 MG/0.5ML Pen Inject 7.5 mg into the skin once a week (Patient not taking: Reported on 06/23/2022) 2 mL 1   Vitamin D, Ergocalciferol, (DRISDOL) 1.25 MG (50000 UNIT) CAPS capsule Take 1 capsule by mouth 2 times  a week on Sunday & Wednesday (Patient not taking: Reported on 06/23/2022) 8 capsule 0   No current facility-administered medications for this visit.    Allergies:   Famotidine, Prilosec [omeprazole], and Odefsey [emtricitabine-rilpivirine-tenofovir af]    Social History:  The patient  reports that he has never smoked. He has never used smokeless tobacco. He reports that he does not currently use alcohol. He reports that he does not use drugs.   Family History:  The patient's family history includes Alcoholism in his father; Anemia in an other family member; CAD in his father and sister; Diabetes in his sister and another family member; Heart attack in his father; Hyperlipidemia in his mother; Hypertension in his mother.    ROS:  General:no colds or fevers, no weight changes Skin:no rashes or ulcers HEENT:no blurred vision, no congestion, had teeth pulled and ready for permanent  CV:see HPI PUL:see HPI GI:no diarrhea constipation or melena, no indigestion GU:no hematuria, no dysuria MS:no joint pain, no claudication Neuro:no syncope, no lightheadedness Endo:+ diabetes, no thyroid disease  Wt Readings from Last 3 Encounters:  06/23/22 194 lb (88 kg)  06/15/22 198 lb (89.8 kg)  12/08/21 192 lb (87.1 kg)     PHYSICAL EXAM: VS:  BP 118/72   Pulse 97   Ht '5\' 6"'$  (1.676 m)   Wt 194 lb (88 kg)   SpO2 95%   BMI 31.31 kg/m  , BMI Body mass index is 31.31 kg/m. General:Pleasant affect, NAD Skin:Warm and dry, brisk capillary refill HEENT:normocephalic, sclera clear, mucus membranes moist Neck:supple, no JVD, no bruits  Heart:S1S2 RRR without murmur, gallup, rub or click Lungs:clear without rales,  rhonchi, or wheezes WLN:LGXQ, non tender, + BS, do not palpate liver spleen or masses Ext:no lower ext edema, 2+ pedal pulses, 2+ radial pulses Neuro:alert and oriented X 3, MAE, follows commands, + facial symmetry    EKG:  EKG is ordered today. The ekg ordered today demonstrates SR normal EKG stable.   Recent Labs: No results found for requested labs within last 365 days.    Lipid Panel    Component Value Date/Time   CHOL 271 (H) 04/13/2022 0925   CHOL 138 04/21/2021 0817   TRIG 148 04/13/2022 0925   HDL 56 04/13/2022 0925   HDL 38 (L) 04/21/2021 0817   CHOLHDL 4.8 04/13/2022 0925   VLDL 59 (H) 10/28/2015 0957   LDLCALC 186 (H) 04/13/2022 1194       Other studies Reviewed: Additional studies/ records that were reviewed today include:  TTE 2013 .Study Conclusions   - Left ventricle: The cavity size was normal. Wall thickness    was normal. Systolic function was normal. The estimated    ejection fraction was in the range of 55% to 60%. Wall    motion was normal; there were no regional wall motion    abnormalities. Features are consistent with a pseudonormal    left ventricular filling pattern, with concomitant    abnormal relaxation and increased filling pressure (grade    2 diastolic dysfunction).  - Atrial septum: No defect or patent foramen ovale was    identified.   ------------------------------------------------------------  Labs, prior tests, procedures, and surgery:  Catheterization (2009).    There was a stenosis which was  treated with a stent.   Cardiac cath 11/09/07 . Ventriculography was done in the RAO projection.  Overall systolic      function was preserved.  No segmental abnormalities or contractions      were identified.  2. The left coronary artery.  Specifically the LAD comes off      independently.  The LAD has a proximal diagonal branch, and there      is a tiny partial circ vessel that appears to come off of this, the      LAD courses  them to the apex overlapping the takeoff of the      proximal diagonal is an area of approximately 20-30% luminal      reduction, but otherwise there is no evidence of high-grade      disease.  3. The right coronary artery is a large-caliber vessel with posterior      descending and a bifurcating posterolateral branch.  No critical      stenoses are noted.  4. 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  At the ostium, there is      perhaps up to 50% narrowing, but after intracoronary nitroglycerin,      this clearly appears to be less impressive.  It almost always      occurs with a guide engaged.  There is some segmental plaque      leading into the previously stented area.  The stented area has a      focal in-stent restenosis of about 90%.  Following percutaneous      stenting, this is reduced to 0%.  Distal to this, the vessel opens      up and provides 4 large marginal branches.    CONCLUSION:  1. Well-preserved left ventricular function.  2. High-grade in-stent restenosis of previously placed stent in the      anomalous circumflex coronary artery with subsequent percutaneous      stenting using a PROMUS drug-eluting platform.  3. Insulin dependent diabetes mellitus.    DISPOSITION:  The patient be treated medically.  Continuation on aspirin  and Plavix will be warranted.  Optimization of diabetic status is  warranted also to try to prevent recurrent coronary events.    Cardiac cath 05/14/06  CONCLUSIONS:  1. 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  2. Insulin dependent diabetes mellitus.    DISPOSITION:  The patient is at high risk for recurrent coronary events.  Aggressive risk factor reduction is recommended.  Aspirin and Plavix for  minimum of one year and possibly longer will be recommended with a  duration to be determined by the patient's cardiologist.       ASSESSMENT AND PLAN:  1.  CAD with prior stents review of caths and  echo.  Stent to LCX in 2007 and DES to lcx in 2009.  Continue ASA, propranolol (uses for anxiety as well)  statin continue  no chest pain, feels well.  If he develops any he will call.  Follow up in 1 year unless he has any symptoms.   2.  HLD with LDL 180 in Nov, was taking well and he is now will recheck CMP and lipids today, he is npo.  Continue crestor and zetia  reviewed labs from hospitalization in Nov.    3.  HTN well controlled on BB, ACE and chlorthalidone. Continue. Will check BMP with AKI in Nov.   4.  DM-2 followed by PCP , encouraged walking.    Current medicines are reviewed with the patient today.  The patient Has no concerns regarding medicines.  The following changes have been made:  See above Labs/ tests ordered today include:see above  Disposition:   FU:  see above  Signed, Cecilie Kicks, NP  06/23/2022 1:03 PM    [facility] [address], [place], [place] Phone: [phone]; Fax: [phone]

## 2022-06-23 ENCOUNTER — Encounter: Payer: Self-pay | Admitting: Cardiology

## 2022-06-23 ENCOUNTER — Other Ambulatory Visit (HOSPITAL_COMMUNITY)
Admission: RE | Admit: 2022-06-23 | Discharge: 2022-06-23 | Disposition: A | Discharge status: Home / Self Care | Payer: 59 | Source: Ambulatory Visit | Attending: Cardiology | Admitting: Cardiology

## 2022-06-23 ENCOUNTER — Ambulatory Visit: Payer: 59 | Attending: Cardiology | Admitting: Cardiology

## 2022-06-23 VITALS — BP 118/72 | HR 97 | Ht 66.0 in | Wt 194.0 lb

## 2022-06-23 DIAGNOSIS — I10 Essential (primary) hypertension: Secondary | ICD-10-CM | POA: Insufficient documentation

## 2022-06-23 DIAGNOSIS — I2510 Atherosclerotic heart disease of native coronary artery without angina pectoris: Secondary | ICD-10-CM

## 2022-06-23 DIAGNOSIS — E782 Mixed hyperlipidemia: Secondary | ICD-10-CM | POA: Diagnosis not present

## 2022-06-23 DIAGNOSIS — E118 Type 2 diabetes mellitus with unspecified complications: Secondary | ICD-10-CM | POA: Diagnosis not present

## 2022-06-23 LAB — COMPREHENSIVE METABOLIC PANEL
ALT: 30 U/L (ref 0–44)
AST: 27 U/L (ref 15–41)
Albumin: 3.9 g/dL (ref 3.5–5.0)
Alkaline Phosphatase: 60 U/L (ref 38–126)
Anion gap: 12 (ref 5–15)
BUN: 31 mg/dL — ABNORMAL HIGH (ref 6–20)
CO2: 21 mmol/L — ABNORMAL LOW (ref 22–32)
Calcium: 9 mg/dL (ref 8.9–10.3)
Chloride: 103 mmol/L (ref 98–111)
Creatinine, Ser: 0.86 mg/dL (ref 0.61–1.24)
GFR, Estimated: >60 mL/min (ref >60)
Glucose, Bld: 152 mg/dL — ABNORMAL HIGH (ref 70–99)
Potassium: 3.6 mmol/L (ref 3.5–5.1)
Sodium: 136 mmol/L (ref 135–145)
Total Bilirubin: 0.9 mg/dL (ref 0.3–1.2)
Total Protein: 7.1 g/dL (ref 6.5–8.1)

## 2022-06-23 LAB — LIPID PANEL
Cholesterol: 116 mg/dL (ref 0–200)
HDL: 42 mg/dL (ref >40)
LDL Cholesterol: 43 mg/dL (ref 0–99)
Total CHOL/HDL Ratio: 2.8 RATIO
Triglycerides: 154 mg/dL — ABNORMAL HIGH (ref <150)
VLDL: 31 mg/dL (ref 0–40)

## 2022-06-23 NOTE — Patient Instructions (Signed)
Medication Instructions:  Your physician recommends that you continue on your current medications as directed. Please refer to the Current Medication list given to you today.  *If you need a refill on your cardiac medications before your next appointment, please call your pharmacy*   Lab Work: Your physician recommends that you return for lab work in: Today ( [place], Lipid)   If you have labs (blood work) drawn today and your tests are completely normal, you will receive your results only by: [place] (if you have MyChart) OR A paper copy in the mail If you have any lab test that is abnormal or we need to change your treatment, we will call you to review the results.   Testing/Procedures: NONE    Follow-Up: At [facility], you and your health needs are our priority.  As part of our continuing mission to provide you with exceptional heart care, we have created designated Provider Care Teams.  These Care Teams include your primary Cardiologist (physician) and Advanced Practice Providers (APPs -  Physician Assistants and Nurse Practitioners) who all work together to provide you with the care you need, when you need it.  We recommend signing up for the patient portal called "MyChart".  Sign up information is provided on this After Visit Summary.  MyChart is used to connect with patients for Virtual Visits (Telemedicine).  Patients are able to view lab/test results, encounter notes, upcoming appointments, etc.  Non-urgent messages can be sent to your provider as well.   To learn more about what you can do with MyChart, go to [email or web address].    Your next appointment:   1 year(s)  Provider:   Rozann Lesches, MD    Other Instructions Thank you for choosing [facility]!

## 2022-06-24 ENCOUNTER — Telehealth: Payer: Self-pay | Admitting: Cardiology

## 2022-06-24 NOTE — Telephone Encounter (Signed)
Patient returning call for lab results. 

## 2022-06-24 NOTE — Telephone Encounter (Signed)
-----  Message from Isaiah Serge, NP sent at 06/23/2022  5:17 PM EST ----- Labs stable and LDL is much better at 43.   Cecilie Kicks, FNP-C

## 2022-06-24 NOTE — Telephone Encounter (Signed)
Patient notified and verbalized understanding. Patient had no questions or concerns at this time. PCP copied 

## 2022-07-01 ENCOUNTER — Other Ambulatory Visit: Payer: Self-pay

## 2022-07-01 ENCOUNTER — Other Ambulatory Visit (HOSPITAL_COMMUNITY): Payer: Self-pay

## 2022-07-02 ENCOUNTER — Other Ambulatory Visit (HOSPITAL_COMMUNITY): Payer: Self-pay

## 2022-07-02 ENCOUNTER — Other Ambulatory Visit: Payer: Self-pay

## 2022-07-06 ENCOUNTER — Other Ambulatory Visit: Payer: Self-pay

## 2022-07-06 DIAGNOSIS — R112 Nausea with vomiting, unspecified: Secondary | ICD-10-CM | POA: Diagnosis not present

## 2022-07-21 DIAGNOSIS — F418 Other specified anxiety disorders: Secondary | ICD-10-CM | POA: Diagnosis not present

## 2022-07-21 DIAGNOSIS — Z794 Long term (current) use of insulin: Secondary | ICD-10-CM | POA: Diagnosis not present

## 2022-07-21 DIAGNOSIS — Z8639 Personal history of other endocrine, nutritional and metabolic disease: Secondary | ICD-10-CM | POA: Diagnosis not present

## 2022-07-21 DIAGNOSIS — G4489 Other headache syndrome: Secondary | ICD-10-CM | POA: Diagnosis not present

## 2022-07-21 DIAGNOSIS — E663 Overweight: Secondary | ICD-10-CM | POA: Diagnosis not present

## 2022-07-21 DIAGNOSIS — Z6829 Body mass index (BMI) 29.0-29.9, adult: Secondary | ICD-10-CM | POA: Diagnosis not present

## 2022-07-21 DIAGNOSIS — E1169 Type 2 diabetes mellitus with other specified complication: Secondary | ICD-10-CM | POA: Diagnosis not present

## 2022-07-22 ENCOUNTER — Other Ambulatory Visit (HOSPITAL_COMMUNITY): Payer: Self-pay

## 2022-07-22 MED ORDER — BACLOFEN 10 MG PO TABS
10.0000 mg | ORAL_TABLET | Freq: Every evening | ORAL | 0 refills | Status: DC | PRN
  Filled 2022-07-22 (×2): qty 30, 30d supply, fill #0

## 2022-07-29 ENCOUNTER — Other Ambulatory Visit: Payer: Self-pay

## 2022-07-29 ENCOUNTER — Other Ambulatory Visit (HOSPITAL_COMMUNITY): Payer: Self-pay

## 2022-07-31 ENCOUNTER — Other Ambulatory Visit: Payer: Self-pay

## 2022-07-31 ENCOUNTER — Other Ambulatory Visit (HOSPITAL_COMMUNITY): Payer: Self-pay

## 2022-08-01 ENCOUNTER — Other Ambulatory Visit (HOSPITAL_COMMUNITY): Payer: Self-pay

## 2022-08-03 ENCOUNTER — Other Ambulatory Visit: Payer: Self-pay

## 2022-08-03 ENCOUNTER — Other Ambulatory Visit (HOSPITAL_COMMUNITY): Payer: Self-pay

## 2022-08-03 MED ORDER — MOUNJARO 15 MG/0.5ML ~~LOC~~ SOAJ
15.0000 mg | SUBCUTANEOUS | 0 refills | Status: DC
  Filled 2022-08-03: qty 2, 28d supply, fill #0

## 2022-08-04 ENCOUNTER — Other Ambulatory Visit: Payer: Self-pay

## 2022-08-05 ENCOUNTER — Other Ambulatory Visit: Payer: Self-pay

## 2022-08-07 ENCOUNTER — Other Ambulatory Visit (HOSPITAL_COMMUNITY): Payer: Self-pay

## 2022-08-10 ENCOUNTER — Other Ambulatory Visit: Payer: Self-pay

## 2022-08-10 ENCOUNTER — Other Ambulatory Visit (HOSPITAL_COMMUNITY): Payer: Self-pay

## 2022-08-11 ENCOUNTER — Other Ambulatory Visit (HOSPITAL_COMMUNITY): Payer: Self-pay | Admitting: Internal Medicine

## 2022-08-11 ENCOUNTER — Other Ambulatory Visit (HOSPITAL_COMMUNITY): Payer: Self-pay

## 2022-08-11 DIAGNOSIS — G44009 Cluster headache syndrome, unspecified, not intractable: Secondary | ICD-10-CM | POA: Diagnosis not present

## 2022-08-11 DIAGNOSIS — R413 Other amnesia: Secondary | ICD-10-CM

## 2022-08-11 DIAGNOSIS — R519 Headache, unspecified: Secondary | ICD-10-CM | POA: Diagnosis not present

## 2022-08-13 ENCOUNTER — Other Ambulatory Visit (HOSPITAL_COMMUNITY): Payer: Self-pay

## 2022-08-14 ENCOUNTER — Telehealth: Payer: Self-pay

## 2022-08-14 NOTE — Telephone Encounter (Signed)
[facility] Patient Advocate Encounter  Patient's medication [name]) have been couriered to [facility] from [place] and will be administered on the patient next office visit on 08/17/22.  [name] , [facility] Phone: [phone] Fax:  [phone]

## 2022-08-17 ENCOUNTER — Other Ambulatory Visit: Payer: Self-pay

## 2022-08-17 ENCOUNTER — Ambulatory Visit (INDEPENDENT_AMBULATORY_CARE_PROVIDER_SITE_OTHER): Payer: 59 | Admitting: Pharmacist

## 2022-08-17 ENCOUNTER — Other Ambulatory Visit (HOSPITAL_COMMUNITY): Payer: Self-pay

## 2022-08-17 DIAGNOSIS — B20 Human immunodeficiency virus [HIV] disease: Secondary | ICD-10-CM | POA: Diagnosis not present

## 2022-08-17 MED ORDER — CABOTEGRAVIR & RILPIVIRINE ER 600 & 900 MG/3ML IM SUER
1.0000 | Freq: Once | INTRAMUSCULAR | Status: AC
  Administered 2022-08-17: 1 via INTRAMUSCULAR

## 2022-08-17 NOTE — Progress Notes (Signed)
HPI: [name] is a 57 y.o. male who presents to the [facility] for [place] administration.  Patient Active Problem List   Diagnosis Date Noted   Vaccine counseling 06/14/2022   Thrombocytosis 05/28/2021   DKA (diabetic ketoacidosis) ([place]) 03/05/2020   High anion gap metabolic acidosis [id number]   Leukocytosis 03/05/2020   Dehydration 03/05/2020   Hyponatremia 03/05/2020   Hyperkalemia 03/05/2020   Hyperglycemia due to diabetes mellitus ([place]) 03/05/2020   Intractable nausea and vomiting 03/05/2020   DKA, type 2 ([place]) 03/05/2020   Other fatigue 03/14/2018   Shortness of breath on exertion 03/14/2018   Uncontrolled type 2 diabetes mellitus with hyperglycemia ([place]) 02/08/2018   Mixed hyperlipidemia 02/08/2018   AKI (acute kidney injury) ([place]) 03/23/2017   Special screening for malignant neoplasms, colon 11/18/2016   Family history of colon cancer 11/18/2016   Lung nodule, multiple 09/27/2014   Poorly controlled type 2 diabetes mellitus with circulatory disorder ([place]) 09/27/2014   Early syphilis, 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   Hypercholesterolemia 01/25/2013   Ejection fraction    Essential hypertension, benign    Human immunodeficiency virus (HIV) disease ([place]) 09/11/2011   Kidney stone on left side 01/04/2011   DKA (diabetic ketoacidoses) 01/04/2011   Coronary artery disease due to lipid rich plaque    Secondary syphilis    LFTs abnormal     Patient's Medications  New Prescriptions   No medications on file  Previous Medications   ASPIRIN 81 MG TABLET    Take 1 tablet (81 mg total) by mouth daily with breakfast.   BACLOFEN (LIORESAL) 10 MG TABLET    Take 1 tablet (10 mg total) by mouth at bedtime as needed.   CABOTEGRAVIR & RILPIVIRINE ER (CABENUVA) 600 & 900 MG/3ML INJECTION    Inject 1 kit into the muscle every 2 (two) months.   CHLORTHALIDONE (HYGROTON) 25 MG TABLET    Take 1 tablet (25 mg total) by mouth daily.   CONTINUOUS BLOOD GLUC SENSOR  (FREESTYLE LIBRE 14 DAY SENSOR) MISC    Use as directed. Change every 14 days   CYANOCOBALAMIN (VITAMIN B12) 1000 MCG/ML INJECTION    Inject 1 mL into the skin once weekly for 4 weeks. Then inject monthly as directed.   CYANOCOBALAMIN (VITAMIN B12) 1000 MCG/ML INJECTION    Inject 89ml (1027mcg) into the muscle once every 30 days   ERGOCALCIFEROL (VITAMIN D2) 1.25 MG (50000 UT) CAPSULE    Take 1 capsule by mouth once weekly   ESCITALOPRAM (LEXAPRO) 10 MG TABLET    Take 1 tablet (10 mg total) by mouth daily.   ESCITALOPRAM (LEXAPRO) 10 MG TABLET    Take 1 tablet (10 mg total) by mouth daily.   EZETIMIBE (ZETIA) 10 MG TABLET    Take 1 tablet (10 mg total) by mouth daily.   INSULIN DEGLUDEC (TRESIBA FLEXTOUCH) 100 UNIT/ML FLEXTOUCH PEN    INJECT 45 UNITS UNDER THE SKIN TWICE DAILY   INSULIN DEGLUDEC (TRESIBA) 100 UNIT/ML FLEXTOUCH PEN    Inject 45 units into the skin twice daily.   INSULIN GLARGINE (BASAGLAR KWIKPEN) 100 UNIT/ML    Inject 45 Units into the skin 2 (two) times daily.   INSULIN LISPRO (HUMALOG KWIKPEN) 100 UNIT/ML KWIKPEN    INJECT 35 UNITS 4 TIMES DAILY   INSULIN LISPRO (HUMALOG KWIKPEN) 100 UNIT/ML KWIKPEN    INJECT 35 UNITS 4 TIMES DAILY   INSULIN LISPRO (HUMALOG) 100 UNIT/ML KWIKPEN    Inject 35 units into the skin 4 times  a day   INSULIN PEN NEEDLE (TECHLITE PEN NEEDLES) 31G X 5 MM MISC    Use as directed   INSULIN PEN NEEDLE (UNIFINE PENTIPS) 31G X 5 MM MISC    Use as directed   INSULIN PEN NEEDLE 31G X 5 MM MISC    Use as directed   LISINOPRIL (ZESTRIL) 10 MG TABLET    TAKE 1 TABLET BY MOUTH EVERY DAY   LORAZEPAM (ATIVAN) 0.5 MG TABLET    Take 1 tablet (0.5 mg total) by mouth 2 (two) times daily as needed.   MULTIPLE VITAMINS-MINERALS (MULTIVITAMIN WITH MINERALS) TABLET    Take 1 tablet by mouth daily.   MUPIROCIN OINTMENT (BACTROBAN) 2 %    Apply topically twice daily   PROPRANOLOL (INDERAL) 20 MG TABLET    Take 1 tablet (20 mg total) by mouth 2 (two) times daily.   PROPRANOLOL  (INDERAL) 20 MG TABLET    Take 1 tablet (20 mg total) by mouth 2 (two) times daily.   ROSUVASTATIN (CRESTOR) 40 MG TABLET    Take 1 tablet by mouth daily.   SYRINGE-NEEDLE, DISP, 3 ML 25G X 1" 3 ML MISC    Use to inject Vitamin B12 into the muscle once a month   TIRZEPATIDE (MOUNJARO) 10 MG/0.5ML PEN    Inject 10 mg into the skin once a week.   TIRZEPATIDE (MOUNJARO) 12.5 MG/0.5ML PEN    Inject 12.5 mg into the skin once a week.   TIRZEPATIDE (MOUNJARO) 15 MG/0.5ML PEN    Inject 15 mg into the skin once a week.   TIRZEPATIDE (MOUNJARO) 15 MG/0.5ML PEN    Inject 15 mg into the skin once a week.   TIRZEPATIDE (MOUNJARO) 7.5 MG/0.5ML PEN    Inject 7.5 mg into the skin once a week   VITAMIN D, ERGOCALCIFEROL, (DRISDOL) 1.25 MG (50000 UNIT) CAPS CAPSULE    Take 1 capsule by mouth 2 times a week on Sunday & Wednesday  Modified Medications   No medications on file  Discontinued Medications   No medications on file    Allergies: Allergies  Allergen Reactions   Famotidine Other (See Comments)    Contraindicated with ODEFSEY Other reaction(s): Other (See Comments) Contraindicated with ODEFSEY   Prilosec [Omeprazole] Other (See Comments)    Contraindicated with RPV (lowers level of this ARV in [place] [Emtricitabine-Rilpivirine-Tenofovir Af]     Makes blood sugars run high    Past Medical History: Past Medical History:  Diagnosis Date   CAD (coronary artery disease)    DES to anomalous circumflex 2007; DES due to ISR anomalous circumflex 2009   Diabetes mellitus type II    Essential hypertension    History of heart attack    History of kidney stones    HIV (human immunodeficiency virus infection) ([place])    Diagnosed 2002   LFTs abnormal    Mixed hyperlipidemia    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  Tobacco Use  Smoking status: Never   Smokeless tobacco: Never  Vaping Use   Vaping Use: Never used  Substance and Sexual Activity   Alcohol use: Not Currently    Alcohol/week: 0.0 - 1.0 standard drinks of alcohol    Comment: Social drinker, vodka, once a month   Drug use: No   Sexual activity: Not Currently    Partners: Male    Comment: declined condoms  Other Topics Concern   Not on file  Social History Narrative   Not on file   Social Determinants of Health   Financial Resource Strain: Not on file  Food Insecurity: Not on file  Transportation Needs: Not on file  Physical Activity: Not on file  Stress: Not on file  Social Connections: Not on file    Labs: Lab Results  Component Value Date   HIV1RNAQUANT <20 (H) 06/15/2022   HIV1RNAQUANT <20 (H) 04/13/2022   HIV1RNAQUANT NOT DETECTED 10/21/2021   CD4TABS 904 04/13/2022   CD4TABS 897 03/18/2021   CD4TABS 1,066 03/18/2020    RPR and STI Lab Results  Component Value Date   LABRPR NON-REACTIVE 08/18/2021   LABRPR NON-REACTIVE 03/18/2020   LABRPR NON-REACTIVE 03/14/2019   LABRPR NON-REACTIVE 02/22/2018   LABRPR NON REAC 11/30/2016    STI Results GC CT  08/18/2021  9:21 AM Negative    Negative    Negative  Negative    Negative    Negative   02/22/2018 12:00 AM Negative  Negative   11/30/2016 12:00 AM Negative  Negative   10/28/2015 12:00 AM Negative  Negative   09/13/2014 12:00 AM Negative  Negative     Hepatitis B Lab Results  Component Value Date   HEPBSAB REACTIVE (A) 08/18/2021   HEPBSAG NON-REACTIVE 08/18/2021   Hepatitis C No results found for: "HEPCAB", "HCVRNAPCRQN" Hepatitis A Lab  Results  Component Value Date   HAV NON-REACTIVE 08/18/2021   Lipids: Lab Results  Component Value Date   CHOL 116 06/23/2022   TRIG 154 (H) 06/23/2022   HDL 42 06/23/2022   CHOLHDL 2.8 06/23/2022   VLDL 31 06/23/2022   LDLCALC 43 06/23/2022    TARGET DATE: The 25th  Assessment: [name] presents today for his maintenance Cabenuva injections. Past injections were tolerated well without issues. Will defer HIV RNA testing to his next appointment as this lab was collected during his last appointment in January, 2024 and has remained undetectable. The patient politely declines STI testing today. He is up to date on vaccines at this time--he reports receiving the annual flu shot and Mpox vaccines. Will follow-up with Dr. Tommy Medal in May and with Cassie again in July.   Administered cabotegravir 600mg /57mL in left upper outer quadrant of the gluteal muscle. Administered rilpivirine 900 mg/50mL in the right upper outer quadrant of the gluteal muscle. No issues with injections. He will follow up in 2 months for next set of injections.  Plan: - Cabenuva injections administered - Next injections scheduled for 10/15/22 with Dr. Tommy Medal and 12/14/22 with Cassie - Call with any issues or questions   Billey Gosling, PharmD PGY1 Pharmacy Resident 3/25/20249:01 AM

## 2022-08-21 ENCOUNTER — Other Ambulatory Visit (HOSPITAL_COMMUNITY): Payer: Self-pay

## 2022-08-30 ENCOUNTER — Other Ambulatory Visit: Payer: Self-pay | Admitting: Student

## 2022-08-31 ENCOUNTER — Other Ambulatory Visit (HOSPITAL_COMMUNITY): Payer: Self-pay

## 2022-08-31 ENCOUNTER — Other Ambulatory Visit: Payer: Self-pay

## 2022-08-31 MED ORDER — CHLORTHALIDONE 25 MG PO TABS
25.0000 mg | ORAL_TABLET | Freq: Every day | ORAL | 3 refills | Status: DC
  Filled 2022-08-31: qty 90, 90d supply, fill #0
  Filled 2022-11-30: qty 90, 90d supply, fill #1
  Filled 2023-03-03: qty 90, 90d supply, fill #2
  Filled 2023-05-27: qty 90, 90d supply, fill #3

## 2022-09-01 ENCOUNTER — Other Ambulatory Visit: Payer: Self-pay

## 2022-09-01 ENCOUNTER — Other Ambulatory Visit (HOSPITAL_COMMUNITY): Payer: Self-pay

## 2022-09-03 ENCOUNTER — Other Ambulatory Visit: Payer: Self-pay

## 2022-09-07 ENCOUNTER — Ambulatory Visit (HOSPITAL_COMMUNITY)
Admission: RE | Admit: 2022-09-07 | Discharge: 2022-09-07 | Disposition: A | Discharge status: Home / Self Care | Payer: 59 | Source: Ambulatory Visit | Attending: Internal Medicine | Admitting: Internal Medicine

## 2022-09-07 DIAGNOSIS — R413 Other amnesia: Secondary | ICD-10-CM

## 2022-09-07 DIAGNOSIS — G44009 Cluster headache syndrome, unspecified, not intractable: Secondary | ICD-10-CM | POA: Diagnosis not present

## 2022-09-07 DIAGNOSIS — R519 Headache, unspecified: Secondary | ICD-10-CM | POA: Diagnosis not present

## 2022-09-07 MED ORDER — GADOBUTROL 1 MMOL/ML IV SOLN
10.0000 mL | Freq: Once | INTRAVENOUS | Status: AC | PRN
  Administered 2022-09-07: 10 mL via INTRAVENOUS

## 2022-09-15 DIAGNOSIS — F418 Other specified anxiety disorders: Secondary | ICD-10-CM | POA: Diagnosis not present

## 2022-09-15 DIAGNOSIS — Z9189 Other specified personal risk factors, not elsewhere classified: Secondary | ICD-10-CM | POA: Diagnosis not present

## 2022-09-15 DIAGNOSIS — F5081 Binge eating disorder: Secondary | ICD-10-CM | POA: Diagnosis not present

## 2022-09-15 DIAGNOSIS — E1169 Type 2 diabetes mellitus with other specified complication: Secondary | ICD-10-CM | POA: Diagnosis not present

## 2022-09-15 DIAGNOSIS — Z6829 Body mass index (BMI) 29.0-29.9, adult: Secondary | ICD-10-CM | POA: Diagnosis not present

## 2022-09-15 DIAGNOSIS — G4489 Other headache syndrome: Secondary | ICD-10-CM | POA: Diagnosis not present

## 2022-09-15 DIAGNOSIS — E663 Overweight: Secondary | ICD-10-CM | POA: Diagnosis not present

## 2022-09-15 DIAGNOSIS — Z794 Long term (current) use of insulin: Secondary | ICD-10-CM | POA: Diagnosis not present

## 2022-09-15 DIAGNOSIS — Z8639 Personal history of other endocrine, nutritional and metabolic disease: Secondary | ICD-10-CM | POA: Diagnosis not present

## 2022-09-30 ENCOUNTER — Other Ambulatory Visit: Payer: Self-pay

## 2022-09-30 ENCOUNTER — Other Ambulatory Visit (HOSPITAL_COMMUNITY): Payer: Self-pay

## 2022-10-01 ENCOUNTER — Other Ambulatory Visit: Payer: Self-pay

## 2022-10-01 ENCOUNTER — Other Ambulatory Visit (HOSPITAL_COMMUNITY): Payer: Self-pay

## 2022-10-02 ENCOUNTER — Other Ambulatory Visit: Payer: Self-pay

## 2022-10-02 ENCOUNTER — Other Ambulatory Visit (HOSPITAL_COMMUNITY): Payer: Self-pay

## 2022-10-02 MED ORDER — ESCITALOPRAM OXALATE 10 MG PO TABS
10.0000 mg | ORAL_TABLET | Freq: Every day | ORAL | 0 refills | Status: DC
  Filled 2022-10-02: qty 90, 90d supply, fill #0

## 2022-10-06 ENCOUNTER — Other Ambulatory Visit (HOSPITAL_COMMUNITY): Payer: Self-pay

## 2022-10-07 ENCOUNTER — Other Ambulatory Visit (HOSPITAL_COMMUNITY): Payer: Self-pay

## 2022-10-07 ENCOUNTER — Other Ambulatory Visit: Payer: Self-pay

## 2022-10-08 ENCOUNTER — Telehealth: Payer: Self-pay

## 2022-10-08 ENCOUNTER — Other Ambulatory Visit (HOSPITAL_COMMUNITY): Payer: Self-pay

## 2022-10-08 NOTE — Telephone Encounter (Signed)
[facility] Patient Advocate Encounter  Patient's medication Renaldo Harrison) have been couriered to [facility] from [facility] [employer] [facility] and will be administered on the patient next office visit on 10/15/22.  Clearance Coots , CPhT Specialty Pharmacy Patient [facility] for Infectious Disease Phone: [phone] Fax:  [phone]

## 2022-10-09 ENCOUNTER — Other Ambulatory Visit: Payer: Self-pay

## 2022-10-12 ENCOUNTER — Other Ambulatory Visit (HOSPITAL_COMMUNITY): Payer: Self-pay

## 2022-10-13 ENCOUNTER — Other Ambulatory Visit: Payer: Self-pay

## 2022-10-13 ENCOUNTER — Other Ambulatory Visit (HOSPITAL_COMMUNITY): Payer: Self-pay

## 2022-10-13 MED ORDER — TECHLITE PEN NEEDLES 31G X 8 MM MISC
12 refills | Status: DC
  Filled 2022-10-13: qty 500, 83d supply, fill #0
  Filled 2023-01-26: qty 500, 83d supply, fill #1
  Filled 2023-04-21: qty 500, 83d supply, fill #2
  Filled 2023-07-08: qty 500, 83d supply, fill #3

## 2022-10-15 ENCOUNTER — Other Ambulatory Visit (HOSPITAL_COMMUNITY): Payer: Self-pay

## 2022-10-15 ENCOUNTER — Other Ambulatory Visit (HOSPITAL_COMMUNITY)
Admission: RE | Admit: 2022-10-15 | Discharge: 2022-10-15 | Disposition: A | Discharge status: Home / Self Care | Payer: 59 | Source: Ambulatory Visit | Attending: Infectious Disease | Admitting: Infectious Disease

## 2022-10-15 ENCOUNTER — Ambulatory Visit: Payer: 59 | Admitting: Infectious Disease

## 2022-10-15 ENCOUNTER — Encounter: Payer: Self-pay | Admitting: Infectious Disease

## 2022-10-15 ENCOUNTER — Other Ambulatory Visit: Payer: Self-pay

## 2022-10-15 VITALS — BP 137/84 | HR 79 | Resp 16 | Ht 66.0 in | Wt 193.4 lb

## 2022-10-15 DIAGNOSIS — R5383 Other fatigue: Secondary | ICD-10-CM | POA: Diagnosis not present

## 2022-10-15 DIAGNOSIS — B20 Human immunodeficiency virus [HIV] disease: Secondary | ICD-10-CM | POA: Diagnosis not present

## 2022-10-15 DIAGNOSIS — Z7185 Encounter for immunization safety counseling: Secondary | ICD-10-CM | POA: Diagnosis not present

## 2022-10-15 DIAGNOSIS — Z23 Encounter for immunization: Secondary | ICD-10-CM

## 2022-10-15 DIAGNOSIS — I10 Essential (primary) hypertension: Secondary | ICD-10-CM

## 2022-10-15 DIAGNOSIS — I25110 Atherosclerotic heart disease of native coronary artery with unstable angina pectoris: Secondary | ICD-10-CM | POA: Diagnosis not present

## 2022-10-15 LAB — CBC WITH DIFFERENTIAL/PLATELET
Eosinophils Absolute: 358 cells/uL (ref 15–500)
MCHC: 32.6 g/dL (ref 32.0–36.0)
RBC: 5.11 10*6/uL (ref 4.20–5.80)
RDW: 11.9 % (ref 11.0–15.0)
Total Lymphocyte: 40.5 %

## 2022-10-15 MED ORDER — CABOTEGRAVIR & RILPIVIRINE ER 600 & 900 MG/3ML IM SUER
1.0000 | Freq: Once | INTRAMUSCULAR | Status: AC
Start: 2022-10-15 — End: 2022-10-15
  Administered 2022-10-15: 1 via INTRAMUSCULAR

## 2022-10-15 NOTE — Progress Notes (Signed)
Subjective:  Chief complaint: followup for HIV disease on Cabenuva dealing with stressor of losing his sister and caring for his Mom   Patient ID: [name], male    DOB: [birth year], 57 y.o.   MRN: [id number]  HPI  [name] is a 57 year-old Caucasian man who has HIV infection that has been peripheral controlled on [place] and now [place].  He does have comorbid insulin-dependent diabetes mellitus coronary artery disease hyperlipidemia and obesity.   He has been on his fair amount of stress related to his mother who has dementia a She is still is at home and he is her primary caregiver he does have emotional support for himself but he does not have anyone else helping him out with his mother at present.  Last/on he lost his sister who passed away in her 82s from what sounds like Cardiac Arrest in April of This Year.  This Is Because Considerable Distress to Sean's Mother Gregary Signs and His Sister Were Estranged from 1 Another.  Regardless However This Is Increased Distress on [name]'s Mother Has Been More More Upset Recently.  He Has Been Checking on At [facility] Twice per Day and Staying with Her on Additional Days When He Can.  Apparently No Other Family Members Nearby That Can Help and It Is Really All on His Shoulders Currently.       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   Syphilitic hepatitis    Type 2 diabetes mellitus ([facility])    Vaccine counseling 06/14/2022    Past Surgical History:  Procedure Laterality Date   BIOPSY   12/28/2019   Procedure: BIOPSY;  Surgeon: Malissa Hippo, MD;  [facility];  Service: Endoscopy;;   CHOLECYSTECTOMY OPEN  07/2001   COLONOSCOPY N/A 05/28/2017   Procedure: COLONOSCOPY;  Surgeon: Malissa Hippo, MD;  [facility];  Service: Endoscopy;  Laterality: N/A;  200 - pt to prep in Endo   CORONARY ANGIOPLASTY WITH STENT PLACEMENT  03/2006; 10/2007;    ESOPHAGEAL DILATION N/A 12/28/2019   Procedure: ESOPHAGEAL DILATION;  Surgeon: Malissa Hippo, MD;  [facility];  Service: Endoscopy;  Laterality: N/A;   ESOPHAGOGASTRODUODENOSCOPY N/A 12/28/2019   Procedure: ESOPHAGOGASTRODUODENOSCOPY (EGD);  Surgeon: Malissa Hippo, MD;  [facility];  Service: Endoscopy;  Laterality: N/A;  125, per Dewayne Hatch moved to 10:55 pt aware   HERNIA REPAIR     LAPAROSCOPIC INCISIONAL / UMBILICAL / VENTRAL HERNIA REPAIR  07/2001   UHR   POLYPECTOMY  05/28/2017   Procedure: POLYPECTOMY;  Surgeon: Malissa Hippo, MD;  [facility];  Service: Endoscopy;;  colon    Family History  Problem Relation Age of Onset   Heart attack Father        7 heart attacks   CAD Father    Alcoholism Father    Hyperlipidemia Mother    Hypertension Mother    Anemia Other    Diabetes Other  CAD Sister    Diabetes Sister       Social History   Socioeconomic History   Marital status: Single    Spouse name: Not on file   Number of children: 0   Years of education: Not on file   Highest education level: Not on file  Occupational History   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  Tobacco Use   Smoking status: Never   Smokeless tobacco: Never  Vaping Use   Vaping Use: Never used  Substance and Sexual Activity   Alcohol use: Not Currently    Alcohol/week: 0.0 - 1.0 standard drinks of alcohol    Comment: Social drinker, vodka, once a month   Drug use: No   Sexual activity: Not Currently    Partners: Male    Comment: declined condoms   Other Topics Concern   Not on file  Social History Narrative   Not on file   Social Determinants of Health   Financial Resource Strain: Not on file  Food Insecurity: Not on file  Transportation Needs: Not on file  Physical Activity: Not on file  Stress: Not on file  Social Connections: Not on file    Allergies  Allergen Reactions   Famotidine Other (See Comments)    Contraindicated with ODEFSEY Other reaction(s): Other (See Comments) Contraindicated with ODEFSEY   Prilosec [Omeprazole] Other (See Comments)    Contraindicated with RPV (lowers level of this ARV in ODEFSEY   Odefsey [Emtricitabine-Rilpivirine-Tenofovir Af]     Makes blood sugars run high     Current Outpatient Medications:    aspirin 81 MG tablet, Take 1 tablet (81 mg total) by mouth daily with breakfast., Disp: 30 tablet, Rfl: 3   baclofen (LIORESAL) 10 MG tablet, Take 1 tablet (10 mg total) by mouth at bedtime as needed., Disp: 30 tablet, Rfl: 0   cabotegravir & rilpivirine ER (CABENUVA) 600 & 900 MG/3ML injection, Inject 1 kit into the muscle every 2 (two) months., Disp: 6 mL, Rfl: 5   chlorthalidone (HYGROTON) 25 MG tablet, Take 1 tablet (25 mg total) by mouth daily., Disp: 90 tablet, Rfl: 3   Continuous Glucose Sensor (FREESTYLE LIBRE 14 DAY SENSOR) MISC, Use as directed for continuous glucose monitoring. Change every 14 days, Disp: 2 each, Rfl: 12   cyanocobalamin (VITAMIN B12) 1000 MCG/ML injection, Inject 1ml ( ) into the muscle once every 30 days, Disp: 1 mL, Rfl: 2   ergocalciferol (VITAMIN D2) 1.25 MG (50000 UT) capsule, Take 1 capsule by mouth once weekly, Disp: 4 capsule, Rfl: 0   escitalopram (LEXAPRO) 10 MG tablet, Take 1 tablet (10 mg total) by mouth daily., Disp: 90 tablet, Rfl: 0   ezetimibe (ZETIA) 10 MG tablet, Take 1 tablet (10 mg total) by mouth daily., Disp: 90 tablet, Rfl: 3   insulin degludec (TRESIBA FLEXTOUCH) 100 UNIT/ML FlexTouch Pen, INJECT 45 UNITS UNDER THE SKIN TWICE DAILY,  Disp: 30 mL, Rfl: 12   Insulin Glargine (BASAGLAR KWIKPEN) 100 UNIT/ML, Inject 45 Units into the skin 2 (two) times daily., Disp: , Rfl:    insulin lispro (HUMALOG KWIKPEN) 100 UNIT/ML KwikPen, INJECT 35 UNITS 4 TIMES DAILY, Disp: 90 mL, Rfl: 4   Insulin Pen Needle (TECHLITE PEN NEEDLES) 31G X 8 MM MISC, Use as directed, Disp: 15000 each, Rfl: 12   Insulin Pen Needle (UNIFINE PENTIPS) 31G X 5 MM MISC, Use as directed, Disp: 150 each, Rfl: 4   Insulin Pen Needle 31G X 5 MM MISC, Use  as directed, Disp: 150 each, Rfl: 4   lisinopril (ZESTRIL) 10 MG tablet, TAKE 1 TABLET BY MOUTH EVERY DAY, Disp: 90 tablet, Rfl: 3   LORazepam (ATIVAN) 0.5 MG tablet, Take 1 tablet (0.5 mg total) by mouth 2 (two) times daily as needed., Disp: 30 tablet, Rfl: 3   mupirocin ointment (BACTROBAN) 2 %, Apply topically twice daily, Disp: 22 g, Rfl: 2   propranolol (INDERAL) 20 MG tablet, Take 1 tablet (20 mg total) by mouth 2 (two) times daily., Disp: 60 tablet, Rfl: 2   rosuvastatin (CRESTOR) 40 MG tablet, Take 1 tablet by mouth daily., Disp: 90 tablet, Rfl: 3   SYRINGE-NEEDLE, DISP, 3 ML 25G X 1" 3 ML MISC, Use to inject Vitamin B12 into the muscle once a month, Disp: 10 each, Rfl: 0   tirzepatide (MOUNJARO) 15 MG/0.5ML Pen, Inject 15 mg into the skin once a week., Disp: 2 mL, Rfl: 0   Review of Systems  Constitutional:  Negative for activity change, appetite change, chills, diaphoresis, fatigue, fever and unexpected weight change.  HENT:  Negative for congestion, rhinorrhea, sinus pressure, sneezing, sore throat and trouble swallowing.   Eyes:  Negative for photophobia and visual disturbance.  Respiratory:  Negative for cough, chest tightness, shortness of breath, wheezing and stridor.   Cardiovascular:  Negative for chest pain, palpitations and leg swelling.  Gastrointestinal:  Negative for abdominal distention, abdominal pain, anal bleeding, blood in stool, constipation, diarrhea, nausea and vomiting.  Genitourinary:   Negative for difficulty urinating, dysuria, flank pain and hematuria.  Musculoskeletal:  Negative for arthralgias, back pain, gait problem, joint swelling and myalgias.  Skin:  Negative for color change, pallor, rash and wound.  Neurological:  Negative for dizziness, tremors, weakness, light-headedness and headaches.  Hematological:  Negative for adenopathy. Does not bruise/bleed easily.  Psychiatric/Behavioral:  Negative for agitation, behavioral problems, confusion, decreased concentration, dysphoric mood, sleep disturbance and suicidal ideas.        Objective:   Physical Exam Constitutional:      General: He is not in acute distress.    Appearance: Normal appearance. He is well-developed. He is not ill-appearing or diaphoretic.  HENT:     Head: Normocephalic and atraumatic.     Right Ear: Hearing and external ear normal.     Left Ear: Hearing and external ear normal.     Nose: No nasal deformity or rhinorrhea.  Eyes:     General: No scleral icterus.    Conjunctiva/sclera: Conjunctivae normal.     Right eye: Right conjunctiva is not injected.     Left eye: Left conjunctiva is not injected.     Pupils: Pupils are equal, round, and reactive to light.  Neck:     Vascular: No JVD.  Cardiovascular:     Rate and Rhythm: Normal rate and regular rhythm.     Heart sounds: S1 normal and S2 normal.  Abdominal:     General: Bowel sounds are normal.     Palpations: Abdomen is soft.  Musculoskeletal:        General: Normal range of motion.     Right shoulder: Normal.     Left shoulder: Normal.     Cervical back: Normal range of motion and neck supple.     Right hip: Normal.     Left hip: Normal.     Right knee: Normal.     Left knee: Normal.  Lymphadenopathy:     Head:     Right side of head: No submandibular,  preauricular or posterior auricular adenopathy.     Left side of head: No submandibular, preauricular or posterior auricular adenopathy.     Cervical: No cervical adenopathy.      Right cervical: No superficial or deep cervical adenopathy.    Left cervical: No superficial or deep cervical adenopathy.  Skin:    General: Skin is warm and dry.     Coloration: Skin is not pale.     Findings: No abrasion, bruising, ecchymosis, erythema, lesion or rash.     Nails: There is no clubbing.  Neurological:     General: No focal deficit present.     Mental Status: He is alert and oriented to person, place, and time.     Sensory: No sensory deficit.     Coordination: Coordination normal.     Gait: Gait normal.  Psychiatric:        Attention and Perception: He is attentive.        Mood and Affect: Mood normal.        Speech: Speech normal.        Behavior: Behavior normal. Behavior is cooperative.        Thought Content: Thought content normal.        Judgment: Judgment normal.           Assessment & Plan:   HIV disease:  I will add order HIV viral load CD4 count CBC with differential CMP, RPR GC and chlamydia and I will continue  Artavius P Patchen's Cabenuva injections  Stress and caregiver fatigue: He is endeavoring to get as much support for himself and his mother as possible.  Coronary disease continue beta-blocker ACE inhibitor and statin.   CAD: continue beta blocker, ACE and statin  Hyperlipidemia: continue crestor and he ison 40 as well as with zetia   Insulin-dependent diabetes on insulin follows with Carylon Perches   Counseling recommended hepatitis A vaccination was received today.

## 2022-10-16 LAB — COMPLETE METABOLIC PANEL WITH GFR
ALT: 10 U/L (ref 9–46)
Glucose, Bld: 124 mg/dL — ABNORMAL HIGH (ref 65–99)
Total Bilirubin: 0.4 mg/dL (ref 0.2–1.2)

## 2022-10-16 LAB — LIPID PANEL
HDL: 52 mg/dL (ref >=40)
Non-HDL Cholesterol (Calc): 173 mg/dL (calc) — ABNORMAL HIGH (ref <130)
Total CHOL/HDL Ratio: 4.3 (calc) (ref <5.0)

## 2022-10-16 LAB — URINE CYTOLOGY ANCILLARY ONLY
Chlamydia: NEGATIVE
Comment: NEGATIVE
Comment: NORMAL
Neisseria Gonorrhea: NEGATIVE

## 2022-10-16 LAB — T-HELPER CELLS (CD4) COUNT (NOT AT ARMC)
CD4 % Helper T Cell: 47 % (ref 33–65)
CD4 T Cell Abs: 1068 /uL (ref 400–1790)

## 2022-10-17 LAB — COMPLETE METABOLIC PANEL WITH GFR
AG Ratio: 1.7 (calc) (ref 1.0–2.5)
AST: 8 U/L — ABNORMAL LOW (ref 10–35)
Albumin: 4.1 g/dL (ref 3.6–5.1)
Alkaline phosphatase (APISO): 69 U/L (ref 35–144)
BUN/Creatinine Ratio: 22 (calc) (ref 6–22)
BUN: 14 mg/dL (ref 7–25)
CO2: 26 mmol/L (ref 20–32)
Calcium: 9.2 mg/dL (ref 8.6–10.3)
Chloride: 105 mmol/L (ref 98–110)
Creat: 0.65 mg/dL — ABNORMAL LOW (ref 0.70–1.30)
Globulin: 2.4 g/dL (calc) (ref 1.9–3.7)
Potassium: 4 mmol/L (ref 3.5–5.3)
Sodium: 141 mmol/L (ref 135–146)
Total Protein: 6.5 g/dL (ref 6.1–8.1)
eGFR: 110 mL/min/{1.73_m2} (ref >=60)

## 2022-10-17 LAB — CBC WITH DIFFERENTIAL/PLATELET
Absolute Monocytes: 437 cells/uL (ref 200–950)
Basophils Absolute: 78 cells/uL (ref 0–200)
Basophils Relative: 1.4 %
Eosinophils Relative: 6.4 %
HCT: 43.9 % (ref 38.5–50.0)
Hemoglobin: 14.3 g/dL (ref 13.2–17.1)
Lymphs Abs: 2268 cells/uL (ref 850–3900)
MCH: 28 pg (ref 27.0–33.0)
MCV: 85.9 fL (ref 80.0–100.0)
MPV: 9 fL (ref 7.5–12.5)
Monocytes Relative: 7.8 %
Neutro Abs: 2458 cells/uL (ref 1500–7800)
Neutrophils Relative %: 43.9 %
Platelets: 318 10*3/uL (ref 140–400)
WBC: 5.6 10*3/uL (ref 3.8–10.8)

## 2022-10-17 LAB — SYPHILIS: RPR W/REFLEX TO RPR TITER AND TREPONEMAL ANTIBODIES, TRADITIONAL SCREENING AND DIAGNOSIS ALGORITHM: RPR Ser Ql: NONREACTIVE

## 2022-10-17 LAB — LIPID PANEL
Cholesterol: 225 mg/dL — ABNORMAL HIGH (ref <200)
LDL Cholesterol (Calc): 147 mg/dL (calc) — ABNORMAL HIGH
Triglycerides: 140 mg/dL (ref <150)

## 2022-10-17 LAB — HIV-1 RNA QUANT-NO REFLEX-BLD
HIV 1 RNA Quant: <20 {copies}/mL — ABNORMAL HIGH
HIV-1 RNA Quant, Log: <1.3 {Log_copies}/mL — ABNORMAL HIGH

## 2022-10-17 LAB — HEPATITIS C AB W/RFL RNA, PCR + GENO: Hepatitis C Ab: NONREACTIVE

## 2022-10-22 ENCOUNTER — Other Ambulatory Visit: Payer: Self-pay

## 2022-10-22 ENCOUNTER — Other Ambulatory Visit (HOSPITAL_COMMUNITY): Payer: Self-pay

## 2022-10-28 ENCOUNTER — Other Ambulatory Visit: Payer: Self-pay

## 2022-10-28 ENCOUNTER — Other Ambulatory Visit: Payer: Self-pay | Admitting: Cardiology

## 2022-10-28 ENCOUNTER — Other Ambulatory Visit (HOSPITAL_COMMUNITY): Payer: Self-pay

## 2022-10-28 MED ORDER — ROSUVASTATIN CALCIUM 40 MG PO TABS
40.0000 mg | ORAL_TABLET | Freq: Every day | ORAL | 3 refills | Status: DC
  Filled 2022-10-28: qty 90, 90d supply, fill #0
  Filled 2023-01-26: qty 90, 90d supply, fill #1
  Filled 2023-04-22: qty 90, 90d supply, fill #2
  Filled 2023-07-21: qty 90, 90d supply, fill #3

## 2022-10-28 MED ORDER — LISINOPRIL 10 MG PO TABS
10.0000 mg | ORAL_TABLET | Freq: Every day | ORAL | 3 refills | Status: DC
  Filled 2022-10-28: qty 90, 90d supply, fill #0
  Filled 2023-01-21: qty 90, 90d supply, fill #1
  Filled 2023-04-21: qty 90, 90d supply, fill #2
  Filled 2023-07-20: qty 90, 90d supply, fill #3

## 2022-10-28 MED ORDER — MOUNJARO 15 MG/0.5ML ~~LOC~~ SOAJ
15.0000 mg | SUBCUTANEOUS | 0 refills | Status: DC
  Filled 2022-10-28 – 2023-01-05 (×4): qty 2, 28d supply, fill #0

## 2022-10-29 ENCOUNTER — Other Ambulatory Visit: Payer: Self-pay

## 2022-11-02 ENCOUNTER — Other Ambulatory Visit (HOSPITAL_COMMUNITY): Payer: Self-pay

## 2022-11-02 ENCOUNTER — Other Ambulatory Visit: Payer: Self-pay

## 2022-11-02 DIAGNOSIS — Z6830 Body mass index (BMI) 30.0-30.9, adult: Secondary | ICD-10-CM | POA: Diagnosis not present

## 2022-11-02 DIAGNOSIS — Z794 Long term (current) use of insulin: Secondary | ICD-10-CM | POA: Diagnosis not present

## 2022-11-02 DIAGNOSIS — E669 Obesity, unspecified: Secondary | ICD-10-CM | POA: Diagnosis not present

## 2022-11-02 DIAGNOSIS — G4709 Other insomnia: Secondary | ICD-10-CM | POA: Diagnosis not present

## 2022-11-02 DIAGNOSIS — E1169 Type 2 diabetes mellitus with other specified complication: Secondary | ICD-10-CM | POA: Diagnosis not present

## 2022-11-02 DIAGNOSIS — F418 Other specified anxiety disorders: Secondary | ICD-10-CM | POA: Diagnosis not present

## 2022-11-02 MED ORDER — ESCITALOPRAM OXALATE 20 MG PO TABS
20.0000 mg | ORAL_TABLET | Freq: Every day | ORAL | 3 refills | Status: DC
  Filled 2022-11-02: qty 30, 30d supply, fill #0
  Filled 2022-11-26: qty 30, 30d supply, fill #1
  Filled 2023-01-21: qty 30, 30d supply, fill #2
  Filled 2023-02-15: qty 30, 30d supply, fill #3

## 2022-11-03 ENCOUNTER — Other Ambulatory Visit (HOSPITAL_COMMUNITY): Payer: Self-pay

## 2022-11-03 ENCOUNTER — Other Ambulatory Visit: Payer: Self-pay

## 2022-11-03 MED ORDER — ZOLPIDEM TARTRATE 10 MG PO TABS
10.0000 mg | ORAL_TABLET | Freq: Every evening | ORAL | 0 refills | Status: DC | PRN
  Filled 2022-11-03: qty 15, 30d supply, fill #0

## 2022-11-09 ENCOUNTER — Other Ambulatory Visit (HOSPITAL_COMMUNITY): Payer: Self-pay

## 2022-11-17 ENCOUNTER — Other Ambulatory Visit (HOSPITAL_COMMUNITY): Payer: Self-pay

## 2022-11-18 ENCOUNTER — Other Ambulatory Visit (HOSPITAL_COMMUNITY): Payer: Self-pay

## 2022-11-19 ENCOUNTER — Other Ambulatory Visit (HOSPITAL_COMMUNITY): Payer: Self-pay

## 2022-11-20 ENCOUNTER — Other Ambulatory Visit (HOSPITAL_COMMUNITY): Payer: Self-pay

## 2022-11-20 ENCOUNTER — Other Ambulatory Visit: Payer: Self-pay

## 2022-11-20 MED ORDER — MOUNJARO 12.5 MG/0.5ML ~~LOC~~ SOAJ
12.5000 mg | SUBCUTANEOUS | 1 refills | Status: DC
  Filled 2022-11-20: qty 2, 28d supply, fill #0
  Filled 2023-02-11 – 2023-07-08 (×3): qty 2, 28d supply, fill #1

## 2022-11-20 MED ORDER — INSULIN LISPRO (1 UNIT DIAL) 100 UNIT/ML (KWIKPEN)
35.0000 [IU] | PEN_INJECTOR | Freq: Four times a day (QID) | SUBCUTANEOUS | 4 refills | Status: DC
  Filled 2022-11-20: qty 42, 30d supply, fill #0
  Filled 2022-11-26: qty 90, 84d supply, fill #0
  Filled 2023-02-17: qty 90, 84d supply, fill #1
  Filled 2023-05-12: qty 90, 84d supply, fill #2
  Filled 2023-08-04: qty 90, 84d supply, fill #3
  Filled 2023-11-17: qty 90, 84d supply, fill #4

## 2022-11-23 ENCOUNTER — Other Ambulatory Visit (HOSPITAL_COMMUNITY): Payer: Self-pay

## 2022-11-24 ENCOUNTER — Other Ambulatory Visit (HOSPITAL_COMMUNITY): Payer: Self-pay

## 2022-11-26 ENCOUNTER — Other Ambulatory Visit (HOSPITAL_COMMUNITY): Payer: Self-pay

## 2022-11-27 ENCOUNTER — Other Ambulatory Visit: Payer: Self-pay

## 2022-11-27 ENCOUNTER — Other Ambulatory Visit (HOSPITAL_COMMUNITY): Payer: Self-pay

## 2022-11-30 ENCOUNTER — Other Ambulatory Visit (HOSPITAL_COMMUNITY): Payer: Self-pay

## 2022-11-30 ENCOUNTER — Other Ambulatory Visit: Payer: Self-pay

## 2022-11-30 MED ORDER — PROPRANOLOL HCL 20 MG PO TABS
20.0000 mg | ORAL_TABLET | Freq: Two times a day (BID) | ORAL | 2 refills | Status: DC
  Filled 2022-11-30: qty 60, 30d supply, fill #0
  Filled 2023-01-21 – 2023-02-02 (×3): qty 60, 30d supply, fill #1
  Filled 2023-03-03: qty 60, 30d supply, fill #2

## 2022-12-01 ENCOUNTER — Other Ambulatory Visit: Payer: Self-pay

## 2022-12-02 ENCOUNTER — Other Ambulatory Visit: Payer: Self-pay | Admitting: Oncology

## 2022-12-02 ENCOUNTER — Other Ambulatory Visit (HOSPITAL_COMMUNITY): Payer: Self-pay

## 2022-12-02 DIAGNOSIS — Z006 Encounter for examination for normal comparison and control in clinical research program: Secondary | ICD-10-CM

## 2022-12-03 ENCOUNTER — Other Ambulatory Visit (HOSPITAL_COMMUNITY): Payer: Self-pay

## 2022-12-03 MED ORDER — FREESTYLE LIBRE 14 DAY SENSOR MISC
12 refills | Status: DC
  Filled 2022-12-03 (×2): qty 2, 28d supply, fill #0
  Filled 2023-01-05 (×2): qty 2, 28d supply, fill #1
  Filled 2023-01-21 – 2023-01-27 (×3): qty 2, 28d supply, fill #2
  Filled 2023-02-17 – 2023-02-18 (×2): qty 2, 28d supply, fill #3
  Filled 2023-03-23: qty 2, 28d supply, fill #4
  Filled 2023-04-21 – 2023-09-01 (×2): qty 2, 28d supply, fill #5

## 2022-12-08 ENCOUNTER — Other Ambulatory Visit (HOSPITAL_COMMUNITY): Payer: Self-pay

## 2022-12-10 ENCOUNTER — Telehealth: Payer: Self-pay

## 2022-12-10 NOTE — Telephone Encounter (Signed)
[facility] Patient Advocate Encounter  Patient's medication Renaldo Harrison) have been couriered to [facility] from [facility] and will be administered on the patient next office visit on 12/14/22.  Clearance Coots , CPhT Specialty Pharmacy Patient [facility] for Infectious Disease Phone: [phone] Fax:  [phone]

## 2022-12-14 ENCOUNTER — Ambulatory Visit (INDEPENDENT_AMBULATORY_CARE_PROVIDER_SITE_OTHER): Payer: 59 | Admitting: Pharmacist

## 2022-12-14 ENCOUNTER — Other Ambulatory Visit: Payer: Self-pay

## 2022-12-14 DIAGNOSIS — B20 Human immunodeficiency virus [HIV] disease: Secondary | ICD-10-CM

## 2022-12-14 MED ORDER — CABOTEGRAVIR & RILPIVIRINE ER 600 & 900 MG/3ML IM SUER
1.0000 | Freq: Once | INTRAMUSCULAR | Status: AC
Start: 2022-12-14 — End: 2022-12-14
  Administered 2022-12-14: 1 via INTRAMUSCULAR

## 2022-12-14 NOTE — Progress Notes (Signed)
HPI: [name] is a 57 y.o. male who presents to the [facility] pharmacy clinic for [place] administration.  Patient Active Problem List   Diagnosis Date Noted   Vaccine counseling 06/14/2022   Thrombocytosis 05/28/2021   DKA (diabetic ketoacidosis) (HCC) 03/05/2020   High anion gap metabolic acidosis 03/05/2020   Leukocytosis 03/05/2020   Dehydration 03/05/2020   Hyponatremia 03/05/2020   Hyperkalemia 03/05/2020   Hyperglycemia due to diabetes mellitus (HCC) 03/05/2020   Intractable nausea and vomiting 03/05/2020   DKA, type 2 (HCC) 03/05/2020   Other fatigue 03/14/2018   Shortness of breath on exertion 03/14/2018   Uncontrolled type 2 diabetes mellitus with hyperglycemia (HCC) 02/08/2018   Mixed hyperlipidemia 02/08/2018   AKI (acute kidney injury) (HCC) 03/23/2017   Special screening for malignant neoplasms, colon 11/18/2016   Family history of colon cancer 11/18/2016   Lung nodule, multiple 09/27/2014   Poorly controlled type 2 diabetes mellitus with circulatory disorder (HCC) 09/27/2014   Early syphilis, 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   Hypercholesterolemia 01/25/2013   Ejection fraction    Essential hypertension, benign    Human immunodeficiency virus (HIV) disease (HCC) 09/11/2011   Kidney stone on left side 01/04/2011   DKA (diabetic ketoacidoses) 01/04/2011   Coronary artery disease due to lipid rich plaque    Secondary syphilis    LFTs abnormal     Patient's Medications  New Prescriptions   No medications on file  Previous Medications   ASPIRIN 81 MG TABLET    Take 1 tablet (81 mg total) by mouth daily with breakfast.   BACLOFEN (LIORESAL) 10 MG TABLET    Take 1 tablet (10 mg total) by mouth at bedtime as needed.   CABOTEGRAVIR & RILPIVIRINE ER (CABENUVA) 600 & 900 MG/3ML INJECTION    Inject 1 kit into the muscle every 2 (two) months.   CHLORTHALIDONE (HYGROTON) 25 MG TABLET    Take 1 tablet (25 mg total) by mouth daily.   CONTINUOUS GLUCOSE SENSOR (FREESTYLE  LIBRE 14 DAY SENSOR) MISC    Use as directed for continuous glucose monitoring. Change every 14 days   CYANOCOBALAMIN (VITAMIN B12) 1000 MCG/ML INJECTION    Inject 1ml ( ) into the muscle once every 30 days   ERGOCALCIFEROL (VITAMIN D2) 1.25 MG (50000 UT) CAPSULE    Take 1 capsule by mouth once weekly   ESCITALOPRAM (LEXAPRO) 10 MG TABLET    Take 1 tablet (10 mg total) by mouth daily.   ESCITALOPRAM (LEXAPRO) 20 MG TABLET    Take 1 tablet (20 mg total) by mouth daily.   EZETIMIBE (ZETIA) 10 MG TABLET    Take 1 tablet (10 mg total) by mouth daily.   INSULIN DEGLUDEC (TRESIBA FLEXTOUCH) 100 UNIT/ML FLEXTOUCH PEN    INJECT 45 UNITS UNDER THE SKIN TWICE DAILY   INSULIN GLARGINE (BASAGLAR KWIKPEN) 100 UNIT/ML    Inject 45 Units into the skin 2 (two) times daily.   INSULIN LISPRO (HUMALOG) 100 UNIT/ML KWIKPEN    Inject 35 Units into the skin 4 (four) times daily.   INSULIN PEN NEEDLE (TECHLITE PEN NEEDLES) 31G X 8 MM MISC    Use as directed   INSULIN PEN NEEDLE (UNIFINE PENTIPS) 31G X 5 MM MISC    Use as directed   INSULIN PEN NEEDLE 31G X 5 MM MISC    Use as directed   LISINOPRIL (ZESTRIL) 10 MG TABLET    Take 1 tablet (10 mg total) by mouth daily.   LORAZEPAM (ATIVAN) 0.5 MG  TABLET    Take 1 tablet (0.5 mg total) by mouth 2 (two) times daily as needed.   MUPIROCIN OINTMENT (BACTROBAN) 2 %    Apply topically twice daily   PROPRANOLOL (INDERAL) 20 MG TABLET    Take 1 tablet (20 mg total) by mouth 2 (two) times daily.   ROSUVASTATIN (CRESTOR) 40 MG TABLET    Take 1 tablet by mouth daily.   SYRINGE-NEEDLE, DISP, 3 ML 25G X 1" 3 ML MISC    Use to inject Vitamin B12 into the muscle once a month   TIRZEPATIDE (MOUNJARO) 12.5 MG/0.5ML PEN    Inject 12.5 mg into the skin once a week.   TIRZEPATIDE (MOUNJARO) 15 MG/0.5ML PEN    Inject 15 mg into the skin once a week.   ZOLPIDEM (AMBIEN) 10 MG TABLET    Take 1 tablet (10 mg total) by mouth at bedtime as needed for sleep. 30 day prescription.  Modified  Medications   No medications on file  Discontinued Medications   No medications on file    Allergies: Allergies  Allergen Reactions   Famotidine Other (See Comments)    Contraindicated with ODEFSEY Other reaction(s): Other (See Comments) Contraindicated with ODEFSEY   Prilosec [Omeprazole] Other (See Comments)    Contraindicated with RPV (lowers level of this ARV in ODEFSEY   Odefsey [Emtricitabine-Rilpivirine-Tenofovir Af]     Makes blood sugars run high    Labs: Lab Results  Component Value Date   HIV1RNAQUANT <20 (H) 10/15/2022   HIV1RNAQUANT <20 (H) 06/15/2022   HIV1RNAQUANT <20 (H) 04/13/2022   CD4TABS 1,068 10/15/2022   CD4TABS 904 04/13/2022   CD4TABS 897 03/18/2021    RPR and STI Lab Results  Component Value Date   LABRPR NON-REACTIVE 10/15/2022   LABRPR NON-REACTIVE 08/18/2021   LABRPR NON-REACTIVE 03/18/2020   LABRPR NON-REACTIVE 03/14/2019   LABRPR NON-REACTIVE 02/22/2018    STI Results GC CT  10/15/2022  9:31 AM Negative  Negative   08/18/2021  9:21 AM Negative    Negative    Negative  Negative    Negative    Negative   02/22/2018 12:00 AM Negative  Negative   11/30/2016 12:00 AM Negative  Negative   10/28/2015 12:00 AM Negative  Negative   09/13/2014 12:00 AM Negative  Negative     Hepatitis B Lab Results  Component Value Date   HEPBSAB REACTIVE (A) 08/18/2021   HEPBSAG NON-REACTIVE 08/18/2021   Hepatitis C Lab Results  Component Value Date   HEPCAB NON-REACTIVE 10/15/2022   Hepatitis A Lab Results  Component Value Date   HAV NON-REACTIVE 08/18/2021   Lipids: Lab Results  Component Value Date   CHOL 225 (H) 10/15/2022   TRIG 140 10/15/2022   HDL 52 10/15/2022   CHOLHDL 4.3 10/15/2022   VLDL 31 06/23/2022   LDLCALC 147 (H) 10/15/2022    TARGET DATE: The 25th  Assessment: [name] presents today for his maintenance Cabenuva injections. Past injections were tolerated well without issues. Last HIV RNA was undetectable in May.  Will defer today. He had a spot on his shoulder that he wanted me to look at. It was scabbed over and did not look red or infected. I had Megan, RN, take a look as well. She agreed that it looked fine. Asked him to reach out if it got worse. He agrees.  Administered cabotegravir 600mg /13mL in left upper outer quadrant of the gluteal muscle. Administered rilpivirine 900 mg/56mL in the right upper outer quadrant of the gluteal  muscle. No issues with injections. He will follow up in 2 months for next set of injections.  Plan: - Cabenuva injections administered - Next injections scheduled for 02/15/23 with Dr. Daiva Eves and 04/12/23 with me  - Call with any issues or questions  Emmah Bratcher L. Faiza Bansal, PharmD, BCIDP, AAHIVP, CPP Clinical Pharmacist Practitioner Infectious Diseases Clinical Pharmacist [facility]

## 2023-01-05 ENCOUNTER — Other Ambulatory Visit (HOSPITAL_COMMUNITY): Payer: Self-pay

## 2023-01-12 ENCOUNTER — Other Ambulatory Visit: Payer: Self-pay

## 2023-01-12 ENCOUNTER — Other Ambulatory Visit (HOSPITAL_COMMUNITY): Payer: Self-pay

## 2023-01-12 DIAGNOSIS — Z794 Long term (current) use of insulin: Secondary | ICD-10-CM | POA: Diagnosis not present

## 2023-01-12 DIAGNOSIS — E669 Obesity, unspecified: Secondary | ICD-10-CM | POA: Diagnosis not present

## 2023-01-12 DIAGNOSIS — G4709 Other insomnia: Secondary | ICD-10-CM | POA: Diagnosis not present

## 2023-01-12 DIAGNOSIS — E1169 Type 2 diabetes mellitus with other specified complication: Secondary | ICD-10-CM | POA: Diagnosis not present

## 2023-01-12 DIAGNOSIS — F4010 Social phobia, unspecified: Secondary | ICD-10-CM | POA: Diagnosis not present

## 2023-01-12 DIAGNOSIS — Z6830 Body mass index (BMI) 30.0-30.9, adult: Secondary | ICD-10-CM | POA: Diagnosis not present

## 2023-01-12 MED ORDER — PROPRANOLOL HCL 20 MG PO TABS
20.0000 mg | ORAL_TABLET | Freq: Three times a day (TID) | ORAL | 3 refills | Status: DC | PRN
  Filled 2023-01-12: qty 90, 30d supply, fill #0

## 2023-01-21 ENCOUNTER — Other Ambulatory Visit (HOSPITAL_COMMUNITY): Payer: Self-pay

## 2023-01-21 ENCOUNTER — Other Ambulatory Visit: Payer: Self-pay | Admitting: Cardiology

## 2023-01-21 ENCOUNTER — Other Ambulatory Visit: Payer: Self-pay

## 2023-01-21 MED ORDER — EZETIMIBE 10 MG PO TABS
10.0000 mg | ORAL_TABLET | Freq: Every day | ORAL | 3 refills | Status: AC
  Filled 2023-01-21: qty 90, 90d supply, fill #0
  Filled 2023-04-09: qty 90, 90d supply, fill #1
  Filled 2023-07-20: qty 90, 90d supply, fill #2
  Filled 2023-12-09: qty 90, 90d supply, fill #3

## 2023-01-26 ENCOUNTER — Other Ambulatory Visit: Payer: Self-pay

## 2023-01-26 ENCOUNTER — Other Ambulatory Visit (HOSPITAL_COMMUNITY): Payer: Self-pay

## 2023-01-26 MED ORDER — ESCITALOPRAM OXALATE 10 MG PO TABS
10.0000 mg | ORAL_TABLET | Freq: Every day | ORAL | 3 refills | Status: AC
  Filled 2023-01-26: qty 90, 90d supply, fill #0
  Filled 2023-04-22: qty 90, 90d supply, fill #1
  Filled 2023-07-21: qty 90, 90d supply, fill #2
  Filled 2023-12-09 – 2024-01-03 (×2): qty 90, 90d supply, fill #3

## 2023-01-26 MED ORDER — TRESIBA FLEXTOUCH 100 UNIT/ML ~~LOC~~ SOPN
45.0000 [IU] | PEN_INJECTOR | Freq: Two times a day (BID) | SUBCUTANEOUS | 12 refills | Status: AC
  Filled 2023-01-26: qty 30, 33d supply, fill #0
  Filled 2023-01-27: qty 27, 30d supply, fill #0
  Filled 2023-02-21: qty 27, 30d supply, fill #1
  Filled 2023-03-23: qty 27, 30d supply, fill #2
  Filled 2023-04-15: qty 27, 30d supply, fill #3
  Filled 2023-05-27 – 2023-07-08 (×2): qty 27, 30d supply, fill #4
  Filled 2023-08-02: qty 27, 30d supply, fill #5
  Filled 2023-09-01: qty 27, 30d supply, fill #6
  Filled 2023-10-19: qty 27, 30d supply, fill #7
  Filled 2023-11-17 – 2023-12-09 (×2): qty 27, 30d supply, fill #8
  Filled 2024-01-03: qty 27, 30d supply, fill #9

## 2023-01-27 ENCOUNTER — Other Ambulatory Visit: Payer: Self-pay

## 2023-02-02 ENCOUNTER — Other Ambulatory Visit (HOSPITAL_COMMUNITY): Payer: Self-pay

## 2023-02-03 ENCOUNTER — Other Ambulatory Visit (HOSPITAL_COMMUNITY): Payer: Self-pay

## 2023-02-04 ENCOUNTER — Other Ambulatory Visit (HOSPITAL_COMMUNITY): Payer: Self-pay

## 2023-02-08 ENCOUNTER — Other Ambulatory Visit (HOSPITAL_COMMUNITY): Payer: Self-pay

## 2023-02-10 ENCOUNTER — Other Ambulatory Visit: Payer: Self-pay

## 2023-02-10 ENCOUNTER — Other Ambulatory Visit (HOSPITAL_COMMUNITY): Payer: Self-pay

## 2023-02-11 ENCOUNTER — Other Ambulatory Visit (HOSPITAL_COMMUNITY): Payer: Self-pay

## 2023-02-11 ENCOUNTER — Other Ambulatory Visit: Payer: Self-pay

## 2023-02-11 ENCOUNTER — Telehealth: Payer: Self-pay

## 2023-02-11 MED ORDER — MOUNJARO 12.5 MG/0.5ML ~~LOC~~ SOAJ
12.5000 mg | SUBCUTANEOUS | 1 refills | Status: DC
  Filled 2023-02-11: qty 2, 28d supply, fill #0

## 2023-02-11 NOTE — Telephone Encounter (Signed)
[facility] Patient Advocate Encounter  Patient's medication [name]) have been couriered to [facility] from [facility] and will be administered on the patient next office visit on 02/15/23.  Clearance Coots , CPhT Specialty Pharmacy Patient [facility] Phone: [phone] Fax:  [phone]

## 2023-02-14 NOTE — Progress Notes (Unsigned)
Subjective:  Chief complaint: followup for HIV disease on Cabenuva dealing with stressor of losing his sister and caring for his Mom   Patient ID: [name], male    DOB: 1966/03/13, 57 y.o.   MRN: [id number]  HPI  [name] is a 57 year-old Caucasian man who has HIV infection that has been peripheral controlled on [place] and now [place].  He does have comorbid insulin-dependent diabetes mellitus coronary artery disease hyperlipidemia and obesity.   He has been on his fair amount of stress related to his mother who has dementia a She is still is at home and he is her primary caregiver he does have emotional support for himself but he does not have anyone else helping him out with his mother at present.  Last/on he lost his sister who passed away in her 58s from what sounds like Cardiac Arrest in April of This Year.  This Is Because Considerable Distress to Sean's Mother Gregary Signs and His Sister Were Estranged from 1 Another.  Regardless However This Is Increased Distress on [name]'s Mother Has Been More More Upset Recently.  He Has Been [facility]ing on At [facility] Twice per Day and Staying with Her on Additional Days When He Can.  Apparently No Other Family Members Nearby That Can Help and It Is Really All on His Shoulders Currently.       Past Medical History:  Diagnosis Date  . 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 Diabetes mellitus type II   . Essential hypertension   . History of heart attack   . History of kidney stones   . HIV (human immunodeficiency virus infection) (HCC)    Diagnosed 2002  . LFTs abnormal   . Mixed hyperlipidemia   . 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  . Syphilitic hepatitis   . Type 2 diabetes mellitus (HCC)   . Vaccine counseling 06/14/2022    Past Surgical History:  Procedure Laterality Date   . BIOPSY  12/28/2019   Procedure: BIOPSY;  Surgeon: Malissa Hippo, MD;  [facility];  Service: Endoscopy;;  . CHOLECYSTECTOMY OPEN  07/2001  . COLONOSCOPY N/A 05/28/2017   Procedure: COLONOSCOPY;  Surgeon: Malissa Hippo, MD;  [facility];  Service: Endoscopy;  Laterality: N/A;  200 - pt to prep in Endo  . CORONARY ANGIOPLASTY WITH STENT PLACEMENT  03/2006; 10/2007;   . ESOPHAGEAL DILATION N/A 12/28/2019   Procedure: ESOPHAGEAL DILATION;  Surgeon: Malissa Hippo, MD;  [facility];  Service: Endoscopy;  Laterality: N/A;  . ESOPHAGOGASTRODUODENOSCOPY N/A 12/28/2019   Procedure: ESOPHAGOGASTRODUODENOSCOPY (EGD);  Surgeon: Malissa Hippo, MD;  [facility];  Service: Endoscopy;  Laterality: N/A;  125, per Dewayne Hatch moved to 10:55 pt aware  . HERNIA REPAIR    . LAPAROSCOPIC INCISIONAL / UMBILICAL / VENTRAL HERNIA REPAIR  07/2001   UHR  . POLYPECTOMY  05/28/2017   Procedure: POLYPECTOMY;  Surgeon: Malissa Hippo, MD;  [facility];  Service: Endoscopy;;  colon    Family History  Problem Relation Age of Onset  . Heart attack Father        7 heart attacks  . CAD Father   . Alcoholism Father   . Hyperlipidemia Mother   . Hypertension Mother   . Anemia Other   . Diabetes Other   .  CAD Sister   . Diabetes Sister       Social History   Socioeconomic History  . Marital status: Single    Spouse name: Not on file  . Number of children: 0  . Years of education: Not on file  . Highest education level: Not on file  Occupational History  . 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 Smoking status: Never  . Smokeless tobacco: Never  Vaping Use  . Vaping status: Never Used  Substance and Sexual Activity  . Alcohol use: Not Currently    Alcohol/week: 0.0 - 1.0 standard drinks of alcohol    Comment: Social drinker, vodka, once a month  . Drug use: No  . Sexual activity: Not Currently     Partners: Male    Comment: declined condoms  Other Topics Concern  . Not on file  Social History Narrative  . Not on file   Social Determinants of Health   Financial Resource Strain: Not on file  Food Insecurity: Not on file  Transportation Needs: Not on file  Physical Activity: Not on file  Stress: Not on file  Social Connections: Not on file    Allergies  Allergen Reactions  . Famotidine Other (See Comments)    Contraindicated with ODEFSEY Other reaction(s): Other (See Comments) Contraindicated with ODEFSEY  . Prilosec [Omeprazole] Other (See Comments)    Contraindicated with RPV (lowers level of this ARV in ODEFSEY  . Odefsey [Emtricitabine-Rilpivirine-Tenofovir Af]     Makes blood sugars run high     Current Outpatient Medications:  .  aspirin 81 MG tablet, Take 1 tablet (81 mg total) by mouth daily with breakfast., Disp: 30 tablet, Rfl: 3 .  baclofen (LIORESAL) 10 MG tablet, Take 1 tablet (10 mg total) by mouth at bedtime as needed., Disp: 30 tablet, Rfl: 0 .  cabotegravir & rilpivirine ER (CABENUVA) 600 & 900 MG/3ML injection, Inject 1 kit into the muscle every 2 (two) months., Disp: 6 mL, Rfl: 5 .  chlorthalidone (HYGROTON) 25 MG tablet, Take 1 tablet (25 mg total) by mouth daily., Disp: 90 tablet, Rfl: 3 .  Continuous Glucose Sensor (FREESTYLE LIBRE 14 DAY SENSOR) MISC, Use as directed for continuous glucose monitoring. Change every 14 days, Disp: 2 each, Rfl: 12 .  cyanocobalamin (VITAMIN B12) 1000 MCG/ML injection, Inject 1ml ( ) into the muscle once every 30 days, Disp: 1 mL, Rfl: 2 .  ergocalciferol (VITAMIN D2) 1.25 MG (50000 UT) capsule, Take 1 capsule by mouth once weekly, Disp: 4 capsule, Rfl: 0 .  escitalopram (LEXAPRO) 10 MG tablet, Take 1 tablet (10 mg total) by mouth daily., Disp: 90 tablet, Rfl: 3 .  escitalopram (LEXAPRO) 20 MG tablet, Take 1 tablet (20 mg total) by mouth daily., Disp: 30 tablet, Rfl: 3 .  ezetimibe (ZETIA) 10 MG tablet, Take 1  tablet (10 mg total) by mouth daily., Disp: 90 tablet, Rfl: 3 .  insulin degludec (TRESIBA FLEXTOUCH) 100 UNIT/ML FlexTouch Pen, Inject 45 Units into the skin 2 (two) times daily., Disp: 30 mL, Rfl: 12 .  Insulin Glargine (BASAGLAR KWIKPEN) 100 UNIT/ML, Inject 45 Units into the skin 2 (two) times daily., Disp: , Rfl:  .  insulin lispro (HUMALOG) 100 UNIT/ML KwikPen, Inject 35 Units into the skin 4 (four) times daily., Disp: 90 mL, Rfl: 4 .  Insulin Pen Needle (TECHLITE PEN NEEDLES) 31G X 8 MM MISC, Use as directed, Disp: 15000 each, Rfl: 12 .  Insulin Pen Needle (  UNIFINE PENTIPS) 31G X 5 MM MISC, Use as directed, Disp: 150 each, Rfl: 4 .  Insulin Pen Needle 31G X 5 MM MISC, Use as directed, Disp: 150 each, Rfl: 4 .  lisinopril (ZESTRIL) 10 MG tablet, Take 1 tablet (10 mg total) by mouth daily., Disp: 90 tablet, Rfl: 3 .  LORazepam (ATIVAN) 0.5 MG tablet, Take 1 tablet (0.5 mg total) by mouth 2 (two) times daily as needed., Disp: 30 tablet, Rfl: 3 .  mupirocin ointment (BACTROBAN) 2 %, Apply topically twice daily, Disp: 22 g, Rfl: 2 .  propranolol (INDERAL) 20 MG tablet, Take 1 tablet (20 mg total) by mouth 2 (two) times daily., Disp: 60 tablet, Rfl: 2 .  propranolol (INDERAL) 20 MG tablet, Take 1 tablet (20 mg total) by mouth 3 (three) times daily as needed., Disp: 90 tablet, Rfl: 3 .  rosuvastatin (CRESTOR) 40 MG tablet, Take 1 tablet by mouth daily., Disp: 90 tablet, Rfl: 3 .  SYRINGE-NEEDLE, DISP, 3 ML 25G X 1" 3 ML MISC, Use to inject Vitamin B12 into the muscle once a month, Disp: 10 each, Rfl: 0 .  tirzepatide (MOUNJARO) 12.5 MG/0.5ML Pen, Inject 12.5 mg into the skin once a week., Disp: 2 mL, Rfl: 1 .  tirzepatide (MOUNJARO) 12.5 MG/0.5ML Pen, Inject 12.5 mg into the skin once a week., Disp: 6 mL, Rfl: 1 .  tirzepatide (MOUNJARO) 15 MG/0.5ML Pen, Inject 15 mg into the skin once a week., Disp: 2 mL, Rfl: 0 .  zolpidem (AMBIEN) 10 MG tablet, Take 1 tablet (10 mg total) by mouth at bedtime as  needed for sleep. 30 day prescription., Disp: 15 tablet, Rfl: 0   Review of Systems  Constitutional:  Negative for activity change, appetite change, chills, diaphoresis, fatigue, fever and unexpected weight change.  HENT:  Negative for congestion, rhinorrhea, sinus pressure, sneezing, sore throat and trouble swallowing.   Eyes:  Negative for photophobia and visual disturbance.  Respiratory:  Negative for cough, chest tightness, shortness of breath, wheezing and stridor.   Cardiovascular:  Negative for chest pain, palpitations and leg swelling.  Gastrointestinal:  Negative for abdominal distention, abdominal pain, anal bleeding, blood in stool, constipation, diarrhea, nausea and vomiting.  Genitourinary:  Negative for difficulty urinating, dysuria, flank pain and hematuria.  Musculoskeletal:  Negative for arthralgias, back pain, gait problem, joint swelling and myalgias.  Skin:  Negative for color change, pallor, rash and wound.  Neurological:  Negative for dizziness, tremors, weakness, light-headedness and headaches.  Hematological:  Negative for adenopathy. Does not bruise/bleed easily.  Psychiatric/Behavioral:  Negative for agitation, behavioral problems, confusion, decreased concentration, dysphoric mood, sleep disturbance and suicidal ideas.        Objective:   Physical Exam Constitutional:      General: He is not in acute distress.    Appearance: Normal appearance. He is well-developed. He is not ill-appearing or diaphoretic.  HENT:     Head: Normocephalic and atraumatic.     Right Ear: Hearing and external ear normal.     Left Ear: Hearing and external ear normal.     Nose: No nasal deformity or rhinorrhea.  Eyes:     General: No scleral icterus.    Conjunctiva/sclera: Conjunctivae normal.     Right eye: Right conjunctiva is not injected.     Left eye: Left conjunctiva is not injected.     Pupils: Pupils are equal, round, and reactive to light.  Neck:     Vascular: No JVD.   Cardiovascular:  Rate and Rhythm: Normal rate and regular rhythm.     Heart sounds: S1 normal and S2 normal.  Abdominal:     General: Bowel sounds are normal.     Palpations: Abdomen is soft.  Musculoskeletal:        General: Normal range of motion.     Right shoulder: Normal.     Left shoulder: Normal.     Cervical back: Normal range of motion and neck supple.     Right hip: Normal.     Left hip: Normal.     Right knee: Normal.     Left knee: Normal.  Lymphadenopathy:     Head:     Right side of head: No submandibular, preauricular or posterior auricular adenopathy.     Left side of head: No submandibular, preauricular or posterior auricular adenopathy.     Cervical: No cervical adenopathy.     Right cervical: No superficial or deep cervical adenopathy.    Left cervical: No superficial or deep cervical adenopathy.  Skin:    General: Skin is warm and dry.     Coloration: Skin is not pale.     Findings: No abrasion, bruising, ecchymosis, erythema, lesion or rash.     Nails: There is no clubbing.  Neurological:     General: No focal deficit present.     Mental Status: He is alert and oriented to person, place, and time.     Sensory: No sensory deficit.     Coordination: Coordination normal.     Gait: Gait normal.  Psychiatric:        Attention and Perception: He is attentive.        Mood and Affect: Mood normal.        Speech: Speech normal.        Behavior: Behavior normal. Behavior is cooperative.        Thought Content: Thought content normal.        Judgment: Judgment normal.          Assessment & Plan:   HIV disease:  I will add order HIV viral load CD4 count CBC with differential CMP, RPR GC and chlamydia and I will continue  Hady P Khatoon's Cabenuva injections  Stress and caregiver fatigue: He is endeavoring to get as much support for himself and his mother as possible.  Coronary disease continue beta-blocker ACE inhibitor and statin.   CAD: continue  beta blocker, ACE and statin  Hyperlipidemia: continue crestor and he ison 40 as well as with zetia   Insulin-dependent diabetes on insulin follows with Carylon Perches   Counseling recommended hepatitis A vaccination was received today.

## 2023-02-15 ENCOUNTER — Other Ambulatory Visit: Payer: Self-pay

## 2023-02-15 ENCOUNTER — Encounter: Payer: Self-pay | Admitting: Infectious Disease

## 2023-02-15 ENCOUNTER — Ambulatory Visit: Payer: 59 | Admitting: Infectious Disease

## 2023-02-15 VITALS — BP 120/82 | HR 84 | Resp 16 | Ht 66.0 in | Wt 191.3 lb

## 2023-02-15 DIAGNOSIS — Z23 Encounter for immunization: Secondary | ICD-10-CM

## 2023-02-15 DIAGNOSIS — B20 Human immunodeficiency virus [HIV] disease: Secondary | ICD-10-CM

## 2023-02-15 DIAGNOSIS — I2510 Atherosclerotic heart disease of native coronary artery without angina pectoris: Secondary | ICD-10-CM

## 2023-02-15 DIAGNOSIS — Z7185 Encounter for immunization safety counseling: Secondary | ICD-10-CM

## 2023-02-15 DIAGNOSIS — I10 Essential (primary) hypertension: Secondary | ICD-10-CM

## 2023-02-15 DIAGNOSIS — E1110 Type 2 diabetes mellitus with ketoacidosis without coma: Secondary | ICD-10-CM

## 2023-02-15 MED ORDER — CABOTEGRAVIR & RILPIVIRINE ER 600 & 900 MG/3ML IM SUER
1.0000 | Freq: Once | INTRAMUSCULAR | Status: AC
Start: 2023-02-15 — End: 2023-02-15
  Administered 2023-02-15: 1 via INTRAMUSCULAR

## 2023-02-16 ENCOUNTER — Other Ambulatory Visit: Payer: Self-pay

## 2023-02-17 ENCOUNTER — Other Ambulatory Visit: Payer: Self-pay

## 2023-02-19 LAB — HIV RNA, RTPCR W/R GT (RTI, PI,INT)
HIV 1 RNA Quant: <20 {copies}/mL — AB
HIV-1 RNA Quant, Log: <1.3 {Log} — AB

## 2023-02-22 ENCOUNTER — Other Ambulatory Visit: Payer: Self-pay

## 2023-03-01 ENCOUNTER — Other Ambulatory Visit (HOSPITAL_COMMUNITY): Payer: Self-pay

## 2023-03-01 DIAGNOSIS — Z794 Long term (current) use of insulin: Secondary | ICD-10-CM | POA: Diagnosis not present

## 2023-03-01 DIAGNOSIS — E559 Vitamin D deficiency, unspecified: Secondary | ICD-10-CM | POA: Diagnosis not present

## 2023-03-01 DIAGNOSIS — E1169 Type 2 diabetes mellitus with other specified complication: Secondary | ICD-10-CM | POA: Diagnosis not present

## 2023-03-01 DIAGNOSIS — Z6830 Body mass index (BMI) 30.0-30.9, adult: Secondary | ICD-10-CM | POA: Diagnosis not present

## 2023-03-01 DIAGNOSIS — E782 Mixed hyperlipidemia: Secondary | ICD-10-CM | POA: Diagnosis not present

## 2023-03-01 DIAGNOSIS — E669 Obesity, unspecified: Secondary | ICD-10-CM | POA: Diagnosis not present

## 2023-03-01 DIAGNOSIS — I152 Hypertension secondary to endocrine disorders: Secondary | ICD-10-CM | POA: Diagnosis not present

## 2023-03-01 MED ORDER — MOUNJARO 15 MG/0.5ML ~~LOC~~ SOAJ
15.0000 mg | SUBCUTANEOUS | 3 refills | Status: DC
  Filled 2023-03-01 (×2): qty 2, 28d supply, fill #0
  Filled 2023-04-09: qty 2, 28d supply, fill #1
  Filled 2023-05-05: qty 2, 28d supply, fill #2
  Filled 2023-06-17: qty 2, 28d supply, fill #3

## 2023-03-03 ENCOUNTER — Other Ambulatory Visit: Payer: Self-pay

## 2023-03-12 ENCOUNTER — Other Ambulatory Visit (HOSPITAL_COMMUNITY): Payer: Self-pay

## 2023-03-15 ENCOUNTER — Other Ambulatory Visit (HOSPITAL_COMMUNITY): Payer: Self-pay

## 2023-03-15 MED ORDER — ESCITALOPRAM OXALATE 20 MG PO TABS
20.0000 mg | ORAL_TABLET | Freq: Every day | ORAL | 3 refills | Status: DC
  Filled 2023-03-15: qty 30, 30d supply, fill #0
  Filled 2023-04-21: qty 30, 30d supply, fill #1
  Filled 2023-05-27: qty 30, 30d supply, fill #2
  Filled 2023-07-08: qty 30, 30d supply, fill #3

## 2023-03-16 ENCOUNTER — Other Ambulatory Visit (HOSPITAL_COMMUNITY): Payer: Self-pay

## 2023-03-22 ENCOUNTER — Other Ambulatory Visit (HOSPITAL_COMMUNITY): Payer: 59 | Attending: Oncology

## 2023-03-23 ENCOUNTER — Other Ambulatory Visit: Payer: Self-pay

## 2023-03-29 ENCOUNTER — Other Ambulatory Visit (HOSPITAL_COMMUNITY): Payer: Self-pay

## 2023-03-29 ENCOUNTER — Other Ambulatory Visit: Payer: Self-pay

## 2023-03-29 ENCOUNTER — Other Ambulatory Visit: Payer: Self-pay | Admitting: Pharmacist

## 2023-03-29 DIAGNOSIS — B20 Human immunodeficiency virus [HIV] disease: Secondary | ICD-10-CM

## 2023-03-29 MED ORDER — CABOTEGRAVIR & RILPIVIRINE ER 600 & 900 MG/3ML IM SUER
1.0000 | INTRAMUSCULAR | 5 refills | Status: DC
Start: 2023-03-29 — End: 2024-04-11
  Filled 2023-03-29: qty 6, 30d supply, fill #0
  Filled 2023-06-03: qty 6, 30d supply, fill #1
  Filled 2023-07-27: qty 6, 30d supply, fill #2
  Filled 2023-09-23: qty 6, 30d supply, fill #3
  Filled 2023-12-06: qty 6, 30d supply, fill #4
  Filled 2024-02-07: qty 6, 30d supply, fill #5

## 2023-03-29 NOTE — Progress Notes (Signed)
Specialty Pharmacy Refill Coordination Note  [name] is a 57 y.o. male assessed today regarding refills of clinic administered specialty medication(s) Cabotegravir & Rilpivirine   Clinic requested Courier to Provider Office   Delivery date: 04/08/23   Verified address: [facility] [address] [place] [address]   Medication will be filled on 04/07/23.

## 2023-04-07 ENCOUNTER — Other Ambulatory Visit: Payer: Self-pay

## 2023-04-07 NOTE — Progress Notes (Signed)
HPI: [name] is a 57 y.o. male who presents to the [facility] pharmacy clinic for [place] administration.  Patient Active Problem List   Diagnosis Date Noted   Vaccine counseling 06/14/2022   Thrombocytosis 05/28/2021   DKA (diabetic ketoacidosis) (HCC) 03/05/2020   High anion gap metabolic acidosis 03/05/2020   Leukocytosis 03/05/2020   Dehydration 03/05/2020   Hyponatremia 03/05/2020   Hyperkalemia 03/05/2020   Hyperglycemia due to diabetes mellitus (HCC) 03/05/2020   Intractable nausea and vomiting 03/05/2020   DKA, type 2 (HCC) 03/05/2020   Other fatigue 03/14/2018   Shortness of breath on exertion 03/14/2018   Uncontrolled type 2 diabetes mellitus with hyperglycemia (HCC) 02/08/2018   Mixed hyperlipidemia 02/08/2018   AKI (acute kidney injury) (HCC) 03/23/2017   Special screening for malignant neoplasms, colon 11/18/2016   Family history of colon cancer 11/18/2016   Lung nodule, multiple 09/27/2014   Poorly controlled type 2 diabetes mellitus with circulatory disorder (HCC) 09/27/2014   Early syphilis, 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   Hypercholesterolemia 01/25/2013   Ejection fraction    Essential hypertension, benign    Human immunodeficiency virus (HIV) disease (HCC) 09/11/2011   Kidney stone on left side 01/04/2011   DKA (diabetic ketoacidosis) (HCC) 01/04/2011   Coronary artery disease due to lipid rich plaque    Secondary syphilis    LFTs abnormal     Patient's Medications  New Prescriptions   No medications on file  Previous Medications   ASPIRIN 81 MG TABLET    Take 1 tablet (81 mg total) by mouth daily with breakfast.   BACLOFEN (LIORESAL) 10 MG TABLET    Take 1 tablet (10 mg total) by mouth at bedtime as needed.   CABOTEGRAVIR & RILPIVIRINE ER (CABENUVA) 600 & 900 MG/3ML INJECTION    Inject 1 kit into the muscle every 2 (two) months.   CHLORTHALIDONE (HYGROTON) 25 MG TABLET    Take 1 tablet (25 mg total) by mouth daily.   CONTINUOUS GLUCOSE SENSOR  (FREESTYLE LIBRE 14 DAY SENSOR) MISC    Use as directed for continuous glucose monitoring. Change every 14 days   CYANOCOBALAMIN (VITAMIN B12) 1000 MCG/ML INJECTION    Inject 1ml ( ) into the muscle once every 30 days   ERGOCALCIFEROL (VITAMIN D2) 1.25 MG (50000 UT) CAPSULE    Take 1 capsule by mouth once weekly   ESCITALOPRAM (LEXAPRO) 10 MG TABLET    Take 1 tablet (10 mg total) by mouth daily.   ESCITALOPRAM (LEXAPRO) 20 MG TABLET    Take 1 tablet (20 mg total) by mouth daily.   EZETIMIBE (ZETIA) 10 MG TABLET    Take 1 tablet (10 mg total) by mouth daily.   INSULIN DEGLUDEC (TRESIBA FLEXTOUCH) 100 UNIT/ML FLEXTOUCH PEN    Inject 45 Units into the skin 2 (two) times daily.   INSULIN GLARGINE (BASAGLAR KWIKPEN) 100 UNIT/ML    Inject 45 Units into the skin 2 (two) times daily.   INSULIN LISPRO (HUMALOG) 100 UNIT/ML KWIKPEN    Inject 35 Units into the skin 4 (four) times daily.   INSULIN PEN NEEDLE (TECHLITE PEN NEEDLES) 31G X 8 MM MISC    Use as directed   INSULIN PEN NEEDLE (UNIFINE PENTIPS) 31G X 5 MM MISC    Use as directed   INSULIN PEN NEEDLE 31G X 5 MM MISC    Use as directed   LISINOPRIL (ZESTRIL) 10 MG TABLET    Take 1 tablet (10 mg total) by mouth daily.   LORAZEPAM (  ATIVAN) 0.5 MG TABLET    Take 1 tablet (0.5 mg total) by mouth 2 (two) times daily as needed.   MUPIROCIN OINTMENT (BACTROBAN) 2 %    Apply topically twice daily   PROPRANOLOL (INDERAL) 20 MG TABLET    Take 1 tablet (20 mg total) by mouth 2 (two) times daily.   PROPRANOLOL (INDERAL) 20 MG TABLET    Take 1 tablet (20 mg total) by mouth 3 (three) times daily as needed.   ROSUVASTATIN (CRESTOR) 40 MG TABLET    Take 1 tablet by mouth daily.   SYRINGE-NEEDLE, DISP, 3 ML 25G X 1" 3 ML MISC    Use to inject Vitamin B12 into the muscle once a month   TIRZEPATIDE (MOUNJARO) 12.5 MG/0.5ML PEN    Inject 12.5 mg into the skin once a week.   TIRZEPATIDE (MOUNJARO) 12.5 MG/0.5ML PEN    Inject 12.5 mg into the skin once a week.    TIRZEPATIDE (MOUNJARO) 15 MG/0.5ML PEN    Inject 15 mg into the skin once a week.   TIRZEPATIDE (MOUNJARO) 15 MG/0.5ML PEN    Inject 15 mg into the skin once a week.   ZOLPIDEM (AMBIEN) 10 MG TABLET    Take 1 tablet (10 mg total) by mouth at bedtime as needed for sleep. 30 day prescription.  Modified Medications   No medications on file  Discontinued Medications   No medications on file    Allergies: Allergies  Allergen Reactions   Famotidine Other (See Comments)    Contraindicated with ODEFSEY Other reaction(s): Other (See Comments) Contraindicated with ODEFSEY   Prilosec [Omeprazole] Other (See Comments)    Contraindicated with RPV (lowers level of this ARV in ODEFSEY   Odefsey [Emtricitabine-Rilpivirine-Tenofovir Af]     Makes blood sugars run high    Labs: Lab Results  Component Value Date   HIV1RNAQUANT <20 DETECTED (A) 02/15/2023   HIV1RNAQUANT <20 (H) 10/15/2022   HIV1RNAQUANT <20 (H) 06/15/2022   CD4TABS 1,068 10/15/2022   CD4TABS 904 04/13/2022   CD4TABS 897 03/18/2021    RPR and STI Lab Results  Component Value Date   LABRPR NON-REACTIVE 10/15/2022   LABRPR NON-REACTIVE 08/18/2021   LABRPR NON-REACTIVE 03/18/2020   LABRPR NON-REACTIVE 03/14/2019   LABRPR NON-REACTIVE 02/22/2018    STI Results GC CT  10/15/2022  9:31 AM Negative  Negative   08/18/2021  9:21 AM Negative    Negative    Negative  Negative    Negative    Negative   02/22/2018 12:00 AM Negative  Negative   11/30/2016 12:00 AM Negative  Negative   10/28/2015 12:00 AM Negative  Negative   09/13/2014 12:00 AM Negative  Negative     Hepatitis B Lab Results  Component Value Date   HEPBSAB REACTIVE (A) 08/18/2021   HEPBSAG NON-REACTIVE 08/18/2021   Hepatitis C Lab Results  Component Value Date   HEPCAB NON-REACTIVE 10/15/2022   Hepatitis A Lab Results  Component Value Date   HAV NON-REACTIVE 08/18/2021   Lipids: Lab Results  Component Value Date   CHOL 225 (H) 10/15/2022   TRIG  140 10/15/2022   HDL 52 10/15/2022   CHOLHDL 4.3 10/15/2022   VLDL 31 06/23/2022   LDLCALC 147 (H) 10/15/2022    TARGET DATE: The 25th  Assessment: [name] presents today for his maintenance Cabenuva injections. Past injections were tolerated well without issues. Last HIV RNA was undetectable in September; will defer today. Already received updated flu and COVID vaccines. Up-to-date on all other  vaccines.   Administered cabotegravir 600mg /87mL in left upper outer quadrant of the gluteal muscle. Administered rilpivirine 900 mg/57mL in the right upper outer quadrant of the gluteal muscle. No issues with injections. He will follow up in 2 months for next set of injections.  Plan: - Cabenuva injections administered - Next injections scheduled for 06/14/23 with Dr. Daiva Eves and 08/10/23 and 10/11/23 with me - Call with any issues or questions  Stacee Earp L. Nekayla Heider, PharmD, BCIDP, AAHIVP, CPP Clinical Pharmacist Practitioner Infectious Diseases Clinical Pharmacist [facility]

## 2023-04-08 ENCOUNTER — Telehealth: Payer: Self-pay

## 2023-04-08 NOTE — Telephone Encounter (Signed)
[facility] Patient Advocate Encounter  Patient's medications CABENUVA have been couriered to [facility] from [facility] and will be administered at the patients appointment on 04/12/23.  Kae Heller, CPhT Specialty Pharmacy Patient [facility] Phone: [phone] Fax:  [phone]

## 2023-04-09 ENCOUNTER — Other Ambulatory Visit (HOSPITAL_COMMUNITY): Payer: Self-pay

## 2023-04-12 ENCOUNTER — Ambulatory Visit (INDEPENDENT_AMBULATORY_CARE_PROVIDER_SITE_OTHER): Payer: 59 | Admitting: Pharmacist

## 2023-04-12 ENCOUNTER — Other Ambulatory Visit (HOSPITAL_COMMUNITY): Payer: Self-pay

## 2023-04-12 ENCOUNTER — Other Ambulatory Visit: Payer: Self-pay

## 2023-04-12 DIAGNOSIS — B20 Human immunodeficiency virus [HIV] disease: Secondary | ICD-10-CM | POA: Diagnosis not present

## 2023-04-12 MED ORDER — CABOTEGRAVIR & RILPIVIRINE ER 600 & 900 MG/3ML IM SUER
1.0000 | Freq: Once | INTRAMUSCULAR | Status: AC
  Administered 2023-04-12: 1 via INTRAMUSCULAR

## 2023-04-12 MED ORDER — PROPRANOLOL HCL 20 MG PO TABS
20.0000 mg | ORAL_TABLET | Freq: Three times a day (TID) | ORAL | 0 refills | Status: DC | PRN
  Filled 2023-04-12: qty 90, 30d supply, fill #0

## 2023-04-13 ENCOUNTER — Other Ambulatory Visit (HOSPITAL_COMMUNITY): Payer: Self-pay

## 2023-04-13 MED ORDER — FREESTYLE LIBRE 3 PLUS SENSOR MISC
4 refills | Status: DC
  Filled 2023-04-13: qty 6, 84d supply, fill #0
  Filled 2023-07-08: qty 6, 84d supply, fill #1
  Filled 2023-10-19: qty 6, 84d supply, fill #2
  Filled 2024-01-07: qty 6, 84d supply, fill #3
  Filled 2024-03-24: qty 6, 84d supply, fill #4

## 2023-04-14 ENCOUNTER — Other Ambulatory Visit: Payer: Self-pay

## 2023-04-15 ENCOUNTER — Other Ambulatory Visit (HOSPITAL_COMMUNITY): Payer: Self-pay

## 2023-04-15 ENCOUNTER — Other Ambulatory Visit: Payer: Self-pay

## 2023-04-16 ENCOUNTER — Other Ambulatory Visit (HOSPITAL_COMMUNITY): Payer: Self-pay

## 2023-04-16 ENCOUNTER — Other Ambulatory Visit: Payer: Self-pay

## 2023-04-16 ENCOUNTER — Encounter: Payer: Self-pay | Admitting: Pharmacist

## 2023-04-21 ENCOUNTER — Other Ambulatory Visit: Payer: Self-pay

## 2023-04-23 ENCOUNTER — Other Ambulatory Visit (HOSPITAL_COMMUNITY): Payer: Self-pay

## 2023-05-05 ENCOUNTER — Other Ambulatory Visit (HOSPITAL_COMMUNITY): Payer: Self-pay

## 2023-05-12 ENCOUNTER — Other Ambulatory Visit: Payer: Self-pay

## 2023-05-27 ENCOUNTER — Other Ambulatory Visit (HOSPITAL_BASED_OUTPATIENT_CLINIC_OR_DEPARTMENT_OTHER): Payer: Self-pay

## 2023-05-28 ENCOUNTER — Other Ambulatory Visit (HOSPITAL_COMMUNITY): Payer: Self-pay

## 2023-05-31 ENCOUNTER — Other Ambulatory Visit (HOSPITAL_COMMUNITY): Payer: Self-pay

## 2023-06-01 ENCOUNTER — Other Ambulatory Visit: Payer: Self-pay

## 2023-06-03 ENCOUNTER — Other Ambulatory Visit: Payer: Self-pay

## 2023-06-03 ENCOUNTER — Other Ambulatory Visit (HOSPITAL_COMMUNITY): Payer: Self-pay

## 2023-06-03 NOTE — Progress Notes (Signed)
 Specialty Pharmacy Refill Coordination Note  [name] is a 58 y.o. male assessed today regarding refills of clinic administered specialty medication(s) Cabotegravir  & Rilpivirine  (CABENUVA )   Clinic requested Courier to Provider Office   Delivery date: 06/09/23   Verified address: [address] [place] [address]   Medication will be filled on 06/08/23.

## 2023-06-04 ENCOUNTER — Other Ambulatory Visit: Payer: Self-pay

## 2023-06-07 ENCOUNTER — Other Ambulatory Visit (HOSPITAL_COMMUNITY): Payer: Self-pay

## 2023-06-08 ENCOUNTER — Other Ambulatory Visit: Payer: Self-pay

## 2023-06-09 ENCOUNTER — Other Ambulatory Visit: Payer: Self-pay

## 2023-06-09 ENCOUNTER — Telehealth: Payer: Self-pay

## 2023-06-09 NOTE — Telephone Encounter (Signed)
 [facility] Patient Advocate Encounter  Patient's medications CABENUVA  have been couriered to [facility] from [facility] and will be administered at the patients appointment on 06/14/23.  Verline Glow, CPhT Specialty Pharmacy Patient [facility] for Infectious Disease Phone: [phone] Fax:  [phone]

## 2023-06-13 NOTE — Progress Notes (Unsigned)
Subjective:  Chief complaint follow-up for HIV disease on medications    Patient ID: [name], male    DOB: 1965/08/20, 58 y.o.   MRN: [id number]  HPI  Discussed the use of AI scribe software for clinical note transcription with the patient, who gave verbal consent to proceed.  History of Present Illness   The patient, with a history of HIV, diabetes, and heart disease, DM presents for a scheduled Cabenuva injection. They report a challenging year marked by the loss of nine family members, causing significant emotional distress. They also express concern about their mother's increasing forgetfulness and signs of dementia, which is causing tension in their relationship. The patient is the primary caregiver for their mother and is feeling the strain of this responsibility.  Regarding their own health, the patient is on a regimen of Cabenuva for HIV management, Lantus for diabetes, and metoprolol for heart disease. They also take Crestor for cholesterol management and vitamin B12. They report no recent changes in their health status or medication side effects.       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   Syphilitic hepatitis    Type 2 diabetes mellitus (HCC)    Vaccine counseling 06/14/2022    Past Surgical History:  Procedure Laterality Date   BIOPSY  12/28/2019   Procedure: BIOPSY;  Surgeon: Malissa Hippo, MD;  [facility];  Service: Endoscopy;;   CHOLECYSTECTOMY OPEN  07/2001   COLONOSCOPY N/A 05/28/2017    Procedure: COLONOSCOPY;  Surgeon: Malissa Hippo, MD;  [facility];  Service: Endoscopy;  Laterality: N/A;  200 - pt to prep in Endo   CORONARY ANGIOPLASTY WITH STENT PLACEMENT  03/2006; 10/2007;    ESOPHAGEAL DILATION N/A 12/28/2019   Procedure: ESOPHAGEAL DILATION;  Surgeon: Malissa Hippo, MD;  [facility];  Service: Endoscopy;  Laterality: N/A;   ESOPHAGOGASTRODUODENOSCOPY N/A 12/28/2019   Procedure: ESOPHAGOGASTRODUODENOSCOPY (EGD);  Surgeon: Malissa Hippo, MD;  [facility];  Service: Endoscopy;  Laterality: N/A;  125, per Dewayne Hatch moved to 10:55 pt aware   HERNIA REPAIR     LAPAROSCOPIC INCISIONAL / UMBILICAL / VENTRAL HERNIA REPAIR  07/2001   UHR   POLYPECTOMY  05/28/2017   Procedure: POLYPECTOMY;  Surgeon: Malissa Hippo, MD;  [facility];  Service: Endoscopy;;  colon    Family History  Problem Relation Age of Onset   Heart attack Father        7 heart attacks   CAD Father    Alcoholism Father    Hyperlipidemia Mother    Hypertension Mother    Anemia Other    Diabetes Other    CAD Sister    Diabetes Sister       Social History   Socioeconomic History   Marital status: Single    Spouse name: Not on file   Number of children: 0   Years of education: Not on file  Highest education level: Not on file  Occupational History   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  Tobacco Use   Smoking status: Never   Smokeless tobacco: Never  Vaping Use   Vaping status: Never Used  Substance and Sexual Activity   Alcohol use: Not Currently    Alcohol/week: 0.0 - 1.0 standard drinks of alcohol    Comment: Social drinker, vodka, once a month   Drug use: No   Sexual activity: Not Currently    Partners: Male    Comment: declined condoms  Other Topics Concern   Not on file  Social History Narrative   Not on file   Social Drivers of Health   Financial Resource Strain: Not on file  Food Insecurity: Not  on file  Transportation Needs: Not on file  Physical Activity: Not on file  Stress: Not on file  Social Connections: Not on file    Allergies  Allergen Reactions   Famotidine Other (See Comments)    Contraindicated with ODEFSEY Other reaction(s): Other (See Comments) Contraindicated with ODEFSEY   Prilosec [Omeprazole] Other (See Comments)    Contraindicated with RPV (lowers level of this ARV in ODEFSEY   Odefsey [Emtricitabine-Rilpivirine-Tenofovir Af]     Makes blood sugars run high     Current Outpatient Medications:    aspirin 81 MG tablet, Take 1 tablet (81 mg total) by mouth daily with breakfast., Disp: 30 tablet, Rfl: 3   baclofen (LIORESAL) 10 MG tablet, Take 1 tablet (10 mg total) by mouth at bedtime as needed., Disp: 30 tablet, Rfl: 0   cabotegravir & rilpivirine ER (CABENUVA) 600 & 900 MG/3ML injection, Inject 1 kit into the muscle every 2 (two) months., Disp: 6 mL, Rfl: 5   chlorthalidone (HYGROTON) 25 MG tablet, Take 1 tablet (25 mg total) by mouth daily., Disp: 90 tablet, Rfl: 3   Continuous Glucose Sensor (FREESTYLE LIBRE 14 DAY SENSOR) MISC, Use as directed for continuous glucose monitoring. Change every 14 days, Disp: 2 each, Rfl: 12   Continuous Glucose Sensor (FREESTYLE LIBRE 3 PLUS SENSOR) MISC, Use as directed, Disp: 6 each, Rfl: 4   cyanocobalamin (VITAMIN B12) 1000 MCG/ML injection, Inject 1ml ( ) into the muscle once every 30 days, Disp: 1 mL, Rfl: 2   ergocalciferol (VITAMIN D2) 1.25 MG (50000 UT) capsule, Take 1 capsule by mouth once weekly, Disp: 4 capsule, Rfl: 0   escitalopram (LEXAPRO) 10 MG tablet, Take 1 tablet (10 mg total) by mouth daily., Disp: 90 tablet, Rfl: 3   escitalopram (LEXAPRO) 20 MG tablet, Take 1 tablet (20 mg total) by mouth daily., Disp: 30 tablet, Rfl: 3   ezetimibe (ZETIA) 10 MG tablet, Take 1 tablet (10 mg total) by mouth daily., Disp: 90 tablet, Rfl: 3   insulin degludec (TRESIBA FLEXTOUCH) 100 UNIT/ML FlexTouch Pen, Inject 45  Units into the skin 2 (two) times daily., Disp: 30 mL, Rfl: 12   Insulin Glargine (BASAGLAR KWIKPEN) 100 UNIT/ML, Inject 45 Units into the skin 2 (two) times daily., Disp: , Rfl:    insulin lispro (HUMALOG) 100 UNIT/ML KwikPen, Inject 35 Units into the skin 4 (four) times daily., Disp: 90 mL, Rfl: 4   Insulin Pen Needle (TECHLITE PEN NEEDLES) 31G X 8 MM MISC, Use as directed, Disp: 15000 each, Rfl: 12   Insulin Pen Needle (UNIFINE PENTIPS) 31G X 5 MM MISC, Use as directed, Disp: 150 each, Rfl: 4   Insulin Pen Needle 31G X 5 MM MISC, Use as directed, Disp:  150 each, Rfl: 4   lisinopril (ZESTRIL) 10 MG tablet, Take 1 tablet (10 mg total) by mouth daily., Disp: 90 tablet, Rfl: 3   LORazepam (ATIVAN) 0.5 MG tablet, Take 1 tablet (0.5 mg total) by mouth 2 (two) times daily as needed., Disp: 30 tablet, Rfl: 3   mupirocin ointment (BACTROBAN) 2 %, Apply topically twice daily, Disp: 22 g, Rfl: 2   propranolol (INDERAL) 20 MG tablet, Take 1 tablet (20 mg total) by mouth 3 (three) times daily as needed., Disp: 90 tablet, Rfl: 3   propranolol (INDERAL) 20 MG tablet, Take 1 tablet (20 mg total) by mouth 3 (three) times daily as needed., Disp: 90 tablet, Rfl: 0   rosuvastatin (CRESTOR) 40 MG tablet, Take 1 tablet by mouth daily., Disp: 90 tablet, Rfl: 3   SYRINGE-NEEDLE, DISP, 3 ML 25G X 1" 3 ML MISC, Use to inject Vitamin B12 into the muscle once a month, Disp: 10 each, Rfl: 0   tirzepatide (MOUNJARO) 12.5 MG/0.5ML Pen, Inject 12.5 mg into the skin once a week., Disp: 2 mL, Rfl: 1   tirzepatide (MOUNJARO) 12.5 MG/0.5ML Pen, Inject 12.5 mg into the skin once a week., Disp: 6 mL, Rfl: 1   tirzepatide (MOUNJARO) 15 MG/0.5ML Pen, Inject 15 mg into the skin once a week., Disp: 2 mL, Rfl: 0   tirzepatide (MOUNJARO) 15 MG/0.5ML Pen, Inject 15 mg into the skin once a week., Disp: 2 mL, Rfl: 3   zolpidem (AMBIEN) 10 MG tablet, Take 1 tablet (10 mg total) by mouth at bedtime as needed for sleep. 30 day prescription.,  Disp: 15 tablet, Rfl: 0   Review of Systems  Constitutional:  Negative for activity change, appetite change, chills, diaphoresis, fatigue, fever and unexpected weight change.  HENT:  Negative for congestion, rhinorrhea, sinus pressure, sneezing, sore throat and trouble swallowing.   Eyes:  Negative for photophobia and visual disturbance.  Respiratory:  Negative for cough, chest tightness, shortness of breath, wheezing and stridor.   Cardiovascular:  Negative for chest pain, palpitations and leg swelling.  Gastrointestinal:  Negative for abdominal distention, abdominal pain, anal bleeding, blood in stool, constipation, diarrhea, nausea and vomiting.  Genitourinary:  Negative for difficulty urinating, dysuria, flank pain and hematuria.  Musculoskeletal:  Negative for arthralgias, back pain, gait problem, joint swelling and myalgias.  Skin:  Negative for color change, pallor, rash and wound.  Neurological:  Negative for dizziness, tremors, weakness and light-headedness.  Hematological:  Negative for adenopathy. Does not bruise/bleed easily.  Psychiatric/Behavioral:  Negative for agitation, behavioral problems, confusion, decreased concentration, dysphoric mood and sleep disturbance.        Objective:   Physical Exam Constitutional:      Appearance: He is well-developed.  HENT:     Head: Normocephalic and atraumatic.  Eyes:     Conjunctiva/sclera: Conjunctivae normal.  Cardiovascular:     Rate and Rhythm: Normal rate and regular rhythm.  Pulmonary:     Effort: Pulmonary effort is normal. No respiratory distress.     Breath sounds: No wheezing.  Abdominal:     General: There is no distension.     Palpations: Abdomen is soft.  Musculoskeletal:        General: No tenderness. Normal range of motion.     Cervical back: Normal range of motion and neck supple.  Skin:    General: Skin is warm and dry.     Coloration: Skin is not pale.     Findings: No erythema or rash.  Neurological:  General: No focal deficit present.     Mental Status: He is alert and oriented to person, place, and time.  Psychiatric:        Mood and Affect: Mood normal.        Behavior: Behavior normal.        Thought Content: Thought content normal.        Judgment: Judgment normal.           Assessment & Plan:  Assessment and Plan    HIV Stable on Cabenuva injections. No reported side effects or complications. -Continue Cabenuva injections as scheduled. -Check viral load today, CD4 and routine labs  Diabetes On long-acting insulin (Lantus) and Mounjaro for weight loss. -Continue current regimen.  Hyperlipidemia On Crestor and possibly Zetia (ezetimibe). -Continue current regimen  Anal Cancer Screening No prior screening for anal cancer. Discussed the importance of screening due to history of anal receptive intercourse. -Perform anal Pap smear today.  General Health Maintenance Up to date on vaccinations including COVID-19. -Continue routine health maintenance.  Psychosocial Significant grief and loss with nine family members passing away last year. Also, managing mother's early dementia. -Consider referral to mental health services for grief counseling and support in managing mother's dementia.     CAD continuing on metoprolol, crestor and zetia

## 2023-06-14 ENCOUNTER — Ambulatory Visit (INDEPENDENT_AMBULATORY_CARE_PROVIDER_SITE_OTHER): Payer: 59 | Admitting: Infectious Disease

## 2023-06-14 ENCOUNTER — Other Ambulatory Visit: Payer: Self-pay

## 2023-06-14 ENCOUNTER — Other Ambulatory Visit (HOSPITAL_COMMUNITY)
Admission: RE | Admit: 2023-06-14 | Discharge: 2023-06-14 | Disposition: A | Discharge status: Home / Self Care | Payer: 59 | Source: Ambulatory Visit | Attending: Infectious Disease | Admitting: Infectious Disease

## 2023-06-14 ENCOUNTER — Encounter: Payer: Self-pay | Admitting: Infectious Disease

## 2023-06-14 VITALS — BP 155/92 | HR 99 | Resp 16 | Ht 66.0 in | Wt 196.0 lb

## 2023-06-14 DIAGNOSIS — B20 Human immunodeficiency virus [HIV] disease: Secondary | ICD-10-CM

## 2023-06-14 DIAGNOSIS — Z7185 Encounter for immunization safety counseling: Secondary | ICD-10-CM | POA: Insufficient documentation

## 2023-06-14 DIAGNOSIS — I2510 Atherosclerotic heart disease of native coronary artery without angina pectoris: Secondary | ICD-10-CM | POA: Insufficient documentation

## 2023-06-14 DIAGNOSIS — I10 Essential (primary) hypertension: Secondary | ICD-10-CM

## 2023-06-14 DIAGNOSIS — I2583 Coronary atherosclerosis due to lipid rich plaque: Secondary | ICD-10-CM | POA: Insufficient documentation

## 2023-06-14 DIAGNOSIS — E1110 Type 2 diabetes mellitus with ketoacidosis without coma: Secondary | ICD-10-CM | POA: Insufficient documentation

## 2023-06-14 DIAGNOSIS — R5383 Other fatigue: Secondary | ICD-10-CM | POA: Insufficient documentation

## 2023-06-14 DIAGNOSIS — E782 Mixed hyperlipidemia: Secondary | ICD-10-CM

## 2023-06-14 MED ORDER — CABOTEGRAVIR & RILPIVIRINE ER 600 & 900 MG/3ML IM SUER
1.0000 | Freq: Once | INTRAMUSCULAR | Status: AC
  Administered 2023-06-14: 1 via INTRAMUSCULAR

## 2023-06-15 ENCOUNTER — Telehealth: Payer: Self-pay

## 2023-06-15 LAB — URINE CYTOLOGY ANCILLARY ONLY
Chlamydia: NEGATIVE
Comment: NEGATIVE
Comment: NORMAL
Neisseria Gonorrhea: NEGATIVE

## 2023-06-15 LAB — T-HELPER CELLS (CD4) COUNT (NOT AT ARMC)
CD4 % Helper T Cell: 53 % (ref 33–65)
CD4 T Cell Abs: 938 /uL (ref 400–1790)

## 2023-06-15 NOTE — Telephone Encounter (Signed)
-----   Message from Humphrey sent at 06/15/2023  2:57 PM EST ----- Regarding: FW: Normal anal pap ----- Message ----- From: Janace Hoard Lab Results In Sent: 06/14/2023  11:06 PM EST To: Randall Hiss, MD

## 2023-06-17 ENCOUNTER — Other Ambulatory Visit (HOSPITAL_COMMUNITY): Payer: Self-pay

## 2023-06-17 DIAGNOSIS — E119 Type 2 diabetes mellitus without complications: Secondary | ICD-10-CM | POA: Diagnosis not present

## 2023-06-17 DIAGNOSIS — E782 Mixed hyperlipidemia: Secondary | ICD-10-CM | POA: Diagnosis not present

## 2023-06-17 DIAGNOSIS — Z135 Encounter for screening for eye and ear disorders: Secondary | ICD-10-CM | POA: Diagnosis not present

## 2023-06-17 DIAGNOSIS — I10 Essential (primary) hypertension: Secondary | ICD-10-CM | POA: Diagnosis not present

## 2023-06-17 DIAGNOSIS — Z8639 Personal history of other endocrine, nutritional and metabolic disease: Secondary | ICD-10-CM | POA: Diagnosis not present

## 2023-06-17 DIAGNOSIS — E118 Type 2 diabetes mellitus with unspecified complications: Secondary | ICD-10-CM | POA: Diagnosis not present

## 2023-06-17 DIAGNOSIS — H52223 Regular astigmatism, bilateral: Secondary | ICD-10-CM | POA: Diagnosis not present

## 2023-06-17 DIAGNOSIS — H524 Presbyopia: Secondary | ICD-10-CM | POA: Diagnosis not present

## 2023-06-17 DIAGNOSIS — H5213 Myopia, bilateral: Secondary | ICD-10-CM | POA: Diagnosis not present

## 2023-06-17 DIAGNOSIS — Z636 Dependent relative needing care at home: Secondary | ICD-10-CM | POA: Diagnosis not present

## 2023-06-17 DIAGNOSIS — E663 Overweight: Secondary | ICD-10-CM | POA: Diagnosis not present

## 2023-06-18 LAB — CYTOLOGY - PAP
Adequacy: ABSENT
Diagnosis: NEGATIVE

## 2023-06-19 LAB — COMPLETE METABOLIC PANEL WITH GFR
AG Ratio: 1.5 (calc) (ref 1.0–2.5)
ALT: 13 U/L (ref 9–46)
AST: 13 U/L (ref 10–35)
Albumin: 3.9 g/dL (ref 3.6–5.1)
Alkaline phosphatase (APISO): 67 U/L (ref 35–144)
BUN: 14 mg/dL (ref 7–25)
CO2: 28 mmol/L (ref 20–32)
Calcium: 9.2 mg/dL (ref 8.6–10.3)
Chloride: 106 mmol/L (ref 98–110)
Creat: 0.72 mg/dL (ref 0.70–1.30)
Globulin: 2.6 g/dL (ref 1.9–3.7)
Glucose, Bld: 89 mg/dL (ref 65–99)
Potassium: 4.1 mmol/L (ref 3.5–5.3)
Sodium: 142 mmol/L (ref 135–146)
Total Bilirubin: 0.5 mg/dL (ref 0.2–1.2)
Total Protein: 6.5 g/dL (ref 6.1–8.1)
eGFR: 107 mL/min/{1.73_m2} (ref >=60)

## 2023-06-19 LAB — HIV RNA, RTPCR W/R GT (RTI, PI,INT)
HIV 1 RNA Quant: <20 {copies}/mL — AB
HIV-1 RNA Quant, Log: <1.3 {Log} — AB

## 2023-06-19 LAB — LIPID PANEL
Cholesterol: 239 mg/dL — ABNORMAL HIGH (ref <200)
HDL: 51 mg/dL (ref >=40)
LDL Cholesterol (Calc): 154 mg/dL — ABNORMAL HIGH
Non-HDL Cholesterol (Calc): 188 mg/dL — ABNORMAL HIGH (ref <130)
Total CHOL/HDL Ratio: 4.7 (calc) (ref <5.0)
Triglycerides: 204 mg/dL — ABNORMAL HIGH (ref <150)

## 2023-06-19 LAB — CBC WITH DIFFERENTIAL/PLATELET
Absolute Lymphocytes: 1855 {cells}/uL (ref 850–3900)
Absolute Monocytes: 827 {cells}/uL (ref 200–950)
Basophils Absolute: 42 {cells}/uL (ref 0–200)
Basophils Relative: 0.4 %
Eosinophils Absolute: 127 {cells}/uL (ref 15–500)
Eosinophils Relative: 1.2 %
HCT: 44.6 % (ref 38.5–50.0)
Hemoglobin: 14.7 g/dL (ref 13.2–17.1)
MCH: 27.9 pg (ref 27.0–33.0)
MCHC: 33 g/dL (ref 32.0–36.0)
MCV: 84.6 fL (ref 80.0–100.0)
MPV: 9 fL (ref 7.5–12.5)
Monocytes Relative: 7.8 %
Neutro Abs: 7749 {cells}/uL (ref 1500–7800)
Neutrophils Relative %: 73.1 %
Platelets: 334 10*3/uL (ref 140–400)
RBC: 5.27 10*6/uL (ref 4.20–5.80)
RDW: 13 % (ref 11.0–15.0)
Total Lymphocyte: 17.5 %
WBC: 10.6 10*3/uL (ref 3.8–10.8)

## 2023-06-19 LAB — RPR: RPR Ser Ql: NONREACTIVE

## 2023-07-08 ENCOUNTER — Other Ambulatory Visit (HOSPITAL_COMMUNITY): Payer: Self-pay

## 2023-07-20 ENCOUNTER — Other Ambulatory Visit (HOSPITAL_COMMUNITY): Payer: Self-pay

## 2023-07-21 ENCOUNTER — Other Ambulatory Visit (HOSPITAL_COMMUNITY): Payer: Self-pay

## 2023-07-27 ENCOUNTER — Other Ambulatory Visit (HOSPITAL_COMMUNITY): Payer: Self-pay

## 2023-07-27 ENCOUNTER — Other Ambulatory Visit: Payer: Self-pay

## 2023-07-27 NOTE — Progress Notes (Signed)
 Specialty Pharmacy Refill Coordination Note  [name] is a 58 y.o. male assessed today regarding refills of clinic administered specialty medication(s) Cabotegravir & Rilpivirine [facility])   Clinic requested Courier to Provider Office   Delivery date: 08/05/23   Verified address: [address] [place] [address]   Medication will be filled on 08/04/23.

## 2023-08-02 ENCOUNTER — Other Ambulatory Visit: Payer: Self-pay

## 2023-08-02 ENCOUNTER — Other Ambulatory Visit (HOSPITAL_COMMUNITY): Payer: Self-pay

## 2023-08-03 ENCOUNTER — Other Ambulatory Visit (HOSPITAL_COMMUNITY): Payer: Self-pay

## 2023-08-03 ENCOUNTER — Other Ambulatory Visit: Payer: Self-pay

## 2023-08-03 MED ORDER — ESCITALOPRAM OXALATE 20 MG PO TABS
20.0000 mg | ORAL_TABLET | Freq: Every day | ORAL | 3 refills | Status: DC
  Filled 2023-08-03: qty 30, 30d supply, fill #0
  Filled 2023-09-01: qty 30, 30d supply, fill #1
  Filled 2023-12-09: qty 30, 30d supply, fill #2
  Filled 2024-01-03: qty 30, 30d supply, fill #3

## 2023-08-04 ENCOUNTER — Other Ambulatory Visit (HOSPITAL_COMMUNITY): Payer: Self-pay

## 2023-08-04 ENCOUNTER — Other Ambulatory Visit: Payer: Self-pay

## 2023-08-05 ENCOUNTER — Telehealth: Payer: Self-pay

## 2023-08-05 NOTE — Telephone Encounter (Signed)
 [facility] Patient Advocate Encounter  Patient's medications CABENUVA have been couriered to [facility] from [facility] and will be administered at the patients appointment on 08/10/23.  Kae Heller, CPhT Specialty Pharmacy Patient [facility] for Infectious Disease Phone: [phone] Fax:  [phone]

## 2023-08-06 ENCOUNTER — Other Ambulatory Visit (HOSPITAL_COMMUNITY): Payer: Self-pay

## 2023-08-06 ENCOUNTER — Other Ambulatory Visit: Payer: Self-pay

## 2023-08-06 MED ORDER — MOUNJARO 15 MG/0.5ML ~~LOC~~ SOAJ
15.0000 mg | SUBCUTANEOUS | 3 refills | Status: DC
Start: 2023-08-06 — End: 2024-01-04
  Filled 2023-08-06: qty 2, 28d supply, fill #0
  Filled 2023-09-01: qty 2, 28d supply, fill #1
  Filled 2023-10-19: qty 2, 28d supply, fill #2
  Filled 2023-11-17 – 2023-12-09 (×2): qty 2, 28d supply, fill #3

## 2023-08-09 NOTE — Progress Notes (Unsigned)
 HPI: [name] is a 58 y.o. male who presents to the [facility] pharmacy clinic for [place] administration.  Patient Active Problem List   Diagnosis Date Noted   Vaccine counseling 06/14/2022   Thrombocytosis 05/28/2021   DKA (diabetic ketoacidosis) (HCC) 03/05/2020   High anion gap metabolic acidosis 03/05/2020   Leukocytosis 03/05/2020   Dehydration 03/05/2020   Hyponatremia 03/05/2020   Hyperkalemia 03/05/2020   Hyperglycemia due to diabetes mellitus (HCC) 03/05/2020   Intractable nausea and vomiting 03/05/2020   DKA, type 2 (HCC) 03/05/2020   Other fatigue 03/14/2018   Shortness of breath on exertion 03/14/2018   Uncontrolled type 2 diabetes mellitus with hyperglycemia (HCC) 02/08/2018   Mixed hyperlipidemia 02/08/2018   AKI (acute kidney injury) (HCC) 03/23/2017   Special screening for malignant neoplasms, colon 11/18/2016   Family history of colon cancer 11/18/2016   Lung nodule, multiple 09/27/2014   Poorly controlled type 2 diabetes mellitus with circulatory disorder (HCC) 09/27/2014   Early syphilis, 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   Hypercholesterolemia 01/25/2013   Ejection fraction    Essential hypertension, benign    Human immunodeficiency virus (HIV) disease (HCC) 09/11/2011   Kidney stone on left side 01/04/2011   DKA (diabetic ketoacidosis) (HCC) 01/04/2011   Coronary artery disease due to lipid rich plaque    Secondary syphilis    LFTs abnormal     Patient's Medications  New Prescriptions   No medications on file  Previous Medications   ASPIRIN 81 MG TABLET    Take 1 tablet (81 mg total) by mouth daily with breakfast.   BACLOFEN (LIORESAL) 10 MG TABLET    Take 1 tablet (10 mg total) by mouth at bedtime as needed.   CABOTEGRAVIR & RILPIVIRINE ER (CABENUVA) 600 & 900 MG/3ML INJECTION    Inject 1 kit into the muscle every 2 (two) months.   CHLORTHALIDONE (HYGROTON) 25 MG TABLET    Take 1 tablet (25 mg total) by mouth daily.   CONTINUOUS GLUCOSE SENSOR  (FREESTYLE LIBRE 14 DAY SENSOR) MISC    Use as directed for continuous glucose monitoring. Change every 14 days   CONTINUOUS GLUCOSE SENSOR (FREESTYLE LIBRE 3 PLUS SENSOR) MISC    Use as directed   CYANOCOBALAMIN (VITAMIN B12) 1000 MCG/ML INJECTION    Inject 1ml ( ) into the muscle once every 30 days   ERGOCALCIFEROL (VITAMIN D2) 1.25 MG (50000 UT) CAPSULE    Take 1 capsule by mouth once weekly   ESCITALOPRAM (LEXAPRO) 10 MG TABLET    Take 1 tablet (10 mg total) by mouth daily.   ESCITALOPRAM (LEXAPRO) 20 MG TABLET    Take 1 tablet (20 mg total) by mouth daily.   EZETIMIBE (ZETIA) 10 MG TABLET    Take 1 tablet (10 mg total) by mouth daily.   INSULIN DEGLUDEC (TRESIBA FLEXTOUCH) 100 UNIT/ML FLEXTOUCH PEN    Inject 45 Units into the skin 2 (two) times daily.   INSULIN GLARGINE (BASAGLAR KWIKPEN) 100 UNIT/ML    Inject 45 Units into the skin 2 (two) times daily.   INSULIN LISPRO (HUMALOG) 100 UNIT/ML KWIKPEN    Inject 35 Units into the skin 4 (four) times daily.   INSULIN PEN NEEDLE (TECHLITE PEN NEEDLES) 31G X 8 MM MISC    Use as directed   INSULIN PEN NEEDLE (UNIFINE PENTIPS) 31G X 5 MM MISC    Use as directed   INSULIN PEN NEEDLE 31G X 5 MM MISC    Use as directed   LISINOPRIL (ZESTRIL) 10  MG TABLET    Take 1 tablet (10 mg total) by mouth daily.   LORAZEPAM (ATIVAN) 0.5 MG TABLET    Take 1 tablet (0.5 mg total) by mouth 2 (two) times daily as needed.   MUPIROCIN OINTMENT (BACTROBAN) 2 %    Apply topically twice daily   PROPRANOLOL (INDERAL) 20 MG TABLET    Take 1 tablet (20 mg total) by mouth 3 (three) times daily as needed.   PROPRANOLOL (INDERAL) 20 MG TABLET    Take 1 tablet (20 mg total) by mouth 3 (three) times daily as needed.   ROSUVASTATIN (CRESTOR) 40 MG TABLET    Take 1 tablet by mouth daily.   SYRINGE-NEEDLE, DISP, 3 ML 25G X 1" 3 ML MISC    Use to inject Vitamin B12 into the muscle once a month   TIRZEPATIDE (MOUNJARO) 12.5 MG/0.5ML PEN    Inject 12.5 mg into the skin once a  week.   TIRZEPATIDE (MOUNJARO) 12.5 MG/0.5ML PEN    Inject 12.5 mg into the skin once a week.   TIRZEPATIDE (MOUNJARO) 15 MG/0.5ML PEN    Inject 15 mg into the skin once a week.   TIRZEPATIDE (MOUNJARO) 15 MG/0.5ML PEN    Inject 15 mg into the skin once a week.   ZOLPIDEM (AMBIEN) 10 MG TABLET    Take 1 tablet (10 mg total) by mouth at bedtime as needed for sleep. 30 day prescription.  Modified Medications   No medications on file  Discontinued Medications   No medications on file    Allergies: Allergies  Allergen Reactions   Famotidine Other (See Comments)    Contraindicated with ODEFSEY Other reaction(s): Other (See Comments) Contraindicated with ODEFSEY   Prilosec [Omeprazole] Other (See Comments)    Contraindicated with RPV (lowers level of this ARV in ODEFSEY   Odefsey [Emtricitabine-Rilpivirine-Tenofovir Af]     Makes blood sugars run high    Labs: Lab Results  Component Value Date   HIV1RNAQUANT <20 DETECTED (A) 06/14/2023   HIV1RNAQUANT <20 DETECTED (A) 02/15/2023   HIV1RNAQUANT <20 (H) 10/15/2022   CD4TABS 938 06/14/2023   CD4TABS 1,068 10/15/2022   CD4TABS 904 04/13/2022    RPR and STI Lab Results  Component Value Date   LABRPR NON-REACTIVE 06/14/2023   LABRPR NON-REACTIVE 10/15/2022   LABRPR NON-REACTIVE 08/18/2021   LABRPR NON-REACTIVE 03/18/2020   LABRPR NON-REACTIVE 03/14/2019    STI Results GC CT  06/14/2023 10:51 AM Negative  Negative   10/15/2022  9:31 AM Negative  Negative   08/18/2021  9:21 AM Negative    Negative    Negative  Negative    Negative    Negative   02/22/2018 12:00 AM Negative  Negative   11/30/2016 12:00 AM Negative  Negative   10/28/2015 12:00 AM Negative  Negative   09/13/2014 12:00 AM Negative  Negative     Hepatitis B Lab Results  Component Value Date   HEPBSAB REACTIVE (A) 08/18/2021   HEPBSAG NON-REACTIVE 08/18/2021   Hepatitis C Lab Results  Component Value Date   HEPCAB NON-REACTIVE 10/15/2022   Hepatitis  A Lab Results  Component Value Date   HAV NON-REACTIVE 08/18/2021   Lipids: Lab Results  Component Value Date   CHOL 239 (H) 06/14/2023   TRIG 204 (H) 06/14/2023   HDL 51 06/14/2023   CHOLHDL 4.7 06/14/2023   VLDL 31 06/23/2022   LDLCALC 154 (H) 06/14/2023    TARGET DATE: The 25th  Assessment: Michaeal presents today for his maintenance [place]  injections. Past injections were tolerated well without issues. Last HIV RNA was <20 in January. Doing well with no issues today.  Administered cabotegravir 600mg /36mL in left upper outer quadrant of the gluteal muscle. Administered rilpivirine 900 mg/79mL in the right upper outer quadrant of the gluteal muscle. No issues with injections. He will follow up in 2 months for next set of injections.  Plan: - Cabenuva injections administered - Next injections scheduled for 10/11/23 with me, 12/14/23 with Dr. Daiva Eves, and 02/14/24 with me - Call with any issues or questions  Izaan Kingbird L. Jamin Panther, PharmD, BCIDP, AAHIVP, CPP Clinical Pharmacist Practitioner Infectious Diseases Clinical Pharmacist [facility]

## 2023-08-10 ENCOUNTER — Other Ambulatory Visit: Payer: Self-pay

## 2023-08-10 ENCOUNTER — Ambulatory Visit: Payer: 59 | Admitting: Pharmacist

## 2023-08-10 DIAGNOSIS — I2510 Atherosclerotic heart disease of native coronary artery without angina pectoris: Secondary | ICD-10-CM | POA: Diagnosis not present

## 2023-08-10 DIAGNOSIS — B20 Human immunodeficiency virus [HIV] disease: Secondary | ICD-10-CM

## 2023-08-10 DIAGNOSIS — E10641 Type 1 diabetes mellitus with hypoglycemia with coma: Secondary | ICD-10-CM | POA: Diagnosis not present

## 2023-08-10 MED ORDER — CABOTEGRAVIR & RILPIVIRINE ER 600 & 900 MG/3ML IM SUER
1.0000 | Freq: Once | INTRAMUSCULAR | Status: AC
  Administered 2023-08-10: 1 via INTRAMUSCULAR

## 2023-09-01 ENCOUNTER — Other Ambulatory Visit (HOSPITAL_COMMUNITY): Payer: Self-pay

## 2023-09-01 ENCOUNTER — Other Ambulatory Visit: Payer: Self-pay

## 2023-09-20 ENCOUNTER — Other Ambulatory Visit (HOSPITAL_COMMUNITY): Payer: Self-pay

## 2023-09-20 ENCOUNTER — Other Ambulatory Visit: Payer: Self-pay

## 2023-09-23 ENCOUNTER — Other Ambulatory Visit (HOSPITAL_COMMUNITY): Payer: Self-pay

## 2023-09-23 ENCOUNTER — Other Ambulatory Visit: Payer: Self-pay

## 2023-09-23 NOTE — Progress Notes (Signed)
 Specialty Pharmacy Refill Coordination Note  [name] is a 58 y.o. male assessed today regarding refills of clinic administered specialty medication(s) Cabotegravir  & Rilpivirine  (CABENUVA )   Clinic requested Courier to Provider Office   Delivery date: 10/04/23   Verified address: [address] [place] [address]   Medication will be filled on 10/01/23.

## 2023-09-24 ENCOUNTER — Other Ambulatory Visit: Payer: Self-pay

## 2023-09-24 ENCOUNTER — Other Ambulatory Visit (HOSPITAL_COMMUNITY): Payer: Self-pay

## 2023-09-27 ENCOUNTER — Other Ambulatory Visit (HOSPITAL_COMMUNITY): Payer: Self-pay

## 2023-09-27 ENCOUNTER — Other Ambulatory Visit: Payer: Self-pay

## 2023-09-27 MED ORDER — CHLORTHALIDONE 25 MG PO TABS
25.0000 mg | ORAL_TABLET | Freq: Every day | ORAL | 5 refills | Status: AC
  Filled 2023-09-27 – 2023-12-09 (×2): qty 90, 90d supply, fill #0
  Filled 2024-03-03: qty 90, 90d supply, fill #1
  Filled 2024-06-18: qty 90, 90d supply, fill #2

## 2023-09-28 ENCOUNTER — Other Ambulatory Visit: Payer: Self-pay

## 2023-09-28 ENCOUNTER — Encounter: Payer: Self-pay | Admitting: Pharmacist

## 2023-10-01 ENCOUNTER — Other Ambulatory Visit: Payer: Self-pay

## 2023-10-04 ENCOUNTER — Telehealth: Payer: Self-pay

## 2023-10-04 NOTE — Telephone Encounter (Signed)
 [facility] Patient Advocate Encounter  Patient's medications Cabenuva  have been couriered to [facility] from [facility] and will be administered at the patients appointment on 10/11/23.  Roylene Corn, CPhT Specialty Pharmacy Patient [facility] Phone: [phone] Fax:  [phone]

## 2023-10-06 NOTE — Progress Notes (Signed)
 The 10-year ASCVD risk score (Arnett DK, et al., 2019) is: 24.9%   Values used to calculate the score:     Age: 58 years     Sex: Male     Is Non-Hispanic African American: No     Diabetic: Yes     Tobacco smoker: No     Systolic Blood Pressure: 155 mmHg     Is BP treated: Yes     HDL Cholesterol: 51 mg/dL     Total Cholesterol: 239 mg/dL  Arlon Bergamo, BSN, RN

## 2023-10-08 NOTE — Progress Notes (Signed)
 HPI: [name] is a 58 y.o. male who presents to the [facility] pharmacy clinic for Cabenuva  administration.  Patient Active Problem List   Diagnosis Date Noted   Vaccine counseling 06/14/2022   Thrombocytosis 05/28/2021   DKA (diabetic ketoacidosis) (HCC) 03/05/2020   High anion gap metabolic acidosis 03/05/2020   Leukocytosis 03/05/2020   Dehydration 03/05/2020   Hyponatremia 03/05/2020   Hyperkalemia 03/05/2020   Hyperglycemia due to diabetes mellitus (HCC) 03/05/2020   Intractable nausea and vomiting 03/05/2020   DKA, type 2 (HCC) 03/05/2020   Other fatigue 03/14/2018   Shortness of breath on exertion 03/14/2018   Uncontrolled type 2 diabetes mellitus with hyperglycemia (HCC) 02/08/2018   Mixed hyperlipidemia 02/08/2018   AKI (acute kidney injury) (HCC) 03/23/2017   Special screening for malignant neoplasms, colon 11/18/2016   Family history of colon cancer 11/18/2016   Lung nodule, multiple 09/27/2014   Poorly controlled type 2 diabetes mellitus with circulatory disorder (HCC) 09/27/2014   Early syphilis, 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   BACLOFEN  (LIORESAL ) 10 MG TABLET    Take 1 tablet (10 mg total) by mouth at bedtime as needed.   CABOTEGRAVIR  & RILPIVIRINE  ER (CABENUVA ) 600 & 900 MG/3ML INJECTION    Inject 1 kit into the muscle every 2 (two) months.   CHLORTHALIDONE  (HYGROTON ) 25 MG TABLET    Take 1 tablet (25 mg total) by mouth daily.   CONTINUOUS GLUCOSE SENSOR  (FREESTYLE LIBRE 14 DAY SENSOR) MISC    Use as directed for continuous glucose monitoring. Change every 14 days   CONTINUOUS GLUCOSE SENSOR (FREESTYLE LIBRE 3 PLUS SENSOR) MISC    Use as directed   CYANOCOBALAMIN  (VITAMIN B12) 1000 MCG/ML INJECTION    Inject 1ml ( ) into the muscle once every 30 days   ERGOCALCIFEROL  (VITAMIN D2) 1.25 MG (50000 UT) CAPSULE    Take 1 capsule by mouth once weekly   ESCITALOPRAM  (LEXAPRO ) 10 MG TABLET    Take 1 tablet (10 mg total) by mouth daily.   ESCITALOPRAM  (LEXAPRO ) 20 MG TABLET    Take 1 tablet (20 mg total) by mouth daily.   EZETIMIBE  (ZETIA ) 10 MG TABLET    Take 1 tablet (10 mg total) by mouth daily.   INSULIN  DEGLUDEC (TRESIBA  FLEXTOUCH) 100 UNIT/ML FLEXTOUCH PEN    Inject 45 Units into the skin 2 (two) times daily.   INSULIN  GLARGINE (BASAGLAR  KWIKPEN) 100 UNIT/ML    Inject 45 Units into the skin 2 (two) times daily.   INSULIN  LISPRO (HUMALOG ) 100 UNIT/ML KWIKPEN    Inject 35 Units into the skin 4 (four) times daily.   INSULIN  PEN NEEDLE (TECHLITE PEN NEEDLES) 31G X 8 MM MISC    Use as directed   INSULIN  PEN NEEDLE (UNIFINE PENTIPS) 31G X 5 MM MISC    Use as directed   INSULIN  PEN NEEDLE 31G X 5 MM MISC    Use as directed   LISINOPRIL  (ZESTRIL ) 10  MG TABLET    Take 1 tablet (10 mg total) by mouth daily.   LORAZEPAM  (ATIVAN ) 0.5 MG TABLET    Take 1 tablet (0.5 mg total) by mouth 2 (two) times daily as needed.   MUPIROCIN  OINTMENT (BACTROBAN ) 2 %    Apply topically twice daily   PROPRANOLOL  (INDERAL ) 20 MG TABLET    Take 1 tablet (20 mg total) by mouth 3 (three) times daily as needed.   PROPRANOLOL  (INDERAL ) 20 MG TABLET    Take 1 tablet (20 mg total) by mouth 3 (three) times daily as needed.   ROSUVASTATIN  (CRESTOR ) 40 MG TABLET    Take 1 tablet by mouth daily.   SYRINGE-NEEDLE, DISP, 3 ML 25G X 1" 3 ML MISC    Use to inject Vitamin B12 into the muscle once a month   TIRZEPATIDE  (MOUNJARO ) 12.5 MG/0.5ML PEN    Inject 12.5 mg into the skin once a  week.   TIRZEPATIDE  (MOUNJARO ) 12.5 MG/0.5ML PEN    Inject 12.5 mg into the skin once a week.   TIRZEPATIDE  (MOUNJARO ) 15 MG/0.5ML PEN    Inject 15 mg into the skin once a week.   TIRZEPATIDE  (MOUNJARO ) 15 MG/0.5ML PEN    Inject 15 mg into the skin once a week.   ZOLPIDEM  (AMBIEN ) 10 MG TABLET    Take 1 tablet (10 mg total) by mouth at bedtime as needed for sleep. 30 day prescription.  Modified Medications   No medications on file  Discontinued Medications   No medications on file    Allergies: Allergies  Allergen Reactions   Famotidine Other (See Comments)    Contraindicated with ODEFSEY  Other reaction(s): Other (See Comments) Contraindicated with ODEFSEY    Prilosec [Omeprazole] Other (See Comments)    Contraindicated with RPV (lowers level of this ARV in ODEFSEY    Odefsey  [Emtricitabine -Rilpivirine -Tenofovir  Af]     Makes blood sugars run high    Labs: Lab Results  Component Value Date   HIV1RNAQUANT <20 DETECTED (A) 06/14/2023   HIV1RNAQUANT <20 DETECTED (A) 02/15/2023   HIV1RNAQUANT <20 (H) 10/15/2022   CD4TABS 938 06/14/2023   CD4TABS 1,068 10/15/2022   CD4TABS 904 04/13/2022    RPR and STI Lab Results  Component Value Date   LABRPR NON-REACTIVE 06/14/2023   LABRPR NON-REACTIVE 10/15/2022   LABRPR NON-REACTIVE 08/18/2021   LABRPR NON-REACTIVE 03/18/2020   LABRPR NON-REACTIVE 03/14/2019    STI Results GC CT  06/14/2023 10:51 AM Negative  Negative   10/15/2022  9:31 AM Negative  Negative   08/18/2021  9:21 AM Negative    Negative    Negative  Negative    Negative    Negative   02/22/2018 12:00 AM Negative  Negative   11/30/2016 12:00 AM Negative  Negative   10/28/2015 12:00 AM Negative  Negative   09/13/2014 12:00 AM Negative  Negative     Hepatitis B Lab Results  Component Value Date   HEPBSAB REACTIVE (A) 08/18/2021   HEPBSAG NON-REACTIVE 08/18/2021   Hepatitis C Lab Results  Component Value Date   HEPCAB NON-REACTIVE 10/15/2022   Hepatitis  A Lab Results  Component Value Date   HAV NON-REACTIVE 08/18/2021   Lipids: Lab Results  Component Value Date   CHOL 239 (H) 06/14/2023   TRIG 204 (H) 06/14/2023   HDL 51 06/14/2023   CHOLHDL 4.7 06/14/2023   VLDL 31 06/23/2022   LDLCALC 154 (H) 06/14/2023    TARGET DATE: The 25th  Assessment: Philippe presents today for his maintenance Cabenuva   injections. Past injections were tolerated well without issues. Last HIV RNA was <20 in January. Doing well with no issues today.  Administered cabotegravir  600mg /57mL in left upper outer quadrant of the gluteal muscle. Administered rilpivirine  900 mg/3mL in the right upper outer quadrant of the gluteal muscle. No issues with injections. He will follow up in 2 months for next set of injections.  Eligible for Shingrix  vaccine and he agreed to receive this today.  Plan: - Cabenuva  injections administered - Administered Shingrix  vaccine dose 1/2 today  - Next injections scheduled for 12/14/23 with Dr. Ernie Heal and 02/14/24 with Cassie - Call with any issues or questions  Georga Killings, PharmD PGY-1 Pharmacy Resident

## 2023-10-11 ENCOUNTER — Other Ambulatory Visit: Payer: Self-pay

## 2023-10-11 ENCOUNTER — Ambulatory Visit: Payer: Self-pay | Admitting: Pharmacist

## 2023-10-11 DIAGNOSIS — Z23 Encounter for immunization: Secondary | ICD-10-CM | POA: Diagnosis not present

## 2023-10-11 DIAGNOSIS — B20 Human immunodeficiency virus [HIV] disease: Secondary | ICD-10-CM

## 2023-10-11 DIAGNOSIS — Z113 Encounter for screening for infections with a predominantly sexual mode of transmission: Secondary | ICD-10-CM

## 2023-10-11 MED ORDER — CABOTEGRAVIR & RILPIVIRINE ER 600 & 900 MG/3ML IM SUER
1.0000 | Freq: Once | INTRAMUSCULAR | Status: AC
Start: 2023-10-11 — End: 2023-10-11
  Administered 2023-10-11: 1 via INTRAMUSCULAR

## 2023-10-13 ENCOUNTER — Other Ambulatory Visit (HOSPITAL_COMMUNITY): Payer: Self-pay

## 2023-10-13 ENCOUNTER — Other Ambulatory Visit: Payer: Self-pay

## 2023-10-19 ENCOUNTER — Other Ambulatory Visit (HOSPITAL_COMMUNITY): Payer: Self-pay

## 2023-10-19 ENCOUNTER — Other Ambulatory Visit: Payer: Self-pay

## 2023-10-26 DIAGNOSIS — Z6829 Body mass index (BMI) 29.0-29.9, adult: Secondary | ICD-10-CM | POA: Diagnosis not present

## 2023-10-26 DIAGNOSIS — E1169 Type 2 diabetes mellitus with other specified complication: Secondary | ICD-10-CM | POA: Diagnosis not present

## 2023-10-26 DIAGNOSIS — E663 Overweight: Secondary | ICD-10-CM | POA: Diagnosis not present

## 2023-10-26 DIAGNOSIS — Z636 Dependent relative needing care at home: Secondary | ICD-10-CM | POA: Diagnosis not present

## 2023-10-26 DIAGNOSIS — I10 Essential (primary) hypertension: Secondary | ICD-10-CM | POA: Diagnosis not present

## 2023-10-26 DIAGNOSIS — Z794 Long term (current) use of insulin: Secondary | ICD-10-CM | POA: Diagnosis not present

## 2023-10-26 DIAGNOSIS — Z8639 Personal history of other endocrine, nutritional and metabolic disease: Secondary | ICD-10-CM | POA: Diagnosis not present

## 2023-11-17 ENCOUNTER — Other Ambulatory Visit: Payer: Self-pay

## 2023-11-18 ENCOUNTER — Other Ambulatory Visit (HOSPITAL_COMMUNITY): Payer: Self-pay

## 2023-11-22 ENCOUNTER — Other Ambulatory Visit: Payer: Self-pay

## 2023-12-06 ENCOUNTER — Other Ambulatory Visit (HOSPITAL_COMMUNITY): Payer: Self-pay

## 2023-12-06 ENCOUNTER — Other Ambulatory Visit: Payer: Self-pay

## 2023-12-06 NOTE — Progress Notes (Signed)
 Specialty Pharmacy Refill Coordination Note  JARRIS KORTZ is a 58 y.o. male assessed today regarding refills of clinic administered specialty medication(s) Cabotegravir  & Rilpivirine  (CABENUVA )   Clinic requested Courier to Provider Office   Delivery date: 12/09/23   Verified address: [address] [place] [address]   Medication will be filled on 12/08/23.

## 2023-12-08 ENCOUNTER — Other Ambulatory Visit: Payer: Self-pay

## 2023-12-09 ENCOUNTER — Other Ambulatory Visit (HOSPITAL_COMMUNITY): Payer: Self-pay

## 2023-12-09 ENCOUNTER — Other Ambulatory Visit: Payer: Self-pay | Admitting: Cardiology

## 2023-12-09 ENCOUNTER — Telehealth: Payer: Self-pay

## 2023-12-09 ENCOUNTER — Other Ambulatory Visit: Payer: Self-pay

## 2023-12-09 MED FILL — Lisinopril Tab 10 MG: ORAL | 30 days supply | Qty: 30 | Fill #0 | Status: AC

## 2023-12-09 MED FILL — Rosuvastatin Calcium Tab 40 MG: ORAL | 30 days supply | Qty: 30 | Fill #0 | Status: AC

## 2023-12-09 NOTE — Telephone Encounter (Signed)
 [facility] Patient Advocate Encounter  Patient's medications CABENUVA  have been couriered to [facility] from [facility] and will be administered at the patients appointment on 12/14/23.  Charmaine Sharps, CPhT Specialty Pharmacy Patient [facility] Phone: [phone] Fax:  [phone]

## 2023-12-14 ENCOUNTER — Encounter: Payer: Self-pay | Admitting: Infectious Disease

## 2023-12-14 ENCOUNTER — Other Ambulatory Visit: Payer: Self-pay

## 2023-12-14 ENCOUNTER — Other Ambulatory Visit (HOSPITAL_COMMUNITY)
Admission: RE | Admit: 2023-12-14 | Discharge: 2023-12-14 | Disposition: A | Discharge status: Home / Self Care | Source: Ambulatory Visit | Attending: Infectious Disease | Admitting: Infectious Disease

## 2023-12-14 ENCOUNTER — Ambulatory Visit (INDEPENDENT_AMBULATORY_CARE_PROVIDER_SITE_OTHER): Admitting: Infectious Disease

## 2023-12-14 VITALS — BP 151/71 | HR 113 | Temp 97.5°F | Wt 189.0 lb

## 2023-12-14 DIAGNOSIS — Z7185 Encounter for immunization safety counseling: Secondary | ICD-10-CM | POA: Insufficient documentation

## 2023-12-14 DIAGNOSIS — I2583 Coronary atherosclerosis due to lipid rich plaque: Secondary | ICD-10-CM | POA: Insufficient documentation

## 2023-12-14 DIAGNOSIS — R5383 Other fatigue: Secondary | ICD-10-CM | POA: Insufficient documentation

## 2023-12-14 DIAGNOSIS — F32A Depression, unspecified: Secondary | ICD-10-CM

## 2023-12-14 DIAGNOSIS — E785 Hyperlipidemia, unspecified: Secondary | ICD-10-CM

## 2023-12-14 DIAGNOSIS — I10 Essential (primary) hypertension: Secondary | ICD-10-CM | POA: Diagnosis not present

## 2023-12-14 DIAGNOSIS — I2510 Atherosclerotic heart disease of native coronary artery without angina pectoris: Secondary | ICD-10-CM

## 2023-12-14 DIAGNOSIS — B20 Human immunodeficiency virus [HIV] disease: Secondary | ICD-10-CM

## 2023-12-14 MED ORDER — CABOTEGRAVIR & RILPIVIRINE ER 600 & 900 MG/3ML IM SUER
1.0000 | Freq: Once | INTRAMUSCULAR | Status: AC
  Administered 2023-12-14: 1 via INTRAMUSCULAR

## 2023-12-14 MED ORDER — ZOSTER VAC RECOMB ADJUVANTED 50 MCG/0.5ML IM SUSR
0.5000 mL | Freq: Once | INTRAMUSCULAR | 0 refills | Status: AC
  Filled 2023-12-14: qty 0.5, 1d supply, fill #0

## 2023-12-14 NOTE — Progress Notes (Signed)
 Subjective:  Chief complaint: follow-up for HIV disease on medications   Patient ID: [name], male    DOB: Sep 15, 1965, 58 y.o.   MRN: [id number]  HPI  Discussed the use of AI scribe software for clinical note transcription with the patient, who gave verbal consent to proceed.  History of Present Illness   [name] is a 58 year old male who presents with stress and emotional strain related to caregiving for his mother with vascular dementia.  His mother recently began experiencing seizures, with the first occurring two weeks ago, and had another minor seizure at the hospital. She is now on medication, and an EEG was performed with results pending. He has taken steps to ensure her safety, including her no longer driving.  He has not been sexually active for about a year and declined additional STI testing beyond routine urine checks. He received his first shingles vaccine at his last visit.   His HIV is managed with Q2M Cabenuva .      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   Syphilitic hepatitis    Type 2 diabetes mellitus (HCC)    Vaccine counseling 06/14/2022    Past Surgical History:  Procedure Laterality Date   BIOPSY  12/28/2019   Procedure: BIOPSY;  Surgeon: Golda Claudis PENNER, MD;  [facility];  Service: Endoscopy;;   CHOLECYSTECTOMY OPEN  07/2001   COLONOSCOPY N/A 05/28/2017   Procedure: COLONOSCOPY;  Surgeon: Golda Claudis PENNER, MD;  [facility]: [facility];  Service: Endoscopy;  Laterality: N/A;   200 - pt to prep in Endo   CORONARY ANGIOPLASTY WITH STENT PLACEMENT  03/2006; 10/2007;    ESOPHAGEAL DILATION N/A 12/28/2019   Procedure: ESOPHAGEAL DILATION;  Surgeon: Golda Claudis PENNER, MD;  [facility];  Service: Endoscopy;  Laterality: N/A;   ESOPHAGOGASTRODUODENOSCOPY N/A 12/28/2019   Procedure: ESOPHAGOGASTRODUODENOSCOPY (EGD);  Surgeon: Golda Claudis PENNER, MD;  [facility]: [facility];  Service: Endoscopy;  Laterality: N/A;  125, per Jenkins moved to 10:55 pt aware   HERNIA REPAIR     LAPAROSCOPIC INCISIONAL / UMBILICAL / VENTRAL HERNIA REPAIR  07/2001   UHR   POLYPECTOMY  05/28/2017   Procedure: POLYPECTOMY;  Surgeon: Golda Claudis PENNER, MD;  [facility]: [facility];  Service: Endoscopy;;  colon    Family History  Problem Relation Age of Onset   Heart attack Father        7 heart attacks   CAD Father    Alcoholism Father    Hyperlipidemia Mother    Hypertension Mother    Anemia Other    Diabetes Other    CAD Sister    Diabetes Sister       Social History   Socioeconomic History   Marital status: Single    Spouse name: Not on file   Number of children: 0   Years of education: Not on file   Highest education level: Not on file  Occupational  History   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  Tobacco Use   Smoking status: Never   Smokeless tobacco: Never  Vaping Use   Vaping status: Never Used  Substance and Sexual Activity   Alcohol  use: Not Currently    Alcohol /week: 0.0 - 1.0 standard drinks of alcohol     Comment: Social drinker, vodka, once a month   Drug use: No   Sexual activity: Not Currently    Partners: Male    Comment: declined condoms  Other Topics Concern   Not on file  Social History Narrative   Not on file   Social Drivers of Health   Financial Resource Strain: Not on file  Food Insecurity: Not on file  Transportation Needs: Not on file  Physical Activity: Not on file  Stress: Not on file  Social Connections:  Not on file    Allergies  Allergen Reactions   Famotidine Other (See Comments)    Contraindicated with ODEFSEY  Other reaction(s): Other (See Comments) Contraindicated with ODEFSEY    Prilosec [Omeprazole] Other (See Comments)    Contraindicated with RPV (lowers level of this ARV in ODEFSEY    Odefsey  [Emtricitabine -Rilpivirine -Tenofovir  Af]     Makes blood sugars run high     Current Outpatient Medications:    aspirin  81 MG tablet, Take 1 tablet (81 mg total) by mouth daily with breakfast., Disp: 30 tablet, Rfl: 3   baclofen  (LIORESAL ) 10 MG tablet, Take 1 tablet (10 mg total) by mouth at bedtime as needed., Disp: 30 tablet, Rfl: 0   cabotegravir  & rilpivirine  ER (CABENUVA ) 600 & 900 MG/3ML injection, Inject 1 kit into the muscle every 2 (two) months., Disp: 6 mL, Rfl: 5   chlorthalidone  (HYGROTON ) 25 MG tablet, Take 1 tablet (25 mg total) by mouth daily., Disp: 90 tablet, Rfl: 5   Continuous Glucose Sensor (FREESTYLE LIBRE 14 DAY SENSOR) MISC, Use as directed for continuous glucose monitoring. Change every 14 days, Disp: 2 each, Rfl: 12   Continuous Glucose Sensor (FREESTYLE LIBRE 3 PLUS SENSOR) MISC, Use as directed, Disp: 6 each, Rfl: 4   cyanocobalamin  (VITAMIN B12) 1000 MCG/ML injection, Inject 1ml ( ) into the muscle once every 30 days, Disp: 1 mL, Rfl: 2   ergocalciferol  (VITAMIN D2) 1.25 MG (50000 UT) capsule, Take 1 capsule by mouth once weekly, Disp: 4 capsule, Rfl: 0   escitalopram  (LEXAPRO ) 10 MG tablet, Take 1 tablet (10 mg total) by mouth daily., Disp: 90 tablet, Rfl: 3   escitalopram  (LEXAPRO ) 20 MG tablet, Take 1 tablet (20 mg total) by mouth daily., Disp: 30 tablet, Rfl: 3   ezetimibe  (ZETIA ) 10 MG tablet, Take 1 tablet (10 mg total) by mouth daily., Disp: 90 tablet, Rfl: 3   insulin  degludec (TRESIBA  FLEXTOUCH) 100 UNIT/ML FlexTouch Pen, Inject 45 Units into the skin 2 (two) times daily., Disp: 30 mL, Rfl: 12   Insulin  Glargine (BASAGLAR  KWIKPEN) 100 UNIT/ML, Inject  45 Units into the skin 2 (two) times daily., Disp: , Rfl:    insulin  lispro (HUMALOG ) 100 UNIT/ML KwikPen, Inject 35 Units into the skin 4 (four) times daily., Disp: 90 mL, Rfl: 4   Insulin  Pen Needle (TECHLITE PEN NEEDLES) 31G X 8 MM MISC, Use as directed, Disp: 15000 each, Rfl: 12   Insulin  Pen Needle (UNIFINE PENTIPS) 31G X 5 MM MISC, Use as directed, Disp: 150 each, Rfl: 4   Insulin  Pen Needle 31G X 5 MM MISC, Use as directed, Disp: 150 each, Rfl: 4   lisinopril  (ZESTRIL )  10 MG tablet, Take 1 tablet (10 mg total) by mouth daily. PLEASE MAKE AN APPOINTMENT IN ORDER TO RECEIVE ADDITIONAL REFILLS, FIRST ATTEMPT, Disp: 30 tablet, Rfl: 0   LORazepam  (ATIVAN ) 0.5 MG tablet, Take 1 tablet (0.5 mg total) by mouth 2 (two) times daily as needed., Disp: 30 tablet, Rfl: 3   mupirocin  ointment (BACTROBAN ) 2 %, Apply topically twice daily, Disp: 22 g, Rfl: 2   propranolol  (INDERAL ) 20 MG tablet, Take 1 tablet (20 mg total) by mouth 3 (three) times daily as needed., Disp: 90 tablet, Rfl: 3   propranolol  (INDERAL ) 20 MG tablet, Take 1 tablet (20 mg total) by mouth 3 (three) times daily as needed., Disp: 90 tablet, Rfl: 0   rosuvastatin  (CRESTOR ) 40 MG tablet, Take 1 tablet (40 mg total) by mouth daily. PT. MUST MAKE AN APPOINTMENT IN ORDER TO RECEIVE ADDITIONAL REFILLS, FIRST ATTEMPT., Disp: 30 tablet, Rfl: 0   SYRINGE-NEEDLE, DISP, 3 ML 25G X 1 3 ML MISC, Use to inject Vitamin B12 into the muscle once a month, Disp: 10 each, Rfl: 0   tirzepatide  (MOUNJARO ) 12.5 MG/0.5ML Pen, Inject 12.5 mg into the skin once a week., Disp: 2 mL, Rfl: 1   tirzepatide  (MOUNJARO ) 12.5 MG/0.5ML Pen, Inject 12.5 mg into the skin once a week., Disp: 6 mL, Rfl: 1   tirzepatide  (MOUNJARO ) 15 MG/0.5ML Pen, Inject 15 mg into the skin once a week., Disp: 2 mL, Rfl: 0   tirzepatide  (MOUNJARO ) 15 MG/0.5ML Pen, Inject 15 mg into the skin once a week., Disp: 2 mL, Rfl: 3   zolpidem  (AMBIEN ) 10 MG tablet, Take 1 tablet (10 mg total) by mouth  at bedtime as needed for sleep. 30 day prescription., Disp: 15 tablet, Rfl: 0    Review of Systems  Constitutional:  Negative for activity change, appetite change, chills, diaphoresis, fatigue, fever and unexpected weight change.  HENT:  Negative for congestion, rhinorrhea, sinus pressure, sneezing, sore throat and trouble swallowing.   Eyes:  Negative for photophobia and visual disturbance.  Respiratory:  Negative for cough, chest tightness, shortness of breath, wheezing and stridor.   Cardiovascular:  Negative for chest pain, palpitations and leg swelling.  Gastrointestinal:  Negative for abdominal distention, abdominal pain, anal bleeding, blood in stool, constipation, diarrhea, nausea and vomiting.  Genitourinary:  Negative for difficulty urinating, dysuria, flank pain and hematuria.  Musculoskeletal:  Negative for arthralgias, back pain, gait problem, joint swelling and myalgias.  Skin:  Negative for color change, pallor, rash and wound.  Neurological:  Negative for dizziness, tremors, weakness and light-headedness.  Hematological:  Negative for adenopathy. Does not bruise/bleed easily.  Psychiatric/Behavioral:  Negative for agitation, behavioral problems, confusion, decreased concentration, dysphoric mood and sleep disturbance.        Objective:   Physical Exam Constitutional:      Appearance: He is well-developed.  HENT:     Head: Normocephalic and atraumatic.  Eyes:     Conjunctiva/sclera: Conjunctivae normal.  Cardiovascular:     Rate and Rhythm: Normal rate and regular rhythm.  Pulmonary:     Effort: Pulmonary effort is normal. No respiratory distress.     Breath sounds: No wheezing.  Abdominal:     General: There is no distension.     Palpations: Abdomen is soft.  Musculoskeletal:        General: No tenderness. Normal range of motion.     Cervical back: Normal range of motion and neck supple.  Skin:    General: Skin is warm and  dry.     Coloration: Skin is not  pale.     Findings: No erythema or rash.  Neurological:     General: No focal deficit present.     Mental Status: He is alert and oriented to person, place, and time.  Psychiatric:        Mood and Affect: Mood is depressed.        Behavior: Behavior normal.        Thought Content: Thought content normal.        Judgment: Judgment normal.           Assessment & Plan:   HIV disease:  I will add order HIV viral load CD4 count CBC with differential CMP, RPR GC and chlamydia and I will continue  Bladimir P Kruger's Q2M Cabenuva  injections  IF:rnuwpwlz lantus , Mounjaro   DME,: Will continue Crestor   Will check lipid panel  Coronary artery disease will continue metoprolol  Crestor  is also on Zetia  I believe.   STI screening with scant screen for syphilis and serum and gonorrhea chlamydia and urine he has not been sexually active for a year and did not feel that it was necessary to perform other site-specific STI testing such as oral rectal GC chlamydia testing.    Vaccine counseling recommend to get second shingles vaccine though he will need to go to the pharmacy for this.

## 2023-12-15 LAB — URINE CYTOLOGY ANCILLARY ONLY
Chlamydia: NEGATIVE
Comment: NEGATIVE
Comment: NORMAL
Neisseria Gonorrhea: NEGATIVE

## 2023-12-16 LAB — COMPLETE METABOLIC PANEL WITHOUT GFR
AG Ratio: 1.5 (calc) (ref 1.0–2.5)
ALT: 63 U/L — ABNORMAL HIGH (ref 9–46)
AST: 31 U/L (ref 10–35)
Albumin: 4 g/dL (ref 3.6–5.1)
Alkaline phosphatase (APISO): 108 U/L (ref 35–144)
BUN: 25 mg/dL (ref 7–25)
CO2: 22 mmol/L (ref 20–32)
Calcium: 9.7 mg/dL (ref 8.6–10.3)
Chloride: 104 mmol/L (ref 98–110)
Creat: 0.87 mg/dL (ref 0.70–1.30)
Globulin: 2.7 g/dL (ref 1.9–3.7)
Glucose, Bld: 198 mg/dL — ABNORMAL HIGH (ref 65–99)
Potassium: 4 mmol/L (ref 3.5–5.3)
Sodium: 139 mmol/L (ref 135–146)
Total Bilirubin: 0.5 mg/dL (ref 0.2–1.2)
Total Protein: 6.7 g/dL (ref 6.1–8.1)

## 2023-12-16 LAB — CBC WITH DIFFERENTIAL/PLATELET
Absolute Lymphocytes: 2228 {cells}/uL (ref 850–3900)
Absolute Monocytes: 537 {cells}/uL (ref 200–950)
Basophils Absolute: 47 {cells}/uL (ref 0–200)
Basophils Relative: 0.6 %
Eosinophils Absolute: 103 {cells}/uL (ref 15–500)
Eosinophils Relative: 1.3 %
HCT: 45.6 % (ref 38.5–50.0)
Hemoglobin: 14.6 g/dL (ref 13.2–17.1)
MCH: 28.6 pg (ref 27.0–33.0)
MCHC: 32 g/dL (ref 32.0–36.0)
MCV: 89.4 fL (ref 80.0–100.0)
MPV: 9 fL (ref 7.5–12.5)
Monocytes Relative: 6.8 %
Neutro Abs: 4985 {cells}/uL (ref 1500–7800)
Neutrophils Relative %: 63.1 %
Platelets: 319 Thousand/uL (ref 140–400)
RBC: 5.1 Million/uL (ref 4.20–5.80)
RDW: 13.1 % (ref 11.0–15.0)
Total Lymphocyte: 28.2 %
WBC: 7.9 Thousand/uL (ref 3.8–10.8)

## 2023-12-16 LAB — T-HELPER CELLS (CD4) COUNT (NOT AT ARMC)
CD4 % Helper T Cell: 47 % (ref 33–65)
CD4 T Cell Abs: 1041 /uL (ref 400–1790)

## 2023-12-16 LAB — LIPID PANEL
Cholesterol: 249 mg/dL — ABNORMAL HIGH (ref <200)
HDL: 60 mg/dL (ref >=40)
LDL Cholesterol (Calc): 155 mg/dL — ABNORMAL HIGH
Non-HDL Cholesterol (Calc): 189 mg/dL — ABNORMAL HIGH (ref <130)
Total CHOL/HDL Ratio: 4.2 (calc) (ref <5.0)
Triglycerides: 206 mg/dL — ABNORMAL HIGH (ref <150)

## 2023-12-16 LAB — RPR: RPR Ser Ql: NONREACTIVE

## 2023-12-16 LAB — HIV-1 RNA QUANT-NO REFLEX-BLD
HIV 1 RNA Quant: NOT DETECTED {copies}/mL
HIV-1 RNA Quant, Log: NOT DETECTED {Log_copies}/mL

## 2024-01-03 ENCOUNTER — Other Ambulatory Visit: Payer: Self-pay | Admitting: Cardiology

## 2024-01-03 ENCOUNTER — Other Ambulatory Visit (HOSPITAL_COMMUNITY): Payer: Self-pay

## 2024-01-03 ENCOUNTER — Other Ambulatory Visit: Payer: Self-pay

## 2024-01-03 ENCOUNTER — Other Ambulatory Visit (HOSPITAL_BASED_OUTPATIENT_CLINIC_OR_DEPARTMENT_OTHER): Payer: Self-pay

## 2024-01-04 ENCOUNTER — Other Ambulatory Visit (HOSPITAL_COMMUNITY): Payer: Self-pay

## 2024-01-04 ENCOUNTER — Other Ambulatory Visit: Payer: Self-pay

## 2024-01-04 MED ORDER — MOUNJARO 15 MG/0.5ML ~~LOC~~ SOAJ
15.0000 mg | SUBCUTANEOUS | 3 refills | Status: DC
  Filled 2024-01-04: qty 2, 28d supply, fill #0
  Filled 2024-01-29: qty 2, 28d supply, fill #1
  Filled 2024-03-01: qty 2, 28d supply, fill #2
  Filled 2024-03-24: qty 2, 28d supply, fill #3

## 2024-01-05 ENCOUNTER — Other Ambulatory Visit (HOSPITAL_COMMUNITY): Payer: Self-pay

## 2024-01-05 ENCOUNTER — Other Ambulatory Visit: Payer: Self-pay

## 2024-01-05 MED FILL — Rosuvastatin Calcium Tab 40 MG: ORAL | 15 days supply | Qty: 15 | Fill #0 | Status: AC

## 2024-01-05 MED FILL — Lisinopril Tab 10 MG: ORAL | 15 days supply | Qty: 15 | Fill #0 | Status: AC

## 2024-01-07 ENCOUNTER — Other Ambulatory Visit (HOSPITAL_COMMUNITY): Payer: Self-pay

## 2024-01-07 ENCOUNTER — Other Ambulatory Visit: Payer: Self-pay

## 2024-01-18 ENCOUNTER — Other Ambulatory Visit (HOSPITAL_COMMUNITY): Payer: Self-pay

## 2024-01-29 ENCOUNTER — Other Ambulatory Visit: Payer: Self-pay

## 2024-01-29 ENCOUNTER — Other Ambulatory Visit: Payer: Self-pay | Admitting: Cardiology

## 2024-01-31 ENCOUNTER — Other Ambulatory Visit (HOSPITAL_COMMUNITY): Payer: Self-pay

## 2024-01-31 ENCOUNTER — Other Ambulatory Visit: Payer: Self-pay

## 2024-02-01 ENCOUNTER — Other Ambulatory Visit (HOSPITAL_COMMUNITY): Payer: Self-pay

## 2024-02-01 ENCOUNTER — Other Ambulatory Visit: Payer: Self-pay

## 2024-02-01 MED FILL — Rosuvastatin Calcium Tab 40 MG: ORAL | 7 days supply | Qty: 7 | Fill #0 | Status: AC

## 2024-02-01 MED FILL — Lisinopril Tab 10 MG: ORAL | 7 days supply | Qty: 7 | Fill #0 | Status: AC

## 2024-02-02 ENCOUNTER — Other Ambulatory Visit (HOSPITAL_COMMUNITY): Payer: Self-pay

## 2024-02-03 ENCOUNTER — Other Ambulatory Visit: Payer: Self-pay

## 2024-02-03 ENCOUNTER — Other Ambulatory Visit: Payer: Self-pay | Admitting: Cardiology

## 2024-02-03 ENCOUNTER — Other Ambulatory Visit (HOSPITAL_COMMUNITY): Payer: Self-pay

## 2024-02-03 MED ORDER — INSULIN LISPRO (1 UNIT DIAL) 100 UNIT/ML (KWIKPEN)
35.0000 [IU] | PEN_INJECTOR | Freq: Four times a day (QID) | SUBCUTANEOUS | 4 refills | Status: AC
  Filled 2024-02-03: qty 90, 65d supply, fill #0
  Filled 2024-04-10 – 2024-05-03 (×2): qty 90, 65d supply, fill #1

## 2024-02-04 ENCOUNTER — Other Ambulatory Visit (HOSPITAL_COMMUNITY): Payer: Self-pay

## 2024-02-04 ENCOUNTER — Other Ambulatory Visit: Payer: Self-pay

## 2024-02-07 ENCOUNTER — Other Ambulatory Visit: Payer: Self-pay

## 2024-02-07 ENCOUNTER — Other Ambulatory Visit (HOSPITAL_COMMUNITY): Payer: Self-pay

## 2024-02-07 NOTE — Progress Notes (Signed)
 Specialty Pharmacy Refill Coordination Note  [name] is a 58 y.o. male assessed today regarding refills of clinic administered specialty medication(s) Cabotegravir  & Rilpivirine  (CABENUVA )   Clinic requested Courier to Provider Office   Delivery date: 02/10/24   Verified address: [address] [place] [address]   Medication will be filled on 02/09/24.

## 2024-02-08 ENCOUNTER — Telehealth: Payer: Self-pay | Admitting: Cardiology

## 2024-02-08 ENCOUNTER — Other Ambulatory Visit: Payer: Self-pay

## 2024-02-08 ENCOUNTER — Other Ambulatory Visit: Payer: Self-pay | Admitting: Cardiology

## 2024-02-08 NOTE — Telephone Encounter (Signed)
 Pt called 3xs and letter mailed. No response from pt. Deleting recall.

## 2024-02-09 ENCOUNTER — Other Ambulatory Visit (HOSPITAL_COMMUNITY): Payer: Self-pay

## 2024-02-09 ENCOUNTER — Other Ambulatory Visit: Payer: Self-pay

## 2024-02-10 ENCOUNTER — Telehealth: Payer: Self-pay

## 2024-02-10 NOTE — Telephone Encounter (Signed)
 [facility] Patient Advocate Encounter  Patient's medications CABENUVA  have been couriered to [facility] from [facility] and will be administered at the patients appointment on 02/14/24.  Charmaine Sharps, CPhT Specialty Pharmacy Patient [facility] Phone: [phone] Fax:  [phone]

## 2024-02-11 ENCOUNTER — Other Ambulatory Visit (HOSPITAL_COMMUNITY): Payer: Self-pay

## 2024-02-13 NOTE — Progress Notes (Unsigned)
 HPI: [name] is a 58 y.o. male who presents to the [facility] pharmacy clinic for Cabenuva  administration.  Patient Active Problem List   Diagnosis Date Noted   Vaccine counseling 06/14/2022   Thrombocytosis 05/28/2021   DKA (diabetic ketoacidosis) (HCC) 03/05/2020   High anion gap metabolic acidosis 03/05/2020   Leukocytosis 03/05/2020   Dehydration 03/05/2020   Hyponatremia 03/05/2020   Hyperkalemia 03/05/2020   Hyperglycemia due to diabetes mellitus (HCC) 03/05/2020   Intractable nausea and vomiting 03/05/2020   DKA, type 2 (HCC) 03/05/2020   Other fatigue 03/14/2018   Shortness of breath on exertion 03/14/2018   Uncontrolled type 2 diabetes mellitus with hyperglycemia (HCC) 02/08/2018   Mixed hyperlipidemia 02/08/2018   AKI (acute kidney injury) (HCC) 03/23/2017   Special screening for malignant neoplasms, colon 11/18/2016   Family history of colon cancer 11/18/2016   Lung nodule, multiple 09/27/2014   Poorly controlled type 2 diabetes mellitus with circulatory disorder (HCC) 09/27/2014   Early syphilis, 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   BACLOFEN  (LIORESAL ) 10 MG TABLET    Take 1 tablet (10 mg total) by mouth at bedtime as needed.   CABOTEGRAVIR  & RILPIVIRINE  ER (CABENUVA ) 600 & 900 MG/3ML INJECTION    Inject 1 kit into the muscle every 2 (two) months.   CHLORTHALIDONE  (HYGROTON ) 25 MG TABLET    Take 1 tablet (25 mg total) by mouth daily.   CONTINUOUS GLUCOSE SENSOR  (FREESTYLE LIBRE 14 DAY SENSOR) MISC    Use as directed for continuous glucose monitoring. Change every 14 days   CONTINUOUS GLUCOSE SENSOR (FREESTYLE LIBRE 3 PLUS SENSOR) MISC    Use as directed   CYANOCOBALAMIN  (VITAMIN B12) 1000 MCG/ML INJECTION    Inject 1ml ( ) into the muscle once every 30 days   ERGOCALCIFEROL  (VITAMIN D2) 1.25 MG (50000 UT) CAPSULE    Take 1 capsule by mouth once weekly   ESCITALOPRAM  (LEXAPRO ) 10 MG TABLET    Take 1 tablet (10 mg total) by mouth daily.   ESCITALOPRAM  (LEXAPRO ) 20 MG TABLET    Take 1 tablet (20 mg total) by mouth daily.   EZETIMIBE  (ZETIA ) 10 MG TABLET    Take 1 tablet (10 mg total) by mouth daily.   INSULIN  DEGLUDEC (TRESIBA  FLEXTOUCH) 100 UNIT/ML FLEXTOUCH PEN    Inject 45 Units into the skin 2 (two) times daily.   INSULIN  GLARGINE (BASAGLAR  KWIKPEN) 100 UNIT/ML    Inject 45 Units into the skin 2 (two) times daily.   INSULIN  LISPRO (HUMALOG ) 100 UNIT/ML KWIKPEN    Inject 35 Units into the skin 4 (four) times daily.   INSULIN  PEN NEEDLE (TECHLITE PEN NEEDLES) 31G X 8 MM MISC    Use as directed   INSULIN  PEN NEEDLE (UNIFINE PENTIPS) 31G X 5 MM MISC    Use as directed   INSULIN  PEN NEEDLE 31G X 5 MM MISC    Use as directed   LISINOPRIL  (ZESTRIL ) 10  MG TABLET    Take 1 tablet (10 mg total) by mouth daily. PLEASE CALL OFFICE AND SCHEDULE  OVERDUE APPOINTMENT WITH BRITTANY STRADER FOR FUTHER REFILLS [phone]- 2nd ATTEMPT   LORAZEPAM  (ATIVAN ) 0.5 MG TABLET    Take 1 tablet (0.5 mg total) by mouth 2 (two) times daily as needed.   MUPIROCIN  OINTMENT (BACTROBAN ) 2 %    Apply topically twice daily   PROPRANOLOL  (INDERAL ) 20 MG TABLET    Take 1 tablet (20 mg total) by mouth 3 (three) times daily as needed.   PROPRANOLOL  (INDERAL ) 20 MG TABLET    Take 1 tablet (20 mg total) by mouth 3 (three) times daily as needed.   ROSUVASTATIN  (CRESTOR ) 40 MG TABLET    Take 1 tablet (40 mg total) by mouth daily.PLEASE CALL OFFICE AND SCHEDULE  OVERDUE APPOINTMENT WITH  BRITTANY STRADER FOR FUTHER REFILLS [phone]- 2nd ATTEMPT   SYRINGE-NEEDLE, DISP, 3 ML 25G X 1 3 ML MISC    Use to inject Vitamin B12 into the muscle once a month   TIRZEPATIDE  (MOUNJARO ) 12.5 MG/0.5ML PEN    Inject 12.5 mg into the skin once a week.   TIRZEPATIDE  (MOUNJARO ) 12.5 MG/0.5ML PEN    Inject 12.5 mg into the skin once a week.   TIRZEPATIDE  (MOUNJARO ) 15 MG/0.5ML PEN    Inject 15 mg into the skin once a week.   TIRZEPATIDE  (MOUNJARO ) 15 MG/0.5ML PEN    Inject 15 mg into the skin once a week.   ZOLPIDEM  (AMBIEN ) 10 MG TABLET    Take 1 tablet (10 mg total) by mouth at bedtime as needed for sleep. 30 day prescription.  Modified Medications   No medications on file  Discontinued Medications   No medications on file    Allergies: Allergies  Allergen Reactions   Famotidine Other (See Comments)    Contraindicated with ODEFSEY  Other reaction(s): Other (See Comments) Contraindicated with ODEFSEY    Prilosec [Omeprazole] Other (See Comments)    Contraindicated with RPV (lowers level of this ARV in ODEFSEY    Odefsey  [Emtricitabine -Rilpivirine -Tenofovir  Af]     Makes blood sugars run high    Labs: Lab Results  Component Value Date   HIV1RNAQUANT NOT DETECTED 12/14/2023   HIV1RNAQUANT <20 DETECTED (A) 06/14/2023   HIV1RNAQUANT <20 DETECTED (A) 02/15/2023   CD4TABS 1,041 12/14/2023   CD4TABS 938 06/14/2023   CD4TABS 1,068 10/15/2022    RPR and STI Lab Results  Component Value Date   LABRPR NON-REACTIVE 12/14/2023   LABRPR NON-REACTIVE 06/14/2023   LABRPR NON-REACTIVE 10/15/2022   LABRPR NON-REACTIVE 08/18/2021   LABRPR NON-REACTIVE 03/18/2020    STI Results GC CT  12/14/2023  9:37 AM Negative  Negative   06/14/2023 10:51 AM Negative  Negative   10/15/2022  9:31 AM Negative  Negative   08/18/2021  9:21 AM Negative    Negative    Negative  Negative    Negative    Negative   02/22/2018 12:00 AM Negative  Negative   11/30/2016 12:00 AM Negative  Negative    10/28/2015 12:00 AM Negative  Negative   09/13/2014 12:00 AM Negative  Negative     Hepatitis B Lab Results  Component Value Date   HEPBSAB REACTIVE (A) 08/18/2021   HEPBSAG NON-REACTIVE 08/18/2021   Hepatitis C Lab Results  Component Value Date   HEPCAB NON-REACTIVE 10/15/2022   Hepatitis A Lab Results  Component Value Date   HAV NON-REACTIVE 08/18/2021   Lipids: Lab Results  Component Value Date   CHOL 249 (  H) 12/14/2023   TRIG 206 (H) 12/14/2023   HDL 60 12/14/2023   CHOLHDL 4.2 12/14/2023   VLDL 31 06/23/2022   LDLCALC 155 (H) 12/14/2023    TARGET DATE: The 25th  Assessment: Matty presents today for his maintenance Cabenuva  injections. Past injections were tolerated well without issues. Last HIV RNA was undetectable in July. Will defer HIV RNA today. Doing well with no issues today.  Administered cabotegravir  600mg /46mL in left upper outer quadrant of the gluteal muscle. Administered rilpivirine  900 mg/3mL in the right upper outer quadrant of the gluteal muscle. No issues with injections. He will follow up in 2 months for next set of injections.  Discussed eligibility for the 2025-2026 flu vaccine. He politely declines today because he got it through [facility] a week or two ago. He is due for a hepatitis A antibody, but since he did not get blood work today will defer to next appointment.   Plan: - Cabenuva  injections administered - Next injections scheduled for 04/24/24 with Cassie and 06/20/24 with Dr Fleeta Rothman - Call with any issues or questions  Izetta Carl, PharmD PGY1 Pharmacy Resident

## 2024-02-14 ENCOUNTER — Ambulatory Visit (INDEPENDENT_AMBULATORY_CARE_PROVIDER_SITE_OTHER): Payer: Self-pay | Admitting: Pharmacist

## 2024-02-14 ENCOUNTER — Other Ambulatory Visit: Payer: Self-pay

## 2024-02-14 DIAGNOSIS — Z23 Encounter for immunization: Secondary | ICD-10-CM | POA: Diagnosis not present

## 2024-02-14 DIAGNOSIS — B20 Human immunodeficiency virus [HIV] disease: Secondary | ICD-10-CM | POA: Diagnosis not present

## 2024-02-14 MED ORDER — CABOTEGRAVIR & RILPIVIRINE ER 600 & 900 MG/3ML IM SUER
1.0000 | Freq: Once | INTRAMUSCULAR | Status: AC
  Administered 2024-02-14: 1 via INTRAMUSCULAR

## 2024-02-15 ENCOUNTER — Other Ambulatory Visit (HOSPITAL_COMMUNITY): Payer: Self-pay

## 2024-02-17 ENCOUNTER — Other Ambulatory Visit (HOSPITAL_COMMUNITY): Payer: Self-pay

## 2024-02-17 ENCOUNTER — Other Ambulatory Visit: Payer: Self-pay

## 2024-02-24 ENCOUNTER — Encounter (HOSPITAL_COMMUNITY): Payer: Self-pay

## 2024-02-24 ENCOUNTER — Other Ambulatory Visit (HOSPITAL_COMMUNITY): Payer: Self-pay

## 2024-03-01 ENCOUNTER — Other Ambulatory Visit (HOSPITAL_COMMUNITY): Payer: Self-pay

## 2024-03-01 ENCOUNTER — Other Ambulatory Visit: Payer: Self-pay

## 2024-03-01 ENCOUNTER — Other Ambulatory Visit: Payer: Self-pay | Admitting: Cardiology

## 2024-03-03 ENCOUNTER — Other Ambulatory Visit: Payer: Self-pay

## 2024-03-03 ENCOUNTER — Other Ambulatory Visit (HOSPITAL_COMMUNITY): Payer: Self-pay

## 2024-03-03 ENCOUNTER — Other Ambulatory Visit: Payer: Self-pay | Admitting: Cardiology

## 2024-03-08 ENCOUNTER — Other Ambulatory Visit (HOSPITAL_COMMUNITY): Payer: Self-pay

## 2024-03-23 ENCOUNTER — Other Ambulatory Visit (HOSPITAL_COMMUNITY): Payer: Self-pay

## 2024-03-23 DIAGNOSIS — Z6379 Other stressful life events affecting family and household: Secondary | ICD-10-CM | POA: Diagnosis not present

## 2024-03-24 ENCOUNTER — Other Ambulatory Visit (HOSPITAL_COMMUNITY): Payer: Self-pay

## 2024-03-24 ENCOUNTER — Other Ambulatory Visit: Payer: Self-pay | Admitting: Medical Genetics

## 2024-03-24 ENCOUNTER — Other Ambulatory Visit: Payer: Self-pay

## 2024-03-24 ENCOUNTER — Other Ambulatory Visit: Payer: Self-pay | Admitting: Cardiology

## 2024-03-24 DIAGNOSIS — Z006 Encounter for examination for normal comparison and control in clinical research program: Secondary | ICD-10-CM

## 2024-03-24 MED FILL — Rosuvastatin Calcium Tab 40 MG: ORAL | 90 days supply | Qty: 90 | Fill #0 | Status: AC

## 2024-03-25 ENCOUNTER — Other Ambulatory Visit (HOSPITAL_COMMUNITY): Payer: Self-pay

## 2024-03-27 ENCOUNTER — Other Ambulatory Visit: Payer: Self-pay

## 2024-03-28 ENCOUNTER — Other Ambulatory Visit: Payer: Self-pay

## 2024-04-10 ENCOUNTER — Other Ambulatory Visit (HOSPITAL_COMMUNITY): Payer: Self-pay

## 2024-04-10 ENCOUNTER — Other Ambulatory Visit: Payer: Self-pay | Admitting: Cardiology

## 2024-04-10 ENCOUNTER — Other Ambulatory Visit: Payer: Self-pay

## 2024-04-11 ENCOUNTER — Other Ambulatory Visit: Payer: Self-pay

## 2024-04-11 ENCOUNTER — Other Ambulatory Visit (HOSPITAL_COMMUNITY): Payer: Self-pay

## 2024-04-11 ENCOUNTER — Other Ambulatory Visit: Payer: Self-pay | Admitting: Pharmacist

## 2024-04-11 DIAGNOSIS — B20 Human immunodeficiency virus [HIV] disease: Secondary | ICD-10-CM

## 2024-04-11 MED ORDER — CABOTEGRAVIR & RILPIVIRINE ER 600 & 900 MG/3ML IM SUER
1.0000 | INTRAMUSCULAR | 5 refills | Status: AC
  Filled 2024-04-11: qty 6, 30d supply, fill #0
  Filled 2024-06-05: qty 6, 30d supply, fill #1

## 2024-04-11 MED ORDER — INSUPEN PEN NEEDLES 31G X 8 MM MISC
12 refills | Status: AC
  Filled 2024-04-11: qty 100, 90d supply, fill #0
  Filled 2024-05-03: qty 15000, 30d supply, fill #0
  Filled 2024-06-18: qty 300, 90d supply, fill #1

## 2024-04-11 NOTE — Progress Notes (Signed)
 Specialty Pharmacy Refill Coordination Note  [name] is a 58 y.o. male assessed today regarding refills of clinic administered specialty medication(s) Cabotegravir  & Rilpivirine  (CABENUVA )   Clinic requested Courier to Provider Office   Delivery date: 04/19/24   Verified address: [address] [place] [address]   Medication will be filled on 04/18/24.

## 2024-04-12 ENCOUNTER — Other Ambulatory Visit (HOSPITAL_COMMUNITY): Payer: Self-pay

## 2024-04-13 ENCOUNTER — Other Ambulatory Visit: Payer: Self-pay

## 2024-04-14 ENCOUNTER — Other Ambulatory Visit: Payer: Self-pay

## 2024-04-17 ENCOUNTER — Ambulatory Visit: Admitting: Family Medicine

## 2024-04-17 ENCOUNTER — Other Ambulatory Visit (HOSPITAL_COMMUNITY): Payer: Self-pay

## 2024-04-17 ENCOUNTER — Encounter: Payer: Self-pay | Admitting: Family Medicine

## 2024-04-17 ENCOUNTER — Other Ambulatory Visit: Payer: Self-pay

## 2024-04-17 VITALS — BP 134/82 | HR 111 | Temp 97.8°F | Ht 66.0 in | Wt 177.2 lb

## 2024-04-17 DIAGNOSIS — F4320 Adjustment disorder, unspecified: Secondary | ICD-10-CM

## 2024-04-17 DIAGNOSIS — E1165 Type 2 diabetes mellitus with hyperglycemia: Secondary | ICD-10-CM | POA: Diagnosis not present

## 2024-04-17 DIAGNOSIS — R1310 Dysphagia, unspecified: Secondary | ICD-10-CM | POA: Diagnosis not present

## 2024-04-17 DIAGNOSIS — F5104 Psychophysiologic insomnia: Secondary | ICD-10-CM

## 2024-04-17 MED ORDER — ZOLPIDEM TARTRATE 10 MG PO TABS
10.0000 mg | ORAL_TABLET | Freq: Every evening | ORAL | 0 refills | Status: AC | PRN
  Filled 2024-04-17: qty 15, 30d supply, fill #0

## 2024-04-17 MED ORDER — LORAZEPAM 0.5 MG PO TABS
0.5000 mg | ORAL_TABLET | Freq: Two times a day (BID) | ORAL | 0 refills | Status: AC | PRN
  Filled 2024-04-17: qty 20, 10d supply, fill #0

## 2024-04-17 NOTE — Progress Notes (Signed)
 Assessment and Plan Assessment & Plan Grief reaction Significant grief impacting daily functioning and emotional well-being. - Prescribed lorazepam  for anxiety and grief-related symptoms. To be use prn, sparingly. Not long term. - Encouraged self-care and time for grieving.  Type 2 diabetes mellitus Blood glucose fluctuates significantly, likely exacerbated by stress and recent life events. - Provided guidance on managing stress-related glucose fluctuations. - Continue current medication.  - Will review CGM data.   Dysphagia Sensation of food stuck in throat, possibly stress-related. - Consider referral to GI for EGD.  Insomnia Difficulty sleeping likely related to stress and grief. - Okay refill Ambien . Do not take with Lorazepam .   I personally spent a total of 40 minutes in the care of the patient today including preparing to see the patient, counseling and educating, placing orders, and documenting clinical information in the EHR.  Geni Shutter, DO, MS, FAAFP, Dipl. [facility] [address] [place], [address] Dept: [phone] Dept Fax: [phone]  Subjective:   Patient is well known to me from my previous clinic and is now establishing care with me as their primary care provider. Discussed the use of AI scribe software for clinical note transcription with the patient, who gave verbal consent to proceed. History of Present Illness [name] is a 58 year old male who presents with difficulty eating and sleeping following the recent death of his mother.  Dysphagia and gag reflex - Significant difficulty eating with sensation of food 'stuck' in the throat - Episodes of gagging requiring trips to the bathroom to relieve the sensation  Glycemic variability - Diabetes mellitus with self-monitoring of blood glucose - Marked fluctuations in blood sugar levels - Low blood glucose readings in the morning - Elevated  blood glucose readings later in the day despite not eating - Possible association with increased stress  Sleep disturbance - Difficulty initiating and maintaining sleep - Frequent nocturnal awakenings - No current use of sleep medications, though previously used Ambien   Emotional distress and anxiety - Significant emotional distress and stress following recent death of his mother - Describes nerves as 'shocked' - Managing responsibilities related to his mother's estate and funeral arrangements, contributing to stress - No current use of anxiolytic medications, though previously used Xanax  All past medical history, surgical history, allergies, family history, immunizations andmedications were updated in the EMR today and reviewed under the history and medication portions of their EMR. Review of Systems: Negative, with the exception of above mentioned in HPI.  Current Outpatient Medications:    aspirin  81 MG tablet, Take 1 tablet (81 mg total) by mouth daily with breakfast., Disp: 30 tablet, Rfl: 3   baclofen  (LIORESAL ) 10 MG tablet, Take 1 tablet (10 mg total) by mouth at bedtime as needed., Disp: 30 tablet, Rfl: 0   cabotegravir  & rilpivirine  ER (CABENUVA ) 600 & 900 MG/3ML injection, Inject 1 kit into the muscle every 2 (two) months., Disp: 6 mL, Rfl: 5   chlorthalidone  (HYGROTON ) 25 MG tablet, Take 1 tablet (25 mg total) by mouth daily., Disp: 90 tablet, Rfl: 5   Continuous Glucose Sensor (FREESTYLE LIBRE 14 DAY SENSOR) MISC, Use as directed for continuous glucose monitoring. Change every 14 days, Disp: 2 each, Rfl: 12   Continuous Glucose Sensor (FREESTYLE LIBRE 3 PLUS SENSOR) MISC, Use as directed, Disp: 6 each, Rfl: 4   cyanocobalamin  (VITAMIN B12) 1000 MCG/ML injection, Inject 1ml ( ) into the muscle once every 30 days, Disp: 1 mL, Rfl:  2   ergocalciferol  (VITAMIN D2) 1.25 MG (50000 UT) capsule, Take 1 capsule by mouth once weekly, Disp: 4 capsule, Rfl: 0   escitalopram  (LEXAPRO )  10 MG tablet, Take 1 tablet (10 mg total) by mouth daily., Disp: 90 tablet, Rfl: 3   escitalopram  (LEXAPRO ) 20 MG tablet, Take 1 tablet (20 mg total) by mouth daily., Disp: 30 tablet, Rfl: 3   ezetimibe  (ZETIA ) 10 MG tablet, Take 1 tablet (10 mg total) by mouth daily., Disp: 90 tablet, Rfl: 3   insulin  degludec (TRESIBA  FLEXTOUCH) 100 UNIT/ML FlexTouch Pen, Inject 45 Units into the skin 2 (two) times daily., Disp: 30 mL, Rfl: 12   Insulin  Glargine (BASAGLAR  KWIKPEN) 100 UNIT/ML, Inject 45 Units into the skin 2 (two) times daily., Disp: , Rfl:    insulin  lispro (HUMALOG ) 100 UNIT/ML KwikPen, Inject 35 Units into the skin 4 (four) times daily., Disp: 90 mL, Rfl: 4   Insulin  Pen Needle (INSUPEN PEN NEEDLES) 31G X 8 MM MISC, Use as directed, Disp: 15000 each, Rfl: 12   Insulin  Pen Needle (UNIFINE PENTIPS) 31G X 5 MM MISC, Use as directed, Disp: 150 each, Rfl: 4   Insulin  Pen Needle 31G X 5 MM MISC, Use as directed, Disp: 150 each, Rfl: 4   lisinopril  (ZESTRIL ) 10 MG tablet, Take 1 tablet (10 mg total) by mouth daily. PLEASE CALL OFFICE AND SCHEDULE  OVERDUE APPOINTMENT WITH BRITTANY STRADER FOR FUTHER REFILLS [phone]- 2nd ATTEMPT, Disp: 7 tablet, Rfl: 0   LORazepam  (ATIVAN ) 0.5 MG tablet, Take 1 tablet (0.5 mg total) by mouth 2 (two) times daily as needed., Disp: 30 tablet, Rfl: 3   mupirocin  ointment (BACTROBAN ) 2 %, Apply topically twice daily, Disp: 22 g, Rfl: 2   propranolol  (INDERAL ) 20 MG tablet, Take 1 tablet (20 mg total) by mouth 3 (three) times daily as needed., Disp: 90 tablet, Rfl: 3   propranolol  (INDERAL ) 20 MG tablet, Take 1 tablet (20 mg total) by mouth 3 (three) times daily as needed., Disp: 90 tablet, Rfl: 0   rosuvastatin  (CRESTOR ) 40 MG tablet, Take 1 tablet (40 mg total) by mouth daily.PLEASE CALL OFFICE AND SCHEDULE  OVERDUE APPOINTMENT WITH BRITTANY STRADER FOR FUTHER REFILLS [phone]- 2nd ATTEMPT, Disp: 90 tablet, Rfl: 0   SYRINGE-NEEDLE, DISP, 3 ML 25G X 1 3 ML MISC, Use  to inject Vitamin B12 into the muscle once a month, Disp: 10 each, Rfl: 0   tirzepatide  (MOUNJARO ) 12.5 MG/0.5ML Pen, Inject 12.5 mg into the skin once a week., Disp: 2 mL, Rfl: 1   tirzepatide  (MOUNJARO ) 12.5 MG/0.5ML Pen, Inject 12.5 mg into the skin once a week., Disp: 6 mL, Rfl: 1   tirzepatide  (MOUNJARO ) 15 MG/0.5ML Pen, Inject 15 mg into the skin once a week., Disp: 2 mL, Rfl: 0   tirzepatide  (MOUNJARO ) 15 MG/0.5ML Pen, Inject 15 mg into the skin once a week., Disp: 2 mL, Rfl: 3   zolpidem  (AMBIEN ) 10 MG tablet, Take 1 tablet (10 mg total) by mouth at bedtime as needed for sleep. 30 day prescription., Disp: 15 tablet, Rfl: 0   Objective:   There were no vitals taken for this visit.  Wt Readings from Last 3 Encounters:  12/14/23 189 lb (85.7 kg)  06/14/23 196 lb (88.9 kg)  02/15/23 191 lb 4.8 oz (86.8 kg)    Physical Exam  Lab Results  Component Value Date   CREATININE 0.87 12/14/2023   BUN 25 12/14/2023   NA 139 12/14/2023   K 4.0 12/14/2023  CL 104 12/14/2023   CO2 22 12/14/2023   Lab Results  Component Value Date   ALT 63 (H) 12/14/2023   AST 31 12/14/2023   ALKPHOS 60 06/23/2022   BILITOT 0.5 12/14/2023   Lab Results  Component Value Date   HGBA1C 9.1 (H) 04/21/2021   HGBA1C 9.2 (H) 09/23/2020   HGBA1C 8.8 (H) 01/23/2020   HGBA1C 8.0 (H) 09/26/2019   HGBA1C 9.6 (H) 06/27/2019   Lab Results  Component Value Date   INSULIN  2.2 (L) 09/23/2020   Lab Results  Component Value Date   TSH 1.620 04/21/2021   Lab Results  Component Value Date   CHOL 249 (H) 12/14/2023   HDL 60 12/14/2023   LDLCALC 155 (H) 12/14/2023   TRIG 206 (H) 12/14/2023   CHOLHDL 4.2 12/14/2023   Lab Results  Component Value Date   VD25OH 49.9 04/21/2021   VD25OH 27.4 (L) 09/23/2020   VD25OH 24.8 (L) 01/23/2020   Lab Results  Component Value Date   WBC 7.9 12/14/2023   HGB 14.6 12/14/2023   HCT 45.6 12/14/2023   MCV 89.4 12/14/2023   PLT 319 12/14/2023   No results found  for: IRON, TIBC, FERRITIN

## 2024-04-18 ENCOUNTER — Other Ambulatory Visit (HOSPITAL_COMMUNITY): Payer: Self-pay

## 2024-04-18 ENCOUNTER — Other Ambulatory Visit: Payer: Self-pay

## 2024-04-19 ENCOUNTER — Telehealth: Payer: Self-pay

## 2024-04-19 NOTE — Telephone Encounter (Signed)
 [facility] Patient Advocate Encounter  Patient's medications Cabenuva  have been couriered to [facility] from [facility] and will be administered at the patients appointment on 04/24/24.  Arland Hutchinson, CPhT Specialty Pharmacy Patient [facility] for Infect[facility] Disease Phone: [phone] Fax:  [phone]

## 2024-04-21 NOTE — Progress Notes (Unsigned)
 HPI: [name] is a 58 y.o. male who presents to the [facility] pharmacy clinic for Cabenuva  administration.  Referring ID Provider: Dr. Fleeta Rothman   Patient Active Problem List   Diagnosis Date Noted   Vaccine counseling 06/14/2022   Thrombocytosis 05/28/2021   DKA (diabetic ketoacidosis) (HCC) 03/05/2020   High anion gap metabolic acidosis 03/05/2020   Leukocytosis 03/05/2020   Dehydration 03/05/2020   Hyponatremia 03/05/2020   Hyperkalemia 03/05/2020   Hyperglycemia due to diabetes mellitus (HCC) 03/05/2020   Intractable nausea and vomiting 03/05/2020   DKA, type 2 (HCC) 03/05/2020   Other fatigue 03/14/2018   Shortness of breath on exertion 03/14/2018   Uncontrolled type 2 diabetes mellitus with hyperglycemia (HCC) 02/08/2018   Mixed hyperlipidemia 02/08/2018   AKI (acute kidney injury) 03/23/2017   Special screening for malignant neoplasms, colon 11/18/2016   Family history of colon cancer 11/18/2016   Lung nodule, multiple 09/27/2014   Poorly controlled type 2 diabetes mellitus with circulatory disorder (HCC) 09/27/2014   Early syphilis, 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   CABOTEGRAVIR  & RILPIVIRINE  ER (CABENUVA ) 600 & 900 MG/3ML INJECTION    Inject 1 kit into the muscle every 2 (two) months.   CHLORTHALIDONE  (HYGROTON ) 25 MG TABLET    Take 1 tablet (25 mg total) by mouth daily.   CONTINUOUS GLUCOSE SENSOR (FREESTYLE LIBRE 3 PLUS SENSOR) MISC    Use as directed    ESCITALOPRAM  (LEXAPRO ) 10 MG TABLET    Take 1 tablet (10 mg total) by mouth daily.   EZETIMIBE  (ZETIA ) 10 MG TABLET    Take 1 tablet (10 mg total) by mouth daily.   INSULIN  DEGLUDEC (TRESIBA  FLEXTOUCH) 100 UNIT/ML FLEXTOUCH PEN    Inject 45 Units into the skin 2 (two) times daily.   INSULIN  GLARGINE (BASAGLAR  KWIKPEN) 100 UNIT/ML    Inject 45 Units into the skin 2 (two) times daily.   INSULIN  LISPRO (HUMALOG ) 100 UNIT/ML KWIKPEN    Inject 35 Units into the skin 4 (four) times daily.   INSULIN  PEN NEEDLE (INSUPEN PEN NEEDLES) 31G X 8 MM MISC    Use as directed   INSULIN  PEN NEEDLE (UNIFINE PENTIPS) 31G X 5 MM MISC    Use as directed   INSULIN  PEN NEEDLE 31G X 5 MM MISC    Use as directed   LISINOPRIL  (ZESTRIL ) 10 MG TABLET    Take 1 tablet (10 mg total) by mouth daily. PLEASE CALL OFFICE AND SCHEDULE  OVERDUE APPOINTMENT WITH BRITTANY STRADER FOR FUTHER REFILLS [phone]- 2nd ATTEMPT   LORAZEPAM  (ATIVAN ) 0.5 MG TABLET    Take 1 tablet (0.5 mg total) by mouth 2 (two) times daily as needed.   METOPROLOL  SUCCINATE (TOPROL -XL) 100 MG 24 HR TABLET    Take 100 mg by mouth daily.   ROSUVASTATIN  (CRESTOR ) 40 MG TABLET    Take 1 tablet (40 mg total) by mouth daily.PLEASE CALL OFFICE AND SCHEDULE  OVERDUE APPOINTMENT  WITH BRITTANY STRADER FOR FUTHER REFILLS [phone]- 2nd ATTEMPT   SYRINGE-NEEDLE, DISP, 3 ML 25G X 1 3 ML MISC    Use to inject Vitamin B12 into the muscle once a month   TIRZEPATIDE  (MOUNJARO ) 15 MG/0.5ML PEN    Inject 15 mg into the skin once a week.   ZOLPIDEM  (AMBIEN ) 10 MG TABLET    Take 1 tablet (10 mg total) by mouth at bedtime as needed for sleep. 30 day prescription.  Modified Medications   No medications on file  Discontinued Medications   No medications on file    Allergies: Allergies  Allergen Reactions   Famotidine Other (See Comments)    Contraindicated with ODEFSEY  Other reaction(s): Other (See Comments) Contraindicated with ODEFSEY    Prilosec [Omeprazole] Other  (See Comments)    Contraindicated with RPV (lowers level of this ARV in ODEFSEY    Odefsey  [Emtricitabine -Rilpivirine -Tenofovir  Af]     Makes blood sugars run high    Labs: Lab Results  Component Value Date   HIV1RNAQUANT NOT DETECTED 12/14/2023   HIV1RNAQUANT <20 DETECTED (A) 06/14/2023   HIV1RNAQUANT <20 DETECTED (A) 02/15/2023   CD4TABS 1,041 12/14/2023   CD4TABS 938 06/14/2023   CD4TABS 1,068 10/15/2022    RPR and STI Lab Results  Component Value Date   LABRPR NON-REACTIVE 12/14/2023   LABRPR NON-REACTIVE 06/14/2023   LABRPR NON-REACTIVE 10/15/2022   LABRPR NON-REACTIVE 08/18/2021   LABRPR NON-REACTIVE 03/18/2020    STI Results GC CT  12/14/2023  9:37 AM Negative  Negative   06/14/2023 10:51 AM Negative  Negative   10/15/2022  9:31 AM Negative  Negative   08/18/2021  9:21 AM Negative    Negative    Negative  Negative    Negative    Negative   02/22/2018 12:00 AM Negative  Negative   11/30/2016 12:00 AM Negative  Negative   10/28/2015 12:00 AM Negative  Negative   09/13/2014 12:00 AM Negative  Negative     Hepatitis B Lab Results  Component Value Date   HEPBSAB REACTIVE (A) 08/18/2021   HEPBSAG NON-REACTIVE 08/18/2021   Hepatitis C Lab Results  Component Value Date   HEPCAB NON-REACTIVE 10/15/2022   Hepatitis A Lab Results  Component Value Date   HAV NON-REACTIVE 08/18/2021   Lipids: Lab Results  Component Value Date   CHOL 249 (H) 12/14/2023   TRIG 206 (H) 12/14/2023   HDL 60 12/14/2023   CHOLHDL 4.2 12/14/2023   VLDL 31 06/23/2022   LDLCALC 155 (H) 12/14/2023    Target Date: The 25th  Assessment: [name] presents today for his maintenance Cabenuva  injections. Past injections were tolerated well without issues. Last HIV RNA was not detected in July. Due for updated lab work today but wishes to defer until he sees Dr. Fleeta Rothman in January. Of note, he is very tearful and upset today as he lost his mother on 11/11. He was very close to her. Offered  my condolences and asked him to reach out if he needed support.  Lab work:  None today - consider HIV RNA at visit in January  Eligible vaccinations:  Currently up to date on all recommended vaccines.    Cabenuva : Administered cabotegravir  600mg /32mL in left upper outer quadrant of the gluteal muscle. Administered rilpivirine  900 mg/3mL in the right upper outer quadrant of the gluteal muscle. No issues with injections. He will follow up in 2 months for next set of injections.  Plan: - Cabenuva  injections administered - Next injections scheduled for 06/20/24 with Dr.  Fleeta Rothman and 08/14/24 with me - Call with any issues or questions  Shela Esses L. Kalel Harty, PharmD, BCIDP, AAHIVP, CPP Clinical Pharmacist Practitioner - Infectious Diseases Clinical Pharmacist Lead - Specialty Pharmacy [facility] for Infectious Disease

## 2024-04-24 ENCOUNTER — Other Ambulatory Visit: Payer: Self-pay

## 2024-04-24 ENCOUNTER — Ambulatory Visit: Admitting: Pharmacist

## 2024-04-24 DIAGNOSIS — B20 Human immunodeficiency virus [HIV] disease: Secondary | ICD-10-CM

## 2024-04-24 MED ORDER — CABOTEGRAVIR & RILPIVIRINE ER 600 & 900 MG/3ML IM SUER
1.0000 | Freq: Once | INTRAMUSCULAR | Status: AC
  Administered 2024-04-24: 1 via INTRAMUSCULAR

## 2024-04-26 ENCOUNTER — Ambulatory Visit: Admitting: Family Medicine

## 2024-05-03 ENCOUNTER — Other Ambulatory Visit: Payer: Self-pay | Admitting: Cardiology

## 2024-05-03 ENCOUNTER — Other Ambulatory Visit: Payer: Self-pay

## 2024-05-03 ENCOUNTER — Other Ambulatory Visit (HOSPITAL_COMMUNITY): Payer: Self-pay

## 2024-05-04 ENCOUNTER — Other Ambulatory Visit: Payer: Self-pay

## 2024-05-05 ENCOUNTER — Other Ambulatory Visit: Payer: Self-pay

## 2024-05-08 ENCOUNTER — Other Ambulatory Visit: Payer: Self-pay | Admitting: Cardiology

## 2024-05-08 ENCOUNTER — Other Ambulatory Visit: Payer: Self-pay

## 2024-05-11 ENCOUNTER — Encounter (INDEPENDENT_AMBULATORY_CARE_PROVIDER_SITE_OTHER): Payer: Self-pay | Admitting: *Deleted

## 2024-05-12 ENCOUNTER — Other Ambulatory Visit: Payer: Self-pay

## 2024-05-15 ENCOUNTER — Ambulatory Visit: Admitting: Family Medicine

## 2024-05-23 ENCOUNTER — Other Ambulatory Visit (HOSPITAL_COMMUNITY): Payer: Self-pay

## 2024-05-23 ENCOUNTER — Other Ambulatory Visit: Payer: Self-pay

## 2024-05-23 ENCOUNTER — Ambulatory Visit: Admitting: Family Medicine

## 2024-05-23 ENCOUNTER — Encounter: Payer: Self-pay | Admitting: Family Medicine

## 2024-05-23 VITALS — BP 142/80 | HR 92 | Temp 97.8°F | Ht 66.0 in | Wt 176.0 lb

## 2024-05-23 DIAGNOSIS — E663 Overweight: Secondary | ICD-10-CM | POA: Insufficient documentation

## 2024-05-23 DIAGNOSIS — Z6828 Body mass index (BMI) 28.0-28.9, adult: Secondary | ICD-10-CM | POA: Diagnosis not present

## 2024-05-23 DIAGNOSIS — Z7985 Long-term (current) use of injectable non-insulin antidiabetic drugs: Secondary | ICD-10-CM

## 2024-05-23 DIAGNOSIS — F4320 Adjustment disorder, unspecified: Secondary | ICD-10-CM | POA: Diagnosis not present

## 2024-05-23 DIAGNOSIS — E1165 Type 2 diabetes mellitus with hyperglycemia: Secondary | ICD-10-CM | POA: Diagnosis not present

## 2024-05-23 DIAGNOSIS — F4323 Adjustment disorder with mixed anxiety and depressed mood: Secondary | ICD-10-CM | POA: Diagnosis not present

## 2024-05-23 MED ORDER — MOUNJARO 15 MG/0.5ML ~~LOC~~ SOAJ
15.0000 mg | SUBCUTANEOUS | 3 refills | Status: AC
  Filled 2024-05-23: qty 6, 84d supply, fill #0

## 2024-05-23 NOTE — Progress Notes (Signed)
 "    Diagnoses and Orders:   1. Grief reaction   2. Adjustment disorder with mixed anxiety and depressed mood   3. Uncontrolled type 2 diabetes mellitus with hyperglycemia (HCC)   4. Overweight with body mass index (BMI) of 28 to 28.9 in adult    Meds ordered this encounter  Medications   tirzepatide  (MOUNJARO ) 15 MG/0.5ML Pen    Sig: Inject 15 mg into the skin once a week.    Dispense:  6 mL    Refill:  3   No orders of the defined types were placed in this encounter.  Assessment & Plan:   Assessment and Plan Assessment & Plan Grief reaction and adjustment disorder with depressed mood Experiencing significant grief and adjustment disorder with depressed mood following the recent loss of his mother. Reports feeling overwhelmed, unable to focus, and struggling with daily tasks. Expresses a desire to avoid social interactions and work, indicating a need for extended leave from work. Acknowledges the difficulty of the current situation and the need for support. - Referred to therapy for grief counseling and adjustment disorder management. - Advised to avoid returning to work until March, with a print production planner date for return. - Encouraged social interactions with friends and family to aid in adjustment. - Offered to assist with paperwork related to disability leave.  Type 2 diabetes mellitus Diabetes management is currently suboptimal, likely exacerbated by depression and stress. Reports difficulty in managing diabetes due to emotional distress and lack of focus. Has not been receiving Mounjaro  (tirzepatide ) due to a lapse in prescription refills. - Ensured Mounjaro  (tirzepatide ) prescription was refilled and expedited the process. - Scheduled follow-up appointment in one month to review blood glucose levels and diabetes management.  Geni Shutter, DO, MS, FAAFP, Dipl. [facility] [address] [place], [address] Dept:  [phone] Dept Fax: [phone]  Subjective:   History of Present Illness Kyro DARYL BEEHLER is a 58 year old male with depression and diabetes who presents with grief and difficulty managing daily activities following the recent loss of his mother.  Grief and depressive symptoms - Intense grief and worsening depression since his mother's recent death - Pervasive sadness, anhedonia, and low motivation - Overwhelmed by daily tasks and managing his mother's estate - Social withdrawal, preferring to stay home alone with only occasional brief outings - Feels his mother's presence in the house and often replays her last moments, leading to ongoing emotional distress - Poor concentration and memory, frequently forgetting tasks and feeling disoriented - Has not returned to work due to stress and emotional instability - Considering early retirement and pursuing disability benefits  Diabetes management - Off Mounjaro  for 2 weeks due to lack of refills - Poorly controlled diabetes - Concerned about health status - Depression is interfering with diabetes self-care  Social support and isolation - Some support from friends and family, including a niece who visits - Feels isolated in his current living situation - Not engaged in therapy - Acknowledges need for additional support during this period of grief and depression  Review of Systems: Negative, with the exception of above mentioned in HPI.  History:   Reviewed by clinician on day of visit: allergies, medications, problem list, medical history, surgical history, family history, social history, and previous encounter notes.    Medications:   Show/hide medication list[1] Allergies[2]  Objective:   BP (!) 142/80 (BP Location: Left Arm, Patient Position: Sitting, Cuff Size: Large)   Pulse 92  Temp 97.8 F (36.6 C) (Oral)   Ht 5' 6 (1.676 m)   Wt 176 lb (79.8 kg)   SpO2 98%   BMI 28.41 kg/m    Physical Exam Constitutional:       General: He is not in acute distress.    Appearance: He is well-developed.  HENT:     Head: Normocephalic and atraumatic.  Eyes:     Conjunctiva/sclera: Conjunctivae normal.  Cardiovascular:     Rate and Rhythm: Normal rate and regular rhythm.     Heart sounds: Normal heart sounds.  Pulmonary:     Effort: Pulmonary effort is normal.     Breath sounds: Normal breath sounds.  Neurological:     General: No focal deficit present.     Mental Status: He is alert.  Psychiatric:        Mood and Affect: Affect is tearful.        Behavior: Behavior normal.        Attestations:   Reviewed by clinician on day of visit: allergies, medications, problem list, medical history, surgical history, family history, social history, and previous encounter notes.     [1]  Outpatient Medications Prior to Visit  Medication Sig   aspirin  81 MG tablet Take 1 tablet (81 mg total) by mouth daily with breakfast.   cabotegravir  & rilpivirine  ER (CABENUVA ) 600 & 900 MG/3ML injection Inject 1 kit into the muscle every 2 (two) months.   chlorthalidone  (HYGROTON ) 25 MG tablet Take 1 tablet (25 mg total) by mouth daily.   Continuous Glucose Sensor (FREESTYLE LIBRE 3 PLUS SENSOR) MISC Use as directed   escitalopram  (LEXAPRO ) 10 MG tablet Take 1 tablet (10 mg total) by mouth daily.   ezetimibe  (ZETIA ) 10 MG tablet Take 1 tablet (10 mg total) by mouth daily.   insulin  degludec (TRESIBA  FLEXTOUCH) 100 UNIT/ML FlexTouch Pen Inject 45 Units into the skin 2 (two) times daily.   Insulin  Glargine (BASAGLAR  KWIKPEN) 100 UNIT/ML Inject 45 Units into the skin 2 (two) times daily.   insulin  lispro (HUMALOG ) 100 UNIT/ML KwikPen Inject 35 Units into the skin 4 (four) times daily.   Insulin  Pen Needle (INSUPEN PEN NEEDLES) 31G X 8 MM MISC Use as directed   Insulin  Pen Needle (UNIFINE PENTIPS) 31G X 5 MM MISC Use as directed   Insulin  Pen Needle 31G X 5 MM MISC Use as directed   lisinopril  (ZESTRIL ) 10 MG tablet Take 1  tablet (10 mg total) by mouth daily. PLEASE CALL OFFICE AND SCHEDULE  OVERDUE APPOINTMENT WITH BRITTANY STRADER FOR FUTHER REFILLS [phone]- 2nd ATTEMPT   LORazepam  (ATIVAN ) 0.5 MG tablet Take 1 tablet (0.5 mg total) by mouth 2 (two) times daily as needed.   metoprolol  succinate (TOPROL -XL) 100 MG 24 hr tablet Take 100 mg by mouth daily.   rosuvastatin  (CRESTOR ) 40 MG tablet Take 1 tablet (40 mg total) by mouth daily.PLEASE CALL OFFICE AND SCHEDULE  OVERDUE APPOINTMENT WITH BRITTANY STRADER FOR FUTHER REFILLS [phone]- 2nd ATTEMPT   SYRINGE-NEEDLE, DISP, 3 ML 25G X 1 3 ML MISC Use to inject Vitamin B12 into the muscle once a month   zolpidem  (AMBIEN ) 10 MG tablet Take 1 tablet (10 mg total) by mouth at bedtime as needed for sleep. 30 day prescription.   [DISCONTINUED] tirzepatide  (MOUNJARO ) 15 MG/0.5ML Pen Inject 15 mg into the skin once a week.   No facility-administered medications prior to visit.  [2]  Allergies Allergen Reactions   Famotidine Other (See Comments)  Contraindicated with ODEFSEY  Other reaction(s): Other (See Comments) Contraindicated with ODEFSEY    Prilosec [Omeprazole] Other (See Comments)    Contraindicated with RPV (lowers level of this ARV in ODEFSEY    Odefsey  [Emtricitabine -Rilpivirine -Tenofovir  Af]     Makes blood sugars run high   "

## 2024-05-24 ENCOUNTER — Other Ambulatory Visit: Payer: Self-pay | Admitting: Cardiology

## 2024-05-24 ENCOUNTER — Other Ambulatory Visit: Payer: Self-pay

## 2024-06-01 ENCOUNTER — Telehealth: Payer: Self-pay | Admitting: Family Medicine

## 2024-06-01 NOTE — Telephone Encounter (Signed)
 Patient dropped off document the [employer] disability, to be filled out by provider. Patient requested to send it back via Fax within 7-days. Document is located in providers tray at front office.Please advise at [phone]  Disability form came through fax. I put in the dr box

## 2024-06-02 NOTE — Telephone Encounter (Signed)
CLINICAL USE BELOW THIS LINE (use X to signify taken)  __X__Form received and placed in providers office for signature. ____Form completed and faxed to LOA Dept. ____Form completed & LVM to notify pt ready for pick up ____Charge sheet & copy of form in front office folder for office supervisor.   

## 2024-06-05 ENCOUNTER — Other Ambulatory Visit (HOSPITAL_COMMUNITY): Payer: Self-pay

## 2024-06-05 ENCOUNTER — Other Ambulatory Visit: Payer: Self-pay

## 2024-06-05 NOTE — Progress Notes (Signed)
 Specialty Pharmacy Refill Coordination Note  [name] is a 59 y.o. male assessed today regarding refills of clinic administered specialty medication(s) Cabotegravir  & Rilpivirine  (CABENUVA )   Clinic requested Courier to Provider Office   Delivery date: 06/12/24   Verified address: [address] [place] [address]   Medication will be filled on 06/09/24.

## 2024-06-09 ENCOUNTER — Other Ambulatory Visit: Payer: Self-pay

## 2024-06-12 ENCOUNTER — Telehealth: Payer: Self-pay

## 2024-06-12 NOTE — Telephone Encounter (Signed)
 [facility] Patient Advocate Encounter  Patient's medications Cabenuva  have been couriered to [facility] from [facility] and will be administered at the patients appointment on 06/20/24.  Arland Hutchinson, CPhT Specialty Pharmacy Patient [facility] for Infectious Disease Phone: [phone] Fax:  [phone]

## 2024-06-14 ENCOUNTER — Ambulatory Visit: Payer: Self-pay | Admitting: Clinical

## 2024-06-14 DIAGNOSIS — F4321 Adjustment disorder with depressed mood: Secondary | ICD-10-CM | POA: Diagnosis not present

## 2024-06-14 NOTE — Progress Notes (Signed)
 Time: 11:00am-12:00pm Diagnosis: Adjustment Disorder with Depression CPT Code: [phone]  [name] was seen in person for individual therapy. Following verbal review of consent forms, therapist engaged him in an intake assessment. Suicidal ideation and intent were denied. He is scheduled to be seen again on 2/6, with the plan to complete the Treatment Plan at that time.  Intake Presenting Problem [name] reported that his mother passed away on 04-24-2025. He had been caring for her full time for 18 months prior, and closely involved in her care for 2-3 years. He sold his house of 30 years 11/1 to move in and care for her full time.  Symptoms Tearfulness, a feeling of indifference, lack of motivation Suicidal ideation and intent were denied, but described a feeling of not wanting to go on Loss of pleasure in previously enjoyed activities  History of Problem  [name]'s mother passed away from dementia in 2024-05-08. She was first diagnosed in Jul 08, 2021. On 09/13/22, [name]'s sister died suddenly of a heart attack. His mother's health declined steeply from this point. [name] reported that he and his sister were estranged due to vocal intolerance of his sexuality. [name] reported that he never got along with his father, who had a strong preference for his sister. [name] reported that he broke up with a long-term partner roughly 20 years prior and experienced depression following the break-up. He continued attending family functions after the falling out in 09-Jul-1999, but did not engage with his sister or her family.  Recent Trigger  [name]'s mother passed away in May 08, 2024 following a battle with dementia.  Marital and Family Information  Present family concerns/problems: [name] reported that he now only has one living biological family member, a niece with whom he had a falling out in Jul 09, 1999. [name] described several close friendships at present. He reported that he becomes very anxious in social situations and tends to avoid  interactions with people who are not in his close social circle. He reported that his previous long-term relationship helped him to socialize more, but he has not dated seriously since that time.  Strengths/resources in the family/friends: [name] reported strong relationships with several close friends.  Family of Origin  Problems in family of origin: [name] described his biological father as an alcoholic with whom he has never been close. However, his father was understanding when [name] came out. [name]'s father passed away in 1995/07/09 following multiple heart attacks.  Family background / ethnic factors: None  No needs/concerns related to ethnicity reported when asked: No  Education/Vocation  Interpersonal concerns/problems: None. Some social anxiety with unfamiliar others.  Personal strengths: Struggled to identify strengths.  Military/work problems/concerns: [name] has been out of work since October 2025 due to caring for and then grieving his mother. [name] has not received a paycheck in that time.  Leisure Activities/Daily Functioning   Legal Status  No Legal Problems  Medical/Nutritional Concerns  [name] has had three stints put into his heart for congestive heart failure.  unspecified  Comments: FOLLOW UP RE: MEDICATION Substance use/abuse/dependence  Insulin , Monjaro, medications to manage cardiac symptoms  Comments: FOLLOW UP RE: SUBSTANCE USE Religion/Spirituality  Not reported  Other  General Behavior: cooperative, tearful Attire: appropriate  Gait: normal, Not observed  Motor Activity: normal  Stream of Thought - Productivity: spontaneous  Stream of thought - Progression: normal  Stream of thought - Language: normal  Emotional tone and reactions - Mood: normal  Emotional tone and reactions - Affect: appropriate  Mental trend/Content of thoughts -  Perception: normal  Mental trend/Content of thoughts - Orientation: normal  Mental trend/Content of thoughts - Memory: normal  Mental  trend/Content of thoughts - General knowledge: consistent with education  Insight: good  Judgment: good  Intelligence: average  Mental Status Comment: WNL Diagnostic Summary  Adjustment Disorder with Depression               Andriette LITTIE Ponto, PhD

## 2024-06-15 NOTE — Progress Notes (Unsigned)
 "    Patient Care Team: Prentiss Frieze, DO as PCP - General (Family Medicine) Fleeta Rothman, Jomarie SAILOR, MD as PCP - Infectious Diseases (Infectious Diseases) Debera Jayson MATSU, MD as PCP - Cardiology (Cardiology) Altheimer, Ozell, MD (Endocrinology) Correne Lynwood BRAVO., MD (Infectious Diseases)  Weight Management:   Starting weight: 199 lbs.  Starting date: 02/23/2018.  Current weight:  lbs.  Weight change since last encounter:  lbs.  Weight change since first encounter:  lbs.  Total weight loss percentage: %.  Body Fat Percentage: %.   There is no height or weight on file to calculate BMI.   Resting Metabolic Rate: *** Nutrition Plan: Practicing portion control and making smarter food choices, such as increasing vegetables and decreasing simple carbohydrates.  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  This limits his food choices.  Adherence to Nutrition Plan: fair, pt has moved in with his mother recently. She has food in the house that makes his nutrition plan a little difficult.  Current Anti-Obesity Medication, including off-label: Mounjaro  15 mg .  Recent Physical Activity: walking 30 mins 1-2 days a week .   Diagnoses and Orders:   No diagnosis found. No orders of the defined types were placed in this encounter.  No orders of the defined types were placed in this encounter.  Assessment & Plan:   Assessment and Plan     Frieze Prentiss, DO, MS (Nutrition), FAAFP, Dipl. ABOM Fellow, American Academy of Family Physicians Diplomate, American Board of Obesity Medicine [facility] [address] [place], [place] [address] Dept: [phone] Fax: [phone]  Subjective:     Review of Systems: Negative, with the exception of above mentioned in HPI.  History:   Reviewed by clinician on day of visit: allergies, medications, problem list, medical history, surgical history, family  history, social history, and previous encounter notes.  Medications:   Show/hide medication list[1] Allergies[2]  Objective:   There were no vitals taken for this visit. {Insert last BP/Wt (optional):23777}{See vitals history (optional):1}   Physical Exam {Insert previous labs (optional):23779} {See past labs  Heme  Chem  Endocrine  Serology  Results Review (optional):1}  Results for orders placed or performed in visit on 12/14/23  T-helper cells (CD4) count (not at [facility])   Collection Time: 12/14/23  9:37 AM  Result Value Ref Range   CD4 T Cell Abs 1,041 400 - 1,790 /uL   CD4 % Helper T Cell 47 33 - 65 %  Urine cytology ancillary only   Collection Time: 12/14/23  9:37 AM  Result Value Ref Range   Neisseria Gonorrhea Negative    Chlamydia Negative    Comment Normal Reference Ranger Chlamydia - Negative    Comment      Normal Reference Range Neisseria Gonorrhea - Negative  HIV-1 RNA quant-no reflex-bld   Collection Time: 12/14/23  9:54 AM  Result Value Ref Range   HIV 1 RNA Quant NOT DETECTED NOT DETECTED copies/mL   HIV-1 RNA Quant, Log NOT DETECTED NOT DETECTED Log copies/mL  COMPLETE METABOLIC PANEL WITHOUT GFR   Collection Time: 12/14/23  9:54 AM  Result Value Ref Range   Glucose, Bld 198 (H) 65 - 99 mg/dL   BUN 25 7 - 25 mg/dL   Creat 9.12 9.29 - 8.69 mg/dL   BUN/Creatinine Ratio SEE NOTE: 6 - 22 (calc)   Sodium 139 135 - 146 mmol/L   Potassium  4.0 3.5 - 5.3 mmol/L   Chloride 104 98 - 110 mmol/L   CO2 22 20 - 32 mmol/L   Calcium  9.7 8.6 - 10.3 mg/dL   Total Protein 6.7 6.1 - 8.1 g/dL   Albumin 4.0 3.6 - 5.1 g/dL   Globulin 2.7 1.9 - 3.7 g/dL (calc)   AG Ratio 1.5 1.0 - 2.5 (calc)   Total Bilirubin 0.5 0.2 - 1.2 mg/dL   Alkaline phosphatase (APISO) 108 35 - 144 U/L   AST 31 10 - 35 U/L   ALT 63 (H) 9 - 46 U/L  CBC with Differential/Platelet   Collection Time: 12/14/23  9:54 AM  Result Value Ref Range   WBC 7.9 3.8 - 10.8 Thousand/uL   RBC 5.10 4.20 -  5.80 Million/uL   Hemoglobin 14.6 13.2 - 17.1 g/dL   HCT 54.3 61.4 - 49.9 %   MCV 89.4 80.0 - 100.0 fL   MCH 28.6 27.0 - 33.0 pg   MCHC 32.0 32.0 - 36.0 g/dL   RDW 86.8 88.9 - 84.9 %   Platelets 319 140 - 400 Thousand/uL   MPV 9.0 7.5 - 12.5 fL   Neutro Abs 4,985 1,500 - 7,800 cells/uL   Absolute Lymphocytes 2,228 850 - 3,900 cells/uL   Absolute Monocytes 537 200 - 950 cells/uL   Eosinophils Absolute 103 15 - 500 cells/uL   Basophils Absolute 47 0 - 200 cells/uL   Neutrophils Relative % 63.1 %   Total Lymphocyte 28.2 %   Monocytes Relative 6.8 %   Eosinophils Relative 1.3 %   Basophils Relative 0.6 %  RPR   Collection Time: 12/14/23  9:54 AM  Result Value Ref Range   RPR Ser Ql NON-REACTIVE NON-REACTIVE  Lipid panel   Collection Time: 12/14/23  9:54 AM  Result Value Ref Range   Cholesterol 249 (H) <200 mg/dL   HDL 60 > OR = 40 mg/dL   Triglycerides 793 (H) <150 mg/dL   LDL Cholesterol (Calc) 155 (H) mg/dL (calc)   Total CHOL/HDL Ratio 4.2 <5.0 (calc)   Non-HDL Cholesterol (Calc) 189 (H) <130 mg/dL (calc)    8/77/7973    PHQ2-9 Depression Screening   Little interest or pleasure in doing things    Feeling down, depressed, or hopeless    PHQ-2 - Total Score    Trouble falling or staying asleep, or sleeping too much    Feeling tired or having little energy    Poor appetite or overeating     Feeling bad about yourself - or that you are a failure or have let yourself or your family down    Trouble concentrating on things, such as reading the newspaper or watching television    Moving or speaking so slowly that other people could have noticed.  Or the opposite - being so fidgety or restless that you have been moving around a lot more than usual    Thoughts that you would be better off dead, or hurting yourself in some way    PHQ2-9 Total Score    If you checked off any problems, how difficult have these problems made it for you to do your work, take care of things at home, or  get along with other people    Depression Interventions/Treatment         04/17/2024    8:31 AM  GAD 7 : Generalized Anxiety Score  Nervous, Anxious, on Edge 3   Control/stop worrying 3   Worry too much - different things  3   Trouble relaxing 3   Restless 3   Easily annoyed or irritable 2   Afraid - awful might happen 2   Total GAD 7 Score 19  Anxiety Difficulty Very difficult     Data saved with a previous flowsheet row definition   Attestations:   {EW ATTESTATIONS:34266}  Geni Shutter, DO, MS, FAAFP, Dipl. ABOM [facility] [address] [place], [address] Dept: [phone] Dept Fax: [phone]     [1]  Outpatient Medications Prior to Visit  Medication Sig   aspirin  81 MG tablet Take 1 tablet (81 mg total) by mouth daily with breakfast.   cabotegravir  & rilpivirine  ER (CABENUVA ) 600 & 900 MG/3ML injection Inject 1 kit into the muscle every 2 (two) months.   chlorthalidone  (HYGROTON ) 25 MG tablet Take 1 tablet (25 mg total) by mouth daily.   Continuous Glucose Sensor (FREESTYLE LIBRE 3 PLUS SENSOR) MISC Use as directed   escitalopram  (LEXAPRO ) 10 MG tablet Take 1 tablet (10 mg total) by mouth daily.   ezetimibe  (ZETIA ) 10 MG tablet Take 1 tablet (10 mg total) by mouth daily.   insulin  degludec (TRESIBA  FLEXTOUCH) 100 UNIT/ML FlexTouch Pen Inject 45 Units into the skin 2 (two) times daily.   Insulin  Glargine (BASAGLAR  KWIKPEN) 100 UNIT/ML Inject 45 Units into the skin 2 (two) times daily.   insulin  lispro (HUMALOG ) 100 UNIT/ML KwikPen Inject 35 Units into the skin 4 (four) times daily.   Insulin  Pen Needle (INSUPEN PEN NEEDLES) 31G X 8 MM MISC Use as directed   Insulin  Pen Needle (UNIFINE PENTIPS) 31G X 5 MM MISC Use as directed   Insulin  Pen Needle 31G X 5 MM MISC Use as directed   lisinopril  (ZESTRIL ) 10 MG tablet Take 1 tablet (10 mg total) by mouth daily. PLEASE CALL OFFICE AND SCHEDULE  OVERDUE APPOINTMENT WITH  BRITTANY STRADER FOR FUTHER REFILLS [phone]- 2nd ATTEMPT   LORazepam  (ATIVAN ) 0.5 MG tablet Take 1 tablet (0.5 mg total) by mouth 2 (two) times daily as needed.   metoprolol  succinate (TOPROL -XL) 100 MG 24 hr tablet Take 100 mg by mouth daily.   rosuvastatin  (CRESTOR ) 40 MG tablet Take 1 tablet (40 mg total) by mouth daily.PLEASE CALL OFFICE AND SCHEDULE  OVERDUE APPOINTMENT WITH BRITTANY STRADER FOR FUTHER REFILLS [phone]- 2nd ATTEMPT   SYRINGE-NEEDLE, DISP, 3 ML 25G X 1 3 ML MISC Use to inject Vitamin B12 into the muscle once a month   tirzepatide  (MOUNJARO ) 15 MG/0.5ML Pen Inject 15 mg into the skin once a week.   zolpidem  (AMBIEN ) 10 MG tablet Take 1 tablet (10 mg total) by mouth at bedtime as needed for sleep. 30 day prescription.   No facility-administered medications prior to visit.  [2]  Allergies Allergen Reactions   Famotidine Other (See Comments)    Contraindicated with ODEFSEY  Other reaction(s): Other (See Comments) Contraindicated with ODEFSEY    Prilosec [Omeprazole] Other (See Comments)    Contraindicated with RPV (lowers level of this ARV in ODEFSEY    Odefsey  [Emtricitabine -Rilpivirine -Tenofovir  Af]     Makes blood sugars run high   "

## 2024-06-16 ENCOUNTER — Ambulatory Visit: Payer: Self-pay | Admitting: Family Medicine

## 2024-06-18 ENCOUNTER — Other Ambulatory Visit: Payer: Self-pay | Admitting: Cardiology

## 2024-06-18 ENCOUNTER — Other Ambulatory Visit: Payer: Self-pay

## 2024-06-18 ENCOUNTER — Other Ambulatory Visit (HOSPITAL_COMMUNITY): Payer: Self-pay

## 2024-06-19 ENCOUNTER — Other Ambulatory Visit: Payer: Self-pay

## 2024-06-20 ENCOUNTER — Ambulatory Visit: Payer: Self-pay | Admitting: Pharmacist

## 2024-06-20 ENCOUNTER — Ambulatory Visit: Admitting: Infectious Disease

## 2024-06-20 ENCOUNTER — Other Ambulatory Visit: Payer: Self-pay

## 2024-06-21 ENCOUNTER — Other Ambulatory Visit: Payer: Self-pay

## 2024-06-21 ENCOUNTER — Other Ambulatory Visit (HOSPITAL_COMMUNITY): Payer: Self-pay

## 2024-06-21 ENCOUNTER — Encounter: Payer: Self-pay | Admitting: Pharmacist

## 2024-06-21 MED ORDER — FREESTYLE LIBRE 3 PLUS SENSOR MISC
4 refills | Status: AC
  Filled 2024-06-21: qty 6, 90d supply, fill #0

## 2024-06-21 NOTE — Telephone Encounter (Signed)
"   CLINICAL USE BELOW THIS LINE (use X to signify taken)  __X__Form filled out using OV notes and letter written by provider, and placed in providers office for signature. ____Form completed and faxed to [facility] Dept. ____Form completed & LVM to notify pt ready for pick up ____Charge sheet & copy of form in front office folder for office supervisor.   "

## 2024-06-21 NOTE — Progress Notes (Unsigned)
 "  HPI: [name] is a 59 y.o. male who presents to the [facility] pharmacy clinic for Cabenuva  administration.  Referring ID Provider: Dr. Fleeta Rothman  Patient Active Problem List   Diagnosis Date Noted   Adjustment disorder with mixed anxiety and depressed mood 05/23/2024   Grief reaction 05/23/2024   Overweight with body mass index (BMI) of 28 to 28.9 in adult 05/23/2024   Vaccine counseling 06/14/2022   Thrombocytosis 05/28/2021   DKA (diabetic ketoacidosis) (HCC) 03/05/2020   High anion gap metabolic acidosis 03/05/2020   Leukocytosis 03/05/2020   Dehydration 03/05/2020   Hyponatremia 03/05/2020   Hyperkalemia 03/05/2020   Hyperglycemia due to diabetes mellitus (HCC) 03/05/2020   Intractable nausea and vomiting 03/05/2020   DKA, type 2 (HCC) 03/05/2020   Other fatigue 03/14/2018   Shortness of breath on exertion 03/14/2018   Uncontrolled type 2 diabetes mellitus with hyperglycemia (HCC) 02/08/2018   Mixed hyperlipidemia 02/08/2018   AKI (acute kidney injury) 03/23/2017   Special screening for malignant neoplasms, colon 11/18/2016   Family history of colon cancer 11/18/2016   Lung nodule, multiple 09/27/2014   Poorly controlled type 2 diabetes mellitus with circulatory disorder (HCC) 09/27/2014   Early syphilis, 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   CABOTEGRAVIR  & RILPIVIRINE  ER (CABENUVA ) 600 & 900 MG/3ML INJECTION    Inject 1 kit into the muscle every 2 (two) months.    CHLORTHALIDONE  (HYGROTON ) 25 MG TABLET    Take 1 tablet (25 mg total) by mouth daily.   CONTINUOUS GLUCOSE SENSOR (FREESTYLE LIBRE 3 PLUS SENSOR) MISC    Use as directed, changing the sensor every 15 days.   ESCITALOPRAM  (LEXAPRO ) 10 MG TABLET    Take 1 tablet (10 mg total) by mouth daily.   EZETIMIBE  (ZETIA ) 10 MG TABLET    Take 1 tablet (10 mg total) by mouth daily.   INSULIN  DEGLUDEC (TRESIBA  FLEXTOUCH) 100 UNIT/ML FLEXTOUCH PEN    Inject 45 Units into the skin 2 (two) times daily.   INSULIN  GLARGINE (BASAGLAR  KWIKPEN) 100 UNIT/ML    Inject 45 Units into the skin 2 (two) times daily.   INSULIN  LISPRO (HUMALOG ) 100 UNIT/ML KWIKPEN    Inject 35 Units into the skin 4 (four) times daily.   INSULIN  PEN NEEDLE (INSUPEN PEN NEEDLES) 31G X 8 MM MISC    Use as directed   INSULIN  PEN NEEDLE (UNIFINE PENTIPS) 31G X 5 MM MISC    Use as directed   INSULIN  PEN NEEDLE 31G X 5 MM MISC    Use as directed   LISINOPRIL  (ZESTRIL ) 10 MG TABLET    Take 1 tablet (10 mg total) by mouth daily. PLEASE CALL OFFICE AND SCHEDULE  OVERDUE APPOINTMENT WITH BRITTANY STRADER FOR FUTHER REFILLS [phone]- 2nd ATTEMPT   LORAZEPAM  (ATIVAN ) 0.5 MG TABLET    Take 1 tablet (0.5 mg total) by mouth 2 (two) times daily as needed.   METOPROLOL  SUCCINATE (TOPROL -XL) 100 MG 24  HR TABLET    Take 100 mg by mouth daily.   ROSUVASTATIN  (CRESTOR ) 40 MG TABLET    Take 1 tablet (40 mg total) by mouth daily.PLEASE CALL OFFICE AND SCHEDULE  OVERDUE APPOINTMENT WITH BRITTANY STRADER FOR FUTHER REFILLS [phone]- 2nd ATTEMPT   SYRINGE-NEEDLE, DISP, 3 ML 25G X 1 3 ML MISC    Use to inject Vitamin B12 into the muscle once a month   TIRZEPATIDE  (MOUNJARO ) 15 MG/0.5ML PEN    Inject 15 mg into the skin once a week.   ZOLPIDEM  (AMBIEN ) 10 MG TABLET    Take 1 tablet (10 mg total) by mouth at bedtime as needed for sleep. 30 day prescription.  Modified Medications   No medications on file  Discontinued Medications   No medications on file     Allergies: Allergies[1]  Labs: Lab Results  Component Value Date   HIV1RNAQUANT NOT DETECTED 12/14/2023   HIV1RNAQUANT <20 DETECTED (A) 06/14/2023   HIV1RNAQUANT <20 DETECTED (A) 02/15/2023   CD4TABS 1,041 12/14/2023   CD4TABS 938 06/14/2023   CD4TABS 1,068 10/15/2022    RPR and STI Lab Results  Component Value Date   LABRPR NON-REACTIVE 12/14/2023   LABRPR NON-REACTIVE 06/14/2023   LABRPR NON-REACTIVE 10/15/2022   LABRPR NON-REACTIVE 08/18/2021   LABRPR NON-REACTIVE 03/18/2020    STI Results GC CT  12/14/2023  9:37 AM Negative  Negative   06/14/2023 10:51 AM Negative  Negative   10/15/2022  9:31 AM Negative  Negative   08/18/2021  9:21 AM Negative    Negative    Negative  Negative    Negative    Negative   02/22/2018 12:00 AM Negative  Negative   11/30/2016 12:00 AM Negative  Negative   10/28/2015 12:00 AM Negative  Negative   09/13/2014 12:00 AM Negative  Negative     Hepatitis B Lab Results  Component Value Date   HEPBSAB REACTIVE (A) 08/18/2021   HEPBSAG NON-REACTIVE 08/18/2021   Hepatitis C Lab Results  Component Value Date   HEPCAB NON-REACTIVE 10/15/2022   Hepatitis A Lab Results  Component Value Date   HAV NON-REACTIVE 08/18/2021   Lipids: Lab Results  Component Value Date   CHOL 249 (H) 12/14/2023   TRIG 206 (H) 12/14/2023   HDL 60 12/14/2023   CHOLHDL 4.2 12/14/2023   VLDL 31 06/23/2022   LDLCALC 155 (H) 12/14/2023    Target Date: 25th  Assessment: Wilhelm presents today for his maintenance Cabenuva  injections. Past injections were tolerated well without issues. Last HIV RNA was undetectable in July. Overall doing well today. He slipped on the ice this morning so his knee hurts somewhat. He is thinking about retiring soon so he's not sure what his schedule might look like into this spring and summer.   Lab work:  HIV RNA today  Eligible vaccinations:  Up to date  Cabenuva : Administered cabotegravir  600mg /39mL in left upper  outer quadrant of the gluteal muscle. Administered rilpivirine  900 mg/3mL in the right upper outer quadrant of the gluteal muscle. No issues with injections. Reuel will follow up in 2 months for next set of injections.  Plan: - Cabenuva  injections administered - HIV RNA today - Next injections scheduled for 3/23 with Dr. Fleeta Rothman then 5/19 with Cassie - Call with any issues or questions  Maurilio Patten, PharmD PGY1 Pharmacy Resident [facility]     [1]  Allergies Allergen Reactions   Famotidine Other (See Comments)    Contraindicated with ODEFSEY  Other reaction(s): Other (See Comments)  Contraindicated with ODEFSEY    Prilosec [Omeprazole] Other (See Comments)    Contraindicated with RPV (lowers level of this ARV in ODEFSEY    Odefsey  [Emtricitabine -Rilpivirine -Tenofovir  Af]     Makes blood sugars run high   "

## 2024-06-22 ENCOUNTER — Ambulatory Visit: Attending: Nurse Practitioner | Admitting: Nurse Practitioner

## 2024-06-22 ENCOUNTER — Other Ambulatory Visit: Payer: Self-pay

## 2024-06-22 ENCOUNTER — Other Ambulatory Visit (HOSPITAL_COMMUNITY): Payer: Self-pay

## 2024-06-22 ENCOUNTER — Ambulatory Visit: Admitting: Pharmacist

## 2024-06-22 ENCOUNTER — Encounter: Payer: Self-pay | Admitting: Nurse Practitioner

## 2024-06-22 VITALS — BP 138/72 | HR 104 | Ht 66.0 in | Wt 176.4 lb

## 2024-06-22 DIAGNOSIS — R000 Tachycardia, unspecified: Secondary | ICD-10-CM | POA: Diagnosis not present

## 2024-06-22 DIAGNOSIS — I2583 Coronary atherosclerosis due to lipid rich plaque: Secondary | ICD-10-CM | POA: Diagnosis not present

## 2024-06-22 DIAGNOSIS — B20 Human immunodeficiency virus [HIV] disease: Secondary | ICD-10-CM

## 2024-06-22 DIAGNOSIS — Z79899 Other long term (current) drug therapy: Secondary | ICD-10-CM | POA: Diagnosis not present

## 2024-06-22 DIAGNOSIS — I2510 Atherosclerotic heart disease of native coronary artery without angina pectoris: Secondary | ICD-10-CM

## 2024-06-22 DIAGNOSIS — E785 Hyperlipidemia, unspecified: Secondary | ICD-10-CM

## 2024-06-22 DIAGNOSIS — I10 Essential (primary) hypertension: Secondary | ICD-10-CM

## 2024-06-22 MED ORDER — CABOTEGRAVIR & RILPIVIRINE ER 600 & 900 MG/3ML IM SUER
1.0000 | Freq: Once | INTRAMUSCULAR | Status: AC
  Administered 2024-06-22: 1 via INTRAMUSCULAR

## 2024-06-22 MED ORDER — LISINOPRIL 20 MG PO TABS
20.0000 mg | ORAL_TABLET | Freq: Every day | ORAL | 3 refills | Status: AC
  Filled 2024-06-22: qty 90, 90d supply, fill #0

## 2024-06-22 NOTE — Telephone Encounter (Signed)
 Pt called back in today. We spoke regarding what dates he has been out of work. When his leave started and what date his symptoms because severe enough that he needed to take extended leave. I have filled out these dates in his form and will have it faxed. Pt notified.   CLINICAL USE BELOW THIS LINE (use X to signify taken)  ____Form received and placed in providers office for signature. __X__Form completed and faxed to [employer]. 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

## 2024-06-22 NOTE — Patient Instructions (Signed)
 Medication Instructions:   Increase Lisinopril  to 20mg  daily  Continue all other medications.     Labwork:  BMET - order given today Please do in 1-2 weeks  Office will contact with results via phone, letter or mychart.     Testing/Procedures:  none  Follow-Up:  Your physician wants you to follow up in:  1 year.  You should receive a recall letter in the mail about 2 months prior to the time you are due.  If you don't receive this, please call our office to schedule your follow up appointment.      Any Other Special Instructions Will Be Listed Below (If Applicable).   If you need a refill on your cardiac medications before your next appointment, please call your pharmacy.

## 2024-06-22 NOTE — Progress Notes (Signed)
 " Cardiology Office Note   Date:  06/22/2024 ID:  Derrill, Bagnell 03/10/1966, MRN [id number] PCP: Prentiss Frieze, DO  [facility] Providers Cardiologist:  Jayson Sierras, MD     History of Present Illness [name] is a very pleasant 59 y.o. male with a PMH of CAD, HTN, T2DM, HLD, HIV, who presents today for scheduled follow-up.   Past CV hx of DES to LCX in 07-11-2005, ISR and DES to LCX in Jul 12, 2007.   Last seen by Leita Lobstein, NP on 06/23/2022. Denied any CP but admitted to occasional SHOB. Labs were ordered. Was told to f/u in 1 year.   He is here for follow-up. Doing very well. Sadly, he tells me that his mother passed away last 04-10-2025, has been out of work since. Says his HR is up today as he was in a rush to get to the office for his appointment. HR earlier today was fine per his report at previous doctor's visit. Denies any chest pain, shortness of breath, palpitations, syncope, presyncope, dizziness, orthopnea, PND, swelling or significant weight changes, acute bleeding, or claudication.  ROS: Negative. See HPI.   SH: Has worked for [employer] for around 30 years.   FH: Strong FH of CVD. Sister died in Jul 11, 2022 from MI.   Studies Reviewed  EKG: EKG is not ordered today.   Echo 07/12/2011: Study Conclusions   - Left ventricle: The cavity size was normal. Wall thickness    was normal. Systolic function was normal. The estimated    ejection fraction was in the range of 55% to 60%. Wall    motion was normal; there were no regional wall motion    abnormalities. Features are consistent with a pseudonormal    left ventricular filling pattern, with concomitant    abnormal relaxation and increased filling pressure (grade    2 diastolic dysfunction).  - Atrial septum: No defect or patent foramen ovale was    identified.   Physical Exam VS:  BP 138/72   Pulse (!) 104   Ht 5' 6 (1.676 m)   Wt 176 lb 6.4 oz (80 kg)   SpO2 97%   BMI 28.47 kg/m         Wt Readings from Last 3 Encounters:  06/22/24 176 lb 6.4 oz (80 kg)  05/23/24 176 lb (79.8 kg)  04/17/24 177 lb 3.2 oz (80.4 kg)    GEN: Well nourished, well developed in no acute distress NECK: No JVD; No carotid bruits CARDIAC: S1/S2, RRR, no murmurs, rubs, gallops RESPIRATORY:  Clear to auscultation without rales, wheezing or rhonchi  ABDOMEN: Soft, non-tender, non-distended EXTREMITIES:  No edema; No deformity   ASSESSMENT AND PLAN  Tachycardia HR slightly fast today as pt states he was in a rush to get to office visit today. States HR well controlled at previous office visit today. Care and ED precautions discussed. Pt defers 12 lead ECG to future office visit with his PCP. Continue to follow with PCP. Care and ED precautions discussed.   HTN, medication management BP borderline elevated and not at goal. Will increase Lisinopril  to 20 mg daily and obtain BMET in 2 weeks. Continue Chlorthalidone  and Metoprolol . Discussed to monitor BP at home at least 2 hours after medications and sitting for 5-10 minutes. Heart healthy diet and regular cardiovascular exercise encouraged.   CAD Past CV hx of DES to LCX in 07-11-2005, ISR and DES to LCX in 07/12/2007. Stable with no anginal symptoms. No  indication for ischemic evaluation. No medication changes at this time besides what is noted above. Heart healthy diet and regular cardiovascular exercise encouraged.   HLD   LDL 155 from 11/2023. Being followed by new PCP. PCP is managing. Continue current medication regimen. Recommend if no improvement by follow-up to refer to lipid clinic to consider PCSK9i. Heart healthy diet and regular cardiovascular exercise encouraged.   I spent a total duration of 30 minutes reviewing prior notes, reviewing outside records including  labs, face-to-face counseling of medical condition, pathophysiology, evaluation, management, and documenting the findings in the note.    Dispo: Follow-up with MD/APP in 1 year or sooner  if anything changes.   Signed, Almarie Crate, NP   "

## 2024-06-22 NOTE — Progress Notes (Signed)
 [name]

## 2024-06-22 NOTE — Telephone Encounter (Signed)
 I called pt this morning as I have a few questions regarding dates that he has been out of work for his STD paperwork, but there was no answer. I have left a message for him to give me a call back so that we can discuss this.

## 2024-06-24 LAB — HIV-1 RNA QUANT-NO REFLEX-BLD
HIV 1 RNA Quant: NOT DETECTED {copies}/mL
HIV-1 RNA Quant, Log: NOT DETECTED {Log_copies}/mL

## 2024-06-30 ENCOUNTER — Ambulatory Visit: Admitting: Clinical

## 2024-06-30 DIAGNOSIS — F4321 Adjustment disorder with depressed mood: Secondary | ICD-10-CM

## 2024-06-30 NOTE — Progress Notes (Signed)
 Time: 12:00 pm-11:00pm Diagnosis: Adjustment Disorder with Depression CPT Code: [id number]  Chozen was seen in person for individual therapy. Session began by creating a treatment plan for therapy. Dejohn provided input into and verbally consented to all goals and interventions. Session also included processing his grief and developments with family members, and he indicated that for homework he will hang the artwork in his hallway. He is scheduled to be seen again in two weeks.   Treatment Plan Client Abilities/Strengths   [place] is able to articulate his experiences well. He would like for his life situation to improve.  Client Treatment Preferences  Lakota prefers in person appointments but is open to virtual appointments as well.  Client Statement of Needs  Maximilian would like to find himself and improve self-confidence.  Treatment Level  Weekly/Biweekly Symptoms  Lack of motivation, tearfulness, depressed mood, frequent emotional breakdowns Problems Addressed  Orland reported that he is experiencing lack of motivation and avoidance of social situations since his mother passed away.  Goals 1. Dicky would like to improve his overall mood.  Objective Marckus would like to resume staying over at friends and family's homes as he used to prior to taking over full-time care of his mother [employer] Date: 06/30/2025 Frequency: Weekly/Biweekly  Progress: 0 Modality: individual  Related Interventions Therapist will work with Elouise to help him develop a healthy routine, including stable meal and sleep times and regular self-care Therapist will help Taimur to identify and disengage from negative thought patterns using CBT-based strategies Therapist will provide Mario with strategies to help him manage his grief, including breathing exercises, meditation, mindfulness, and self-care Therapist will provide Ozell with referrals for additional resources in the community as appropriate Therapist will provide Travonte  opportunities to process his experiences in session  Intake Presenting Problem Alyce reported that his mother passed away on 2025/04/21. He had been caring for her full time for 18 months prior, and closely involved in her care for 2-3 years. He sold his house of 30 years 11/1 to move in and care for her full time.  Symptoms Tearfulness, a feeling of indifference, lack of motivation Suicidal ideation and intent were denied, but described a feeling of not wanting to go on Loss of pleasure in previously enjoyed activities  History of Problem  Sean's mother passed away from dementia in Apr 19, 2024. She was first diagnosed in 2023. On 2022-09-10, Sean's sister died suddenly of a heart attack. His mother's health declined steeply from this point. Alyce reported that he and his sister were estranged due to vocal intolerance of his sexuality. Alyce reported that he never got along with his father, who had a strong preference for his sister. Alyce reported that he broke up with a long-term partner roughly 20 years prior and experienced depression following the break-up. He continued attending family functions after the falling out in 2001, but did not engage with his sister or her family.  Recent Trigger  Sean's mother passed away in 04-19-24 following a battle with dementia.  Marital and Family Information  Present family concerns/problems: Alyce reported that he now only has one living biological family member, a niece with whom he had a falling out in 2001. Alyce described several close friendships at present. He reported that he becomes very anxious in social situations and tends to avoid interactions with people who are not in his close social circle. He reported that his previous long-term relationship helped him to socialize more, but he has not dated  seriously since that time.  Strengths/resources in the family/friends: Alyce reported strong relationships with several close friends.  Family of Origin   Problems in family of origin: Alyce described his biological father as an alcoholic with whom he has never been close. However, his father was understanding when Alyce came out. Sean's father passed away in 07/21/1995 following multiple heart attacks.  Family background / ethnic factors: None  No needs/concerns related to ethnicity reported when asked: No  Education/Vocation  Interpersonal concerns/problems: None. Some social anxiety with unfamiliar others.  Personal strengths: Struggled to identify strengths.  Military/work problems/concerns: Alyce has been out of work since October 2025 due to caring for and then grieving his mother. Alyce has not received a paycheck in that time.  Leisure Activities/Daily Functioning   Legal Status  No Legal Problems  Medical/Nutritional Concerns  Alyce has had three stints put into his heart for congestive heart failure.  unspecified  Comments: FOLLOW UP RE: MEDICATION Substance use/abuse/dependence  Insulin , Monjaro, medications to manage cardiac symptoms  Comments: FOLLOW UP RE: SUBSTANCE USE Religion/Spirituality  Not reported  Other  General Behavior: cooperative, tearful Attire: appropriate  Gait: normal, Not observed  Motor Activity: normal  Stream of Thought - Productivity: spontaneous  Stream of thought - Progression: normal  Stream of thought - Language: normal  Emotional tone and reactions - Mood: normal  Emotional tone and reactions - Affect: appropriate  Mental trend/Content of thoughts - Perception: normal  Mental trend/Content of thoughts - Orientation: normal  Mental trend/Content of thoughts - Memory: normal  Mental trend/Content of thoughts - General knowledge: consistent with education  Insight: good  Judgment: good  Intelligence: average  Mental Status Comment: WNL Diagnostic Summary  Adjustment Disorder with Depression               Andriette LITTIE Ponto, PhD               Andriette LITTIE Ponto, PhD

## 2024-07-11 ENCOUNTER — Ambulatory Visit: Payer: Self-pay | Admitting: Family Medicine

## 2024-07-17 ENCOUNTER — Ambulatory Visit: Admitting: Clinical

## 2024-07-31 ENCOUNTER — Ambulatory Visit: Admitting: Infectious Disease

## 2024-08-09 ENCOUNTER — Ambulatory Visit: Admitting: Clinical

## 2024-08-14 ENCOUNTER — Ambulatory Visit: Admitting: Pharmacist

## 2024-08-21 ENCOUNTER — Encounter: Payer: Self-pay | Admitting: Infectious Disease

## 2024-10-10 ENCOUNTER — Ambulatory Visit: Admitting: Pharmacist
